# Patient Record
Sex: Male | Born: 1950 | Race: Black or African American | Hispanic: No | Marital: Married | Smoking: Former smoker
Health system: Southern US, Community
[De-identification: ages and names within clinical notes are randomized; demographics above are authoritative.]

## PROBLEM LIST (undated history)

## (undated) DIAGNOSIS — R519 Headache, unspecified: Secondary | ICD-10-CM

## (undated) DIAGNOSIS — M489 Spondylopathy, unspecified: Secondary | ICD-10-CM

## (undated) DIAGNOSIS — K219 Gastro-esophageal reflux disease without esophagitis: Secondary | ICD-10-CM

## (undated) DIAGNOSIS — I219 Acute myocardial infarction, unspecified: Secondary | ICD-10-CM

## (undated) DIAGNOSIS — F329 Major depressive disorder, single episode, unspecified: Secondary | ICD-10-CM

## (undated) DIAGNOSIS — E785 Hyperlipidemia, unspecified: Secondary | ICD-10-CM

## (undated) DIAGNOSIS — I10 Essential (primary) hypertension: Secondary | ICD-10-CM

## (undated) DIAGNOSIS — M1990 Unspecified osteoarthritis, unspecified site: Secondary | ICD-10-CM

## (undated) DIAGNOSIS — I509 Heart failure, unspecified: Secondary | ICD-10-CM

## (undated) DIAGNOSIS — I639 Cerebral infarction, unspecified: Secondary | ICD-10-CM

## (undated) DIAGNOSIS — I739 Peripheral vascular disease, unspecified: Secondary | ICD-10-CM

## (undated) DIAGNOSIS — F32A Depression, unspecified: Secondary | ICD-10-CM

## (undated) HISTORY — DX: Essential (primary) hypertension: I10

## (undated) HISTORY — DX: Gastro-esophageal reflux disease without esophagitis: K21.9

## (undated) HISTORY — DX: Major depressive disorder, single episode, unspecified: F32.9

## (undated) HISTORY — PX: OTHER SURGICAL HISTORY: SHX169

## (undated) HISTORY — DX: Unspecified osteoarthritis, unspecified site: M19.90

## (undated) HISTORY — DX: Heart failure, unspecified: I50.9

## (undated) HISTORY — DX: Hyperlipidemia, unspecified: E78.5

## (undated) HISTORY — DX: Depression, unspecified: F32.A

## (undated) HISTORY — DX: Spondylopathy, unspecified: M48.9

## (undated) HISTORY — PX: CORONARY ARTERY BYPASS GRAFT: SHX141

---

## 2001-03-28 ENCOUNTER — Inpatient Hospital Stay (HOSPITAL_COMMUNITY): Admission: EM | Admit: 2001-03-28 | Discharge: 2001-04-02 | Payer: Self-pay

## 2001-09-12 ENCOUNTER — Emergency Department (HOSPITAL_COMMUNITY): Admission: EM | Admit: 2001-09-12 | Discharge: 2001-09-12 | Payer: Self-pay | Admitting: Emergency Medicine

## 2002-09-12 ENCOUNTER — Encounter: Payer: Self-pay | Admitting: Emergency Medicine

## 2002-09-12 ENCOUNTER — Inpatient Hospital Stay (HOSPITAL_COMMUNITY): Admission: EM | Admit: 2002-09-12 | Discharge: 2002-09-14 | Payer: Self-pay | Admitting: Emergency Medicine

## 2002-09-12 ENCOUNTER — Encounter: Payer: Self-pay | Admitting: Family Medicine

## 2002-09-29 ENCOUNTER — Encounter: Admission: RE | Admit: 2002-09-29 | Discharge: 2002-09-29 | Payer: Self-pay | Admitting: Family Medicine

## 2003-02-24 ENCOUNTER — Emergency Department (HOSPITAL_COMMUNITY): Admission: EM | Admit: 2003-02-24 | Discharge: 2003-02-24 | Payer: Self-pay | Admitting: Emergency Medicine

## 2003-05-16 ENCOUNTER — Encounter: Payer: Self-pay | Admitting: Emergency Medicine

## 2003-05-17 ENCOUNTER — Inpatient Hospital Stay (HOSPITAL_COMMUNITY): Admission: EM | Admit: 2003-05-17 | Discharge: 2003-05-19 | Payer: Self-pay | Admitting: Emergency Medicine

## 2003-05-18 ENCOUNTER — Encounter: Payer: Self-pay | Admitting: Internal Medicine

## 2003-06-28 ENCOUNTER — Emergency Department (HOSPITAL_COMMUNITY): Admission: EM | Admit: 2003-06-28 | Discharge: 2003-06-29 | Payer: Self-pay | Admitting: Emergency Medicine

## 2003-06-29 ENCOUNTER — Encounter: Payer: Self-pay | Admitting: Emergency Medicine

## 2003-07-08 ENCOUNTER — Emergency Department (HOSPITAL_COMMUNITY): Admission: EM | Admit: 2003-07-08 | Discharge: 2003-07-08 | Payer: Self-pay | Admitting: Emergency Medicine

## 2003-07-10 ENCOUNTER — Emergency Department (HOSPITAL_COMMUNITY): Admission: EM | Admit: 2003-07-10 | Discharge: 2003-07-10 | Payer: Self-pay | Admitting: Emergency Medicine

## 2003-08-08 ENCOUNTER — Emergency Department (HOSPITAL_COMMUNITY): Admission: EM | Admit: 2003-08-08 | Discharge: 2003-08-08 | Payer: Self-pay | Admitting: Emergency Medicine

## 2004-02-15 ENCOUNTER — Ambulatory Visit (HOSPITAL_COMMUNITY): Admission: RE | Admit: 2004-02-15 | Discharge: 2004-02-15 | Payer: Self-pay | Admitting: Family Medicine

## 2004-04-26 ENCOUNTER — Ambulatory Visit (HOSPITAL_COMMUNITY): Admission: RE | Admit: 2004-04-26 | Discharge: 2004-04-26 | Payer: Self-pay | Admitting: Family Medicine

## 2004-05-22 ENCOUNTER — Ambulatory Visit: Payer: Self-pay | Admitting: Family Medicine

## 2004-09-26 ENCOUNTER — Encounter: Admission: RE | Admit: 2004-09-26 | Discharge: 2004-09-26 | Payer: Self-pay | Admitting: Cardiovascular Disease

## 2004-09-28 ENCOUNTER — Ambulatory Visit (HOSPITAL_COMMUNITY): Admission: RE | Admit: 2004-09-28 | Discharge: 2004-09-28 | Payer: Self-pay | Admitting: Cardiovascular Disease

## 2004-11-30 ENCOUNTER — Ambulatory Visit (HOSPITAL_COMMUNITY): Admission: RE | Admit: 2004-11-30 | Discharge: 2004-11-30 | Payer: Self-pay | Admitting: Cardiovascular Disease

## 2004-12-17 ENCOUNTER — Encounter: Admission: RE | Admit: 2004-12-17 | Discharge: 2005-01-04 | Payer: Self-pay | Admitting: Pulmonary Disease

## 2005-06-26 ENCOUNTER — Emergency Department (HOSPITAL_COMMUNITY): Admission: EM | Admit: 2005-06-26 | Discharge: 2005-06-26 | Payer: Self-pay | Admitting: Family Medicine

## 2005-09-23 ENCOUNTER — Emergency Department (HOSPITAL_COMMUNITY): Admission: EM | Admit: 2005-09-23 | Discharge: 2005-09-23 | Payer: Self-pay | Admitting: Emergency Medicine

## 2005-09-27 ENCOUNTER — Emergency Department (HOSPITAL_COMMUNITY): Admission: EM | Admit: 2005-09-27 | Discharge: 2005-09-27 | Payer: Self-pay | Admitting: Family Medicine

## 2005-09-30 ENCOUNTER — Emergency Department (HOSPITAL_COMMUNITY): Admission: EM | Admit: 2005-09-30 | Discharge: 2005-09-30 | Payer: Self-pay | Admitting: Family Medicine

## 2006-10-08 ENCOUNTER — Inpatient Hospital Stay (HOSPITAL_COMMUNITY): Admission: EM | Admit: 2006-10-08 | Discharge: 2006-10-10 | Payer: Self-pay | Admitting: Emergency Medicine

## 2006-10-16 ENCOUNTER — Ambulatory Visit (HOSPITAL_COMMUNITY): Admission: RE | Admit: 2006-10-16 | Discharge: 2006-10-16 | Payer: Self-pay | Admitting: Cardiovascular Disease

## 2008-01-10 ENCOUNTER — Encounter: Admission: RE | Admit: 2008-01-10 | Discharge: 2008-01-10 | Payer: Self-pay | Admitting: Sports Medicine

## 2008-05-04 ENCOUNTER — Encounter: Admission: RE | Admit: 2008-05-04 | Discharge: 2008-06-27 | Payer: Self-pay | Admitting: Sports Medicine

## 2008-10-05 ENCOUNTER — Encounter (HOSPITAL_COMMUNITY): Admission: RE | Admit: 2008-10-05 | Discharge: 2008-10-05 | Payer: Self-pay | Admitting: Cardiovascular Disease

## 2008-11-24 ENCOUNTER — Encounter: Admission: RE | Admit: 2008-11-24 | Discharge: 2009-02-22 | Payer: Self-pay | Admitting: Orthopedic Surgery

## 2009-05-29 ENCOUNTER — Emergency Department (HOSPITAL_COMMUNITY): Admission: EM | Admit: 2009-05-29 | Discharge: 2009-05-29 | Payer: Self-pay | Admitting: Emergency Medicine

## 2009-08-11 ENCOUNTER — Emergency Department (HOSPITAL_COMMUNITY): Admission: EM | Admit: 2009-08-11 | Discharge: 2009-08-11 | Payer: Self-pay | Admitting: Emergency Medicine

## 2009-09-04 ENCOUNTER — Ambulatory Visit (HOSPITAL_COMMUNITY): Admission: RE | Admit: 2009-09-04 | Discharge: 2009-09-04 | Payer: Self-pay | Admitting: Cardiovascular Disease

## 2009-09-05 ENCOUNTER — Ambulatory Visit: Payer: Self-pay | Admitting: Thoracic Surgery (Cardiothoracic Vascular Surgery)

## 2009-09-12 ENCOUNTER — Ambulatory Visit: Payer: Self-pay | Admitting: Surgery

## 2009-09-12 ENCOUNTER — Encounter: Payer: Self-pay | Admitting: Thoracic Surgery (Cardiothoracic Vascular Surgery)

## 2009-09-15 ENCOUNTER — Ambulatory Visit: Payer: Self-pay | Admitting: Thoracic Surgery (Cardiothoracic Vascular Surgery)

## 2009-09-15 ENCOUNTER — Inpatient Hospital Stay (HOSPITAL_COMMUNITY)
Admission: RE | Admit: 2009-09-15 | Discharge: 2009-09-19 | Payer: Self-pay | Admitting: Thoracic Surgery (Cardiothoracic Vascular Surgery)

## 2009-09-15 ENCOUNTER — Encounter: Payer: Self-pay | Admitting: Thoracic Surgery (Cardiothoracic Vascular Surgery)

## 2009-10-09 ENCOUNTER — Ambulatory Visit: Payer: Self-pay | Admitting: Thoracic Surgery (Cardiothoracic Vascular Surgery)

## 2009-10-09 ENCOUNTER — Encounter
Admission: RE | Admit: 2009-10-09 | Discharge: 2009-10-09 | Payer: Self-pay | Admitting: Thoracic Surgery (Cardiothoracic Vascular Surgery)

## 2009-10-18 ENCOUNTER — Encounter: Admission: RE | Admit: 2009-10-18 | Discharge: 2009-10-18 | Payer: Self-pay | Admitting: Orthopedic Surgery

## 2009-10-19 ENCOUNTER — Encounter (HOSPITAL_COMMUNITY): Admission: RE | Admit: 2009-10-19 | Discharge: 2010-01-17 | Payer: Self-pay | Admitting: Cardiovascular Disease

## 2010-01-05 ENCOUNTER — Ambulatory Visit (HOSPITAL_COMMUNITY): Admission: AD | Admit: 2010-01-05 | Discharge: 2010-01-05 | Payer: Self-pay | Admitting: Cardiovascular Disease

## 2010-09-21 ENCOUNTER — Emergency Department (HOSPITAL_COMMUNITY)
Admission: EM | Admit: 2010-09-21 | Discharge: 2010-09-21 | Payer: Self-pay | Source: Home / Self Care | Admitting: Emergency Medicine

## 2010-09-24 LAB — POCT CARDIAC MARKERS
CKMB, poc: <1 ng/mL — ABNORMAL LOW (ref 1.0–8.0)
CKMB, poc: <1 ng/mL — ABNORMAL LOW (ref 1.0–8.0)
Myoglobin, poc: 42.1 ng/mL (ref 12–200)
Myoglobin, poc: 55.7 ng/mL (ref 12–200)
Troponin i, poc: <0.05 ng/mL (ref 0.00–0.09)
Troponin i, poc: <0.05 ng/mL (ref 0.00–0.09)

## 2010-09-24 LAB — COMPREHENSIVE METABOLIC PANEL
ALT: 24 U/L (ref 0–53)
AST: 29 U/L (ref 0–37)
Albumin: 3.6 g/dL (ref 3.5–5.2)
Alkaline Phosphatase: 127 U/L — ABNORMAL HIGH (ref 39–117)
BUN: 9 mg/dL (ref 6–23)
CO2: 27 mEq/L (ref 19–32)
Calcium: 9.6 mg/dL (ref 8.4–10.5)
Chloride: 106 mEq/L (ref 96–112)
Creatinine, Ser: 1.47 mg/dL (ref 0.4–1.5)
GFR calc Af Amer: 59 mL/min — ABNORMAL LOW (ref >60)
GFR calc non Af Amer: 49 mL/min — ABNORMAL LOW (ref >60)
Glucose, Bld: 100 mg/dL — ABNORMAL HIGH (ref 70–99)
Potassium: 4.5 mEq/L (ref 3.5–5.1)
Sodium: 140 mEq/L (ref 135–145)
Total Bilirubin: 0.7 mg/dL (ref 0.3–1.2)
Total Protein: 6.2 g/dL (ref 6.0–8.3)

## 2010-09-24 LAB — CBC
HCT: 41 % (ref 39.0–52.0)
Hemoglobin: 12.8 g/dL — ABNORMAL LOW (ref 13.0–17.0)
MCH: 26.4 pg (ref 26.0–34.0)
MCHC: 31.2 g/dL (ref 30.0–36.0)
MCV: 84.5 fL (ref 78.0–100.0)
Platelets: 172 10*3/uL (ref 150–400)
RBC: 4.85 MIL/uL (ref 4.22–5.81)
RDW: 14.4 % (ref 11.5–15.5)
WBC: 4.5 10*3/uL (ref 4.0–10.5)

## 2010-09-29 ENCOUNTER — Encounter: Payer: Self-pay | Admitting: Family Medicine

## 2010-09-30 ENCOUNTER — Encounter: Payer: Self-pay | Admitting: Cardiovascular Disease

## 2010-11-24 LAB — POCT I-STAT 4, (NA,K, GLUC, HGB,HCT)
Glucose, Bld: 109 mg/dL — ABNORMAL HIGH (ref 70–99)
Glucose, Bld: 113 mg/dL — ABNORMAL HIGH (ref 70–99)
Glucose, Bld: 113 mg/dL — ABNORMAL HIGH (ref 70–99)
Glucose, Bld: 125 mg/dL — ABNORMAL HIGH (ref 70–99)
Glucose, Bld: 97 mg/dL (ref 70–99)
HCT: 26 % — ABNORMAL LOW (ref 39.0–52.0)
HCT: 26 % — ABNORMAL LOW (ref 39.0–52.0)
HCT: 36 % — ABNORMAL LOW (ref 39.0–52.0)
Hemoglobin: 11.6 g/dL — ABNORMAL LOW (ref 13.0–17.0)
Hemoglobin: 12.2 g/dL — ABNORMAL LOW (ref 13.0–17.0)
Hemoglobin: 12.2 g/dL — ABNORMAL LOW (ref 13.0–17.0)
Hemoglobin: 9.5 g/dL — ABNORMAL LOW (ref 13.0–17.0)
Potassium: 4.4 mEq/L (ref 3.5–5.1)
Potassium: 5 mEq/L (ref 3.5–5.1)
Potassium: 5.1 mEq/L (ref 3.5–5.1)
Potassium: 5.8 mEq/L — ABNORMAL HIGH (ref 3.5–5.1)
Potassium: 5.9 mEq/L — ABNORMAL HIGH (ref 3.5–5.1)
Sodium: 131 mEq/L — ABNORMAL LOW (ref 135–145)
Sodium: 133 mEq/L — ABNORMAL LOW (ref 135–145)
Sodium: 137 mEq/L (ref 135–145)
Sodium: 141 mEq/L (ref 135–145)

## 2010-11-24 LAB — BASIC METABOLIC PANEL
BUN: 10 mg/dL (ref 6–23)
BUN: 8 mg/dL (ref 6–23)
BUN: 8 mg/dL (ref 6–23)
CO2: 26 mEq/L (ref 19–32)
CO2: 27 mEq/L (ref 19–32)
Calcium: 8.2 mg/dL — ABNORMAL LOW (ref 8.4–10.5)
Calcium: 8.3 mg/dL — ABNORMAL LOW (ref 8.4–10.5)
Chloride: 107 mEq/L (ref 96–112)
Creatinine, Ser: 1.3 mg/dL (ref 0.4–1.5)
Creatinine, Ser: 1.36 mg/dL (ref 0.4–1.5)
GFR calc Af Amer: >60 mL/min (ref >60)
GFR calc non Af Amer: 54 mL/min — ABNORMAL LOW (ref >60)
Glucose, Bld: 124 mg/dL — ABNORMAL HIGH (ref 70–99)
Glucose, Bld: 145 mg/dL — ABNORMAL HIGH (ref 70–99)
Sodium: 140 mEq/L (ref 135–145)

## 2010-11-24 LAB — POCT I-STAT 3, VENOUS BLOOD GAS (G3P V)
Acid-Base Excess: 4 mmol/L — ABNORMAL HIGH (ref 0.0–2.0)
Bicarbonate: 30.5 mEq/L — ABNORMAL HIGH (ref 20.0–24.0)
pH, Ven: 7.357 — ABNORMAL HIGH (ref 7.250–7.300)

## 2010-11-24 LAB — CBC
HCT: 37 % — ABNORMAL LOW (ref 39.0–52.0)
HCT: 39.3 % (ref 39.0–52.0)
Hemoglobin: 10.2 g/dL — ABNORMAL LOW (ref 13.0–17.0)
Hemoglobin: 9.7 g/dL — ABNORMAL LOW (ref 13.0–17.0)
MCHC: 33.2 g/dL (ref 30.0–36.0)
MCHC: 33.4 g/dL (ref 30.0–36.0)
MCHC: 33.8 g/dL (ref 30.0–36.0)
MCV: 84.2 fL (ref 78.0–100.0)
Platelets: 122 10*3/uL — ABNORMAL LOW (ref 150–400)
Platelets: 123 10*3/uL — ABNORMAL LOW (ref 150–400)
Platelets: 148 10*3/uL — ABNORMAL LOW (ref 150–400)
Platelets: 181 10*3/uL (ref 150–400)
Platelets: 99 10*3/uL — ABNORMAL LOW (ref 150–400)
RBC: 3.46 MIL/uL — ABNORMAL LOW (ref 4.22–5.81)
RBC: 4.03 MIL/uL — ABNORMAL LOW (ref 4.22–5.81)
RDW: 14.2 % (ref 11.5–15.5)
RDW: 14.6 % (ref 11.5–15.5)
RDW: 14.9 % (ref 11.5–15.5)
RDW: 15 % (ref 11.5–15.5)
RDW: 15 % (ref 11.5–15.5)
WBC: 11.8 10*3/uL — ABNORMAL HIGH (ref 4.0–10.5)
WBC: 9.9 10*3/uL (ref 4.0–10.5)

## 2010-11-24 LAB — COMPREHENSIVE METABOLIC PANEL
Alkaline Phosphatase: 107 U/L (ref 39–117)
Chloride: 108 mEq/L (ref 96–112)
Creatinine, Ser: 1.25 mg/dL (ref 0.4–1.5)
GFR calc non Af Amer: 59 mL/min — ABNORMAL LOW (ref >60)
Potassium: 4.4 mEq/L (ref 3.5–5.1)
Sodium: 138 mEq/L (ref 135–145)

## 2010-11-24 LAB — BLOOD GAS, ARTERIAL
Drawn by: 206361
FIO2: 0.21 %
Patient temperature: 98.6
pH, Arterial: 7.408 (ref 7.350–7.450)

## 2010-11-24 LAB — CREATININE, SERUM
Creatinine, Ser: 1.24 mg/dL (ref 0.4–1.5)
Creatinine, Ser: 1.36 mg/dL (ref 0.4–1.5)
GFR calc Af Amer: >60 mL/min (ref >60)
GFR calc non Af Amer: 54 mL/min — ABNORMAL LOW (ref >60)
GFR calc non Af Amer: 60 mL/min — ABNORMAL LOW (ref >60)

## 2010-11-24 LAB — POCT I-STAT 3, ART BLOOD GAS (G3+)
Acid-base deficit: 1 mmol/L (ref 0.0–2.0)
Acid-base deficit: 2 mmol/L (ref 0.0–2.0)
Acid-base deficit: 3 mmol/L — ABNORMAL HIGH (ref 0.0–2.0)
Bicarbonate: 22.7 mEq/L (ref 20.0–24.0)
Bicarbonate: 24.4 mEq/L — ABNORMAL HIGH (ref 20.0–24.0)
O2 Saturation: 100 %
O2 Saturation: 100 %
O2 Saturation: 100 %
O2 Saturation: 99 %
Patient temperature: 96.2
Patient temperature: 99.8
TCO2: 26 mmol/L (ref 0–100)
pCO2 arterial: 38.4 mmHg (ref 35.0–45.0)
pH, Arterial: 7.352 (ref 7.350–7.450)
pH, Arterial: 7.385 (ref 7.350–7.450)
pO2, Arterial: 183 mmHg — ABNORMAL HIGH (ref 80.0–100.0)
pO2, Arterial: 350 mmHg — ABNORMAL HIGH (ref 80.0–100.0)

## 2010-11-24 LAB — GLUCOSE, CAPILLARY
Glucose-Capillary: 101 mg/dL — ABNORMAL HIGH (ref 70–99)
Glucose-Capillary: 105 mg/dL — ABNORMAL HIGH (ref 70–99)
Glucose-Capillary: 106 mg/dL — ABNORMAL HIGH (ref 70–99)
Glucose-Capillary: 113 mg/dL — ABNORMAL HIGH (ref 70–99)
Glucose-Capillary: 114 mg/dL — ABNORMAL HIGH (ref 70–99)
Glucose-Capillary: 115 mg/dL — ABNORMAL HIGH (ref 70–99)
Glucose-Capillary: 127 mg/dL — ABNORMAL HIGH (ref 70–99)
Glucose-Capillary: 128 mg/dL — ABNORMAL HIGH (ref 70–99)
Glucose-Capillary: 133 mg/dL — ABNORMAL HIGH (ref 70–99)
Glucose-Capillary: 81 mg/dL (ref 70–99)
Glucose-Capillary: 90 mg/dL (ref 70–99)
Glucose-Capillary: 98 mg/dL (ref 70–99)

## 2010-11-24 LAB — POCT I-STAT, CHEM 8
Creatinine, Ser: 1.2 mg/dL (ref 0.4–1.5)
HCT: 27 % — ABNORMAL LOW (ref 39.0–52.0)
Hemoglobin: 9.2 g/dL — ABNORMAL LOW (ref 13.0–17.0)
Potassium: 4.3 mEq/L (ref 3.5–5.1)
Sodium: 140 mEq/L (ref 135–145)
TCO2: 26 mmol/L (ref 0–100)

## 2010-11-24 LAB — URINALYSIS, ROUTINE W REFLEX MICROSCOPIC
Bilirubin Urine: NEGATIVE
Glucose, UA: NEGATIVE mg/dL
Hgb urine dipstick: NEGATIVE
Specific Gravity, Urine: 1.02 (ref 1.005–1.030)
Urobilinogen, UA: 0.2 mg/dL (ref 0.0–1.0)

## 2010-11-24 LAB — APTT: aPTT: 29 seconds (ref 24–37)

## 2010-11-24 LAB — PROTIME-INR
INR: 0.87 (ref 0.00–1.49)
Prothrombin Time: 15.1 seconds (ref 11.6–15.2)

## 2010-11-24 LAB — ABO/RH: ABO/RH(D): O POS

## 2010-11-24 LAB — HEMOGLOBIN AND HEMATOCRIT, BLOOD: HCT: 27.4 % — ABNORMAL LOW (ref 39.0–52.0)

## 2010-11-24 LAB — TYPE AND SCREEN
ABO/RH(D): O POS
Antibody Screen: NEGATIVE

## 2010-11-24 LAB — HEMOGLOBIN A1C

## 2010-11-24 LAB — MRSA PCR SCREENING: MRSA by PCR: NEGATIVE

## 2010-11-24 LAB — MAGNESIUM: Magnesium: 2.7 mg/dL — ABNORMAL HIGH (ref 1.5–2.5)

## 2010-11-27 LAB — POCT I-STAT, CHEM 8
Chloride: 105 mEq/L (ref 96–112)
Glucose, Bld: 95 mg/dL (ref 70–99)
HCT: 41 % (ref 39.0–52.0)
Potassium: 4.2 mEq/L (ref 3.5–5.1)
Sodium: 142 mEq/L (ref 135–145)

## 2010-12-11 LAB — COMPREHENSIVE METABOLIC PANEL
ALT: 21 U/L (ref 0–53)
AST: 19 U/L (ref 0–37)
Alkaline Phosphatase: 113 U/L (ref 39–117)
CO2: 26 mEq/L (ref 19–32)
Chloride: 107 mEq/L (ref 96–112)
GFR calc non Af Amer: 57 mL/min — ABNORMAL LOW (ref >60)
Glucose, Bld: 91 mg/dL (ref 70–99)
Potassium: 3.8 mEq/L (ref 3.5–5.1)
Sodium: 138 mEq/L (ref 135–145)
Total Bilirubin: 0.5 mg/dL (ref 0.3–1.2)

## 2010-12-11 LAB — URINALYSIS, ROUTINE W REFLEX MICROSCOPIC
Glucose, UA: NEGATIVE mg/dL
Hgb urine dipstick: NEGATIVE
Ketones, ur: NEGATIVE mg/dL
Protein, ur: NEGATIVE mg/dL
pH: 6 (ref 5.0–8.0)

## 2010-12-11 LAB — DIFFERENTIAL
Basophils Relative: 1 % (ref 0–1)
Eosinophils Absolute: 0.1 10*3/uL (ref 0.0–0.7)
Eosinophils Relative: 2 % (ref 0–5)
Neutrophils Relative %: 51 % (ref 43–77)

## 2010-12-11 LAB — POCT CARDIAC MARKERS
CKMB, poc: 1.2 ng/mL (ref 1.0–8.0)
Myoglobin, poc: 83.7 ng/mL (ref 12–200)
Troponin i, poc: <0.05 ng/mL (ref 0.00–0.09)

## 2010-12-11 LAB — CBC
Hemoglobin: 14.7 g/dL (ref 13.0–17.0)
MCHC: 32.8 g/dL (ref 30.0–36.0)
RBC: 5.29 MIL/uL (ref 4.22–5.81)
WBC: 5.1 10*3/uL (ref 4.0–10.5)

## 2010-12-11 LAB — ETHANOL: Alcohol, Ethyl (B): <5 mg/dL (ref 0–10)

## 2010-12-11 LAB — RAPID URINE DRUG SCREEN, HOSP PERFORMED
Barbiturates: NOT DETECTED
Opiates: NOT DETECTED
Tetrahydrocannabinol: NOT DETECTED

## 2011-01-22 NOTE — Assessment & Plan Note (Signed)
OFFICE VISIT   AVID, GUILLETTE  DOB:  1951-06-27                                        October 09, 2009  CHART #:  [id number]   REASON FOR OFFICE VISIT:  Routine follow up status post CABG.   HISTORY OF PRESENT ILLNESS:  This is a 60 year old African American male  who is status post CABG x3 on September 15, 2009.  He had a fairly  uneventful postoperative course and was discharged on September 18, 2009.  He returns to the office today for routine follow up.  The patient  denies any shortness of breath, chest pain, fever, or chills.  He does  noted neck and back pain which he has a history of and is going to  return for follow up with his medical doctor regarding this.  He also  notes some decreased appetite, but denies any abdominal pain,  nauseousness, or emesis.   PHYSICAL EXAMINATION:  General:  This is a pleasant 60 year old [place]  American male who is in no acute distress who is alert, warm,  cooperative.  Vital Signs:  BP 135/80, pulse rate 89, respirations 18,  O2 sat 98%.  Cardiovascular:  Regular rate and rhythm.  No murmurs,  gallops, or rubs.  Pulmonary:  Clear to auscultation bilaterally.  No  rales, wheezes, or rhonchi.  Abdomen:  Soft and nontender.  Bowel sounds  present.  Extremities:  Trace lower extremity edema bilaterally.  Sternal wound clean, dry, well healed.  Two scabs removed from former  chest tube site.  Right lower extremity wound clean and dry.   DIAGNOSTIC TESTS:  Chest x-ray shows no pneumothorax.  No pleural  effusions.  Lungs, mostly clear,. minimal left lower lobe atelectasis.   IMPRESSION AND PLAN:  Overall, the patient is surgically stable.  There  have been no changes to his medications since discharge.  He will  continue to be followed closely by Dr. Algie Coffer.  He does have an  appointment within the next week to see him.  The patient was instructed  he needs to continue with sternal precautions for at least 3-4 more  weeks.  He was also  instructed he may begin driving short distance provided he is not taking  any oral narcotics.  The patient was contacted by [facility], but he  wishes to decline at this time.   Salvatore Decent. Cornelius Moras, M.D.  Electronically Signed   DZ/MEDQ  D:  10/09/2009  T:  10/10/2009  Job:  [id number]   cc:   Ricki Rodriguez, M.D.

## 2011-01-22 NOTE — H&P (Signed)
HISTORY AND PHYSICAL EXAMINATION   September 05, 2009   Re:  JARIOUS, LYON         DOB:  April 20, 1951   REASON FOR CONSULTATION:  Left main disease with three-vessel coronary  artery disease.   HISTORY OF PRESENT ILLNESS:  The patient is a 60 year old African  American male from [place] with history of coronary artery disease,  hypertension, hypercholesterolemia, and remote history of tobacco abuse.  The patient's cardiac history dates back to 2002, at which time he  presented with acute coronary syndrome and a non-ST-segment elevation  myocardial infarction.  Catheterization was performed and the patient  was treated with percutaneous coronary intervention and stenting of the  left circumflex coronary artery.  The patient did well although he has  undergone 2 previous repeat catheterizations in 2006 and 2008 for  symptoms of coronary artery disease.  He has been noted to have  progression of multivessel coronary artery disease, but for the most  part with distal vessel disease not amendable to revascularization.  The  patient has also had continued problems with chronic back and neck pain  that has severely limited his physical activity.  Recently, the patient  has had some increasing episodes of pain across the shoulders and across  his chest that seem to come and go sporadically.  The patient has some  progression of exertional shortness of breath.  He was seen in followup  by Dr. Algie Coffer and a nuclear stress test was performed demonstrating  some suggestion of inferior wall ischemia.  The patient subsequently  underwent elective cardiac catheterization yesterday by Dr. Algie Coffer.  The patient was found to have distal left main disease with three-vessel  coronary artery disease and coronary anatomy relatively unfavorable for  percutaneous coronary intervention.  Left ventricular function is mildly  reduced.  The patient has been referred to consider elective  surgical  revascularization.   REVIEW OF SYSTEMS:  GENERAL:  The patient reports normal appetite.  He  has not been gaining nor losing weight recently.  VITAL SIGNS:  He is 5 feet 7 inches tall and weighs approximately 180  pounds.  CARDIAC:  Notable for some atypical symptoms of pain radiating across  his chest that seem to come and go sporadically.  The patient did have  an episode of chest pain that lasted nearly 20 minutes yesterday  evening.  He has never tried using nitroglycerin spray.  The chest pain  is somewhat atypical and described as intermittent and sometimes sharp.  The pain is not necessarily associated with shortness of breath.  The  patient has had some numbness and tingling involving both hands, but he  also has chronic numbness and tingling afflicting both arms and both  lower legs that has been attributed to degenerative joint disease and  spinal stenosis.  The patient has stable chronic exertional shortness of  breath.  The patient denies resting shortness of breath, PND, orthopnea,  or lower extremity edema.  He has not had palpitations or syncope.  RESPIRATORY:  Negative.  The patient denies productive cough,  hemoptysis, or wheezing.  GASTROINTESTINAL:  Negative.  The patient has no difficulty swallowing.  He denies hematochezia, hematemesis, or melena.  GENITOURINARY:  Negative.  MUSCULOSKELETAL:  Notable for chronic pain that seem to come and go  sporadically afflicting both shoulders, both arms, his back, his neck,  and both lower legs.  This is associated with numbness and tingling  afflicting both arms and both legs.  All symptoms  seem to be somewhat  sporadic and bilateral.  The symptoms cause chronic pain and had  severely limited this patient's physical ability and mobility.  HEENT:  Negative.  INFECTIOUS:  Negative.   PAST MEDICAL HISTORY:  1. Coronary artery disease status post myocardial infarction in 2002,      status post percutaneous coronary  intervention and stenting of the      left circumflex coronary artery.  2. Hypertension.  3. Hypercholesterolemia  4. Chronic back pain with chronic intermittent numbness afflicting      both upper extremities and both lower extremities as lower back.   PAST SURGICAL HISTORY:  Excision of benign breast lump in the distant  past.   FAMILY HISTORY:  Notable in that both the patient's parents had coronary  artery disease at a relatively young age.   SOCIAL HISTORY:  The patient is married, but currently lives with his  father.  He has been disabled secondary to his chronic back trouble.  He  has 6 children.  He has a long history of tobacco use, although he quit  smoking nearly 3 years ago.  Prior to that, he smoked in excess of 1  pack of cigarettes daily for many years.  The patient denies excessive  alcohol consumption.   CURRENT MEDICATIONS:  1. Zocor 20 mg daily.  2. Imdur 15 mg daily.  3. Flexeril 5 mg twice daily as needed.  4. Coenzyme Q10 1 tablet daily.  5. Metoprolol 12.5 mg twice daily.  6. Plavix 75 mg daily.  7. Nitroglycerin sublingual spray as needed.  8. Oxycodone as needed for pain.  9. Combivent inhaler twice daily.  10.Vitamin B complex daily.   DRUG ALLERGIES:  Penicillin has caused rash.   PHYSICAL EXAMINATION:  The patient is a well-appearing male who appears  his stated age, in no acute distress.  Blood pressure 124/77, pulse 74,  and oxygen saturation 95% on room air.  HEENT exam is grossly  unrevealing.  The neck is supple.  There is no palpable cervical or  supraclavicular lymphadenopathy.  There are no carotid bruits.  Auscultation of the chest demonstrates clear breath sounds, which are  symmetrical bilaterally.  No wheezes or rhonchi noted.  Cardiovascular  exam includes regular rate and rhythm.  No murmurs, rubs, or gallops are  appreciated.  The abdomen is soft and nontender.  Bowel sounds are  present.  There are no palpable masses.  The  extremities are warm and  well perfused.  There is no lower extremity edema.  Distal pulses are  palpable in the posterior tibial position bilaterally.  The skin is  clean, dry, and healthy appearing.  Rectal and GU exams are both  deferred.  Neurologic examination is grossly nonfocal and symmetrical  throughout.   DIAGNOSTIC TESTS:  Cardiac catheterization performed by Dr. Algie Coffer  yesterday is reviewed.  This demonstrates 50-60% stenosis of the distal  left main coronary artery extending into 80% ostial stenosis of the left  anterior descending coronary artery.  This lesion is heavily calcified.  There is 60-70% stenosis of the mid left anterior descending coronary  artery just after takeoff of a small second diagonal branch.  There is a  high ramus intermediate branch or first diagonal which is not well  visualized, but appears to have ostial plaque that is probably at least  70% blocked.  The left circumflex coronary artery gives rise to a large  first obtuse marginal branch that has 60% proximal stenosis.  There is  80% stenosis of the distal left circumflex coronary artery after takeoff  of the first obtuse marginal branch.  The second obtuse marginal branch  is very small.  There is codominant coronary circulation with perhaps  50% stenosis of the mid right coronary artery.  The distal right  coronary artery branches are small and somewhat diffusely diseased.  Left ventricular function is mildly reduced with ejection fraction  estimated 50%.   IMPRESSION:  Left main disease with three-vessel coronary artery disease  and mild left ventricular dysfunction.  The patient has atypical chest  pain, but symptoms suggestive of angina pectoris.  I agree that he would  probably best be treated with surgical revascularization.   PLAN:  I have discussed options at length with the patient here in the  office today.  Alternative treatment strategies have been discussed.  He  understands and  accepts all potential associated risks of surgery  including, but not limited to risk of death, stroke, myocardial  infarction, congestive heart failure, respiratory failure, pneumonia,  bleeding requiring blood transfusion, arrhythmia, infection, and  recurrent coronary artery disease.  He has been instructed to stop  taking Plavix.  He has been instructed to use sublingual nitroglycerin  should he suffer any further episodes of substernal chest discomfort,  and he should present to the emergency room emergently should he have  any chest pain that is unrelieved by nitroglycerin.  We tentatively plan  to proceed with surgery on Thursday, September 14, 2009.  All of his  questions have been addressed.   Salvatore Decent. Cornelius Moras, M.D.  Electronically Signed   CHO/MEDQ  D:  09/05/2009  T:  09/06/2009  Job:  [id number]   cc:   Ricki Rodriguez, M.D.  Mina Marble, M.D.

## 2011-01-25 NOTE — Consult Note (Signed)
[facility]  Patient:    Marcus [name], Marcus [name]                      MRN: [id number] Proc. Date: 03/31/01 Adm. Date:  [id number] Attending:  Lyn Records. Iii CC:         Darci Needle, M.D.  [facility]   Consultation Report  REASON FOR CONSULTATION:  Two-vessel coronary disease, consider bypass surgery.  CHIEF COMPLAINT:  Marcus [name] is a 60 year old African-American male who presented with a chief complaint of substernal chest pressure.  HISTORY OF PRESENT ILLNESS:  Marcus [name] is a 60 year old African-American gentleman with no prior cardiac history, although he did have episodes of chest pain requiring emergency room visit 2-3 years ago.  At that time, his workup was negative and his pain eventually resolved.  Approximately a week ago, he began having waxing and waning substernal chest discomfort.  On the morning of March 28, 2001, it became severe and persisted and he came to the emergency department.  He was found to have ST and T wave changes on his EKG and was taken to the cardiac catheterization lab, where he underwent emergent catheterization.  It revealed a near-total occlusion of his left circumflex distally with TIMI-1 flow.  He underwent a successful percutaneous intervention with angioplasty with residual stenosis of less than 40%.  He did rule in for myocardial infarction with peak CK of 1249 and peak MB of 145. The plan at that point was to treat him medically, given two-vessel disease with moderate LAD involvement, although he did have severe stenoses in the first diagonal and ramus intermedius.  On July 22, he developed recurrent chest pain, which was 7/10 and the same character as his initial pain.  He was taken back to the catheterization lab emergently, where the distal circumflex PCI site was patent.  There was interval dissection but good flow.  The remainder of the coronary anatomy was as the previous  catheterization.  The patients symptoms resolved with intravenous heparin and nitroglycerin. Currently, this morning he is pain free.  PAST MEDICAL HISTORY:  Chronic low-back pain secondary to disk disease.  He has not had surgery.  He denies hypertension, diabetes, and hypercholesterolemia.  MEDICATIONS ON ADMISSION:  None.  Currently on IV heparin, nitroglycerin, and Integrilin.  ALLERGIES:  No known drug allergies.  FAMILY HISTORY:  His father had three MIs.  HABITS:  He was a smoker.  He quit approximately three weeks ago.  REVIEW OF SYSTEMS:  He has no stroke, TIA, or seizure history or symptoms.  He has no history of bleeding, bruising easily.  No history of DVT or PE.  He denies orthopnea, PND, and peripheral edema.  He has no asthma or wheezing. He has an occasional cough, usually nonproductive.  All other systems are negative.  PHYSICAL EXAMINATION:  GENERAL:  Marcus [name] is a well-developed, well-nourished 60 year old African-American male in no acute distress.  VITAL SIGNS:  Blood pressure 105/70, pulse 64 and regular, respirations 16.  NEUROLOGIC:  He is alert and oriented x 3 and grossly intact.  HEENT:  Remarkable only for poor dentition.  NECK:  Supple without thyromegaly, adenopathy, or bruits.  CHEST:  Clear to auscultation and percussion with no rales or wheezing.  CARDIAC:  Regular rate and rhythm.  Normal S1 and S2.  There is an S4.  There are no murmurs.  ABDOMEN:  Soft and nontender.  EXTREMITIES:  2+ pulses throughout.  There is no clubbing, cyanosis, or edema.  SKIN:  Intact.  LABORATORY DATA:  EKG shows T wave abnormalities laterally with biphasic T waves.  Peak CPK and MB were 1249 and 145, respectively.  His peak troponin was 6.6. Hematocrit is 36, platelet count 190, white count 4.8.  PTT was normal on admission.  PT was 12.3 on admission.  Electrolytes within normal limits.  BUN was 5 and creatinine 1.0.  Albumin was 3.3.  Chest  x-ray shows no active disease.  IMPRESSION:  Marcus [name] is a 60 year old African-American male who presented with unstable coronary syndrome.  He ruled in for myocardial infarction secondary to subtotal occlusion of his distal left circumflex.  This was angioplastied with a good result with a less than 40% residual stenosis.  He then was planned to be treated with medical therapy; however, he developed recurrent pain and required recatheterization yesterday, at which time the percutaneous transluminal coronary angioplasty site was noted to be patent. He has two-vessel disease.  His right coronary is not involved.  He has moderate disease in his left anterior descending and tight disease in a diagonal branch.  He also has complex tight disease in the ramus intermedius and mild to moderate disease in his first obtuse marginal, which is a large branch at the circumflex.  Given his moderate left anterior descending disease, the ideal treatment at this point in time would be medical therapy, if he can tolerate it. Certainly, if bypass surgery was done, the left anterior descending would be grafted, but I am concerned about long-term patency of a graft because of the only moderate disease in that vessel and it might not be flow limiting enough to maintain an open graft on that vessel.  I think certainly at some point in time he will require bypass surgery, as the disease in the left anterior descending is likely to progress.  Furthermore, although the diagonal and ramus are graftable, they are not huge targets and I think long-term patency of vein grafts to that location are in question.  He requires too many grafts to do them all with arterial grafting.  I agree with Dr. Katrinka Blazing that percutaneous coronary intervention is unlikely to be of significant benefit, given the complex nature of the ramus lesion and inability to do stenting in that location.   After discussion with Dr. Katrinka Blazing and the  patient, my recommendation at this point would be to give an additional try with medical therapy to see if his symptoms can be controlled.  If he has recurrent pain at this point, then I would recommend bypass surgery for relief of symptoms, even though there may be limited survival benefit with the surgery.  I have discussed these issues in detail with both Mr. Beckles and Dr. Verdis Prime and we are all in agreement with this plan at this point in time.  I will continue to follow Mr. Pickar while he is in the hospital.  Again, if he develops recurrent pain, then we would plan on doing bypass surgery at that point in time. DD:  03/31/01 TD:  03/31/01 Job: [id number]

## 2011-01-25 NOTE — H&P (Signed)
NAME:  Marcus [name], Marcus [name]                         ACCOUNT NO.:  [id number]   MEDICAL RECORD NO.:  [id number]                   PATIENT TYPE:  EMS   LOCATION:  [facility]                                 FACILITY:  [facility]   PHYSICIAN:  Corinna L. Lendell Caprice, MD             DATE OF BIRTH:  1950-10-03   DATE OF ADMISSION:  05/16/2003  DATE OF DISCHARGE:                                HISTORY & PHYSICAL   CHIEF COMPLAINT:  Chest pain.   HISTORY OF PRESENT ILLNESS:  Mr. Agosto is a 60 year old black male with a  history of myocardial infarction and angiography in 2002 who presents today  with complaints of atypical chest pain.  He reports that he has been having  chest pain about every other day periodically for the past year-and-a-half;  however, the pain was worse today.  It was 10/10.  It started Integrelin the  right chest, was sharp and radiated to his arm.  It subsequently became  pressure-like and lasted about an hour.  It was improved with morphine and  nitroglycerin, but he reports that it is coming back.  He had been on beta  blocker, aspirin, statin and ACE inhibitor previously, but has been unable  to afford his meds.  He also felt lightheaded today.  Over the weekend he  felt heartburn, which was different than today.  No cough.  No shortness of  breath.  He had a urine drug screen positive for cocaine last January when  he was treated for pneumonia, but he vehemently denies using this recently.  He also has quit smoking since then.   PAST MEDICAL HISTORY:  1. Coronary artery disease status post myocardial infarction in 2002 with a     preserved ejection fraction; and, angiography of the circumflex.  A trial     of medical therapy was recommended by cardiovascular thoracic surgery,     but the option of bypass would be a possibility if patient continued to     have symptoms according to records in 2002.  2. History of tobacco abuse; quit recently.  3. Hyperlipidemia.  4. History of  cocaine use.   FAMILY HISTORY:  The patient's mother has kidney problems and  hypertension.  His father had an MI in his 36s.   SOCIAL HISTORY:  To denies smoking, drinking  and drugs. He is a Copy.   REVIEW OF SYMPTOMS:  CONSTITUTIONAL:  No fevers, chills or weight loss.  HEENT: No headaches, sore throat or rhinorrhea.  RESPIRATORY: No cough or  shortness of breath.  CARDIOVASCULAR: As above.  GASTROINTESTINAL: The  patient has occasional scant bleeding from hemorrhoids.  No history of GI  bleed otherwise.  No history of ulcers.  No nausea, vomiting or diarrhea.  GENITOURINARY:  No hematuria.  No dysuria.  MUSCULOSKELETAL: No arthralgias  or myalgias,  SKIN: No rash.  PSYCHIATRIC:  No depression.  NEUROLOGIC:  No  seizures.  PHYSICAL EXAMINATION:  VITAL SIGNS:  The patient's oxygen saturation is 93%  on room air.  Blood pressure is 132/91.  Heart rate 92.  Temperature 98  degrees.  Respiratory rate 18.  IN GENERAL:  The patient is a thin black male in no acute distress.  HEENT:  Pupils are equal, round and react to light.  Extraocular movements  are intact.  Sclerae nonicteric.  Moist mucous membranes.  Poor dentition.  Oropharynx is without erythema or exudate.  NECK:  Neck is supple.  No lymphadenopathy.  No thyromegaly or jugular  venous distention.  LUNGS:  Lungs are clear to auscultation bilaterally without wheezes, rhonchi  or rales.  CARDIOVASCULAR:  Regular rate and rhythm without murmurs, gallops or rubs.  ABDOMEN:  Normal bowel sounds.  Soft.  Nontender and nondistended.  GENITOURINARY AND RECTAL: Deferred.  EXTREMITIES:  No clubbing, cyanosis or edema.  Pulses are intact.  SKIN:  No rash.  PSYCHIATRIC:  No affect.  NEUROLOGIC:  Alert and oriented.  Cranial nerves, sensory and motor  examinations are intact.   LABORATORY DATA:  CBC and basic metabolic panel are within normal limits.  CPK is 354, MB fraction is 2.4 and relative index is 0.7.  Troponin I is  0.01.   Chest x-ray, PA and lateral, negative to my reading; official report  is pending.  EKG shows normal sinus rhythm with nonspecific changes,  unchanged from previous EKG.   ASSESSMENT/PLAN:  1. Atypical chest pain.   Given patient's history of coronary artery disease the patient will be  admitted to telemetry.  We will rule out MI, given aspirin, oxygen,  nitroglycerin, and Lovenox.  If he does, in fact, rule out for an MI he will  most likely need a Cardiolite.   1. Hyperlipidemia.   I will repeat his fasting lipids in the morning.   1. History of cocaine use.   I will check a urine drug screen.   1. History of tobacco abuse, recently quit.                                                  Corinna L. Lendell Caprice, MD    CLS/MEDQ  D:  05/16/2003  T:  05/17/2003  Job:  [id number]

## 2011-01-25 NOTE — H&P (Signed)
NAME:  Marcus [name], Marcus [name] NO.:  [id number]   MEDICAL RECORD NO.:  [id number]          PATIENT TYPE:  INP   LOCATION:  6527                         FACILITY:  [facility]   PHYSICIAN:  Ricki Rodriguez, M.D.  DATE OF BIRTH:  Sep 04, 1951   DATE OF ADMISSION:  10/08/2006  DATE OF DISCHARGE:                              HISTORY & PHYSICAL   CHIEF COMPLAINT:  Chest pain.   HISTORY OF PRESENT ILLNESS:  This 60 year old black male has a one-day  history of substernal chest pain with shortness of breath without  sweating spell.  Chest pain is nonradiating and not relieved by IV  nitroglycerin.   PAST MEDICAL HISTORY:  Negative for diabetes.  Positive for  hypertension.  Positive for myocardial infarction x1, stent placement in  left circumflex coronary artery in 2002, cardiac catheterization  repeated in 2006 with moderate coronary artery disease.   MEDICATIONS:  The patient ran out of his medications.   ALLERGIES:  None.   PERSONAL HISTORY:  The patient quit smoking two weeks ago.  Has a  history of 40-pack year smoking and a 6-pack of beer per month for many  years.  Denies drug abuse.  He is a Copy at [employer].   FAMILY HISTORY:  Mother living at age 75.  Father living at age 30, had  MI in his 46s.  The patient has 7 brothers and 4 sisters.   REVIEW OF SYSTEMS:  The patient denies fevers, chills, weight loss.  No  headache, sore throat, no cough.  Occasional bleeding from hemorrhoids.  No history of other GI bleed.  No history of ulcers, nausea, vomiting or  diarrhea.  No hematuria or kidney stone.  No skin rash.  No psychiatric  admissions.  No strokes or seizures.   PHYSICAL EXAMINATION:  Pulse 76, respirations 16, blood pressure 96/56,  temperature 97.4, oxygen saturation 96%.  The patient is a well built,  averagely nourished black male in no acute distress.  HEENT:  The patient is normocephalic, atraumatic.  Has brown eyes.  Pupils equal and reactive to  light.  Ear, nose, throat grossly  unremarkable.  NECK:  No JVD, no carotid bruit.  LUNGS:  Clear bilaterally.  HEART:  Normal S1-S2 without S3 or gallop.  ABDOMEN:  Soft and nontender.  EXTREMITIES:  No edema, cyanosis, clubbing.  NEUROLOGICAL:  Cranial nerves grossly intact.   LABORATORY DATA:  Hemoglobin 13.9, hematocrit 41.  Normal electrolytes,  BUN 10, creatinine 1.1, myoglobin and CK-MB normal.  Troponin borderline  at 0.19.   IMPRESSION:  Unstable angina.   Plan is to admit the patient to telemetry unit, give nitroglycerin, IV  heparin, aspirin and Plavix and schedule the patient for cardiac  catheterization.      Ricki Rodriguez, M.D.  Electronically Signed     ASK/MEDQ  D:  10/09/2006  T:  10/09/2006  Job:  [id number]

## 2011-01-25 NOTE — Discharge Summary (Signed)
[place]  Patient:    Marcus [name], Marcus [name].                     MRN: [id number] Adm. Date:  03/28/01 Disc. Date: 04/02/01 Attending:  Celso Sickle, M.D. Dictator:   Anselm Lis, N.P. CC:         Salvatore Decent. Dorris Fetch, M.D.  [facility]   Discharge Summary  PRIMARY CARE:  [employer].  PROCEDURES: 1. March 28, 2001, angioplasty of 99% circumflex to less than 40%, residual    90% occlusion of a large ramus, and 60-70% circumflex after first obtuse    marginal. 2. March 28, 2001, diagnostic coronary angiography for recurrent chest pain:    Patent infarct lesion.  Angina could be from distal circumflex (dissection    after percutaneous coronary intervention), ramus, or diagonal.  None of    these areas ideal for percutaneous intervention due to small size and    branch point location.  CONSULTANTS:  Salvatore Decent. Dorris Fetch, M.D., [facility].  DISCHARGE DIAGNOSES: 1. Coronary atherosclerotic heart disease:  The patient is a pleasant    60 year old gentleman with a history of stuttering substernal chest    discomfort which became severe and persistent, prompting his appearance at    the [facility].  He was found to have ST-T wave    changes in his EKG and was taken emergently to cardiac catheterization    where he underwent successful angioplasty of a near total occlusion of his    left circumflex distally with residual stenosis of less than 40%.  He did    rule in for myocardial infarction with a peak CK of 1249 and a peak MB of    145.  He has residual 50-70% mid left anterior descending and    90% diagonal 1, as well as 60-70% circumflex after the first obtuse    marginal.  Right coronary artery with luminal irregularities only.  Left    ventriculogram revealed question of inferior hypokinesis with an ejection    fraction than 70%.  On March 30, 2001, he developed recurrent chest    discomfort, which  was 7/10, and the same character of his initial pain.  He    was taken back to the cardiac catheterization laboratory emergently where    the distal circumflex percutaneous coronary intervention site was patent.    There was interval dissection, but good flow.  The remainder of the    coronary anatomy was as the previous catheterization.  The patients    symptoms resolved with intravenous heparin and nitroglycerin and he    remained pain-free during the rest of the hospital admission.  A CVTS    consult was obtained by Salvatore Decent. Dorris Fetch, M.D.  Per Dr. Rondel Jumbo    recommendation, it was decided to give additional trial with medical    therapy to see if his symptoms could be controlled.  If he developed    recurrent pain, then Dr. Dorris Fetch would recommend CABG for relief of    symptoms even though there may be limited survival benefits with the    surgery.  These issues were discussed by all concerned parties and    everyone, including Dr. Dorris Fetch, Dr. Katrinka Blazing, and the patient were in    agreement. 2. Chronic low back pain.  Management via [facility]. 3. Financial constraints. 4. Social service consult obtained.  The patient does follow with [facility]  [employer], but was unable to obtain disability benefits and in the past    has not been able to get recertified at [facility] because he needed a    letter from his prior job and was unable to obtain it.  Will arrange for    filling of discharge prescriptions at the time of discharge with office    samples as we aer able to provide.  DISCHARGE PLAN: 1. The patient will be discharged home in fair condition. 2. Discharge medications:    a. Enteric-coated aspirin 325 mg once daily.    b. Lopressor 50 mg one half tablet b.i.d.    c. Altace 2.5 mg p.o. q.d.    d. Lipitor 10 mg p.o. q.d.    e. Nitroglycerin tablets 0.4 mg sublingual every five minutes p.r.n. chest       pain, a total of three tablets in 15 minutes.    f.  Nitroglycerin patch 0.3 mg/hr, apply each night at 8 p.m. and remove       each afternoon at 4 p.m. 3. Activity:  As tolerated. 4. Diet:  Low fat, low cholesterol. 5. Special instructions:  Call our clinic for questions [phone]). 6. Follow-up:  Celso Sickle, M.D., on April 10, 2001, at 10 a.m.  LABORATORY TESTS AND DATA:  The cholesterol profile revealed a cholesterol of 122, triglycerides 105, HDL 38, and LDL 63.  Sodium 142, K 3.9, chloride 105, CO2 21, glucose 93, BUN 7, creatinine 1.1.  WBC 4.3, hemoglobin 12, hematocrit 35.1, platelets 174.  Admission coagulation times were within normal range.  Serial cardiac enzymes:  First CK 543, MB fraction 80.2, troponin I 2.37.  Second CK 1199 with MB fraction 183.4.  Third CK 1249 with MB fraction 145.4.  Fifth CK 255, MB fraction 6.2, and troponin I 6.65.  The chest x-ray revealed no acute process.  Admission EKG:  0.5 mm ST elevation in inferior leads.  Note that the total time of preparing this discharge was greater than 30 minutes, including dictating this discharge summary, filling out the patients discharge instructions, and explaining of instructions, as well as correspondence with the Child psychotherapist. DD:  04/02/01 TD:  04/02/01 Job: [id number]

## 2011-01-25 NOTE — Cardiovascular Report (Signed)
NAME:  Marcus, [name] NO.:  [id number]   MEDICAL RECORD NO.:  [id number]          PATIENT TYPE:  INP   LOCATION:  6527                         FACILITY:  [facility]   PHYSICIAN:  Ricki Rodriguez, M.D.  DATE OF BIRTH:  06/23/1951   DATE OF PROCEDURE:  10/09/2006  DATE OF DISCHARGE:                            CARDIAC CATHETERIZATION   PROCEDURE:  Left heart catheterization.  Coronary angiography, left  ventricular function study.   INDICATIONS:  This 60 year old black male with a known coronary artery  disease had chest pain and abnormal cardiac enzymes along with other  risk factors of hypertension, myocardial infarction.  The patient also  admitted to running out of medication.   APPROACH:  Right femoral artery using 4-French sheath and catheters.   COMPLICATIONS:  None.   HEMODYNAMIC DATA:  The left ventricular pressure was 107/7 and aortic  pressure was 100/56.  Left ventriculogram:  The left ventriculogram  showed ejection fraction of 55% with the base and inferior hypokinesia.   CORONARY ANATOMY:  The left main coronary artery showed luminal  irregularities and it was long vessel with mild calcification.   Left anterior descending coronary artery.  The left anterior descending  coronary artery also showed mild calcification, had proximal eccentric  50% lesion involving the origin of the diagonal vessel and septal  perforators.  The distal vessel was unremarkable.  Diagonal 1 vessel had  ostial 60% narrowing but it was less than 1.5 mm in diameter.   Left circumflex coronary artery.  The left circumflex coronary artery  had proximal luminal irregularities.  Mid vessel haziness with 20-30%  lesion.  Its ramus branch was unremarkable.  Obtuse marginal 1 had  proximal luminal irregularities.  Obtuse marginal branch 2 was  unremarkable and supplied collateral to posterior descending coronary  artery which is less than 1.5 mm in size.   Right coronary artery.   The right coronary artery was dominant, had  proximal luminal irregularities, distal 95% stenosis with a diffuse  narrowing of the posterior descending coronary artery, again less than  1.5 mm in size and had a posterolateral branch which was very small  vessel with diffuse disease.  The marginal vessel was a larger vessel  with mild proximal disease.   IMPRESSION:  1. Native vessel multivessel coronary artery disease with severe right      coronary artery disease.  2. Mild left ventricular systolic dysfunction.  3. Medication noncompliance.   RECOMMENDATIONS:  This patient will continue medical therapy due to  small target vessels.      Ricki Rodriguez, M.D.  Electronically Signed     ASK/MEDQ  D:  10/09/2006  T:  10/09/2006  Job:  [id number]

## 2011-01-25 NOTE — Discharge Summary (Signed)
NAME:  Marcus, [name] NO.:  [id number]   MEDICAL RECORD NO.:  [id number]                   PATIENT TYPE:  INP   LOCATION:  3714                                 FACILITY:  [facility]   PHYSICIAN:  Pearlean Brownie, M.D.            DATE OF BIRTH:  08-24-1951   DATE OF ADMISSION:  09/12/2002  DATE OF DISCHARGE:  09/14/2002                                 DISCHARGE SUMMARY   DISCHARGE DIAGNOSES:  1. Left upper lobe community-acquired pneumonia.  2. Pleuritic chest pain with no evidence for acute myocardial infarction.  3. Coronary artery disease with a history of inferior lateral myocardial     infarction in 2002.  4. History of transient left-sided numbness and blindness (now resolved).  5. Chronic low back pain.  6. Polysubstance use.  7. Long history of tobacco abuse (quit two years ago).   DISCHARGE MEDICATIONS:  1. Doxycycline 100 mg 1 p.o. b.i.d. x4 days.  2. Atenolol 50 mg 1/2 tablet p.o. q.d.  3. Aspirin 325 mg 1 p.o. q.d.   DISCHARGE INSTRUCTIONS:  The patient may take Tylenol or Advil p.r.n. pain.  The patient is instructed for medical attention if he has worsening chest  pain, shortness of breath, or fevers or chills, or any other symptoms that  concern him.  The patient was also reminded that he should have chest x-ray  in 6-8 weeks to check for pneumonia resolution.   FOLLOW UP:  The patient is to schedule a followup appointment in 2-3 weeks  at [facility] on [address].  Instructions were provided for how to re-  certified as the patient has not had a visit in a significant amount of  time.   PROCEDURES:  1. A CT of chest, on September 13, 2002, revealed segmental infiltrate in the     left upper lobe at the level of the hilum consistent with pneumonia, no     effusions or adenopathy.  Radiology recommended a followup for     resolution.  2. A CT of head, on September 12, 2002, was negative.  3. A chest x-ray, on September 12, 2002,  revealed normal heart size, normal     mediastinum, right lung clear, question of a left perihilar density which     could represent a focal infiltrate or even a mass lesion.  No effusion or     collapse.  4. Bilateral carotid Dopplers on September 12, 2002, revealed a mild diffuse     disease bilaterally with mild intimal thickening on the left.   BRIEF HOSPITAL COURSE:  Marcus [name] is a 60 year old African American male  with known coronary artery disease and medical noncompliance with long-term  history of smoking, and who presented with chest pain and was found to have  a pneumonia on chest CT.  The following issues were addressed while in the  hospital:  1. Left upper lobe pneumonia.  This diagnosis  was almost exclusively my     findings on CT which were reviewed personally with the radiologist.  The     patient had history of remote fever, but none at admission or since.  No     leukocytosis.  The patient has some minimal productive cough.  The     sputums were sent for culture and results are pending at the time of     discharge.  The patient was initially started on Tequin for coverage of     community-acquired pneumonia and at discharge was transitioned to     doxycycline.  He remained afebrile without any events of hypoxia.  He     states he was feeling better since admission.  2. Pleuritic chest pain.  The patient has a history of myocardial ischemia.     The patient was ruled out for MI with serial cardiac enzymes and EKG.     The chest pain was not consistent with a cardiac origin.  As the patient     had not been taking his previously prescribed medical management, he was     restarted on a beta blocker and aspirin.  At the time of discharge, the     patient still had some complaints of pleuritic chest pain that was     somewhat improved.  There was no evidence for a PE on CT.  The patient's     chest pain was likely secondary to the pneumonia.  3. Coronary artery disease with  a history of MI.  As in #1, no evidence for     acute MI.  The patient had a fasting lipid panel which was normal     (cholesterol 139, triglycerides 69, HDL 60, LDL 65).  He was not     initiated on an empiric statin therapy, partly because the patient has a     difficulty obtaining and affording medicine as it is.  4. Transient left-sided numbness and blindness.  At admission, the patient     noted numbness over the entire left side of his body with the chest pain     episode as well as coinciding blindness.  This resolved spontaneously.     It is considered that this possibly could have been a TIA.  The patient     underwent bilateral carotid Dopplers and was found to have minimal     disease.  Therefore, the patient was maintained on aspirin for CVA     prophylaxis.  5. Chronic back pain.  No issues during his hospitalization.  6. Polysubstance use.  The patient was cocaine positive at admission.  The     patient declined any active use of cocaine stating that he did marijuana     which must have been laced, unknown to him.  The patient was strongly     counseled to avoid any cocaine use because he is at high risk for cardiac     damage.  The patient stated he understood.  7. Long history of tobacco use.  The patient has 80-pack-per-year history.     He claims he quit smoking tobacco approximately two years ago.  We     emphasized the importance of continuing smoking cessation and due to his     high risk, recommend followup chest x-ray in 6-8 weeks to ensure     pneumonia resolution as well as to ensure that there is not a new     neoplasm that is being masked.  8. Social.  The patient states he has many financial difficulties which have     prohibited him from obtaining his medicines.  Social work consult was     obtained and the patient was provided with some funding for medicines on     a short-term basis.  They recommend also for him to follow up at    [facility] and further  pursue resources there.   DISPOSITION:  The patient is discharged home with the previously-mentioned  prescriptions and followup.     Douglass Rivers, M.D.                      Pearlean Brownie, M.D.    CH/MEDQ  D:  09/14/2002  T:  09/14/2002  Job:  [id number]   cc:   Edson Snowball.

## 2011-01-25 NOTE — Cardiovascular Report (Signed)
NAMEMarland Kitchen  Marcus, [name] NO.:  [id number]   MEDICAL RECORD NO.:  [id number]          PATIENT TYPE:  OIB   LOCATION:  2899                         FACILITY:  [facility]   PHYSICIAN:  Ricki Rodriguez, M.D.  DATE OF BIRTH:  [birth year]   DATE OF PROCEDURE:  11/30/2004  DATE OF DISCHARGE:                              CARDIAC CATHETERIZATION   PROCEDURES:  1.  Left heart catheterization.  2.  Selective coronary angiography.  3.  Left ventricular function study.   SURGEON:  Ricki Rodriguez, M.D.   INDICATIONS FOR PROCEDURE:  This 60 year old black male had recurrent chest  pain along with history of smoking, hyperlipidemia, and family history of  coronary artery disease.   APPROACH:  Right femoral artery using 4 French sheath and catheters.   COMPLICATIONS:  None.   HEMODYNAMIC DATA:  The left ventricular pressure was 102/13.  Aortic  pressure 102/66.   CORONARY ANATOMY:  The left main coronary artery was unremarkable.   Left anterior descending coronary artery:  The left anterior descending  coronary artery had proximal calcification with proximal eccentric 40%  disease, mid vessel 40% concentric long disease and distal luminal  irregularities.  Diagonal I branch had ostial 50% narrowing followed by  proximal 90% stenosis and then diffuse narrowing.  The entire vessel was  less than 1.5 mm in diameter.   Left circumflex coronary artery:  The left circumflex coronary artery was  unremarkable proximally, but had a mid vessel 30 to 40% eccentric disease  and had a distal diffuse disease. Its ramus branch had a mild proximal  disease.  His obtuse marginal I was unremarkable and obtuse marginal II was  also unremarkable.   Right coronary artery:  The right coronary artery was dominant and had a  mild diffuse disease.  Left ventriculogram:  The left ventriculogram showed mild apical hypokinesia  with ejection fraction of 50%.   IMPRESSION:  1.  Multivessel native  vessel moderate coronary artery disease.  2.  Preserved left ventricular systolic function.   RECOMMENDATIONS:  This patient will continue medical treatment along with  lifestyle modification.      ASK/MEDQ  D:  11/30/2004  T:  11/30/2004  Job:  [id number]

## 2011-01-25 NOTE — Discharge Summary (Signed)
NAME:  Marcus [name], LIFE NO.:  [id number]   MEDICAL RECORD NO.:  [id number]          PATIENT TYPE:  INP   LOCATION:  6527                         FACILITY:  [facility]   PHYSICIAN:  Ricki Rodriguez, M.D.  DATE OF BIRTH:  02-15-1951   DATE OF ADMISSION:  10/08/2006  DATE OF DISCHARGE:  10/10/2006                               DISCHARGE SUMMARY   Patient referred by Dr. Margaretmary Bayley.   PRINCIPAL DIAGNOSES:  1. Coronary atherosclerosis of native vessels.  2. Intermediate coronary syndrome.  3. Hypertension.  4. Hemorrhoids.  5. Percutaneous transluminal coronary angioplasty.  6. Tobacco use disorder.  7. Old myocardial infarction.  8. History of noncompliance.   PRINCIPAL PROCEDURE:  Left heart catheterization.  Selective coronary  angiography.  Left ventricular function study done by Dr. Orpah Cobb  on October 09, 2006.   DISCHARGE MEDICATIONS:  1. Aspirin 325 mg 1 daily.  2. Plavix 75 mg 1 daily.  3. Zocor 40 mg 1 daily.  4. Metoprolol 25 mg half a tablet twice daily.  5. Imdur 30 mg half a tablet daily.  6. Flexeril 10 mg half a tablet twice daily.  7. Tylenol 325 mg 1 or 2 four times daily as needed.  8. Lisinopril 5 mg 1 daily.   DISCHARGE DIET:  Low-sodium, heart-healthy diet.   DISCHARGE ACTIVITY:  The patient to increase activity slowly.   SPECIAL INSTRUCTIONS:  The patient to call [phone] for appointment and  to notify for right groin pain, swelling or discharge.   CONDITION ON DISCHARGE:  Stable.   HISTORY:  This 60 year old male presented with chest pain, abnormal  cardiac enzymes, hypertension, old MI along with history of dietary and  medication noncompliance.   PHYSICAL EXAMINATION:  Revealed a pulse of 76, respirations 16, blood  pressure of 96/56, temperature 97.4.  The patient is well-built, well-  nourished, black male in no acute distress.  HEENT:  The patient is normocephalic, atraumatic.  Has brown eyes.  Pupils are equal,  round, and reactive to light. Ears, nose and throat  grossly unremarkable.  NECK:  No JVD. No carotid bruit.  LUNGS:  Clear bilaterally.  HEART:  Normal S1-S2 without S3 gallop.  ABDOMEN:  Soft and nontender.  EXTREMITIES:  No edema, cyanosis, clubbing.  NEUROLOGICALLY:  Cranial nerves grossly intact.   LABORATORY DATA:  Normal hemoglobin, hematocrit, electrolytes, BUN,  creatinine.  Myoglobin, CK-MB normal.  Troponin I borderline at 0.19.  EKG revealed sinus rhythm with inferior ischemia.  Cardiac  catheterization revealed 50% eccentric lesion in left anterior  descending coronary artery, 60% diagonal 1 vessel disease, 20-30% left  circumflex coronary artery disease and 95% stenosis of the distal right  coronary artery with subsequent vessel size less than 1.5 mm in size.   HOSPITAL COURSE:  The patient was admitted to [facility] unit.  He underwent  cardiac catheterization that showed native vessel coronary artery  disease; however, distal vessel was too narrow to undergo angioplasty.  Hence, the patient was treated medically.  His medications were  adjusted, and he was discharged home in stable condition on October 10, 2006 to be followed by me in 2 weeks.      Ricki Rodriguez, M.D.  Electronically Signed     ASK/MEDQ  D:  01/14/2007  T:  01/14/2007  Job:  [id number]

## 2011-01-25 NOTE — Cardiovascular Report (Signed)
[place]. [facility]  Patient:    Marcus [name], Marcus [name]                      MRN: [id number] Proc. Date: 03/30/01 Adm. Date:  [id number] Attending:  Lyn Records. Iii CC:         CVTS Office   Cardiac Catheterization  INDICATIONS FOR PROCEDURE:  Prolonged chest pain following PCI on March 28, 2001.  The patient had angioplasty performed on that date due to an acute evolving myocardial infarction with stuttering characteristics.  At the time of diagnostic catheterization, there was a suspicion that there might be thrombus in the ramus intermedius branch as this branch is not intervened upon.  PROCEDURES PERFORMED: 1. Left heart catheterization. 2. Selective coronary angiography.  DESCRIPTION OF PROCEDURE:  After informed consent, the patient was brought to the catheterization lab emergently.  A 6 French sheath was placed in the right femoral artery using a modified Seldinger technique.  A 6 French A2 multipurpose catheter was used for hemodynamic recordings, and left and right coronary angiography.  The patient tolerated the procedure without complications.  RESULTS:  I:  HEMODYNAMIC DATA:     a. The aortic pressure 100/65 mmHg.     b. Left ventricular pressure  mmHg.  II:  SELECTIVE CORONARY ANGIOGRAPHY:     a. Left main:  The left main coronary artery is calcified but patent.     b. Left anterior descending coronary artery:  The LAD contains        diffuse proximal and mid vessel disease.  There is a 50-60% proximal        stenosis and a 50-70% mid vessel stenosis after the first diagonal.        The first diagonal is relatively small and contains a 90 plus percent        ostial stenosis.     c. Circumflex artery:  The circumflex artery is large.  It gives origin to        four obtuse marginal branches.  The distal two obtuse marginal        branches arise distally at a bifurcation site.  This is the site of        PCI done on March 28, 2001.  There  is brisk antegrade flow through this        region.  There is an obvious small intimal dissection with no contrast        hang-up.  The region of stenosis is estimated to be 30-40%.  The        circumflex proximal to this is diffusely diseased with up to 50%        narrowing.  There is a small second obtuse marginal branch that is        diffusely diseased.  The circumflex just beyond the first obtuse        marginal contains a somewhat hazy 50-75% stenosis.  The first obtuse        marginal is large and free of any significant obstruction and the        proximal circumflex has luminal irregularities.     d. Ramus branch:  A moderate sized branching ramus arises from the        left main.  There is segmental eccentric 80-90% stenosis in the        proximal and mid vessel.  The superior limb of the bifurcation of this  vessel contains a 90-95% somewhat hazy lesion.  No obvious thrombus is        noted in the ramus branch.     e. Right coronary artery:  The right coronary artery is dominant.  It does        give PDA as a large right ventricular branch that arises from the        mid right.  There is minimal luminal irregularities in the proximal        vessel and perhaps 30% narrowing in the mid vessel.  CONCLUSIONS: 1. The patient has multiple branches that have significant stenoses, disease    within the distal circumflex, the ramus and diagonal is relatively diffuse.    Many of the regions of most severe stenosis occur at branch sites which    will increase the risk of PCI complication.  There is moderate LAD disease.    The right coronary is patent. 2. The previous PCI site is widely patent. There is a noticeable to small    dissection.  TIMI flow is grade 3 in the distal circumflex beyond this    lesion and also grade 3 at the ramus branch. 3. Left ventricular function is not reassessed.  PLAN:  This patient is not a good patient for PCI due to multiple lesions at branch sites  and small vessel size.  Considering his recurrent pain despite attempted PCI and medical therapy, will ask for surgical consultation with reference to the possibility of CABG. DD:  03/30/01 TD:  03/30/01 Job: [id number]

## 2011-01-25 NOTE — Cardiovascular Report (Signed)
[place]. [facility]  Patient:    Marcus [name], Marcus [name]                        MRN: [id number] Proc. Date: 03/28/01 Attending:  Darci Needle, M.D. CC:         Dr. Audria Nine, [facility] on [address] [facility])   Cardiac Catheterization  INDICATION FOR PROCEDURES:  Acute coronary syndrome with acute inferolateral ST abnormality on EKG and positive enzymes.  PROCEDURES PERFORMED: 1. Left heart catheterization. 2. Selective coronary arteriography. 3. Left ventriculography by hand-injection. 4. Percutaneous transluminal coronary angiography of the distal circumflex.  DESCRIPTION OF PROCEDURE:  After informed consent, the patient was brought to the cardiac catheterization laboratory under urgent/emergent circumstances.  A 7-French sheath was placed in the right femoral artery using the modified Seldinger technique and selective coronary angiography was performed using a Judkins 6-French system with #4 catheters both left and right.  The right catheter was also used for hand-injection in the ventricle.  After reviewing the cineangiograms, angioplasty was indicated and was performed on the distal circumflex.  A BMW wire and 7-French JL-4 guide catheter was used and a 2.0 x 15 mm long CrossSail balloon.  Two balloon inflations to 8 atm each for 1-1/2 minutes was performed.  There was resolution of ST segment changes and chest discomfort with recanalization of this artery.  No complications occurred.  The ACT post procedure was 223 seconds.  RESULTS:    I. HEMODYNAMIC DATA:          a. Aortic pressure 132/72.          b. Left ventricular pressure 132/19.   II. LEFT VENTRICULOGRAPHY:  The left ventricle is hyperdynamic.  No      significant wall motion abnormality is noted.  There is the suggestion      of inferior wall hypokinesis.  III. CORONARY ANGIOGRAPHY:          a. Left main coronary:  The left main coronary artery is calcified,  but normal.          b. Left anterior descending coronary:  The left anterior descending             coronary artery is calcified.  There is 50-70% proximal/mid vessel             disease.  The LAD wraps around the apex.  The first diagonal             contains a 90% stenosis.          c. Circumflex artery:  The circumflex artery is large.  It contains a             70% stenosis beyond the first obtuse marginal and after the second             obtuse marginal in the distal vessel before it bifurcates, there             is a 99% stenosis with TIMI grade 1-2 flow.  This was assumed to             be the culprit region based upon the patients EKG changes.          d. Ramus branch:  The ramus intermedius branch is a bifurcating             vessel that in at least one view appears to contain coronary  thrombus.  Other views did not demonstrate this.  There is normal             antegrade flow.          e. Right coronary artery:  The right coronary artery is large.  It             contains minimal luminal irregularities in the mid vessel.  The             PDA and left ventricular branches are large.   IV. PERCUTANEOUS CORONARY INTERVENTION:  Angioplasty was performed with some      difficulty.  A 2.0 balloon was used to dilate the distal circumflex and      improve TIMI flow into the distal two circumflex branches from grade 1 to      grade 3.  ACT post procedure was 222.  CONCLUSIONS: 1. Acute coronary syndrome with EKG changes suggesting inferolateral acute    ischemia/injury. 2. Successful percutaneous coronary intervention on the distal circumflex with    reduction of the stenosis from 99% to less than 40% with TIMI grade 3 flow. 3. Significant ramus intermedius stenosis with potentially thrombus containing    lesion noted on only one view.  Should the patient have recurrent    discomfort, especially in the absence of inferior EKG changes, would    contemplate intervention on  the ramus branch. 4. There is moderate left anterior descending artery disease, minimal right    coronary disease. 5. Left ventricular function is normal.  PLAN:  Medical therapy.  Consider PCI on the ramus branch if recurrent ischemia.DD:  03/28/01 TD:  03/28/01 Job: [id number]

## 2011-01-25 NOTE — Consult Note (Signed)
NAME:  Marcus [name], Marcus [name] NO.:  [id number]   MEDICAL RECORD NO.:  [id number]                   PATIENT TYPE:  INP   LOCATION:  3736                                 FACILITY:  [facility]   PHYSICIAN:  Lesleigh Noe, M.D.            DATE OF BIRTH:  1951/04/11   DATE OF CONSULTATION:  DATE OF DISCHARGE:                                   CONSULTATION   CONCLUSION:  1. Chest discomfort, right arm and upper precordium occurring spontaneously,     etiology uncertain. Atypical for ischemic pain. Rule out coronary artery     disease progression.  2. Coronary atherosclerotic heart disease with cardiac catheterization in     2002 demonstrating distal circumflex disease.  3. History of cocaine use.  4. Hyperlipidemia.   RECOMMENDATIONS:  1. Continue nitroglycerin patch.  2. Sublingual nitroglycerin for recurrent pain.  3. Stress Cardiolite to rule out significant areas of ischemia.  4. Use anti-ischemic and antilipid therapy.   PAST MEDICAL HISTORY:  The patient is 79 and does not have a primary care  physician. He is seen at [facility]. He underwent coronary  angiography in July 2002 and was demonstrated to have two vessel coronary  artery disease. He was also found to have evidence of an infralateral  infarction. Angioplasty was performed on the distal circumflex. He was also  noted to have moderate to moderately severe mid LAD disease. He has not been  able to afford his medications. He has continued to have some difficulty  with substance abuse.   MEDICATIONS:  Aspirin once a day (sporadic). No other medication.   FAMILY HISTORY:  Father died of an MI at 61, also had hypertension. Mother  has a history of hypertension and renal disease.   SOCIAL HISTORY:  Lives with brother. Has six children.   HABITS:  Denies ethanol or tobacco use. States he has not used illicit drugs  since January 2004.   PHYSICAL EXAMINATION:  VITAL SIGNS:  Blood  pressure 120/78, heart rate 68,  respirations 20.  HEART:  An S4 gallop, otherwise unremarkable.   LABORATORY DATA:  CK, MB, and troponins are negative for infarction. EKG  reveals T-wave inversion in 3 and AVL, but no acute changes. Urine drug  screen positive for opiates.   DISCUSSION:  The patient's symptoms are atypical. The patient's urine screen  is positive for using opiates. This could be the source of ischemic pain. He  also has known coronary artery disease. We will get a Cardiolite study to  rule out any large areas of ischemia. If these are present, then perhaps  coronary angiography will be considered. Otherwise, would advise abstention  from substances that can cause coronary constriction. Would resume medical  therapy for antiplatelet and antilipid therapy as well as blood pressure  medications.  Lesleigh Noe, M.D.    HWS/MEDQ  D:  05/18/2003  T:  05/18/2003  Job:  [phone]

## 2011-03-11 ENCOUNTER — Encounter (INDEPENDENT_AMBULATORY_CARE_PROVIDER_SITE_OTHER): Payer: Self-pay | Admitting: Surgery

## 2011-03-11 ENCOUNTER — Ambulatory Visit (INDEPENDENT_AMBULATORY_CARE_PROVIDER_SITE_OTHER): Payer: Medicaid Other | Admitting: Surgery

## 2011-03-11 VITALS — BP 130/88 | HR 56 | Temp 96.4°F | Ht 67.0 in | Wt 178.8 lb

## 2011-03-11 DIAGNOSIS — N62 Hypertrophy of breast: Secondary | ICD-10-CM

## 2011-03-11 DIAGNOSIS — I509 Heart failure, unspecified: Secondary | ICD-10-CM

## 2011-03-11 DIAGNOSIS — I2510 Atherosclerotic heart disease of native coronary artery without angina pectoris: Secondary | ICD-10-CM

## 2011-03-11 DIAGNOSIS — I10 Essential (primary) hypertension: Secondary | ICD-10-CM

## 2011-03-11 DIAGNOSIS — N63 Unspecified lump in unspecified breast: Secondary | ICD-10-CM

## 2011-03-11 DIAGNOSIS — N6310 Unspecified lump in the right breast, unspecified quadrant: Secondary | ICD-10-CM

## 2011-03-11 NOTE — Progress Notes (Signed)
Subjective:     Patient ID: Marcus [name], male   DOB: [birth year], 60 y.o.   MRN: [id number]    BP 130/88  Pulse 56  Temp 96.4 F (35.8 C)  Ht 5\' 7"  (1.702 m)  Wt 178 lb 12.8 oz (81.103 kg)  BMI 28.00 kg/m2    HPI The patient presents with a one-month history of tenderness under his right nipple. He denies any nipple discharge or a mass that he can feel. The patient is a history of an open left breast biopsy. This was done 31 years ago. This was done secondary to pain and fullness under his left nipple. This was done according to the patient. He began to develop mild to moderate discomfort under the right nipple. He cannot feel a mass there. There is been no nipple discharge redness or fluctuance. He is sent today at the request of his doctor   Review of Systems  Constitutional: Negative.   HENT: Negative.   Eyes: Negative.   Respiratory: Positive for shortness of breath.   Cardiovascular: Negative.   Gastrointestinal: Negative.   Genitourinary: Negative.   Musculoskeletal: Negative.   Skin: Negative.   Neurological: Negative.   Hematological: Negative.   Psychiatric/Behavioral: Negative.        Objective:   Physical Exam  Constitutional: He is oriented to person, place, and time. He appears well-developed and well-nourished.  HENT:  Head: Normocephalic and atraumatic.  Eyes: Conjunctivae and EOM are normal. Pupils are equal, round, and reactive to light.  Neck: Normal range of motion. Neck supple.  Cardiovascular: Normal rate, regular rhythm, normal heart sounds and intact distal pulses.  Exam reveals no gallop and no friction rub.   No murmur heard. Pulmonary/Chest: Effort normal and breath sounds normal.       No breast mass bilaterally.  Right nipple tender.  Mild gynecomastia noted.  No axillary adenopathy bilaterally.  Abdominal: Soft. Bowel sounds are normal. He exhibits no distension. There is no tenderness.  Musculoskeletal: Normal range of motion.    Neurological: He is alert and oriented to person, place, and time.  Skin: Skin is warm and dry.  Psychiatric: He has a normal mood and affect. His behavior is normal. Judgment and thought content normal.       Assessment:  Right breast gynecomastia  Congestive heart failure  Coronary artery disease  Hypertension   Plan:  The patient has right breast gynecomastia. This explains his right nipple discomfort. He has multiple risk factors for this. Given his medication list and history of congestive heart failure I think this is contributing to his right breast gynecomastia. I do not feel any suspicious masses in either breast. Both axilla are normal. It sounds like he had this problem many years ago on his left side. He drinks 2 beers a day but denies any history of cirrhosis or liver disease. At this point in time I recommend observation. If his symptoms don't resolve or if he develops a hard mass have asked him to contact me.

## 2011-03-11 NOTE — Patient Instructions (Signed)
You have a condition called gynecomastia.  There is no suspicious mass in your right breast.  The pain at the nipple is secondary to gynecomastia.  This is secondary to a combination of her medical problems and medications you're taking. There is not a hard mass in her breast or anything to indicate breast cancer. If this persists beyond 3 months please contact our office. If a hard lump develops,  Please contact our office.

## 2012-04-29 ENCOUNTER — Emergency Department (HOSPITAL_COMMUNITY): Payer: Medicaid Other

## 2012-04-29 ENCOUNTER — Inpatient Hospital Stay (HOSPITAL_COMMUNITY)
Admission: EM | Admit: 2012-04-29 | Discharge: 2012-05-02 | DRG: 287 | Disposition: A | Discharge status: Home / Self Care | Payer: Medicaid Other | Attending: Cardiovascular Disease | Admitting: Cardiovascular Disease

## 2012-04-29 ENCOUNTER — Encounter (HOSPITAL_COMMUNITY): Payer: Self-pay | Admitting: *Deleted

## 2012-04-29 DIAGNOSIS — F3289 Other specified depressive episodes: Secondary | ICD-10-CM | POA: Diagnosis present

## 2012-04-29 DIAGNOSIS — Z951 Presence of aortocoronary bypass graft: Secondary | ICD-10-CM

## 2012-04-29 DIAGNOSIS — F411 Generalized anxiety disorder: Secondary | ICD-10-CM | POA: Diagnosis present

## 2012-04-29 DIAGNOSIS — M129 Arthropathy, unspecified: Secondary | ICD-10-CM | POA: Diagnosis present

## 2012-04-29 DIAGNOSIS — Z880 Allergy status to penicillin: Secondary | ICD-10-CM

## 2012-04-29 DIAGNOSIS — F1210 Cannabis abuse, uncomplicated: Secondary | ICD-10-CM | POA: Diagnosis present

## 2012-04-29 DIAGNOSIS — K219 Gastro-esophageal reflux disease without esophagitis: Secondary | ICD-10-CM | POA: Diagnosis present

## 2012-04-29 DIAGNOSIS — Z9861 Coronary angioplasty status: Secondary | ICD-10-CM

## 2012-04-29 DIAGNOSIS — R079 Chest pain, unspecified: Principal | ICD-10-CM

## 2012-04-29 DIAGNOSIS — I2510 Atherosclerotic heart disease of native coronary artery without angina pectoris: Secondary | ICD-10-CM | POA: Diagnosis present

## 2012-04-29 DIAGNOSIS — M47812 Spondylosis without myelopathy or radiculopathy, cervical region: Secondary | ICD-10-CM | POA: Diagnosis present

## 2012-04-29 DIAGNOSIS — Z87891 Personal history of nicotine dependence: Secondary | ICD-10-CM

## 2012-04-29 DIAGNOSIS — J449 Chronic obstructive pulmonary disease, unspecified: Secondary | ICD-10-CM | POA: Diagnosis present

## 2012-04-29 DIAGNOSIS — I10 Essential (primary) hypertension: Secondary | ICD-10-CM | POA: Diagnosis present

## 2012-04-29 DIAGNOSIS — I509 Heart failure, unspecified: Secondary | ICD-10-CM | POA: Diagnosis present

## 2012-04-29 DIAGNOSIS — F329 Major depressive disorder, single episode, unspecified: Secondary | ICD-10-CM | POA: Diagnosis present

## 2012-04-29 DIAGNOSIS — J4489 Other specified chronic obstructive pulmonary disease: Secondary | ICD-10-CM | POA: Diagnosis present

## 2012-04-29 DIAGNOSIS — E785 Hyperlipidemia, unspecified: Secondary | ICD-10-CM | POA: Diagnosis present

## 2012-04-29 LAB — POCT I-STAT, CHEM 8
Creatinine, Ser: 1.4 mg/dL — ABNORMAL HIGH (ref 0.50–1.35)
HCT: 43 % (ref 39.0–52.0)
Hemoglobin: 14.6 g/dL (ref 13.0–17.0)
Potassium: 3.9 mEq/L (ref 3.5–5.1)
Sodium: 143 mEq/L (ref 135–145)

## 2012-04-29 LAB — CBC WITH DIFFERENTIAL/PLATELET
Basophils Absolute: 0 10*3/uL (ref 0.0–0.1)
Basophils Relative: 0 % (ref 0–1)
Hemoglobin: 13.6 g/dL (ref 13.0–17.0)
MCHC: 32.7 g/dL (ref 30.0–36.0)
Monocytes Relative: 10 % (ref 3–12)
Neutro Abs: 2.3 10*3/uL (ref 1.7–7.7)
Neutrophils Relative %: 49 % (ref 43–77)
Platelets: 167 10*3/uL (ref 150–400)

## 2012-04-29 LAB — HEPARIN LEVEL (UNFRACTIONATED): Heparin Unfractionated: 0.35 IU/mL (ref 0.30–0.70)

## 2012-04-29 LAB — CARDIAC PANEL(CRET KIN+CKTOT+MB+TROPI)
CK, MB: 2.4 ng/mL (ref 0.3–4.0)
CK, MB: 2.2 ng/mL (ref 0.3–4.0)
Total CK: 272 U/L — ABNORMAL HIGH (ref 7–232)
Total CK: 145 U/L (ref 7–232)
Troponin I: <0.3 ng/mL (ref <0.30)

## 2012-04-29 LAB — POCT I-STAT TROPONIN I: Troponin i, poc: 0 ng/mL (ref 0.00–0.08)

## 2012-04-29 LAB — PRO B NATRIURETIC PEPTIDE
Pro B Natriuretic peptide (BNP): 91.7 pg/mL (ref 0–125)
Pro B Natriuretic peptide (BNP): 92 pg/mL (ref 0–125)

## 2012-04-29 MED ORDER — ATORVASTATIN CALCIUM 10 MG PO TABS
10.0000 mg | ORAL_TABLET | Freq: Every day | ORAL | Status: DC
  Administered 2012-04-29 – 2012-05-01 (×3): 10 mg via ORAL
  Filled 2012-04-29 (×5): qty 1

## 2012-04-29 MED ORDER — ONDANSETRON HCL 4 MG/2ML IJ SOLN: 4.0000 mg | Freq: Four times a day (QID) | INTRAMUSCULAR | Status: DC | PRN

## 2012-04-29 MED ORDER — ACETAMINOPHEN 325 MG PO TABS: 650.0000 mg | ORAL_TABLET | ORAL | Status: DC | PRN

## 2012-04-29 MED ORDER — HEPARIN BOLUS VIA INFUSION
4000.0000 [IU] | Freq: Once | INTRAVENOUS | Status: AC
  Administered 2012-04-29: 4000 [IU] via INTRAVENOUS
  Filled 2012-04-29: qty 4000

## 2012-04-29 MED ORDER — ASPIRIN 81 MG PO CHEW
324.0000 mg | CHEWABLE_TABLET | ORAL | Status: AC
  Administered 2012-04-29: 324 mg via ORAL
  Filled 2012-04-29: qty 1
  Filled 2012-04-29: qty 3

## 2012-04-29 MED ORDER — SODIUM CHLORIDE 0.9 % IJ SOLN: 3.0000 mL | INTRAMUSCULAR | Status: DC | PRN

## 2012-04-29 MED ORDER — ASPIRIN 81 MG PO CHEW
324.0000 mg | CHEWABLE_TABLET | Freq: Once | ORAL | Status: AC
  Administered 2012-04-29: 324 mg via ORAL
  Filled 2012-04-29: qty 4

## 2012-04-29 MED ORDER — ASPIRIN EC 81 MG PO TBEC
81.0000 mg | DELAYED_RELEASE_TABLET | Freq: Every day | ORAL | Status: DC
  Administered 2012-04-30: 81 mg via ORAL
  Filled 2012-04-29 (×2): qty 1

## 2012-04-29 MED ORDER — ASPIRIN 300 MG RE SUPP
300.0000 mg | RECTAL | Status: AC
  Filled 2012-04-29: qty 1

## 2012-04-29 MED ORDER — NITROGLYCERIN 0.4 MG SL SUBL: 0.4000 mg | SUBLINGUAL_TABLET | SUBLINGUAL | Status: DC | PRN

## 2012-04-29 MED ORDER — ZOLPIDEM TARTRATE 5 MG PO TABS
5.0000 mg | ORAL_TABLET | Freq: Every evening | ORAL | Status: DC | PRN
  Administered 2012-04-29 – 2012-05-01 (×2): 5 mg via ORAL
  Filled 2012-04-29 (×2): qty 1

## 2012-04-29 MED ORDER — CLOPIDOGREL BISULFATE 75 MG PO TABS
75.0000 mg | ORAL_TABLET | Freq: Every day | ORAL | Status: DC
  Administered 2012-04-30 – 2012-05-01 (×2): 75 mg via ORAL
  Filled 2012-04-29 (×3): qty 1

## 2012-04-29 MED ORDER — SODIUM CHLORIDE 0.9 % IJ SOLN
3.0000 mL | Freq: Two times a day (BID) | INTRAMUSCULAR | Status: DC
  Administered 2012-04-30 – 2012-05-02 (×4): 3 mL via INTRAVENOUS

## 2012-04-29 MED ORDER — HEPARIN (PORCINE) IN NACL 100-0.45 UNIT/ML-% IJ SOLN
1050.0000 [IU]/h | INTRAMUSCULAR | Status: DC
  Administered 2012-04-29: 1050 [IU]/h via INTRAVENOUS
  Administered 2012-04-29: 1000 [IU]/h via INTRAVENOUS
  Administered 2012-04-30: 1050 [IU]/h via INTRAVENOUS
  Filled 2012-04-29 (×3): qty 250

## 2012-04-29 MED ORDER — SODIUM CHLORIDE 0.9 % IV SOLN
250.0000 mL | INTRAVENOUS | Status: DC | PRN
  Administered 2012-04-30: 250 mL via INTRAVENOUS

## 2012-04-29 MED ORDER — METOPROLOL TARTRATE 12.5 MG HALF TABLET
12.5000 mg | ORAL_TABLET | Freq: Two times a day (BID) | ORAL | Status: DC
  Administered 2012-04-29 – 2012-05-02 (×5): 12.5 mg via ORAL
  Filled 2012-04-29 (×7): qty 1

## 2012-04-29 NOTE — ED Provider Notes (Signed)
Medical screening examination/treatment/procedure(s) were performed by non-physician practitioner and as supervising physician I was immediately available for consultation/collaboration.   Carleene Cooper III, MD 04/29/12 2031

## 2012-04-29 NOTE — ED Notes (Signed)
Provider at bedside

## 2012-04-29 NOTE — Progress Notes (Signed)
ANTICOAGULATION CONSULT NOTE - Follow Up Consult  Pharmacy Consult for heparin Indication: ACS/STEMI  Allergies  Allergen Reactions  . Penicillins Hives    Patient Measurements: Height: 5\' 7"  (170.2 cm) Weight: 188 lb 7.9 oz (85.5 kg) IBW/kg (Calculated) : 66.1  Heparin Dosing Weight: 80 kg  Vital Signs: Temp: 97.7 F (36.5 C) (08/21 2125) Temp src: Oral (08/21 2125) BP: 136/71 mmHg (08/21 2125) Pulse Rate: 80  (08/21 2125)  Labs:  Basename 04/29/12 2235 04/29/12 1712 04/29/12 1306 04/29/12 1248  HGB -- -- 14.6 13.6  HCT -- -- 43.0 41.6  PLT -- -- -- 167  APTT -- -- -- --  LABPROT -- -- -- --  INR -- -- -- --  HEPARINUNFRC 0.35 -- -- --  CREATININE -- -- 1.40* --  CKTOTAL -- 272* -- --  CKMB -- 2.4 -- --  TROPONINI -- <0.30 -- --    Estimated Creatinine Clearance: 57.9 ml/min (by C-G formula based on Cr of 1.4).   Medical History: Past Medical History  Diagnosis Date  . Arthritis   . CHF (congestive heart failure)   . Depression   . GERD (gastroesophageal reflux disease)   . Hyperlipidemia   . Hypertension   . Cervical spine disease     Medications:  Scheduled:     . aspirin  324 mg Oral Once  . aspirin  324 mg Oral NOW   Or  . aspirin  300 mg Rectal NOW  . aspirin EC  81 mg Oral Daily  . atorvastatin  10 mg Oral q1800  . clopidogrel  75 mg Oral Q breakfast  . heparin  4,000 Units Intravenous Once  . metoprolol tartrate  12.5 mg Oral BID  . sodium chloride  3 mL Intravenous Q12H   Infusions:     . heparin 1,000 Units/hr (04/29/12 1753)    Assessment: 61 yo male with chest pain to r/o MI will be put on heparin therapy.  Baseline H/H 13.6/41.6; Plt 167.  Not on any oral anticoagulant at home.  Cath in am. Heparin level is at lower-end of goal range.   Goal of Therapy:  Heparin level 0.3-0.7 units/ml Monitor platelets by anticoagulation protocol: Yes   Plan:  1. Increase IV heparin to 1050 units/hr to keep at-goal.  2. Follow-up AM  labs.  Emeline Gins 04/29/2012,11:05 PM

## 2012-04-29 NOTE — Progress Notes (Signed)
ANTICOAGULATION CONSULT NOTE - Initial Consult  Pharmacy Consult for heparin Indication: ACS/STEMI  Allergies  Allergen Reactions  . Penicillins Hives    Patient Measurements:   Heparin Dosing Weight: 80 kg  Vital Signs: Temp: 98.2 F (36.8 C) (08/21 1106) Temp src: Oral (08/21 1106) BP: 144/86 mmHg (08/21 1600) Pulse Rate: 71  (08/21 1600)  Labs:  Basename 04/29/12 1306 04/29/12 1248  HGB 14.6 13.6  HCT 43.0 41.6  PLT -- 167  APTT -- --  LABPROT -- --  INR -- --  HEPARINUNFRC -- --  CREATININE 1.40* --  CKTOTAL -- --  CKMB -- --  TROPONINI -- --    The CrCl is unknown because both a height and weight (above a minimum accepted value) are required for this calculation.   Medical History: Past Medical History  Diagnosis Date  . Arthritis   . CHF (congestive heart failure)   . Depression   . GERD (gastroesophageal reflux disease)   . Hyperlipidemia   . Hypertension   . Cervical spine disease     Medications:  Scheduled:    . aspirin  324 mg Oral Once   Infusions:    Assessment: 61 yo male with chest pain to r/o MI will be put on heparin therapy.  Baseline H/H 13.6/41.6; Plt 167.  Not on any oral anticoagulant at home.  Cath in am.  Goal of Therapy:  Heparin level 0.3-0.7 units/ml Monitor platelets by anticoagulation protocol: Yes   Plan:  1) Heparin 4000 units iv bolus x1, then 1000 units/hr 2) 6 hour heparin level 3) Daily heparin level and CBC  Matther Labell, [facility]z-[facility]in 04/29/2012,4:49 PM

## 2012-04-29 NOTE — ED Provider Notes (Signed)
History     CSN: [id number]  Arrival date & time 04/29/12  1045   First MD Initiated Contact with Patient 04/29/12 1258      Chief Complaint  Patient presents with  . Chest Pain    (Consider location/radiation/quality/duration/timing/severity/associated sxs/prior treatment) HPI Comments: Marcus [name] is a 62 y.o. Male who presents with complaint of a chest pain. Pt states he has had chest pains " for years." states pain has been worsening. This time it has been worse for the last few days. It comes and goes. States worsened with exertion and eating. States when he eats, feels like food is getting stuck in the center of the chest. Pt also reports at time associated SOB. No dizziness, nausea, diaphoresis. States he has seen Dr. Algie Coffer for this int he past, but has not been to see him in over a year. Pt with hx of CABG and stenting in the past. Also hx of GERD. Taking tums with no relief. Pt currently chest pain free.   The history is provided by the patient.    Past Medical History  Diagnosis Date  . Arthritis   . CHF (congestive heart failure)   . Depression   . GERD (gastroesophageal reflux disease)   . Hyperlipidemia   . Hypertension   . Cervical spine disease     Past Surgical History  Procedure Date  . Triple bypass   . Breast lump removal     Family History  Problem Relation Age of Onset  . Kidney disease Mother   . Asthma Mother   . Hypertension Father     History  Substance Use Topics  . Smoking status: Former Games developer  . Smokeless tobacco: Not on file  . Alcohol Use: Yes     occ      Review of Systems  Constitutional: Negative for fever and chills.  HENT: Negative for neck pain and neck stiffness.   Respiratory: Positive for chest tightness and shortness of breath. Negative for cough.   Cardiovascular: Positive for chest pain. Negative for palpitations and leg swelling.  Gastrointestinal: Negative for nausea, vomiting and abdominal pain.    Genitourinary: Negative for dysuria and flank pain.  Musculoskeletal: Negative for back pain.  Skin: Negative for color change and rash.  Neurological: Negative for dizziness, weakness, light-headedness and headaches.  Hematological: Negative.   Psychiatric/Behavioral: Negative.     Allergies  Penicillins  Home Medications   Current Outpatient Rx  Name Route Sig Dispense Refill  . CALCIUM CARBONATE ANTACID 750 MG PO CHEW Oral Chew 2 tablets by mouth 2 (two) times daily as needed. For heartburn      BP 133/88  Pulse 85  Temp 98.2 F (36.8 C) (Oral)  Resp 25  SpO2 99%  Physical Exam  Nursing note and vitals reviewed. Constitutional: He is oriented to person, place, and time. He appears well-developed and well-nourished. No distress.  HENT:  Head: Normocephalic.  Eyes: Conjunctivae are normal.  Neck: Neck supple.  Cardiovascular: Normal rate, regular rhythm and normal heart sounds.   Pulmonary/Chest: Effort normal and breath sounds normal. No respiratory distress. He has no wheezes. He has no rales.  Abdominal: Soft. Bowel sounds are normal. He exhibits no distension. There is no tenderness. There is no rebound.  Musculoskeletal: He exhibits no edema.  Neurological: He is alert and oriented to person, place, and time.  Skin: Skin is warm and dry.  Psychiatric: He has a normal mood and affect.    ED Course  Procedures (including critical care time)   Date: 04/29/2012  Rate: 78  Rhythm: normal sinus rhythm  QRS Axis: normal  Intervals: normal  ST/T Wave abnormalities: nonspecific T wave changes  Conduction Disutrbances:none  Narrative Interpretation:   Old EKG Reviewed: unchanged  Pt with chest pain on and off for months, worsened few days ago. Hx of CAD and chf. Labs, cxr ordered. ECG with no changes. Will monitor.   Results for orders placed during the hospital encounter of 04/29/12  CBC WITH DIFFERENTIAL      Component Value Range   WBC 4.7  4.0 - 10.5 K/uL    RBC 5.03  4.22 - 5.81 MIL/uL   Hemoglobin 13.6  13.0 - 17.0 g/dL   HCT 44.0  10.2 - 72.5 %   MCV 82.7  78.0 - 100.0 fL   MCH 27.0  26.0 - 34.0 pg   MCHC 32.7  30.0 - 36.0 g/dL   RDW 36.6  44.0 - 34.7 %   Platelets 167  150 - 400 K/uL   Neutrophils Relative 49  43 - 77 %   Neutro Abs 2.3  1.7 - 7.7 K/uL   Lymphocytes Relative 40  12 - 46 %   Lymphs Abs 1.9  0.7 - 4.0 K/uL   Monocytes Relative 10  3 - 12 %   Monocytes Absolute 0.5  0.1 - 1.0 K/uL   Eosinophils Relative 1  0 - 5 %   Eosinophils Absolute 0.0  0.0 - 0.7 K/uL   Basophils Relative 0  0 - 1 %   Basophils Absolute 0.0  0.0 - 0.1 K/uL  PRO B NATRIURETIC PEPTIDE      Component Value Range   Pro B Natriuretic peptide (BNP) 91.7  0 - 125 pg/mL  POCT I-STAT, CHEM 8      Component Value Range   Sodium 143  135 - 145 mEq/L   Potassium 3.9  3.5 - 5.1 mEq/L   Chloride 105  96 - 112 mEq/L   BUN 7  6 - 23 mg/dL   Creatinine, Ser 4.25 (*) 0.50 - 1.35 mg/dL   Glucose, Bld 89  70 - 99 mg/dL   Calcium, Ion 9.56  3.87 - 1.30 mmol/L   TCO2 25  0 - 100 mmol/L   Hemoglobin 14.6  13.0 - 17.0 g/dL   HCT 56.4  33.2 - 95.1 %  POCT I-STAT TROPONIN I      Component Value Range   Troponin i, poc 0.00  0.00 - 0.08 ng/mL   Comment 3            Dg Chest 2 View  04/29/2012  *RADIOLOGY REPORT*  Clinical Data: Chest pain.  Shortness of breath.  Hypertension.  CHEST - 2 VIEW  Comparison: 09/21/2010  Findings: Prior CABG noted without cardiomegaly or edema.  Thoracic spondylosis noted.  Lungs appear clear.  No pleural effusion noted.  IMPRESSION:  1.  Prior CABG. 2.  Thoracic spondylosis. 3.  No acute findings.   Original Report Authenticated By: Dellia Cloud, M.D.     2:30 PM Labs unremarkable. Pt is CP free. ASA ordered. Will call Dr. Algie Coffer.    Dr. Algie Coffer here, will admit pt for rule out. Pt continues to be pain free.   1. Chest pain       MDM  Pt with hx of CAD, prior CABG and stents. Concerning for ACS, negative  troponin. Dr. Algie Coffer to admit.  Lottie Mussel, PA 04/29/12 [phone]

## 2012-04-29 NOTE — H&P (Signed)
Marcus [name] is an 61 y.o. male.   Chief Complaint: Chest pain HPI: 61 years old male with substernal chest pain, dull, non-radiating, + shortness of breath, no sweating.  Past Medical History  Diagnosis Date  . Arthritis   . CHF (congestive heart failure)   . Depression   . GERD (gastroesophageal reflux disease)   . Hyperlipidemia   . Hypertension   . Cervical spine disease       Past Surgical History  Procedure Date  . Triple bypass   . Breast lump removal     Family History  Problem Relation Age of Onset  . Kidney disease Mother   . Asthma Mother   . Hypertension Father    Social History:  reports that he has quit smoking. He does not have any smokeless tobacco history on file. He reports that he drinks alcohol. He reports that he uses illicit drugs (Marijuana).  Allergies:  Allergies  Allergen Reactions  . Penicillins Hives     (Not in a hospital admission)  Results for orders placed during the hospital encounter of 04/29/12 (from the past 48 hour(s))  CBC WITH DIFFERENTIAL     Status: Normal   Collection Time   04/29/12 12:48 PM      Component Value Range Comment   WBC 4.7  4.0 - 10.5 K/uL    RBC 5.03  4.22 - 5.81 MIL/uL    Hemoglobin 13.6  13.0 - 17.0 g/dL    HCT 13.0  86.5 - 78.4 %    MCV 82.7  78.0 - 100.0 fL    MCH 27.0  26.0 - 34.0 pg    MCHC 32.7  30.0 - 36.0 g/dL    RDW 69.6  29.5 - 28.4 %    Platelets 167  150 - 400 K/uL    Neutrophils Relative 49  43 - 77 %    Neutro Abs 2.3  1.7 - 7.7 K/uL    Lymphocytes Relative 40  12 - 46 %    Lymphs Abs 1.9  0.7 - 4.0 K/uL    Monocytes Relative 10  3 - 12 %    Monocytes Absolute 0.5  0.1 - 1.0 K/uL    Eosinophils Relative 1  0 - 5 %    Eosinophils Absolute 0.0  0.0 - 0.7 K/uL    Basophils Relative 0  0 - 1 %    Basophils Absolute 0.0  0.0 - 0.1 K/uL   PRO B NATRIURETIC PEPTIDE     Status: Normal   Collection Time   04/29/12  1:01 PM      Component Value Range Comment   Pro B Natriuretic peptide  (BNP) 91.7  0 - 125 pg/mL   POCT I-STAT TROPONIN I     Status: Normal   Collection Time   04/29/12  1:04 PM      Component Value Range Comment   Troponin i, poc 0.00  0.00 - 0.08 ng/mL    Comment 3            POCT I-STAT, CHEM 8     Status: Abnormal   Collection Time   04/29/12  1:06 PM      Component Value Range Comment   Sodium 143  135 - 145 mEq/L    Potassium 3.9  3.5 - 5.1 mEq/L    Chloride 105  96 - 112 mEq/L    BUN 7  6 - 23 mg/dL    Creatinine, Ser 1.32 (*) 0.50 -  1.35 mg/dL    Glucose, Bld 89  70 - 99 mg/dL    Calcium, Ion 1.61  0.96 - 1.30 mmol/L    TCO2 25  0 - 100 mmol/L    Hemoglobin 14.6  13.0 - 17.0 g/dL    HCT 04.5  40.9 - 81.1 %    Dg Chest 2 View  04/29/2012  *RADIOLOGY REPORT*  Clinical Data: Chest pain.  Shortness of breath.  Hypertension.  CHEST - 2 VIEW  Comparison: 09/21/2010  Findings: Prior CABG noted without cardiomegaly or edema.  Thoracic spondylosis noted.  Lungs appear clear.  No pleural effusion noted.  IMPRESSION:  1.  Prior CABG. 2.  Thoracic spondylosis. 3.  No acute findings.   Original Report Authenticated By: Dellia Cloud, M.D.     @ROS @ No weight gain or loss. Wears glasses, mild hearing loss, + COPD, + CAD + CABG + hypertension, No gi bleed, no kidney stone, no stroke, no seizures or psychiatric admission. Blood pressure 143/85, pulse 62, temperature 98.2 F (36.8 C), temperature source Oral, resp. rate 22, SpO2 95.00%. General appearance: alert and cooperative Head: Normocephalic, without obvious abnormality, atraumatic Eyes: Brown, conjunctivae/corneas clear. PERRL, EOM's intact. Throat: lips, mucosa, and tongue normal; teeth and gums normal Neck: no adenopathy, no carotid bruit, no JVD, supple, symmetrical, trachea midline and thyroid not enlarged. Resp: clear to auscultation bilaterally Chest wall: no tenderness Cardio: regular rate and rhythm, S1, S2 normal, no murmur, click, rub or gallop GI: soft, non-tender; bowel sounds  normal; no masses,  no organomegaly Extremities: extremities normal, atraumatic, no cyanosis or edema Skin: Skin color, texture, turgor normal. No rashes or lesions Neurologic: Alert and oriented X 3, normal strength and tone. Normal coordination and gait  Assessment/Plan Chest pain CAD CABG Stent in LCX COPD Hypertension Anxiety  Admit R/O MI Possible cardiac cath in AM.  [facility] S 04/29/2012, 2:55 PM

## 2012-04-29 NOTE — ED Notes (Signed)
Pt is here with chest pain to mid chest that started a couple of weeks ago and continues to increase.  Pt denies sob or nausea

## 2012-04-30 ENCOUNTER — Inpatient Hospital Stay (HOSPITAL_COMMUNITY): Payer: Medicaid Other

## 2012-04-30 LAB — CBC
HCT: 43 % (ref 39.0–52.0)
Hemoglobin: 13.8 g/dL (ref 13.0–17.0)
RBC: 5.18 MIL/uL (ref 4.22–5.81)
WBC: 5 10*3/uL (ref 4.0–10.5)

## 2012-04-30 LAB — BASIC METABOLIC PANEL
BUN: 8 mg/dL (ref 6–23)
CO2: 26 mEq/L (ref 19–32)
Chloride: 108 mEq/L (ref 96–112)
GFR calc non Af Amer: 54 mL/min — ABNORMAL LOW (ref >90)
Sodium: 143 mEq/L (ref 135–145)

## 2012-04-30 LAB — LIPID PANEL
LDL Cholesterol: 81 mg/dL (ref 0–99)
Total CHOL/HDL Ratio: 3.1 RATIO
VLDL: 22 mg/dL (ref 0–40)

## 2012-04-30 LAB — CARDIAC PANEL(CRET KIN+CKTOT+MB+TROPI)
CK, MB: 1.9 ng/mL (ref 0.3–4.0)
Relative Index: 1.4 (ref 0.0–2.5)
Total CK: 132 U/L (ref 7–232)

## 2012-04-30 LAB — HEPARIN LEVEL (UNFRACTIONATED): Heparin Unfractionated: 0.42 IU/mL (ref 0.30–0.70)

## 2012-04-30 MED ORDER — SODIUM CHLORIDE 0.9 % IJ SOLN: 3.0000 mL | Freq: Two times a day (BID) | INTRAMUSCULAR | Status: DC

## 2012-04-30 MED ORDER — TECHNETIUM TC 99M TETROFOSMIN IV KIT
30.0000 | PACK | Freq: Once | INTRAVENOUS | Status: AC | PRN
  Administered 2012-04-30: 30 via INTRAVENOUS

## 2012-04-30 MED ORDER — SODIUM CHLORIDE 0.9 % IV SOLN
1.0000 mL/kg/h | INTRAVENOUS | Status: DC
  Administered 2012-04-30: 1 mL/kg/h via INTRAVENOUS

## 2012-04-30 MED ORDER — REGADENOSON 0.4 MG/5ML IV SOLN
INTRAVENOUS | Status: AC
  Administered 2012-04-30: 0.4 mg via INTRAVENOUS
  Filled 2012-04-30: qty 5

## 2012-04-30 MED ORDER — SODIUM CHLORIDE 0.9 % IV SOLN: 250.0000 mL | INTRAVENOUS | Status: DC | PRN

## 2012-04-30 MED ORDER — SODIUM CHLORIDE 0.9 % IJ SOLN: 3.0000 mL | INTRAMUSCULAR | Status: DC | PRN

## 2012-04-30 MED ORDER — REGADENOSON 0.4 MG/5ML IV SOLN
0.4000 mg | Freq: Once | INTRAVENOUS | Status: AC
  Administered 2012-04-30: 0.4 mg via INTRAVENOUS

## 2012-04-30 MED ORDER — DIAZEPAM 5 MG PO TABS
5.0000 mg | ORAL_TABLET | ORAL | Status: AC
  Administered 2012-05-01: 5 mg via ORAL
  Filled 2012-04-30: qty 1

## 2012-04-30 MED ORDER — TECHNETIUM TC 99M TETROFOSMIN IV KIT
10.0000 | PACK | Freq: Once | INTRAVENOUS | Status: AC | PRN
  Administered 2012-04-30: 10 via INTRAVENOUS

## 2012-04-30 NOTE — Progress Notes (Signed)
ANTICOAGULATION CONSULT NOTE - Follow Up Consult  Pharmacy Consult for heparin Indication: ACS/STEMI  Allergies  Allergen Reactions  . Penicillins Hives    Patient Measurements: Height: 5\' 7"  (170.2 cm) Weight: 184 lb 6.4 oz (83.643 kg) (b scale) IBW/kg (Calculated) : 66.1  Heparin Dosing Weight: 80 kg  Vital Signs: Temp: 98.2 F (36.8 C) (08/22 1300) Temp src: Oral (08/22 1300) BP: 142/84 mmHg (08/22 1300) Pulse Rate: 79  (08/22 1300)  Labs:  Basename 04/30/12 0550 04/29/12 2235 04/29/12 1712 04/29/12 1306 04/29/12 1248  HGB 13.8 -- -- 14.6 --  HCT 43.0 -- -- 43.0 41.6  PLT 170 -- -- -- 167  APTT -- -- -- -- --  LABPROT 12.9 -- -- -- --  INR 0.95 -- -- -- --  HEPARINUNFRC 0.42 0.35 -- -- --  CREATININE 1.37* -- -- 1.40* --  CKTOTAL 132 145 272* -- --  CKMB 1.9 2.2 2.4 -- --  TROPONINI <0.30 <0.30 <0.30 -- --    Estimated Creatinine Clearance: 58.5 ml/min (by C-G formula based on Cr of 1.37).   Medical History: Past Medical History  Diagnosis Date  . Arthritis   . CHF (congestive heart failure)   . Depression   . GERD (gastroesophageal reflux disease)   . Hyperlipidemia   . Hypertension   . Cervical spine disease     Medications:  Scheduled:     . aspirin  324 mg Oral Once  . aspirin  324 mg Oral NOW   Or  . aspirin  300 mg Rectal NOW  . aspirin EC  81 mg Oral Daily  . atorvastatin  10 mg Oral q1800  . clopidogrel  75 mg Oral Q breakfast  . heparin  4,000 Units Intravenous Once  . metoprolol tartrate  12.5 mg Oral BID  . regadenoson  0.4 mg Intravenous Once  . sodium chloride  3 mL Intravenous Q12H   Infusions:     . heparin 1,050 Units/hr (04/29/12 2316)    Assessment: 61 yo male with chest pain to r/o MI  on  heparin drip 1050 uts/hr HL 0.42 at goal 0.3-0.7.  No bleeding noted, cbc stable, VSS started on asa, clopidogrel, statin and metoprolol on admission.  No home meds.  Plan cath 8/22. Goal of Therapy:  Heparin level 0.3-0.7  units/ml Monitor platelets by anticoagulation protocol: Yes   Plan:  1. Continue heparin to 1050 units/hr 2. Daily CBC, heparin level  Marcelino Scot 04/30/2012,2:25 PM

## 2012-04-30 NOTE — Progress Notes (Signed)
Subjective:  Feeling better. Occasional chest discomfort. Tolerated Lexiscan Myoview stress test without symptoms.  Objective:  Vital Signs in the last 24 hours: Temp:  [97.1 F (36.2 C)-98.2 F (36.8 C)] 98.2 F (36.8 C) (08/22 1300) Pulse Rate:  [64-80] 79  (08/22 1300) Cardiac Rhythm:  [-] Normal sinus rhythm (08/22 0900) Resp:  [18] 18  (08/22 0542) BP: (115-142)/(60-84) 142/84 mmHg (08/22 1300) SpO2:  [99 %] 99 % (08/22 1300) Weight:  [184 lb 6.4 oz (83.643 kg)] 184 lb 6.4 oz (83.643 kg) (08/22 0542)  Physical Exam: BP Readings from Last 1 Encounters:  04/30/12 142/84    Wt Readings from Last 1 Encounters:  04/30/12 184 lb 6.4 oz (83.643 kg)    Weight change:   HEENT: [place]/AT, Eyes-Brown, PERL, EOMI, Conjunctiva-Pink, Sclera-Non-icteric Neck: No JVD, No bruit, Trachea midline. Lungs:  Clear, Bilateral. Cardiac:  Regular rhythm, normal S1 and S2, no S3.  Abdomen:  Soft, non-tender. Extremities:  No edema present. No cyanosis. No clubbing. CNS: AxOx3, Cranial nerves grossly intact, moves all 4 extremities. Right handed. Skin: Warm and dry.   Intake/Output from previous day: 08/21 0701 - 08/22 0700 In: 480 [P.O.:480] Out: 1250 [Urine:1250]    Lab Results: BMET    Component Value Date/Time   NA 143 04/30/2012 0550   K 3.7 04/30/2012 0550   CL 108 04/30/2012 0550   CO2 26 04/30/2012 0550   GLUCOSE 80 04/30/2012 0550   BUN 8 04/30/2012 0550   CREATININE 1.37* 04/30/2012 0550   CALCIUM 9.1 04/30/2012 0550   GFRNONAA 54* 04/30/2012 0550   GFRAA 63* 04/30/2012 0550   CBC    Component Value Date/Time   WBC 5.0 04/30/2012 0550   RBC 5.18 04/30/2012 0550   HGB 13.8 04/30/2012 0550   HCT 43.0 04/30/2012 0550   PLT 170 04/30/2012 0550   MCV 83.0 04/30/2012 0550   MCH 26.6 04/30/2012 0550   MCHC 32.1 04/30/2012 0550   RDW 14.3 04/30/2012 0550   LYMPHSABS 1.9 04/29/2012 1248   MONOABS 0.5 04/29/2012 1248   EOSABS 0.0 04/29/2012 1248   BASOSABS 0.0 04/29/2012 1248   CARDIAC  ENZYMES Lab Results  Component Value Date   CKTOTAL 132 04/30/2012   CKMB 1.9 04/30/2012   TROPONINI <0.30 04/30/2012    Assessment/Plan:  Patient Active Hospital Problem List: Chest pain  CAD  CABG  Stent in LCX  COPD  Hypertension  Anxiety  C. Cath in AM for some anteroapical ischemia    LOS: 1 day    Orpah Cobb  MD  04/30/2012, 6:10 PM

## 2012-04-30 NOTE — Progress Notes (Signed)
Utilization Review Completed.  

## 2012-05-01 ENCOUNTER — Encounter (HOSPITAL_COMMUNITY)
Admission: EM | Disposition: A | Discharge status: Home / Self Care | Payer: Self-pay | Source: Home / Self Care | Attending: Cardiovascular Disease

## 2012-05-01 HISTORY — PX: LEFT HEART CATHETERIZATION WITH CORONARY/GRAFT ANGIOGRAM: SHX5450

## 2012-05-01 LAB — SURGICAL PCR SCREEN
MRSA, PCR: NEGATIVE
Staphylococcus aureus: NEGATIVE

## 2012-05-01 LAB — BASIC METABOLIC PANEL
Chloride: 106 mEq/L (ref 96–112)
Creatinine, Ser: 1.41 mg/dL — ABNORMAL HIGH (ref 0.50–1.35)
GFR calc Af Amer: 61 mL/min — ABNORMAL LOW (ref >90)
Potassium: 3.9 mEq/L (ref 3.5–5.1)
Sodium: 141 mEq/L (ref 135–145)

## 2012-05-01 LAB — CBC
Platelets: 168 10*3/uL (ref 150–400)
RDW: 14.2 % (ref 11.5–15.5)
WBC: 4.9 10*3/uL (ref 4.0–10.5)

## 2012-05-01 LAB — PROTIME-INR
INR: 0.95 (ref 0.00–1.49)
Prothrombin Time: 12.9 seconds (ref 11.6–15.2)

## 2012-05-01 LAB — HEPARIN LEVEL (UNFRACTIONATED): Heparin Unfractionated: 0.34 IU/mL (ref 0.30–0.70)

## 2012-05-01 SURGERY — LEFT HEART CATHETERIZATION WITH CORONARY/GRAFT ANGIOGRAM
Anesthesia: Moderate Sedation | Laterality: Right

## 2012-05-01 SURGERY — LEFT HEART CATHETERIZATION WITH CORONARY ANGIOGRAM
Anesthesia: LOCAL

## 2012-05-01 MED ORDER — OXYCODONE-ACETAMINOPHEN 5-325 MG PO TABS: 1.0000 | ORAL_TABLET | ORAL | Status: DC | PRN

## 2012-05-01 MED ORDER — SODIUM CHLORIDE 0.9 % IV SOLN
1.0000 mL/kg/h | INTRAVENOUS | Status: AC
  Administered 2012-05-01: 1 mL/kg/h via INTRAVENOUS

## 2012-05-01 MED ORDER — NITROGLYCERIN 0.2 MG/ML ON CALL CATH LAB
INTRAVENOUS | Status: AC
  Filled 2012-05-01: qty 1

## 2012-05-01 MED ORDER — ASPIRIN 81 MG PO CHEW
CHEWABLE_TABLET | ORAL | Status: AC
  Administered 2012-05-02: 81 mg via ORAL
  Filled 2012-05-01: qty 4

## 2012-05-01 MED ORDER — ASPIRIN 81 MG PO CHEW
81.0000 mg | CHEWABLE_TABLET | Freq: Every day | ORAL | Status: DC
  Administered 2012-05-02: 81 mg via ORAL
  Filled 2012-05-01: qty 1

## 2012-05-01 MED ORDER — MIDAZOLAM HCL 2 MG/2ML IJ SOLN
INTRAMUSCULAR | Status: AC
  Filled 2012-05-01: qty 2

## 2012-05-01 MED ORDER — CALCIUM CARBONATE ANTACID 500 MG PO CHEW
400.0000 mg | CHEWABLE_TABLET | Freq: Two times a day (BID) | ORAL | Status: DC | PRN
  Filled 2012-05-01: qty 2

## 2012-05-01 MED ORDER — CALCIUM CARBONATE ANTACID 750 MG PO CHEW: 2.0000 | CHEWABLE_TABLET | Freq: Two times a day (BID) | ORAL | Status: DC | PRN

## 2012-05-01 MED ORDER — HEPARIN (PORCINE) IN NACL 2-0.9 UNIT/ML-% IJ SOLN
INTRAMUSCULAR | Status: AC
  Filled 2012-05-01: qty 3000

## 2012-05-01 MED ORDER — FENTANYL CITRATE 0.05 MG/ML IJ SOLN
INTRAMUSCULAR | Status: AC
  Filled 2012-05-01: qty 2

## 2012-05-01 MED ORDER — ACETAMINOPHEN 325 MG PO TABS: 650.0000 mg | ORAL_TABLET | ORAL | Status: DC | PRN

## 2012-05-01 MED ORDER — LIDOCAINE HCL (PF) 1 % IJ SOLN
INTRAMUSCULAR | Status: AC
  Filled 2012-05-01: qty 30

## 2012-05-01 MED ORDER — CLOPIDOGREL BISULFATE 75 MG PO TABS
75.0000 mg | ORAL_TABLET | Freq: Every day | ORAL | Status: DC
  Administered 2012-05-02: 75 mg via ORAL

## 2012-05-01 MED ORDER — DIAZEPAM 2 MG PO TABS: 2.0000 mg | ORAL_TABLET | ORAL | Status: DC | PRN

## 2012-05-01 NOTE — Progress Notes (Signed)
Pt's Rt. Groin cath dsg. D&I.  Level 0.  Up ambulating after bedrest with no problem.  Will continue to monitor.  Reported to oncoming nurse.  Amanda Pea, Charity fundraiser.

## 2012-05-01 NOTE — Progress Notes (Signed)
Subjective:  No chest pain. Here for c. Cath.  Objective:  Vital Signs in the last 24 hours: Temp:  [97.6 F (36.4 C)-98.2 F (36.8 C)] 97.6 F (36.4 C) (08/23 0605) Pulse Rate:  [60-79] 60  (08/23 0605) Cardiac Rhythm:  [-] Normal sinus rhythm (08/22 2000) Resp:  [18] 18  (08/23 0605) BP: (125-142)/(60-86) 140/86 mmHg (08/23 0605) SpO2:  [96 %-100 %] 100 % (08/23 0605) Weight:  [83.326 kg (183 lb 11.2 oz)] 83.326 kg (183 lb 11.2 oz) (08/23 9562)  Physical Exam: BP Readings from Last 1 Encounters:  05/01/12 140/86    Wt Readings from Last 1 Encounters:  05/01/12 83.326 kg (183 lb 11.2 oz)    Weight change: -2.174 kg (-4 lb 12.7 oz)  HEENT: [place]/AT, Eyes-Brown, PERL, EOMI, Conjunctiva-Pink, Sclera-Non-icteric Neck: No JVD, No bruit, Trachea midline. Lungs:  Clear, Bilateral. Cardiac:  Regular rhythm, normal S1 and S2, no S3.  Abdomen:  Soft, non-tender. Extremities:  No edema present. No cyanosis. No clubbing. CNS: AxOx3, Cranial nerves grossly intact, moves all 4 extremities. Right handed. Skin: Warm and dry.   Intake/Output from previous day: 08/22 0701 - 08/23 0700 In: 1773.9 [P.O.:720; I.V.:1053.9] Out: 1725 [Urine:1725]    Lab Results: BMET    Component Value Date/Time   NA 141 05/01/2012 0545   K 3.9 05/01/2012 0545   CL 106 05/01/2012 0545   CO2 26 05/01/2012 0545   GLUCOSE 92 05/01/2012 0545   BUN 13 05/01/2012 0545   CREATININE 1.41* 05/01/2012 0545   CALCIUM 9.2 05/01/2012 0545   GFRNONAA 52* 05/01/2012 0545   GFRAA 61* 05/01/2012 0545   CBC    Component Value Date/Time   WBC 4.9 05/01/2012 0545   RBC 5.29 05/01/2012 0545   HGB 14.3 05/01/2012 0545   HCT 44.2 05/01/2012 0545   PLT 168 05/01/2012 0545   MCV 83.6 05/01/2012 0545   MCH 27.0 05/01/2012 0545   MCHC 32.4 05/01/2012 0545   RDW 14.2 05/01/2012 0545   LYMPHSABS 1.9 04/29/2012 1248   MONOABS 0.5 04/29/2012 1248   EOSABS 0.0 04/29/2012 1248   BASOSABS 0.0 04/29/2012 1248   CARDIAC ENZYMES Lab Results    Component Value Date   CKTOTAL 132 04/30/2012   CKMB 1.9 04/30/2012   TROPONINI <0.30 04/30/2012    Assessment/Plan:  Patient Active Hospital Problem List:  Chest pain  CAD  CABG  Stent in LCX  COPD  Hypertension  Anxiety  C. Cath today.   LOS: 2 days    Orpah Cobb  MD  05/01/2012, 7:26 AM

## 2012-05-01 NOTE — CV Procedure (Signed)
PROCEDURE:  Left heart catheterization with selective coronary angiography, bypass study and left ventriculogram.  CLINICAL HISTORY:  This is a 61 years old male with recurrent chest pain, CAD, CABG and abnormal stress test..  The risks, benefits, and details of the procedure were explained to the patient.  The patient verbalized understanding and wanted to proceed.  Informed written consent was obtained.  PROCEDURE TECHNIQUE:  The patient was approached from the right femoral artery using a 5 French short sheath.  Left coronary angiography was done using a Judkins L4 guide catheter.  Right coronary angiography was done using a Judkins R4 guide catheter. SVG to OM and LIMA to LAD angiography was done using a Judkins R4 guide cathter. SVG to diagonal angiography was done using bypass graft guide catheter. Left ventriculography was done using a pigtail catheter.    CONTRAST:  Total of 75 cc.  COMPLICATIONS:  None.  At the end of the procedure a manual device was used for hemostasis.    HEMODYNAMICS:  Aortic pressure was 115/66; LV pressure was 112/5; LVEDP 11.  There was no gradient between the left ventricle and aorta.    ANGIOGRAM/CORONARY ARTERIOGRAM:   The left main coronary artery is gradual taper distally resulting in 30 % narrowing..  The left anterior descending artery is Proximal 50 % narrowing with mild disease of the distal half of the vessel. Competitive flow via LIMA to LAD. Osteal 80-90 % stenosis of diagonal 2 but the vessel is less than 2 mm in diameter. Retrograde flow in SVG to diagonal 1 with diffuse narrowing.  The left circumflex artery has proximal 60-70 % stenosis, mid vessel 50-60 % stenosis with good flow. Mild disease of OM with retrograde flow in SVG to OM.  The right coronary artery has mild diffuse disease.  SVG to OM is widely patent.  SVG to diagonal 1 is widely patent.  LIMA to LAD is widely patent with competitive flow from native vessel.  LEFT  VENTRICULOGRAM:  Left ventricular angiogram was done in the 30 RAO projection and revealed basal and mid inferior wall hypokinesia and mild systolic dysfunction with an estimated ejection fraction of 55 %.  LVEDP was 11 mmHg.  IMPRESSION OF HEART CATHETERIZATION:   1. Mild left main coronary artery disease. 2. Moderate left anterior descending artery disease and severe disease of Diagonal 2. 3. Moderate to severe left circumflex artery and mild disease of OM branche. 4. Mild diffuse disease of right coronary artery. 5. Mild left ventricular systolic dysfunction.  LVEDP 11 mmHg.  Ejection fraction 55%.  RECOMMENDATION:   Medical treatment with medications and life style modification.Marland Kitchen

## 2012-05-01 NOTE — Progress Notes (Signed)
@  0645 Heparin drip stopped as per on call to cath lab

## 2012-05-02 MED ORDER — NITROGLYCERIN 0.4 MG SL SUBL: 0.4000 mg | SUBLINGUAL_TABLET | SUBLINGUAL | Status: DC | PRN

## 2012-05-02 MED ORDER — ALPRAZOLAM 0.25 MG PO TABS: 0.2500 mg | ORAL_TABLET | Freq: Every evening | ORAL | Status: AC | PRN

## 2012-05-02 MED ORDER — LISINOPRIL 2.5 MG PO TABS: 2.5000 mg | ORAL_TABLET | Freq: Every day | ORAL | Status: AC

## 2012-05-02 MED ORDER — SIMVASTATIN 20 MG PO TABS: 20.0000 mg | ORAL_TABLET | Freq: Every evening | ORAL | Status: DC

## 2012-05-02 MED ORDER — METOPROLOL TARTRATE 12.5 MG HALF TABLET: 25.0000 mg | ORAL_TABLET | Freq: Two times a day (BID) | ORAL | Status: DC

## 2012-05-02 MED ORDER — CLOPIDOGREL BISULFATE 75 MG PO TABS: 75.0000 mg | ORAL_TABLET | Freq: Every day | ORAL | Status: AC

## 2012-05-02 MED ORDER — ASPIRIN 81 MG PO CHEW: 81.0000 mg | CHEWABLE_TABLET | Freq: Every day | ORAL | Status: AC

## 2012-05-02 MED ORDER — ISOSORBIDE MONONITRATE ER 30 MG PO TB24: 30.0000 mg | ORAL_TABLET | Freq: Every day | ORAL | Status: DC

## 2012-05-02 NOTE — Discharge Summary (Signed)
Physician Discharge Summary  Patient ID: Marcus [name] MRN: [id number] DOB/AGE: Mar 14, 1951 61 y.o.  Admit date: 04/29/2012 Discharge date: 05/02/2012  Admission Diagnoses: Chest pain  CAD  CABG  Stent in LCX  COPD  Hypertension  Anxiety  Discharge Diagnoses:  Active Problems:  * Chest pain* Principle diagnosis CAD, native vessel CABG  Stent in LCX  COPD  Hypertension  Anxiety    Discharged Condition: good  Hospital Course: 61 years old male with recurrent chest pain underwent coronary and bypass graft angiography. Native vessel disease was seen but all the bypass grafts were working. He had stopped all his medications prior to hospitalization. These will be resumed and he will be rechecked in 2 weeks.  Consults: None  Significant Diagnostic Studies: Labs: Normal CBC,  Electrolytes and borderline high creatinine.   Coronary and bypass graft angiography:   1. Mild left main coronary artery disease.  2 .Moderate left anterior descending artery disease and severe disease of Diagonal 2.   3. Moderate to severe left circumflex artery and mild disease of OM branche.   4. Mild diffuse disease of right coronary artery.   5. Mild left ventricular systolic dysfunction. LVEDP 11 mmHg. Ejection fraction 55%.  EKG: Sinus bradycardia with ST & T wave abnormality, consider inferior ischemia. Abnormal ECG No significant change since last tracing  Treatments: analgesia: Vicodin, cardiac meds: lisinopril (Zestril), Metoprolol, Aspirin, Plavix and Simvastatin and procedures: Coronary and bypass graft angiography.  Discharge Exam: Blood pressure 101/59, pulse 76, temperature 98.4 F (36.9 C), temperature source Oral, resp. rate 18, height 5\' 7"  (1.702 m), weight 83.416 kg (183 lb 14.4 oz), SpO2 100.00%.  General appearance: alert and cooperative  Head: Normocephalic, without obvious abnormality, atraumatic  Eyes: Brown, conjunctivae/corneas clear. PERRL, EOM's intact.  Throat: lips,  mucosa, and tongue normal; teeth and gums normal  Neck: no adenopathy, no carotid bruit, no JVD, supple, symmetrical, trachea midline and thyroid not enlarged.  Resp: clear to auscultation bilaterally  Chest wall: no tenderness  Cardio: regular rate and rhythm, S1, S2 normal, no murmur, click, rub or gallop  GI: soft, non-tender; bowel sounds normal; no masses, no organomegaly  Extremities: extremities normal, atraumatic, no cyanosis or edema  Skin: Skin color, texture, turgor normal. No rashes or lesions  Neurologic: Alert and oriented X 3, normal strength and tone. Normal coordination and gait  Disposition: Home, self care.   Medication List  As of 05/02/2012 10:08 AM   TAKE these medications         ALPRAZolam 0.25 MG tablet   Commonly known as: XANAX   Take 1 tablet (0.25 mg total) by mouth at bedtime as needed for sleep.      aspirin 81 MG chewable tablet   Chew 1 tablet (81 mg total) by mouth daily.      calcium carbonate 750 MG chewable tablet   Commonly known as: TUMS EX   Chew 2 tablets by mouth 2 (two) times daily as needed. For heartburn      clopidogrel 75 MG tablet   Commonly known as: PLAVIX   Take 1 tablet (75 mg total) by mouth daily with breakfast.      isosorbide mononitrate 30 MG 24 hr tablet   Commonly known as: IMDUR   Take 1 tablet (30 mg total) by mouth daily.      lisinopril 2.5 MG tablet   Commonly known as: PRINIVIL,ZESTRIL   Take 1 tablet (2.5 mg total) by mouth daily.  metoprolol tartrate 12.5 mg Tabs   Commonly known as: LOPRESSOR   Take 1 tablet (25 mg total) by mouth 2 (two) times daily.      nitroGLYCERIN 0.4 MG SL tablet   Commonly known as: NITROSTAT   Place 1 tablet (0.4 mg total) under the tongue every 5 (five) minutes x 3 doses as needed for chest pain.      simvastatin 20 MG tablet   Commonly known as: ZOCOR   Take 1 tablet (20 mg total) by mouth every evening.           Follow-up Information    Follow up with  [facility] S, MD. Schedule an appointment as soon as possible for a visit in 1 week.   Contact information:   [address] [place] [address] [phone]          Signed: Ricki Rodriguez 05/02/2012, 10:08 AM

## 2013-02-13 ENCOUNTER — Emergency Department (HOSPITAL_COMMUNITY): Payer: Medicaid Other

## 2013-02-13 ENCOUNTER — Observation Stay (HOSPITAL_COMMUNITY)
Admission: EM | Admit: 2013-02-13 | Discharge: 2013-02-13 | Disposition: A | Discharge status: Home / Self Care | Payer: Medicaid Other | Attending: Cardiovascular Disease | Admitting: Cardiovascular Disease

## 2013-02-13 ENCOUNTER — Encounter (HOSPITAL_COMMUNITY): Payer: Self-pay | Admitting: *Deleted

## 2013-02-13 ENCOUNTER — Observation Stay (HOSPITAL_COMMUNITY): Payer: Medicaid Other

## 2013-02-13 DIAGNOSIS — F411 Generalized anxiety disorder: Secondary | ICD-10-CM | POA: Insufficient documentation

## 2013-02-13 DIAGNOSIS — R079 Chest pain, unspecified: Secondary | ICD-10-CM

## 2013-02-13 DIAGNOSIS — J449 Chronic obstructive pulmonary disease, unspecified: Secondary | ICD-10-CM | POA: Insufficient documentation

## 2013-02-13 DIAGNOSIS — Z951 Presence of aortocoronary bypass graft: Secondary | ICD-10-CM | POA: Insufficient documentation

## 2013-02-13 DIAGNOSIS — R0789 Other chest pain: Principal | ICD-10-CM | POA: Insufficient documentation

## 2013-02-13 DIAGNOSIS — Z9861 Coronary angioplasty status: Secondary | ICD-10-CM | POA: Insufficient documentation

## 2013-02-13 DIAGNOSIS — I2510 Atherosclerotic heart disease of native coronary artery without angina pectoris: Secondary | ICD-10-CM | POA: Insufficient documentation

## 2013-02-13 DIAGNOSIS — I509 Heart failure, unspecified: Secondary | ICD-10-CM | POA: Insufficient documentation

## 2013-02-13 DIAGNOSIS — I10 Essential (primary) hypertension: Secondary | ICD-10-CM | POA: Insufficient documentation

## 2013-02-13 DIAGNOSIS — I200 Unstable angina: Secondary | ICD-10-CM

## 2013-02-13 DIAGNOSIS — J4489 Other specified chronic obstructive pulmonary disease: Secondary | ICD-10-CM | POA: Insufficient documentation

## 2013-02-13 LAB — BASIC METABOLIC PANEL
CO2: 25 mEq/L (ref 19–32)
Chloride: 106 mEq/L (ref 96–112)
Chloride: 103 mEq/L (ref 96–112)
GFR calc Af Amer: 60 mL/min — ABNORMAL LOW (ref >90)
GFR calc non Af Amer: 52 mL/min — ABNORMAL LOW (ref >90)
Potassium: 3.6 mEq/L (ref 3.5–5.1)
Sodium: 144 mEq/L (ref 135–145)
Sodium: 139 mEq/L (ref 135–145)

## 2013-02-13 LAB — LIPID PANEL
HDL: 63 mg/dL (ref >39)
LDL Cholesterol: 110 mg/dL — ABNORMAL HIGH (ref 0–99)
Triglycerides: 70 mg/dL (ref <150)
VLDL: 14 mg/dL (ref 0–40)

## 2013-02-13 LAB — CBC
MCHC: 32.8 g/dL (ref 30.0–36.0)
Platelets: 186 10*3/uL (ref 150–400)
Platelets: 168 10*3/uL (ref 150–400)
RBC: 5.4 MIL/uL (ref 4.22–5.81)
RDW: 14.2 % (ref 11.5–15.5)
WBC: 7.8 10*3/uL (ref 4.0–10.5)
WBC: 5.7 10*3/uL (ref 4.0–10.5)

## 2013-02-13 LAB — HEPARIN LEVEL (UNFRACTIONATED): Heparin Unfractionated: 0.79 IU/mL — ABNORMAL HIGH (ref 0.30–0.70)

## 2013-02-13 LAB — HEPATIC FUNCTION PANEL
ALT: 19 U/L (ref 0–53)
Albumin: 4.4 g/dL (ref 3.5–5.2)
Alkaline Phosphatase: 115 U/L (ref 39–117)
Total Protein: 7.7 g/dL (ref 6.0–8.3)

## 2013-02-13 LAB — LIPASE, BLOOD: Lipase: 37 U/L (ref 11–59)

## 2013-02-13 LAB — PROTIME-INR
INR: 0.94 (ref 0.00–1.49)
Prothrombin Time: 12.5 seconds (ref 11.6–15.2)

## 2013-02-13 MED ORDER — ONDANSETRON HCL 4 MG/2ML IJ SOLN: 4.0000 mg | Freq: Four times a day (QID) | INTRAMUSCULAR | Status: DC | PRN

## 2013-02-13 MED ORDER — ACETAMINOPHEN 325 MG PO TABS: 650.0000 mg | ORAL_TABLET | ORAL | Status: DC | PRN

## 2013-02-13 MED ORDER — ASPIRIN 81 MG PO CHEW
324.0000 mg | CHEWABLE_TABLET | Freq: Once | ORAL | Status: AC
  Administered 2013-02-13: 324 mg via ORAL
  Filled 2013-02-13: qty 4

## 2013-02-13 MED ORDER — ISOSORBIDE MONONITRATE ER 30 MG PO TB24
30.0000 mg | ORAL_TABLET | Freq: Every day | ORAL | Status: DC
Start: 2013-02-13 — End: 2013-02-13
  Administered 2013-02-13: 30 mg via ORAL
  Filled 2013-02-13: qty 1

## 2013-02-13 MED ORDER — SODIUM CHLORIDE 0.9 % IJ SOLN
3.0000 mL | Freq: Two times a day (BID) | INTRAMUSCULAR | Status: DC
Start: 2013-02-13 — End: 2013-02-13
  Administered 2013-02-13: 3 mL via INTRAVENOUS

## 2013-02-13 MED ORDER — TECHNETIUM TC 99M SESTAMIBI - CARDIOLITE
30.0000 | Freq: Once | INTRAVENOUS | Status: AC | PRN
  Administered 2013-02-13: 30 via INTRAVENOUS

## 2013-02-13 MED ORDER — REGADENOSON 0.4 MG/5ML IV SOLN
0.4000 mg | Freq: Once | INTRAVENOUS | Status: AC
  Administered 2013-02-13: 0.4 mg via INTRAVENOUS
  Filled 2013-02-13: qty 5

## 2013-02-13 MED ORDER — NITROGLYCERIN 0.4 MG SL SUBL: 0.4000 mg | SUBLINGUAL_TABLET | SUBLINGUAL | Status: DC | PRN

## 2013-02-13 MED ORDER — HEPARIN (PORCINE) IN NACL 100-0.45 UNIT/ML-% IJ SOLN
1000.0000 [IU]/h | INTRAMUSCULAR | Status: DC
  Administered 2013-02-13 (×2): 1000 [IU]/h via INTRAVENOUS
  Filled 2013-02-13: qty 250

## 2013-02-13 MED ORDER — NITROGLYCERIN 0.4 MG SL SUBL
0.4000 mg | SUBLINGUAL_TABLET | SUBLINGUAL | Status: AC | PRN
  Administered 2013-02-13 (×3): 0.4 mg via SUBLINGUAL
  Filled 2013-02-13: qty 75

## 2013-02-13 MED ORDER — METOPROLOL TARTRATE 25 MG PO TABS
25.0000 mg | ORAL_TABLET | Freq: Two times a day (BID) | ORAL | Status: DC
Start: 2013-02-13 — End: 2013-02-13
  Administered 2013-02-13: 25 mg via ORAL
  Filled 2013-02-13 (×2): qty 1

## 2013-02-13 MED ORDER — SODIUM CHLORIDE 0.9 % IV SOLN: 250.0000 mL | INTRAVENOUS | Status: DC | PRN

## 2013-02-13 MED ORDER — LISINOPRIL 2.5 MG PO TABS
2.5000 mg | ORAL_TABLET | Freq: Every day | ORAL | Status: DC
  Administered 2013-02-13: 2.5 mg via ORAL
  Filled 2013-02-13: qty 1

## 2013-02-13 MED ORDER — HEPARIN BOLUS VIA INFUSION
4000.0000 [IU] | Freq: Once | INTRAVENOUS | Status: AC
  Administered 2013-02-13: 4000 [IU] via INTRAVENOUS
  Filled 2013-02-13: qty 4000

## 2013-02-13 MED ORDER — CLOPIDOGREL BISULFATE 75 MG PO TABS
75.0000 mg | ORAL_TABLET | Freq: Every day | ORAL | Status: DC
  Administered 2013-02-13: 75 mg via ORAL
  Filled 2013-02-13: qty 1

## 2013-02-13 MED ORDER — SIMVASTATIN 20 MG PO TABS
20.0000 mg | ORAL_TABLET | Freq: Every evening | ORAL | Status: DC
  Filled 2013-02-13: qty 1

## 2013-02-13 MED ORDER — ASPIRIN 325 MG PO TABS: 325.0000 mg | ORAL_TABLET | Freq: Once | ORAL | Status: DC

## 2013-02-13 MED ORDER — ACETAMINOPHEN 325 MG PO TABS
650.0000 mg | ORAL_TABLET | Freq: Once | ORAL | Status: AC
  Administered 2013-02-13: 650 mg via ORAL
  Filled 2013-02-13: qty 2

## 2013-02-13 MED ORDER — TECHNETIUM TC 99M SESTAMIBI - CARDIOLITE
10.0000 | Freq: Once | INTRAVENOUS | Status: AC | PRN
  Administered 2013-02-13: 08:00:00 10 via INTRAVENOUS

## 2013-02-13 MED ORDER — SODIUM CHLORIDE 0.9 % IJ SOLN: 3.0000 mL | INTRAMUSCULAR | Status: DC | PRN

## 2013-02-13 MED ORDER — ASPIRIN EC 81 MG PO TBEC: 81.0000 mg | DELAYED_RELEASE_TABLET | Freq: Every day | ORAL | Status: DC

## 2013-02-13 NOTE — ED Notes (Signed)
Dr. Algie Coffer here to see pt for adm

## 2013-02-13 NOTE — Progress Notes (Signed)
ANTICOAGULATION CONSULT NOTE - Initial Consult  Pharmacy Consult for heparin Indication: chest pain/ACS  Allergies  Allergen Reactions  . Penicillins Hives    Patient Measurements: Height: 5\' 7"  (170.2 cm) Weight: 176 lb 3.2 oz (79.924 kg) IBW/kg (Calculated) : 66.1  Vital Signs: Temp: 97.5 F (36.4 C) (06/07 0542) Temp src: Oral (06/07 0542) BP: 128/69 mmHg (06/07 0542) Pulse Rate: 71 (06/07 0542)  Labs:  Recent Labs  02/13/13 0313  HGB 14.9  HCT 45.0  PLT 186  CREATININE 1.47*    Estimated Creatinine Clearance: 52.8 ml/min (by C-G formula based on Cr of 1.47).   Medical History: Past Medical History  Diagnosis Date  . Arthritis   . CHF (congestive heart failure)   . Depression   . GERD (gastroesophageal reflux disease)   . Hyperlipidemia   . Hypertension   . Cervical spine disease     Medications:  Prescriptions prior to admission  Medication Sig Dispense Refill  . aspirin 81 MG chewable tablet Chew 1 tablet (81 mg total) by mouth daily.      . calcium carbonate (TUMS EX) 750 MG chewable tablet Chew 2 tablets by mouth 2 (two) times daily as needed. For heartburn      . clopidogrel (PLAVIX) 75 MG tablet Take 1 tablet (75 mg total) by mouth daily with breakfast.  30 tablet  3  . isosorbide mononitrate (IMDUR) 30 MG 24 hr tablet Take 30 mg by mouth daily.      Marland Kitchen lisinopril (ZESTRIL) 2.5 MG tablet Take 1 tablet (2.5 mg total) by mouth daily.  30 tablet  1  . metoprolol tartrate (LOPRESSOR) 12.5 mg TABS Take 12.5 mg by mouth 2 (two) times daily.      . simvastatin (ZOCOR) 20 MG tablet Take 1 tablet (20 mg total) by mouth every evening.  30 tablet  1  . nitroGLYCERIN (NITROSTAT) 0.4 MG SL tablet Place 1 tablet (0.4 mg total) under the tongue every 5 (five) minutes x 3 doses as needed for chest pain.  50 tablet  1   Scheduled:  . [START ON 02/14/2013] aspirin EC  81 mg Oral Daily  . clopidogrel  75 mg Oral Q breakfast  . isosorbide mononitrate  30 mg Oral Daily   . lisinopril  2.5 mg Oral Daily  . metoprolol tartrate  25 mg Oral BID  . simvastatin  20 mg Oral QPM  . sodium chloride  3 mL Intravenous Q12H    Assessment: 62yo male c/o non-radiating substernal CP associated w/ SOB, initial troponin negative but w/ significant cardiac hx, to begin heparin.  Goal of Therapy:  Heparin level 0.3-0.7 units/ml Monitor platelets by anticoagulation protocol: Yes   Plan:  Will give heparin 4000 units IV bolus followed by gtt at 1000 units/hr and monitor heparin levels and CBC.  Vernard Gambles, PharmD, BCPS  02/13/2013,6:04 AM

## 2013-02-13 NOTE — Discharge Summary (Signed)
Physician Discharge Summary  Patient ID: Marcus [name] MRN: [id number] DOB/AGE: Sep 14, 1950 62 y.o.  Admit date: 02/13/2013 Discharge date: 02/13/2013  Admission Diagnoses: Chest pain  CAD  Stent in LCX  CABG  Hypertension  COPD  Anxiety  Discharge Diagnoses:  Principle Problem: * Chest pain, non-cardiac * CAD  Stent in LCX  CABG  Hypertension  COPD  Anxiety   Discharged Condition: fair  Hospital Course: 62 years old male with known CAD and CABG has substernal chest pain, dull, non-radiating but with shortness of breath. He underwent nuclear stress test without reversible ischemia. He will see GI doctor on out patient basis for possible esophageal dysmotility.  Consults: None  Significant Diagnostic Studies: labs: Normal CBC and BMP and lipase.  EKG-SR, non-specific ST-T abnormality.  CXR-No acute cardiopulmonary process.  Lexiscan Cardiolite Stress test showed  1. No fixed or reversible perfusion defects on today's examination.  2. Estimated ejection fraction 57% which is slightly increased compared to 51% on 04/30/2012.   Treatments: cardiac meds: lisinopril (Zestril) and metoprolol and anticoagulation: ASA, Plavix and heparin  Discharge Exam: Blood pressure 123/54, pulse 69, temperature 98.2 F (36.8 C), temperature source Oral, resp. rate 18, height 5\' 7"  (1.702 m), weight 79.924 kg (176 lb 3.2 oz), SpO2 94.00%.   Disposition: 01-Home or Self Care     Medication List    TAKE these medications       aspirin 81 MG chewable tablet  Chew 1 tablet (81 mg total) by mouth daily.     calcium carbonate 750 MG chewable tablet  Commonly known as:  TUMS EX  Chew 2 tablets by mouth 2 (two) times daily as needed. For heartburn     clopidogrel 75 MG tablet  Commonly known as:  PLAVIX  Take 1 tablet (75 mg total) by mouth daily with breakfast.     isosorbide mononitrate 30 MG 24 hr tablet  Commonly known as:  IMDUR  Take 30 mg by mouth daily.     lisinopril  2.5 MG tablet  Commonly known as:  ZESTRIL  Take 1 tablet (2.5 mg total) by mouth daily.     metoprolol tartrate 12.5 mg Tabs  Commonly known as:  LOPRESSOR  Take 12.5 mg by mouth 2 (two) times daily.     nitroGLYCERIN 0.4 MG SL tablet  Commonly known as:  NITROSTAT  Place 1 tablet (0.4 mg total) under the tongue every 5 (five) minutes x 3 doses as needed for chest pain.     simvastatin 20 MG tablet  Commonly known as:  ZOCOR  Take 1 tablet (20 mg total) by mouth every evening.           Follow-up Information   Follow up with [facility] S, MD. Schedule an appointment as soon as possible for a visit in 1 week.   Contact information:   [address] [place] [address] [phone]       Signed: Ricki Rodriguez 02/13/2013, 2:06 PM

## 2013-02-13 NOTE — ED Notes (Signed)
The pt has had chest pain foe 2 weeks with lightjeadness

## 2013-02-13 NOTE — ED Provider Notes (Signed)
History     CSN: [id number]  Arrival date & time 02/13/13  0303   First MD Initiated Contact with Patient 02/13/13 0310      Chief Complaint  Patient presents with  . Chest Pain     Patient is a 62 y.o. male presenting with chest pain. The history is provided by the patient.  Chest Pain Pain location:  Substernal area Pain quality: pressure   Pain radiates to:  Does not radiate Pain radiates to the back: no   Pain severity:  Moderate Onset quality:  Gradual Duration:  15 minutes Timing:  Intermittent Progression:  Worsening Chronicity:  Recurrent Relieved by:  Nothing Worsened by:  Nothing tried Associated symptoms: abdominal pain, diaphoresis, dizziness and heartburn   Associated symptoms: no back pain, no fever, no shortness of breath, no syncope, not vomiting and no weakness   Risk factors: coronary artery disease     Past Medical History  Diagnosis Date  . Arthritis   . CHF (congestive heart failure)   . Depression   . GERD (gastroesophageal reflux disease)   . Hyperlipidemia   . Hypertension   . Cervical spine disease     Past Surgical History  Procedure Laterality Date  . Triple bypass    . Breast lump removal      Family History  Problem Relation Age of Onset  . Kidney disease Mother   . Asthma Mother   . Hypertension Father     History  Substance Use Topics  . Smoking status: Former Games developer  . Smokeless tobacco: Not on file  . Alcohol Use: Yes     Comment: occ      Review of Systems  Constitutional: Positive for diaphoresis. Negative for fever.  Respiratory: Negative for shortness of breath.   Cardiovascular: Positive for chest pain. Negative for syncope.  Gastrointestinal: Positive for heartburn and abdominal pain. Negative for vomiting and blood in stool.  Musculoskeletal: Negative for back pain.  Neurological: Positive for dizziness. Negative for weakness.  All other systems reviewed and are negative.    Allergies   Penicillins  Home Medications   Current Outpatient Rx  Name  Route  Sig  Dispense  Refill  . aspirin 81 MG chewable tablet   Oral   Chew 1 tablet (81 mg total) by mouth daily.         . calcium carbonate (TUMS EX) 750 MG chewable tablet   Oral   Chew 2 tablets by mouth 2 (two) times daily as needed. For heartburn         . clopidogrel (PLAVIX) 75 MG tablet   Oral   Take 1 tablet (75 mg total) by mouth daily with breakfast.   30 tablet   3   . isosorbide mononitrate (IMDUR) 30 MG 24 hr tablet   Oral   Take 1 tablet (30 mg total) by mouth daily.   30 tablet   1   . lisinopril (ZESTRIL) 2.5 MG tablet   Oral   Take 1 tablet (2.5 mg total) by mouth daily.   30 tablet   1   . metoprolol tartrate (LOPRESSOR) 12.5 mg TABS   Oral   Take 1 tablet (25 mg total) by mouth 2 (two) times daily.   60 tablet   3   . nitroGLYCERIN (NITROSTAT) 0.4 MG SL tablet   Sublingual   Place 1 tablet (0.4 mg total) under the tongue every 5 (five) minutes x 3 doses as needed for chest pain.  50 tablet   1   . simvastatin (ZOCOR) 20 MG tablet   Oral   Take 1 tablet (20 mg total) by mouth every evening.   30 tablet   1     BP 148/87  Pulse 90  Temp(Src) 97.7 F (36.5 C) (Oral)  Resp 24  SpO2 100%  Physical Exam CONSTITUTIONAL: Well developed/well nourished HEAD: Normocephalic/atraumatic EYES: EOMI/PERRL ENMT: Mucous membranes moist NECK: supple no meningeal signs SPINE:entire spine nontender CV: S1/S2 noted, no murmurs/rubs/gallops noted LUNGS: Lungs are clear to auscultation bilaterally, no apparent distress ABDOMEN: soft, nontender, no rebound or guarding GU:no cva tenderness NEURO: Pt is awake/alert, moves all extremitiesx4 EXTREMITIES: pulses normal, full ROM SKIN: warm, color normal PSYCH: no abnormalities of mood noted  ED Course  Procedures   Labs Reviewed  CBC  BASIC METABOLIC PANEL   1:61 AM Pt here for CP Reports somewhat similar to prior episodes  of cardiac CP EKG shows new t wave inversion Will call cardiology once troponin resulted 4:11 AM Troponin negative Pt now with CP at 4/10 after NTG I have discussed with dr Algie Coffer, who will see patient for likely admission  MDM  Nursing notes including past medical history and social history reviewed and considered in documentation xrays reviewed and considered Labs/vital reviewed and considered Previous records reviewed and considered - h/o CAD        Date: 02/13/2013  Rate: 87  Rhythm: normal sinus rhythm  QRS Axis: normal  Intervals: normal  ST/T Wave abnormalities: nonspecific ST changes, t wave inversions noted  Conduction Disutrbances:none  Narrative Interpretation:   Old EKG Reviewed: changes noted    Joya Gaskins, MD 02/13/13 [phone]

## 2013-02-13 NOTE — H&P (Signed)
Marcus [name] is an 62 y.o. male.   Chief Complaint: Chest pain HPI: 62 years old male with known CAD and CABG has substernal chest pain, dull, non-radiating but with shortness of breath.  Past Medical History  Diagnosis Date  . Arthritis   . CHF (congestive heart failure)   . Depression   . GERD (gastroesophageal reflux disease)   . Hyperlipidemia   . Hypertension   . Cervical spine disease       Past Surgical History  Procedure Laterality Date  . Triple bypass    . Breast lump removal      Family History  Problem Relation Age of Onset  . Kidney disease Mother   . Asthma Mother   . Hypertension Father    Social History:  reports that he has quit smoking. He does not have any smokeless tobacco history on file. He reports that  drinks alcohol. He reports that he uses illicit drugs (Marijuana).  Allergies:  Allergies  Allergen Reactions  . Penicillins Hives     (Not in a hospital admission)  Results for orders placed during the hospital encounter of 02/13/13 (from the past 48 hour(s))  CBC     Status: None   Collection Time    02/13/13  3:13 AM      Result Value Range   WBC 7.8  4.0 - 10.5 K/uL   RBC 5.40  4.22 - 5.81 MIL/uL   Hemoglobin 14.9  13.0 - 17.0 g/dL   HCT 40.9  81.1 - 91.4 %   MCV 83.3  78.0 - 100.0 fL   MCH 27.6  26.0 - 34.0 pg   MCHC 33.1  30.0 - 36.0 g/dL   RDW 78.2  95.6 - 21.3 %   Platelets 186  150 - 400 K/uL  BASIC METABOLIC PANEL     Status: Abnormal   Collection Time    02/13/13  3:13 AM      Result Value Range   Sodium 144  135 - 145 mEq/L   Potassium 4.0  3.5 - 5.1 mEq/L   Chloride 106  96 - 112 mEq/L   CO2 25  19 - 32 mEq/L   Glucose, Bld 91  70 - 99 mg/dL   BUN 16  6 - 23 mg/dL   Creatinine, Ser 0.86 (*) 0.50 - 1.35 mg/dL   Calcium 57.8  8.4 - 46.9 mg/dL   GFR calc non Af Amer 49 (*) >90 mL/min   GFR calc Af Amer 57 (*) >90 mL/min   Comment:            The eGFR has been calculated     using the CKD EPI equation.     This  calculation has not been     validated in all clinical     situations.     eGFR's persistently     <90 mL/min signify     possible Chronic Kidney Disease.  HEPATIC FUNCTION PANEL     Status: None   Collection Time    02/13/13  3:13 AM      Result Value Range   Total Protein 7.7  6.0 - 8.3 g/dL   Albumin 4.4  3.5 - 5.2 g/dL   AST 26  0 - 37 U/L   ALT 19  0 - 53 U/L   Alkaline Phosphatase 115  39 - 117 U/L   Total Bilirubin 0.5  0.3 - 1.2 mg/dL   Bilirubin, Direct 0.1  0.0 - 0.3  mg/dL   Indirect Bilirubin 0.4  0.3 - 0.9 mg/dL  LIPASE, BLOOD     Status: None   Collection Time    02/13/13  3:13 AM      Result Value Range   Lipase 37  11 - 59 U/L  POCT I-STAT TROPONIN I     Status: None   Collection Time    02/13/13  3:26 AM      Result Value Range   Troponin i, poc 0.01  0.00 - 0.08 ng/mL   Comment 3            Comment: Due to the release kinetics of cTnI,     a negative result within the first hours     of the onset of symptoms does not rule out     myocardial infarction with certainty.     If myocardial infarction is still suspected,     repeat the test at appropriate intervals.   Dg Chest Port 1 View  02/13/2013   *RADIOLOGY REPORT*  Clinical Data: Chest pain.  PORTABLE CHEST - 1 VIEW  Comparison: Chest radiograph performed 04/29/2012  Findings: The lungs are well-aerated and clear.  There is no evidence of focal opacification, pleural effusion or pneumothorax.  The cardiomediastinal silhouette is normal in size; the patient is status post median sternotomy, with evidence of prior CABG.  No acute osseous abnormalities are seen.  IMPRESSION: No acute cardiopulmonary process seen.   Original Report Authenticated By: Tonia Ghent, M.D.    @ROS @ Constitutional: Positive for diaphoresis. Negative for fever.  Respiratory: Negative for shortness of breath.  Cardiovascular: Positive for chest pain. Negative for syncope.  Gastrointestinal: Positive for heartburn and abdominal pain.  Negative for vomiting and blood in stool.  Musculoskeletal: Negative for back pain.  Neurological: Positive for dizziness. Negative for weakness.  All other systems reviewed and are negative.   Physical Exam  Blood pressure 125/77, pulse 90, temperature 97.7 F (36.5 C), temperature source Oral, resp. rate 20, SpO2 100.00%.  CONSTITUTIONAL: Well developed/well nourished  HEAD: Normocephalic/atraumatic  EYES: Brown eyes, conj-pink, sclera-white. Wears glasses. EOMI/PERRL  ENMT: Mucous membranes moist  NECK: supple. CV: S1/S2 noted. No rubs/gallops noted  II/VI systolic murmur. LUNGS: Lungs are clear to auscultation bilaterally, no apparent distress  ABDOMEN: soft, nontender, no rebound or guarding  NEURO: Pt is awake/alert, moves all extremitiesx4  EXTREMITIES: pulses normal, full ROM  SKIN: warm, color normal  PSYCH: no abnormalities of mood noted  Assessment/Plan Chest pain CAD Stent in LCX CABG Hypertension COPD Anxiety  Place in observation. Nuclear stress test if enzymes are normal.  Sohail Capraro S 02/13/2013, 4:45 AM

## 2013-03-15 ENCOUNTER — Emergency Department (HOSPITAL_COMMUNITY)
Admission: EM | Admit: 2013-03-15 | Discharge: 2013-03-16 | Disposition: A | Discharge status: Home / Self Care | Payer: Medicaid Other | Attending: Emergency Medicine | Admitting: Emergency Medicine

## 2013-03-15 ENCOUNTER — Encounter (HOSPITAL_COMMUNITY): Payer: Self-pay | Admitting: Adult Health

## 2013-03-15 DIAGNOSIS — R000 Tachycardia, unspecified: Secondary | ICD-10-CM | POA: Insufficient documentation

## 2013-03-15 DIAGNOSIS — Z8659 Personal history of other mental and behavioral disorders: Secondary | ICD-10-CM | POA: Insufficient documentation

## 2013-03-15 DIAGNOSIS — R42 Dizziness and giddiness: Secondary | ICD-10-CM | POA: Insufficient documentation

## 2013-03-15 DIAGNOSIS — Z87891 Personal history of nicotine dependence: Secondary | ICD-10-CM | POA: Insufficient documentation

## 2013-03-15 DIAGNOSIS — M129 Arthropathy, unspecified: Secondary | ICD-10-CM | POA: Insufficient documentation

## 2013-03-15 DIAGNOSIS — Z7982 Long term (current) use of aspirin: Secondary | ICD-10-CM | POA: Insufficient documentation

## 2013-03-15 DIAGNOSIS — I10 Essential (primary) hypertension: Secondary | ICD-10-CM | POA: Insufficient documentation

## 2013-03-15 DIAGNOSIS — I509 Heart failure, unspecified: Secondary | ICD-10-CM | POA: Insufficient documentation

## 2013-03-15 DIAGNOSIS — R55 Syncope and collapse: Secondary | ICD-10-CM

## 2013-03-15 DIAGNOSIS — R0602 Shortness of breath: Secondary | ICD-10-CM | POA: Insufficient documentation

## 2013-03-15 DIAGNOSIS — K219 Gastro-esophageal reflux disease without esophagitis: Secondary | ICD-10-CM | POA: Insufficient documentation

## 2013-03-15 DIAGNOSIS — Z7902 Long term (current) use of antithrombotics/antiplatelets: Secondary | ICD-10-CM | POA: Insufficient documentation

## 2013-03-15 DIAGNOSIS — Z880 Allergy status to penicillin: Secondary | ICD-10-CM | POA: Insufficient documentation

## 2013-03-15 DIAGNOSIS — R609 Edema, unspecified: Secondary | ICD-10-CM | POA: Insufficient documentation

## 2013-03-15 DIAGNOSIS — E860 Dehydration: Secondary | ICD-10-CM

## 2013-03-15 DIAGNOSIS — Z79899 Other long term (current) drug therapy: Secondary | ICD-10-CM | POA: Insufficient documentation

## 2013-03-15 DIAGNOSIS — E785 Hyperlipidemia, unspecified: Secondary | ICD-10-CM | POA: Insufficient documentation

## 2013-03-15 DIAGNOSIS — R079 Chest pain, unspecified: Secondary | ICD-10-CM

## 2013-03-15 DIAGNOSIS — Z8739 Personal history of other diseases of the musculoskeletal system and connective tissue: Secondary | ICD-10-CM | POA: Insufficient documentation

## 2013-03-15 MED ORDER — SODIUM CHLORIDE 0.9 % IV BOLUS (SEPSIS)
1000.0000 mL | Freq: Once | INTRAVENOUS | Status: AC
  Administered 2013-03-16: 1000 mL via INTRAVENOUS

## 2013-03-15 NOTE — ED Notes (Signed)
Pt reports dizziness since Thursday, today began having central chest pain described as sharp associated with fatigue, diaphoresis. Denies SOB. HR 120s.

## 2013-03-16 ENCOUNTER — Emergency Department (HOSPITAL_COMMUNITY): Payer: Medicaid Other

## 2013-03-16 LAB — CBC WITH DIFFERENTIAL/PLATELET
Basophils Absolute: 0 10*3/uL (ref 0.0–0.1)
Basophils Relative: 0 % (ref 0–1)
HCT: 41.6 % (ref 39.0–52.0)
MCHC: 32.2 g/dL (ref 30.0–36.0)
Monocytes Absolute: 0.5 10*3/uL (ref 0.1–1.0)
Neutro Abs: 4.7 10*3/uL (ref 1.7–7.7)
Neutrophils Relative %: 76 % (ref 43–77)
Platelets: 182 10*3/uL (ref 150–400)
RDW: 14.5 % (ref 11.5–15.5)

## 2013-03-16 LAB — BASIC METABOLIC PANEL
CO2: 28 mEq/L (ref 19–32)
Chloride: 111 mEq/L (ref 96–112)
Creatinine, Ser: 1.28 mg/dL (ref 0.50–1.35)
GFR calc Af Amer: 68 mL/min — ABNORMAL LOW (ref >90)
Potassium: 3.7 mEq/L (ref 3.5–5.1)
Sodium: 145 mEq/L (ref 135–145)

## 2013-03-16 LAB — COMPREHENSIVE METABOLIC PANEL
ALT: 12 U/L (ref 0–53)
Alkaline Phosphatase: 109 U/L (ref 39–117)
Chloride: 111 mEq/L (ref 96–112)
Creatinine, Ser: 1.46 mg/dL — ABNORMAL HIGH (ref 0.50–1.35)
GFR calc Af Amer: 58 mL/min — ABNORMAL LOW (ref >90)
GFR calc non Af Amer: 50 mL/min — ABNORMAL LOW (ref >90)
Sodium: 147 mEq/L — ABNORMAL HIGH (ref 135–145)

## 2013-03-16 LAB — TROPONIN I: Troponin I: <0.3 ng/mL (ref <0.30)

## 2013-03-16 LAB — PRO B NATRIURETIC PEPTIDE: Pro B Natriuretic peptide (BNP): 150.8 pg/mL — ABNORMAL HIGH (ref 0–125)

## 2013-03-16 MED ORDER — SODIUM CHLORIDE 0.9 % IV BOLUS (SEPSIS)
1000.0000 mL | Freq: Once | INTRAVENOUS | Status: AC
  Administered 2013-03-16: 1000 mL via INTRAVENOUS

## 2013-03-16 NOTE — Progress Notes (Signed)
8:14 AM Pt was seen by Dr. Algie Coffer, his cardiologist, who discharged him home.

## 2013-03-16 NOTE — ED Notes (Signed)
Plan of care is for pt to be hydrated and another troponin to be drawn at 0600 and Dr. Jodelle Green to consult in the am.

## 2013-03-16 NOTE — Consult Note (Signed)
Subjective:  Chest pain and dizziness.   Objective:  Vital Signs in the last 24 hours: Temp:  [98.3 F (36.8 C)] 98.3 F (36.8 C) (07/07 2352) Pulse Rate:  [62-120] 69 (07/08 0530) Cardiac Rhythm:  [-]  Resp:  [16-32] 21 (07/08 0530) BP: (127-167)/(67-111) 127/69 mmHg (07/08 0530) SpO2:  [95 %-98 %] 96 % (07/08 0530) Weight:  [84.369 kg (186 lb)] 84.369 kg (186 lb) (07/07 2352)  Physical Exam: BP Readings from Last 1 Encounters:  03/16/13 127/69     Wt Readings from Last 1 Encounters:  03/15/13 84.369 kg (186 lb)    Weight change:   HEENT: [place]/AT, Eyes-Brown, PERL, EOMI, Conjunctiva-Pink, Sclera-Non-icteric Neck: No JVD, No bruit, Trachea midline. Lungs:  Clear, Bilateral. Cardiac:  Regular rhythm, normal S1 and S2, no S3.  Abdomen:  Soft, non-tender. Extremities:  No edema present. No cyanosis. No clubbing. CNS: AxOx3, Cranial nerves grossly intact, moves all 4 extremities. Right handed. Skin: Warm and dry.   Intake/Output from previous day: 07/07 0701 - 07/08 0700 In: 1000 [I.V.:1000] Out: 400 [Urine:400]    Lab Results: BMET    Component Value Date/Time   NA 145 03/16/2013 0540   K 3.7 03/16/2013 0540   CL 111 03/16/2013 0540   CO2 28 03/16/2013 0540   GLUCOSE 91 03/16/2013 0540   BUN 8 03/16/2013 0540   CREATININE 1.28 03/16/2013 0540   CALCIUM 8.6 03/16/2013 0540   GFRNONAA 58* 03/16/2013 0540   GFRAA 68* 03/16/2013 0540   CBC    Component Value Date/Time   WBC 6.2 03/15/2013 2355   RBC 4.97 03/15/2013 2355   HGB 13.4 03/15/2013 2355   HCT 41.6 03/15/2013 2355   PLT 182 03/15/2013 2355   MCV 83.7 03/15/2013 2355   MCH 27.0 03/15/2013 2355   MCHC 32.2 03/15/2013 2355   RDW 14.5 03/15/2013 2355   LYMPHSABS 1.0 03/15/2013 2355   MONOABS 0.5 03/15/2013 2355   EOSABS 0.0 03/15/2013 2355   BASOSABS 0.0 03/15/2013 2355   CARDIAC ENZYMES Lab Results  Component Value Date   CKTOTAL 132 04/30/2012   CKMB 1.9 04/30/2012   TROPONINI <0.30 03/16/2013    Scheduled Meds: Continuous Infusions: PRN  Meds:.  EKG-Sinus tachycardia, RA enlargement, IRBBB  Assessment/Plan: Dehydration-improved MI-ruled out Dizziness due to dehydration Chest pain CAD CABG Back pain Hypertension Anxiety  Drink extra fluids, use NTG SL as needed + home medications. Follow up in 2 weeks.   LOS: 1 day    Orpah Cobb  MD  03/16/2013, 7:58 AM

## 2013-03-16 NOTE — ED Provider Notes (Addendum)
History    CSN: [id number] Arrival date & time 03/15/13  2344  First MD Initiated Contact with Patient 03/15/13 2351     Chief Complaint  Patient presents with  . Chest Pain   (Consider location/radiation/quality/duration/timing/severity/associated sxs/prior Treatment) The history is provided by the patient.  Marcus [name] is a 62 y.o. male hx of CHF, HL, triple bypass, here with chest pain and dizziness. Dizziness for the last 5 days. He described it as lightheadedness and feeling like he is going to pass out. He also has intermittent chest pain which he described as pain from his toes all the way wrapped around to his chest and associated with shortness of breath. Denies diaphoresis and has been eating normally. He was admitted a month ago for similar symptoms and had nl stress test.   Past Medical History  Diagnosis Date  . Arthritis   . CHF (congestive heart failure)   . Depression   . GERD (gastroesophageal reflux disease)   . Hyperlipidemia   . Hypertension   . Cervical spine disease    Past Surgical History  Procedure Laterality Date  . Triple bypass    . Breast lump removal     Family History  Problem Relation Age of Onset  . Kidney disease Mother   . Asthma Mother   . Hypertension Father    History  Substance Use Topics  . Smoking status: Former Games developer  . Smokeless tobacco: Not on file  . Alcohol Use: Yes     Comment: occ    Review of Systems  Respiratory: Positive for shortness of breath.   Cardiovascular: Positive for chest pain.  All other systems reviewed and are negative.    Allergies  Penicillins  Home Medications   Current Outpatient Rx  Name  Route  Sig  Dispense  Refill  . aspirin 81 MG chewable tablet   Oral   Chew 1 tablet (81 mg total) by mouth daily.         . calcium carbonate (TUMS EX) 750 MG chewable tablet   Oral   Chew 2 tablets by mouth 2 (two) times daily as needed. For heartburn         . clopidogrel (PLAVIX) 75 MG  tablet   Oral   Take 1 tablet (75 mg total) by mouth daily with breakfast.   30 tablet   3   . isosorbide mononitrate (IMDUR) 30 MG 24 hr tablet   Oral   Take 30 mg by mouth daily.         Marland Kitchen lisinopril (ZESTRIL) 2.5 MG tablet   Oral   Take 1 tablet (2.5 mg total) by mouth daily.   30 tablet   1   . metoprolol tartrate (LOPRESSOR) 12.5 mg TABS   Oral   Take 12.5 mg by mouth 2 (two) times daily.         . nitroGLYCERIN (NITROSTAT) 0.4 MG SL tablet   Sublingual   Place 1 tablet (0.4 mg total) under the tongue every 5 (five) minutes x 3 doses as needed for chest pain.   50 tablet   1   . simvastatin (ZOCOR) 20 MG tablet   Oral   Take 1 tablet (20 mg total) by mouth every evening.   30 tablet   1    BP 127/69  Pulse 69  Temp(Src) 98.3 F (36.8 C) (Oral)  Resp 21  Ht 5\' 7"  (1.702 m)  Wt 186 lb (84.369 kg)  BMI 29.12  kg/m2  SpO2 96% Physical Exam  Nursing note and vitals reviewed. Constitutional: He is oriented to person, place, and time. He appears well-developed.  HENT:  Head: Normocephalic.  Mouth/Throat: Oropharynx is clear and moist.  Eyes: Conjunctivae are normal. Pupils are equal, round, and reactive to light.  Neck: Normal range of motion. Neck supple.  Cardiovascular: Regular rhythm and normal heart sounds.   Tachycardic   Pulmonary/Chest: Effort normal and breath sounds normal. No respiratory distress. He has no wheezes. He has no rales.  Abdominal: Soft. Bowel sounds are normal. He exhibits no distension. There is no tenderness. There is no rebound and no guarding.  Musculoskeletal: Normal range of motion.  Trace edema bilaterally   Neurological: He is alert and oriented to person, place, and time.  Skin: Skin is warm and dry.  Psychiatric: He has a normal mood and affect. His behavior is normal. Judgment and thought content normal.    ED Course  Procedures (including critical care time) Labs Reviewed  COMPREHENSIVE METABOLIC PANEL - Abnormal;  Notable for the following:    Sodium 147 (*)    Glucose, Bld 115 (*)    Creatinine, Ser 1.46 (*)    GFR calc non Af Amer 50 (*)    GFR calc Af Amer 58 (*)    All other components within normal limits  PRO B NATRIURETIC PEPTIDE - Abnormal; Notable for the following:    Pro B Natriuretic peptide (BNP) 150.8 (*)    All other components within normal limits  BASIC METABOLIC PANEL - Abnormal; Notable for the following:    GFR calc non Af Amer 58 (*)    GFR calc Af Amer 68 (*)    All other components within normal limits  CBC WITH DIFFERENTIAL  D-DIMER, QUANTITATIVE  TROPONIN I  POCT I-STAT TROPONIN I   Dg Chest Portable 1 View  03/16/2013   *RADIOLOGY REPORT*  Clinical Data: Chest pain.  PORTABLE CHEST - 1 VIEW  Comparison: 02/13/2013  Findings: Prior CABG. Heart and mediastinal contours are within normal limits.  No focal opacities or effusions.  No acute bony abnormality.  IMPRESSION: No active cardiopulmonary disease.   Original Report Authenticated By: Charlett Nose, M.D.   No diagnosis found.   Date: 03/16/2013  Rate: 124  Rhythm: sinus tachycardia  QRS Axis: normal  Intervals: normal  ST/T Wave abnormalities: nonspecific ST changes  Conduction Disutrbances:none  Narrative Interpretation: heart rate faster, otherwise no change   Old EKG Reviewed: unchanged    MDM  NOLON YELLIN is a 62 y.o. male here with chest pain, near syncope, tachycardia. Given hx of triple bypass will need to r/o ACS. He does have recent stress test that is normal so may need to call Dr. Algie Coffer to see if he needs more imaging. Also will consider possible PE given recent hospitalization and tachycardia. Will check d-dimer and give IVF and reassess.   1:10 AM Labs significant for mild hypernatremia 147, trop neg x 1. BNp at baseline. Not tachy after IVF. D-dimer neg. I called Dr. Algie Coffer. He wants a 6 hr trop and will see patient in AM. Will also repeat BMP in AM.   7 AM 6 hr trop neg. BMP showed  hypernatremia improved with IVF. Cr also improved with IVF. I think symptoms likely related to dehydration. I signed out to Dr. Ignacia Palma to f/u cardiology consult with Dr. Algie Coffer.     Richardean Canal, MD 03/16/13 1610  Richardean Canal, MD 03/16/13 [phone]

## 2013-10-28 ENCOUNTER — Emergency Department (HOSPITAL_COMMUNITY)
Admission: EM | Admit: 2013-10-28 | Discharge: 2013-10-28 | Disposition: A | Discharge status: Home / Self Care | Payer: Medicaid Other | Attending: Emergency Medicine | Admitting: Emergency Medicine

## 2013-10-28 ENCOUNTER — Encounter (HOSPITAL_COMMUNITY): Payer: Self-pay | Admitting: Emergency Medicine

## 2013-10-28 DIAGNOSIS — M26629 Arthralgia of temporomandibular joint, unspecified side: Secondary | ICD-10-CM | POA: Insufficient documentation

## 2013-10-28 DIAGNOSIS — R519 Headache, unspecified: Secondary | ICD-10-CM

## 2013-10-28 DIAGNOSIS — Z79899 Other long term (current) drug therapy: Secondary | ICD-10-CM | POA: Insufficient documentation

## 2013-10-28 DIAGNOSIS — I10 Essential (primary) hypertension: Secondary | ICD-10-CM | POA: Insufficient documentation

## 2013-10-28 DIAGNOSIS — Z880 Allergy status to penicillin: Secondary | ICD-10-CM | POA: Insufficient documentation

## 2013-10-28 DIAGNOSIS — R51 Headache: Secondary | ICD-10-CM

## 2013-10-28 DIAGNOSIS — M129 Arthropathy, unspecified: Secondary | ICD-10-CM | POA: Insufficient documentation

## 2013-10-28 DIAGNOSIS — R42 Dizziness and giddiness: Secondary | ICD-10-CM | POA: Insufficient documentation

## 2013-10-28 DIAGNOSIS — Z862 Personal history of diseases of the blood and blood-forming organs and certain disorders involving the immune mechanism: Secondary | ICD-10-CM | POA: Insufficient documentation

## 2013-10-28 DIAGNOSIS — Z87891 Personal history of nicotine dependence: Secondary | ICD-10-CM | POA: Insufficient documentation

## 2013-10-28 DIAGNOSIS — Z8639 Personal history of other endocrine, nutritional and metabolic disease: Secondary | ICD-10-CM | POA: Insufficient documentation

## 2013-10-28 DIAGNOSIS — I509 Heart failure, unspecified: Secondary | ICD-10-CM | POA: Insufficient documentation

## 2013-10-28 DIAGNOSIS — Z8739 Personal history of other diseases of the musculoskeletal system and connective tissue: Secondary | ICD-10-CM | POA: Insufficient documentation

## 2013-10-28 DIAGNOSIS — Z7982 Long term (current) use of aspirin: Secondary | ICD-10-CM | POA: Insufficient documentation

## 2013-10-28 DIAGNOSIS — K219 Gastro-esophageal reflux disease without esophagitis: Secondary | ICD-10-CM | POA: Insufficient documentation

## 2013-10-28 DIAGNOSIS — Z8659 Personal history of other mental and behavioral disorders: Secondary | ICD-10-CM | POA: Insufficient documentation

## 2013-10-28 LAB — BASIC METABOLIC PANEL
BUN: 13 mg/dL (ref 6–23)
CALCIUM: 9.2 mg/dL (ref 8.4–10.5)
CO2: 25 mEq/L (ref 19–32)
Chloride: 106 mEq/L (ref 96–112)
Creatinine, Ser: 1.4 mg/dL — ABNORMAL HIGH (ref 0.50–1.35)
GFR calc Af Amer: 60 mL/min — ABNORMAL LOW (ref >90)
GFR, EST NON AFRICAN AMERICAN: 52 mL/min — AB (ref >90)
GLUCOSE: 93 mg/dL (ref 70–99)
Potassium: 4.6 mEq/L (ref 3.7–5.3)
SODIUM: 144 meq/L (ref 137–147)

## 2013-10-28 LAB — CBC WITH DIFFERENTIAL/PLATELET
BASOS ABS: 0 10*3/uL (ref 0.0–0.1)
BASOS PCT: 0 % (ref 0–1)
EOS ABS: 0 10*3/uL (ref 0.0–0.7)
EOS PCT: 1 % (ref 0–5)
HCT: 42.1 % (ref 39.0–52.0)
Hemoglobin: 13.7 g/dL (ref 13.0–17.0)
Lymphocytes Relative: 25 % (ref 12–46)
Lymphs Abs: 2 10*3/uL (ref 0.7–4.0)
MCH: 27.8 pg (ref 26.0–34.0)
MCHC: 32.5 g/dL (ref 30.0–36.0)
MCV: 85.6 fL (ref 78.0–100.0)
Monocytes Absolute: 0.7 10*3/uL (ref 0.1–1.0)
Monocytes Relative: 9 % (ref 3–12)
Neutro Abs: 5.3 10*3/uL (ref 1.7–7.7)
Neutrophils Relative %: 66 % (ref 43–77)
PLATELETS: 190 10*3/uL (ref 150–400)
RBC: 4.92 MIL/uL (ref 4.22–5.81)
RDW: 14 % (ref 11.5–15.5)
WBC: 8 10*3/uL (ref 4.0–10.5)

## 2013-10-28 LAB — SEDIMENTATION RATE: Sed Rate: 31 mm/hr — ABNORMAL HIGH (ref 0–16)

## 2013-10-28 MED ORDER — IBUPROFEN 400 MG PO TABS
600.0000 mg | ORAL_TABLET | Freq: Once | ORAL | Status: AC
  Administered 2013-10-28: 600 mg via ORAL
  Filled 2013-10-28 (×2): qty 1

## 2013-10-28 MED ORDER — HYDROCODONE-ACETAMINOPHEN 5-325 MG PO TABS: 1.0000 | ORAL_TABLET | Freq: Four times a day (QID) | ORAL | Status: DC | PRN

## 2013-10-28 MED ORDER — HYDROCODONE-ACETAMINOPHEN 5-325 MG PO TABS
1.0000 | ORAL_TABLET | Freq: Once | ORAL | Status: AC
  Administered 2013-10-28: 1 via ORAL
  Filled 2013-10-28: qty 1

## 2013-10-28 MED ORDER — IBUPROFEN 600 MG PO TABS: 600.0000 mg | ORAL_TABLET | Freq: Three times a day (TID) | ORAL | Status: DC

## 2013-10-28 MED ORDER — ACETAMINOPHEN 325 MG PO TABS
650.0000 mg | ORAL_TABLET | Freq: Once | ORAL | Status: AC
  Administered 2013-10-28: 650 mg via ORAL
  Filled 2013-10-28: qty 2

## 2013-10-28 NOTE — ED Notes (Signed)
Pt reports on Tuesday morning he started to experience and HA, it hasn't gone away but it has gotten better, pt took 800 mg of ibuprofen yesterday. Pt reports the dizziness and facial pain also started 3 days, sts when he lays down he doesn't feel as dizzy. When he gets up and is moving around feels like his head is "floating". No facial droop, no slurred speech. Denies numbness/tingling in face/arms/legs. Pt has no difficulty walking. Nad, skin warm and dry, resp e/u.

## 2013-10-28 NOTE — ED Provider Notes (Signed)
This patient was seen in conjunction with the resident physician, Dr. Colbert CoyerArrieta.  The documentation is accurate and reflects the patient's evaluation.  On my exam, the patient was in no distress, denying any visual loss, neurologic dysfunction.  Marcus Munchobert Kruze Atchley, MD 10/28/13 [id number]

## 2013-10-28 NOTE — ED Notes (Signed)
Pt c/o vertigo and left sided facial pain. Reports it feels like a tooth ache but starts to the left of his eye and radiates down cheek into left side of chin. Denies problems with teeth recently. Also c/o slight HA, started 3 days ago. No slurred speech, equal hand grips, no difficulty walking. Nad, skin warm and dry, resp e/u.

## 2013-10-28 NOTE — ED Provider Notes (Signed)
CSN: [id number]     Arrival date & time 10/28/13  1700 History   First MD Initiated Contact with Patient 10/28/13 1010     Chief Complaint  Patient presents with  . Dizziness  . Facial Pain      Patient is a 63 y.o. male presenting with headaches.  Headache Pain location:  L temporal Quality:  Dull Radiates to:  Does not radiate Severity currently:  5/10 Severity at highest:  7/10 Onset quality:  Gradual Duration:  3 days Timing:  Constant Progression:  Worsening Chronicity:  New Similar to prior headaches: no   Relieved by:  None tried Exacerbated by: palpation of L face. Ineffective treatments:  NSAIDs Associated symptoms: ear pain and facial pain   Associated symptoms: no abdominal pain, no back pain, no blurred vision, no congestion, no cough, no diarrhea, no dizziness, no drainage, no pain, no fatigue, no fever, no focal weakness, no hearing loss, no loss of balance, no nausea, no near-syncope, no neck pain, no neck stiffness, no numbness, no paresthesias, no photophobia, no seizures, no sinus pressure, no sore throat, no swollen glands, no syncope, no tingling, no URI, no visual change, no vomiting and no weakness     Past Medical History  Diagnosis Date  . Arthritis   . CHF (congestive heart failure)   . Depression   . GERD (gastroesophageal reflux disease)   . Hyperlipidemia   . Hypertension   . Cervical spine disease    Past Surgical History  Procedure Laterality Date  . Triple bypass    . Breast lump removal     Family History  Problem Relation Age of Onset  . Kidney disease Mother   . Asthma Mother   . Hypertension Father    History  Substance Use Topics  . Smoking status: Former Research scientist (life sciences)  . Smokeless tobacco: Not on file  . Alcohol Use: Yes     Comment: occ    Review of Systems  Constitutional: Negative for fever, activity change, appetite change and fatigue.  HENT: Positive for ear pain. Negative for congestion, hearing loss, postnasal drip,  rhinorrhea, sinus pressure and sore throat.   Eyes: Negative for blurred vision, photophobia, pain and redness.  Respiratory: Negative for cough, chest tightness and shortness of breath.   Cardiovascular: Negative for chest pain, palpitations, syncope and near-syncope.  Gastrointestinal: Negative for nausea, vomiting, abdominal pain, diarrhea and abdominal distention.  Genitourinary: Negative for dysuria, flank pain and difficulty urinating.  Musculoskeletal: Negative for back pain, neck pain and neck stiffness.  Skin: Negative for rash and wound.  Neurological: Positive for headaches. Negative for dizziness, focal weakness, seizures, light-headedness, numbness, paresthesias and loss of balance.  Hematological: Negative for adenopathy.  Psychiatric/Behavioral: Negative for behavioral problems, confusion and agitation.      Allergies  Penicillins  Home Medications   Current Outpatient Rx  Name  Route  Sig  Dispense  Refill  . aspirin 81 MG tablet   Oral   Take 81 mg by mouth daily.         . isosorbide mononitrate (IMDUR) 30 MG 24 hr tablet   Oral   Take 30 mg by mouth daily.         . metoprolol tartrate (LOPRESSOR) 12.5 mg TABS   Oral   Take 12.5 mg by mouth 2 (two) times daily.         Marland Kitchen omeprazole (PRILOSEC) 20 MG capsule   Oral   Take 20 mg by mouth 2 (two) times  daily before a meal.          BP 108/58  Pulse 70  Temp(Src) 98 F (36.7 C) (Oral)  Resp 16  Ht _0  (1.702 m)  Wt 182 lb (82.555 kg)  BMI 28.50 kg/m2  SpO2 97% Physical Exam  Constitutional: He is oriented to person, place, and time. He appears well-developed and well-nourished. No distress.  HENT:  Head: Normocephalic and atraumatic.  Nose: Nose normal.  Mouth/Throat: Oropharynx is clear and moist.  TTP of L TMJ and temporal scalp/forehead  Eyes: Conjunctivae and EOM are normal. Pupils are equal, round, and reactive to light.  Neck: Normal range of motion. Neck supple. No tracheal  deviation present.  Cardiovascular: Normal rate, regular rhythm, normal heart sounds and intact distal pulses.   Pulmonary/Chest: Effort normal and breath sounds normal. No respiratory distress. He has no rales.  Abdominal: Soft. Bowel sounds are normal. He exhibits no distension. There is no tenderness. There is no rebound and no guarding.  Musculoskeletal: Normal range of motion. He exhibits no edema and no tenderness.  Neurological: He is alert and oriented to person, place, and time. He has normal strength. He displays no tremor and normal reflexes. No cranial nerve deficit or sensory deficit. He exhibits normal muscle tone. He displays a negative Romberg sign. Coordination and gait normal. GCS eye subscore is 4. GCS verbal subscore is 5. GCS motor subscore is 6.  Normal HINTS, finger to nose, gait, heel to toe walk.   Skin: Skin is warm and dry.  Psychiatric: He has a normal mood and affect. His behavior is normal.    ED Course  Procedures (including critical care time) Labs Review Labs Reviewed  SEDIMENTATION RATE - Abnormal; Notable for the following:    Sed Rate 31 (*)    All other components within normal limits  BASIC METABOLIC PANEL - Abnormal; Notable for the following:    Creatinine, Ser 1.40 (*)    GFR calc non Af Amer 52 (*)    GFR calc Af Amer 60 (*)    All other components within normal limits  CBC WITH DIFFERENTIAL  C-REACTIVE PROTEIN     MDM   Final diagnoses:  Temporomandibular joint (TMJ) pain  Left-sided face pain    63 yo M in NAD AFVSS non toxic appearing presents with 3 days of pain over his L TMJ. Pain worsens with palpation of L TMJ. Neuro exam non focal. No emergent need for neuro imaging. Mild sinus ttp but no nasal drainage doubt sinusitis. Mild ttp over temporal area with a mild elevation in ESR. Considered temporal arteritis. No blurry vision. Low suspicion for TA. Will follow up with PCP for further work up as needed. Will treat for TMJ syndrome at  this time. Pain improved in ED with  motrin/norco.   Thorough discussion with patient on plan, findings, return precautions.   Case discussed with my attending Dr. Vanita Panda.   Ruthell Rummage, MD 10/28/13 [phone]

## 2014-02-03 ENCOUNTER — Other Ambulatory Visit (HOSPITAL_COMMUNITY): Payer: Self-pay | Admitting: Internal Medicine

## 2014-02-03 DIAGNOSIS — I739 Peripheral vascular disease, unspecified: Secondary | ICD-10-CM

## 2014-02-03 DIAGNOSIS — M79605 Pain in left leg: Secondary | ICD-10-CM

## 2014-02-03 DIAGNOSIS — M79604 Pain in right leg: Secondary | ICD-10-CM

## 2014-02-08 ENCOUNTER — Ambulatory Visit (HOSPITAL_COMMUNITY)
Admission: RE | Admit: 2014-02-08 | Discharge: 2014-02-08 | Disposition: A | Discharge status: Home / Self Care | Payer: Medicaid Other | Source: Ambulatory Visit | Attending: Internal Medicine | Admitting: Internal Medicine

## 2014-02-08 DIAGNOSIS — I739 Peripheral vascular disease, unspecified: Secondary | ICD-10-CM | POA: Insufficient documentation

## 2014-02-08 DIAGNOSIS — M79604 Pain in right leg: Secondary | ICD-10-CM

## 2014-02-08 DIAGNOSIS — M79609 Pain in unspecified limb: Secondary | ICD-10-CM | POA: Insufficient documentation

## 2014-02-08 DIAGNOSIS — M79605 Pain in left leg: Secondary | ICD-10-CM

## 2014-05-17 ENCOUNTER — Encounter: Payer: Self-pay | Admitting: Podiatry

## 2014-05-17 ENCOUNTER — Ambulatory Visit (INDEPENDENT_AMBULATORY_CARE_PROVIDER_SITE_OTHER): Payer: Medicaid Other | Admitting: Podiatry

## 2014-05-17 ENCOUNTER — Ambulatory Visit (INDEPENDENT_AMBULATORY_CARE_PROVIDER_SITE_OTHER): Payer: Medicaid Other

## 2014-05-17 VITALS — BP 145/88 | HR 77 | Resp 16

## 2014-05-17 DIAGNOSIS — M205X9 Other deformities of toe(s) (acquired), unspecified foot: Secondary | ICD-10-CM

## 2014-05-17 DIAGNOSIS — M19079 Primary osteoarthritis, unspecified ankle and foot: Secondary | ICD-10-CM

## 2014-05-17 DIAGNOSIS — M7660 Achilles tendinitis, unspecified leg: Secondary | ICD-10-CM

## 2014-05-17 DIAGNOSIS — M779 Enthesopathy, unspecified: Secondary | ICD-10-CM

## 2014-05-17 DIAGNOSIS — M2010 Hallux valgus (acquired), unspecified foot: Secondary | ICD-10-CM

## 2014-05-17 DIAGNOSIS — M778 Other enthesopathies, not elsewhere classified: Secondary | ICD-10-CM

## 2014-05-17 NOTE — Progress Notes (Signed)
He presents today for a chief complaint of a painful first metatarsophalangeal joint bilateral. He denies any trauma states this been going on for several months.  Objective: Vital signs are stable he is alert and oriented x3. His no erythema edema saline is drainage or odor. His pain on palpation and range of motion of the first metatarsophalangeal joint. He also has a history of gout.  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  Plan: Injected dexamethasone today and local anesthetic to the point of maximal tenderness after sterile Betadine skin prep will followup with him in one month.

## 2014-06-14 ENCOUNTER — Telehealth: Payer: Self-pay | Admitting: *Deleted

## 2014-06-14 NOTE — Telephone Encounter (Signed)
I was there 3 weeks ago, got shots in both big toes.  I have the same problem.  Could you give me a call?  Thank you.  I returned his call, asked how I could help him.  He stated,  "I was there about 3 weeks ago and the doctor put shots in my toes.  They still doing the same thing, the shots hardly helped.  What should I do?"  I told him he needs to schedule an appointment, Dr. Al CorpusHyatt wanted you back in a month.  I asked him to call back tomorrow to schedule an appointment with Dr. Al CorpusHyatt because all the schedulers are gone.  He stated, "Alright, thanks for calling."

## 2014-06-16 ENCOUNTER — Encounter: Payer: Self-pay | Admitting: Podiatry

## 2014-06-16 ENCOUNTER — Ambulatory Visit (INDEPENDENT_AMBULATORY_CARE_PROVIDER_SITE_OTHER): Payer: Medicaid Other | Admitting: Podiatry

## 2014-06-16 VITALS — BP 160/96 | HR 76 | Resp 16

## 2014-06-16 DIAGNOSIS — M205X9 Other deformities of toe(s) (acquired), unspecified foot: Secondary | ICD-10-CM

## 2014-06-16 DIAGNOSIS — M7750 Other enthesopathy of unspecified foot: Secondary | ICD-10-CM | POA: Diagnosis not present

## 2014-06-16 DIAGNOSIS — M779 Enthesopathy, unspecified: Principal | ICD-10-CM

## 2014-06-16 DIAGNOSIS — M778 Other enthesopathies, not elsewhere classified: Secondary | ICD-10-CM

## 2014-06-16 MED ORDER — TRIAMCINOLONE ACETONIDE 10 MG/ML IJ SUSP
10.0000 mg | Freq: Once | INTRAMUSCULAR | Status: AC
  Administered 2014-06-16: 10 mg

## 2014-06-16 NOTE — Progress Notes (Signed)
Subjective:     Patient ID: Marcus [name], male   DOB: 11/27/1950, 63 y.o.   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

## 2014-07-06 ENCOUNTER — Inpatient Hospital Stay (HOSPITAL_COMMUNITY): Payer: Medicaid Other

## 2014-07-06 ENCOUNTER — Other Ambulatory Visit: Payer: Self-pay

## 2014-07-06 ENCOUNTER — Encounter (HOSPITAL_COMMUNITY): Payer: Self-pay | Admitting: *Deleted

## 2014-07-06 ENCOUNTER — Inpatient Hospital Stay (HOSPITAL_COMMUNITY)
Admission: AD | Admit: 2014-07-06 | Discharge: 2014-07-07 | DRG: 313 | Disposition: A | Discharge status: Home / Self Care | Payer: Medicaid Other | Source: Ambulatory Visit | Attending: Cardiovascular Disease | Admitting: Cardiovascular Disease

## 2014-07-06 DIAGNOSIS — E785 Hyperlipidemia, unspecified: Secondary | ICD-10-CM | POA: Diagnosis present

## 2014-07-06 DIAGNOSIS — Z79899 Other long term (current) drug therapy: Secondary | ICD-10-CM | POA: Diagnosis not present

## 2014-07-06 DIAGNOSIS — Z87891 Personal history of nicotine dependence: Secondary | ICD-10-CM

## 2014-07-06 DIAGNOSIS — N182 Chronic kidney disease, stage 2 (mild): Secondary | ICD-10-CM | POA: Diagnosis present

## 2014-07-06 DIAGNOSIS — I2510 Atherosclerotic heart disease of native coronary artery without angina pectoris: Secondary | ICD-10-CM | POA: Diagnosis present

## 2014-07-06 DIAGNOSIS — I509 Heart failure, unspecified: Secondary | ICD-10-CM | POA: Diagnosis present

## 2014-07-06 DIAGNOSIS — K219 Gastro-esophageal reflux disease without esophagitis: Secondary | ICD-10-CM | POA: Diagnosis present

## 2014-07-06 DIAGNOSIS — R079 Chest pain, unspecified: Secondary | ICD-10-CM | POA: Diagnosis present

## 2014-07-06 DIAGNOSIS — Z951 Presence of aortocoronary bypass graft: Secondary | ICD-10-CM | POA: Diagnosis not present

## 2014-07-06 DIAGNOSIS — M1990 Unspecified osteoarthritis, unspecified site: Secondary | ICD-10-CM | POA: Diagnosis present

## 2014-07-06 DIAGNOSIS — Z7982 Long term (current) use of aspirin: Secondary | ICD-10-CM

## 2014-07-06 DIAGNOSIS — F329 Major depressive disorder, single episode, unspecified: Secondary | ICD-10-CM | POA: Diagnosis present

## 2014-07-06 DIAGNOSIS — Z955 Presence of coronary angioplasty implant and graft: Secondary | ICD-10-CM

## 2014-07-06 DIAGNOSIS — J449 Chronic obstructive pulmonary disease, unspecified: Secondary | ICD-10-CM | POA: Diagnosis present

## 2014-07-06 DIAGNOSIS — F419 Anxiety disorder, unspecified: Secondary | ICD-10-CM | POA: Diagnosis present

## 2014-07-06 DIAGNOSIS — I129 Hypertensive chronic kidney disease with stage 1 through stage 4 chronic kidney disease, or unspecified chronic kidney disease: Secondary | ICD-10-CM | POA: Diagnosis present

## 2014-07-06 LAB — CBC
HEMATOCRIT: 42 % (ref 39.0–52.0)
HEMOGLOBIN: 13.5 g/dL (ref 13.0–17.0)
MCH: 27.5 pg (ref 26.0–34.0)
MCHC: 32.1 g/dL (ref 30.0–36.0)
MCV: 85.5 fL (ref 78.0–100.0)
Platelets: 171 10*3/uL (ref 150–400)
RBC: 4.91 MIL/uL (ref 4.22–5.81)
RDW: 14.2 % (ref 11.5–15.5)
WBC: 6.7 10*3/uL (ref 4.0–10.5)

## 2014-07-06 LAB — COMPREHENSIVE METABOLIC PANEL
ALK PHOS: 112 U/L (ref 39–117)
ALT: 17 U/L (ref 0–53)
ANION GAP: 12 (ref 5–15)
AST: 19 U/L (ref 0–37)
Albumin: 3.5 g/dL (ref 3.5–5.2)
BUN: 14 mg/dL (ref 6–23)
CO2: 25 meq/L (ref 19–32)
Calcium: 9.3 mg/dL (ref 8.4–10.5)
Chloride: 102 mEq/L (ref 96–112)
Creatinine, Ser: 1.45 mg/dL — ABNORMAL HIGH (ref 0.50–1.35)
GFR calc Af Amer: 58 mL/min — ABNORMAL LOW (ref >90)
GFR, EST NON AFRICAN AMERICAN: 50 mL/min — AB (ref >90)
Glucose, Bld: 99 mg/dL (ref 70–99)
POTASSIUM: 4.3 meq/L (ref 3.7–5.3)
SODIUM: 139 meq/L (ref 137–147)
Total Protein: 6.7 g/dL (ref 6.0–8.3)

## 2014-07-06 LAB — PROTIME-INR
INR: 1 (ref 0.00–1.49)
Prothrombin Time: 13.3 seconds (ref 11.6–15.2)

## 2014-07-06 LAB — HEPARIN LEVEL (UNFRACTIONATED): Heparin Unfractionated: 1.22 IU/mL — ABNORMAL HIGH (ref 0.30–0.70)

## 2014-07-06 LAB — TROPONIN I: Troponin I: <0.3 ng/mL (ref <0.30)

## 2014-07-06 MED ORDER — METOPROLOL TARTRATE 12.5 MG HALF TABLET
12.5000 mg | ORAL_TABLET | Freq: Two times a day (BID) | ORAL | Status: DC
  Administered 2014-07-06 – 2014-07-07 (×4): 12.5 mg via ORAL
  Filled 2014-07-06 (×4): qty 1

## 2014-07-06 MED ORDER — HEPARIN (PORCINE) IN NACL 100-0.45 UNIT/ML-% IJ SOLN
1100.0000 [IU]/h | INTRAMUSCULAR | Status: DC
  Administered 2014-07-06: 1100 [IU]/h via INTRAVENOUS
  Filled 2014-07-06: qty 250

## 2014-07-06 MED ORDER — ASPIRIN 300 MG RE SUPP: 300.0000 mg | RECTAL | Status: AC

## 2014-07-06 MED ORDER — PANTOPRAZOLE SODIUM 40 MG PO TBEC
40.0000 mg | DELAYED_RELEASE_TABLET | Freq: Every day | ORAL | Status: DC
  Administered 2014-07-06 – 2014-07-07 (×2): 40 mg via ORAL
  Filled 2014-07-06 (×2): qty 1

## 2014-07-06 MED ORDER — ISOSORBIDE MONONITRATE ER 30 MG PO TB24
30.0000 mg | ORAL_TABLET | Freq: Every day | ORAL | Status: DC
  Administered 2014-07-06 – 2014-07-07 (×2): 30 mg via ORAL
  Filled 2014-07-06 (×2): qty 1

## 2014-07-06 MED ORDER — DIPHENHYDRAMINE HCL 25 MG PO CAPS
25.0000 mg | ORAL_CAPSULE | Freq: Four times a day (QID) | ORAL | Status: DC | PRN
  Administered 2014-07-06 – 2014-07-07 (×2): 25 mg via ORAL
  Filled 2014-07-06 (×2): qty 1

## 2014-07-06 MED ORDER — NITROGLYCERIN 0.4 MG SL SUBL: 0.4000 mg | SUBLINGUAL_TABLET | SUBLINGUAL | Status: DC | PRN

## 2014-07-06 MED ORDER — HYDROCODONE-ACETAMINOPHEN 5-325 MG PO TABS
1.0000 | ORAL_TABLET | Freq: Four times a day (QID) | ORAL | Status: DC | PRN
  Administered 2014-07-06 – 2014-07-07 (×3): 1 via ORAL
  Filled 2014-07-06 (×3): qty 1

## 2014-07-06 MED ORDER — SODIUM CHLORIDE 0.9 % IV SOLN
INTRAVENOUS | Status: DC
  Administered 2014-07-06: 17:00:00 via INTRAVENOUS

## 2014-07-06 MED ORDER — ONDANSETRON HCL 4 MG/2ML IJ SOLN: 4.0000 mg | Freq: Four times a day (QID) | INTRAMUSCULAR | Status: DC | PRN

## 2014-07-06 MED ORDER — HEPARIN (PORCINE) IN NACL 100-0.45 UNIT/ML-% IJ SOLN: 950.0000 [IU]/h | INTRAMUSCULAR | Status: DC

## 2014-07-06 MED ORDER — ASPIRIN 81 MG PO CHEW
324.0000 mg | CHEWABLE_TABLET | ORAL | Status: AC
  Administered 2014-07-06: 324 mg via ORAL
  Filled 2014-07-06: qty 4

## 2014-07-06 MED ORDER — ASPIRIN EC 81 MG PO TBEC
81.0000 mg | DELAYED_RELEASE_TABLET | Freq: Every day | ORAL | Status: DC
  Administered 2014-07-07: 81 mg via ORAL
  Filled 2014-07-06: qty 1

## 2014-07-06 MED ORDER — SODIUM CHLORIDE 0.9 % IV BOLUS (SEPSIS)
150.0000 mL | Freq: Once | INTRAVENOUS | Status: AC
  Administered 2014-07-06: 150 mL via INTRAVENOUS

## 2014-07-06 MED ORDER — ACETAMINOPHEN 325 MG PO TABS: 650.0000 mg | ORAL_TABLET | ORAL | Status: DC | PRN

## 2014-07-06 MED ORDER — ATORVASTATIN CALCIUM 20 MG PO TABS
20.0000 mg | ORAL_TABLET | Freq: Every day | ORAL | Status: DC
  Administered 2014-07-06 – 2014-07-07 (×2): 20 mg via ORAL
  Filled 2014-07-06 (×2): qty 1

## 2014-07-06 MED ORDER — HEPARIN BOLUS VIA INFUSION
4000.0000 [IU] | Freq: Once | INTRAVENOUS | Status: AC
  Administered 2014-07-06: 4000 [IU] via INTRAVENOUS
  Filled 2014-07-06: qty 4000

## 2014-07-06 NOTE — Progress Notes (Signed)
Pt complaining of a headache. Took BP 118/60 from 145/78 on admission. Gave pt medications earlier. Pt reports not taking his medications consistently on a regular basis. Dr. Algie CofferKadakia notified. Small bolus given to patient. Will continue to monitor.

## 2014-07-06 NOTE — Progress Notes (Signed)
ANTICOAGULATION CONSULT NOTE - Follow Up Consult  Pharmacy Consult for heparin Indication: chest pain/ACS  Labs:  Recent Labs  07/06/14 1641 07/06/14 1715 07/06/14 2241  HGB  --  13.5  --   HCT  --  42.0  --   PLT  --  171  --   LABPROT 13.3  --   --   INR 1.00  --   --   HEPARINUNFRC  --   --  1.22*  CREATININE 1.45*  --   --   TROPONINI <0.30  --  <0.30    Assessment: 63yo male supratherapeutic on heparin with initial dosing for CP; lab draw correctly though bolus given late so suspect this may still be affecting level.  Goal of Therapy:  Heparin level 0.3-0.7 units/ml   Plan:  Will hold heparin gtt x8145min then resume at 2 units/kg/hr lower rate of 950 units/hr and check level in 8hr.  Vernard GamblesVeronda Wilton Thrall, PharmD, BCPS  07/06/2014,11:51 PM

## 2014-07-06 NOTE — H&P (Signed)
Referring Physician:  Raliegh ScarletJames J [name] is an 63 y.o. male.                       Chief Complaint: 63 year old male with one day history of chest pain and palpitations.   HPI: 63 year old male with heart palpitations and chest pain without nausea and shortness of breath. No fever.   Past Medical History  Diagnosis Date  . Arthritis   . CHF (congestive heart failure)   . Depression   . GERD (gastroesophageal reflux disease)   . Hyperlipidemia   . Hypertension   . Cervical spine disease       Past Surgical History  Procedure Laterality Date  . Triple bypass    . Breast lump removal      Family History  Problem Relation Age of Onset  . Kidney disease Mother   . Asthma Mother   . Hypertension Father    Social History:  reports that he has quit smoking. He does not have any smokeless tobacco history on file. He reports that he drinks alcohol. He reports that he uses illicit drugs (Marijuana).  Allergies:  Allergies  Allergen Reactions  . Penicillins Hives    Medications Prior to Admission  Medication Sig Dispense Refill  . aspirin 81 MG tablet Take 81 mg by mouth daily.      Marland Kitchen. HYDROcodone-acetaminophen (NORCO/VICODIN) 5-325 MG per tablet Take 1 tablet by mouth every 6 (six) hours as needed for moderate pain.  5 tablet  0  . ibuprofen (ADVIL,MOTRIN) 600 MG tablet Take 1 tablet (600 mg total) by mouth 3 (three) times daily with meals.  15 tablet  0  . isosorbide mononitrate (IMDUR) 30 MG 24 hr tablet Take 30 mg by mouth daily.      . metoprolol tartrate (LOPRESSOR) 12.5 mg TABS Take 12.5 mg by mouth 2 (two) times daily.      Marland Kitchen. omeprazole (PRILOSEC) 20 MG capsule Take 20 mg by mouth 2 (two) times daily before a meal.        No results found for this or any previous visit (from the past 48 hour([name])). No results found.  Review Of Systems Constitutional: Positive for diaphoresis. Negative for fever.  Respiratory: Negative for shortness of breath.  Cardiovascular: Positive for  chest pain. Negative for syncope.  Gastrointestinal: Positive for heartburn and abdominal pain. Negative for vomiting and blood in stool.  Musculoskeletal: Negative for back pain.  Neurological: Positive for dizziness. Negative for weakness.  All other systems reviewed and are negative.  Blood pressure 145/78, pulse 80, temperature 98.3 F (36.8 C), temperature source Oral, resp. rate 16, height 5\' 7"  (1.702 m), weight 87.091 kg (192 lb), SpO2 98.00%. Physical Exam :  CONSTITUTIONAL: Well developed/well nourished  HEAD: Normocephalic/atraumatic  EYES: Brown eyes, conj-pink, sclera-white. Wears glasses. EOMI/PERRL  ENMT: Mucous membranes moist  NECK: supple.  CV: S1/S2 noted. No rubs/gallops noted II/VI systolic murmur.  LUNGS: Lungs are clear to auscultation bilaterally, no apparent distress  ABDOMEN: soft, nontender, no rebound or guarding  NEURO: Pt is awake/alert, moves all extremitiesx4  EXTREMITIES: pulses normal, full ROM  SKIN: warm, color normal  PSYCH: no abnormalities of mood noted  Assessment/Plan Chest pain  CAD  Stent in LCX  CABG  Hypertension  COPD  Anxiety  Place in observation, r/o MI/Nuclear stress test in AM  [facility]AD[facility],Marcus Karch S, MD  07/06/2014, 3:04 PM

## 2014-07-06 NOTE — Progress Notes (Signed)
Pt c/o headache pain 10/10. BP 118/71, 12.5mg  metoprolol just given. Pt wants to take ordered PRN norco/vicodin but states it "makes me itch". MD made aware and asked for benadryl, awaiting response.

## 2014-07-06 NOTE — Progress Notes (Signed)
ANTICOAGULATION CONSULT NOTE - Initial Consult  Pharmacy Consult for heparin Indication: chest pain/ACS  Allergies  Allergen Reactions  . Penicillins Hives    Patient Measurements: Height: 5\' 7"  (170.2 cm) Weight: 192 lb (87.091 kg) IBW/kg (Calculated) : 66.1 Heparin Dosing Weight: 84kg  Vital Signs: Temp: 98.3 F (36.8 C) (10/28 1452) Temp Source: Oral (10/28 1452) BP: 145/78 mmHg (10/28 1452) Pulse Rate: 80 (10/28 1452)  Labs: No results found for this basename: HGB, HCT, PLT, APTT, LABPROT, INR, HEPARINUNFRC, CREATININE, CKTOTAL, CKMB, TROPONINI,  in the last 72 hours  Estimated Creatinine Clearance: 56.9 ml/min (by C-G formula based on Cr of 1.4).   Medical History: Past Medical History  Diagnosis Date  . Arthritis   . CHF (congestive heart failure)   . Depression   . GERD (gastroesophageal reflux disease)   . Hyperlipidemia   . Hypertension   . Cervical spine disease     Assessment: Marcus [name] with chest pain and palpitations x1 day. Planning for nuclear stress test in the morning, to start heparin now.  No anticoagulation PTA. Baseline labs ordered but not yet drawn.  Goal of Therapy:  Heparin level 0.3-0.7 units/ml Monitor platelets by anticoagulation protocol: Yes   Plan:  1. Heparin bolus 4000 units IV x1, then start 1100 units/hr 2. Heparin level in 6 hours 3. Daily heparin level and CBC 4. Will follow up intake information and adjust plan if needed  Rayland Hamed D. Adlene Adduci, PharmD, BCPS Clinical Pharmacist Pager: [phone] 07/06/2014 3:19 PM

## 2014-07-07 ENCOUNTER — Encounter (HOSPITAL_COMMUNITY): Payer: Medicaid Other

## 2014-07-07 ENCOUNTER — Inpatient Hospital Stay (HOSPITAL_COMMUNITY): Payer: Medicaid Other

## 2014-07-07 LAB — CBC
HEMATOCRIT: 40.8 % (ref 39.0–52.0)
HEMOGLOBIN: 13.2 g/dL (ref 13.0–17.0)
MCH: 27.2 pg (ref 26.0–34.0)
MCHC: 32.4 g/dL (ref 30.0–36.0)
MCV: 84.1 fL (ref 78.0–100.0)
Platelets: 156 10*3/uL (ref 150–400)
RBC: 4.85 MIL/uL (ref 4.22–5.81)
RDW: 14.3 % (ref 11.5–15.5)
WBC: 6.7 10*3/uL (ref 4.0–10.5)

## 2014-07-07 LAB — LIPID PANEL
CHOL/HDL RATIO: 3.5 ratio
CHOLESTEROL: 184 mg/dL (ref 0–200)
HDL: 52 mg/dL (ref >39)
LDL Cholesterol: 102 mg/dL — ABNORMAL HIGH (ref 0–99)
TRIGLYCERIDES: 149 mg/dL (ref <150)
VLDL: 30 mg/dL (ref 0–40)

## 2014-07-07 LAB — BASIC METABOLIC PANEL
Anion gap: 13 (ref 5–15)
BUN: 15 mg/dL (ref 6–23)
CALCIUM: 8.6 mg/dL (ref 8.4–10.5)
CO2: 24 meq/L (ref 19–32)
Chloride: 105 mEq/L (ref 96–112)
Creatinine, Ser: 1.42 mg/dL — ABNORMAL HIGH (ref 0.50–1.35)
GFR calc Af Amer: 59 mL/min — ABNORMAL LOW (ref >90)
GFR calc non Af Amer: 51 mL/min — ABNORMAL LOW (ref >90)
GLUCOSE: 93 mg/dL (ref 70–99)
Potassium: 4.5 mEq/L (ref 3.7–5.3)
Sodium: 142 mEq/L (ref 137–147)

## 2014-07-07 LAB — HEPARIN LEVEL (UNFRACTIONATED): Heparin Unfractionated: 1.66 IU/mL — ABNORMAL HIGH (ref 0.30–0.70)

## 2014-07-07 LAB — PROTIME-INR
INR: 1.03 (ref 0.00–1.49)
PROTHROMBIN TIME: 13.6 s (ref 11.6–15.2)

## 2014-07-07 LAB — TROPONIN I: Troponin I: <0.3 ng/mL (ref <0.30)

## 2014-07-07 MED ORDER — REGADENOSON 0.4 MG/5ML IV SOLN
0.4000 mg | Freq: Once | INTRAVENOUS | Status: AC
  Administered 2014-07-07: 0.4 mg via INTRAVENOUS
  Filled 2014-07-07: qty 5

## 2014-07-07 MED ORDER — ATORVASTATIN CALCIUM 20 MG PO TABS: 20.0000 mg | ORAL_TABLET | Freq: Every day | ORAL | Status: DC

## 2014-07-07 MED ORDER — REGADENOSON 0.4 MG/5ML IV SOLN
INTRAVENOUS | Status: AC
  Filled 2014-07-07: qty 5

## 2014-07-07 MED ORDER — HEPARIN (PORCINE) IN NACL 100-0.45 UNIT/ML-% IJ SOLN: 700.0000 [IU]/h | INTRAMUSCULAR | Status: DC

## 2014-07-07 MED ORDER — TECHNETIUM TC 99M SESTAMIBI GENERIC - CARDIOLITE
30.0000 | Freq: Once | INTRAVENOUS | Status: AC | PRN
  Administered 2014-07-07: 30 via INTRAVENOUS

## 2014-07-07 MED ORDER — TECHNETIUM TC 99M SESTAMIBI - CARDIOLITE
10.0000 | Freq: Once | INTRAVENOUS | Status: AC | PRN
  Administered 2014-07-07: 10 via INTRAVENOUS

## 2014-07-07 NOTE — Care Management Note (Unsigned)
    Page 1 of 1   07/07/2014     10:44:34 AM CARE MANAGEMENT NOTE 07/07/2014  Patient:  Marcus [name],Marcus [name]   Account Number:  [id number]  Date Initiated:  07/07/2014  Documentation initiated by:  GRAVES-BIGELOW,Kinnley Paulson  Subjective/Objective Assessment:   Pt admitted for CP. Plan for stress test 07-07-14.     Action/Plan:   No needs from CM at this time.   Anticipated DC Date:  07/08/2014   Anticipated DC Plan:  HOME/SELF CARE      DC Planning Services  CM consult      Choice offered to / List presented to:             Status of service:  In process, will continue to follow Medicare Important Message given?  NO (If response is "NO", the following Medicare IM given date fields will be blank) Date Medicare IM given:   Medicare IM given by:   Date Additional Medicare IM given:   Additional Medicare IM given by:    Discharge Disposition:    Per UR Regulation:  Reviewed for med. necessity/level of care/duration of stay  If discussed at Long Length of Stay Meetings, dates discussed:    Comments:

## 2014-07-07 NOTE — Progress Notes (Signed)
UR Completed Darryel Diodato Graves-Bigelow, RN,BSN [phone]  

## 2014-07-07 NOTE — Discharge Summary (Signed)
Physician Discharge Summary  Patient ID: Marcus ScarletJames J Hardgrave MRN: [id number] DOB/AGE: 64/12/1950 64 y.o.  Admit date: 07/06/2014 Discharge date: 07/07/2014  Admission Diagnoses: Chest pain  CAD  Stent in LCX  CABG  Hypertension  COPD  Anxiety  Discharge Diagnoses:  Principle Problem: * Chest pain at rest * CAD  Stent in LCX  CABG  Hypertension  COPD  Anxiety Chronic kidney disease, II  Discharged Condition: fair  Hospital Course: 63 year old male had heart palpitations and chest pain without nausea, fever  and shortness of breath.  His cardiac enzymes were normal and his nuclear stress test was without reversible ischemia. His echocardiogram showed inferior wall hypokinesia. LV ejection fraction was 45-50 %. Add Ace-inhibitor if renal function improves or remains stable.  Consults: cardiology  Significant Diagnostic Studies: labs: Normal CBC and BMET except mildly elevated creatinine.  EKG-NSR, Inferior ischemia and LVH.  Nuclear stress test- No reversible ischemia. EF 47 %.  CXR-No active cardiopulmonary abnormalities.  Treatments: cardiac meds: Aspirin, Atorvastatin and metoprolol.  Discharge Exam: Blood pressure 117/72, pulse 65, temperature 98 F (36.7 C), temperature source Oral, resp. rate 19, height 5\' 7"  (1.702 m), weight 84.868 kg (187 lb 1.6 oz), SpO2 99.00%. Physical Exam :  CONSTITUTIONAL: Well developed/well nourished  HEAD: Normocephalic/atraumatic  EYES: Brown eyes, conj-pink, sclera-white. Wears glasses. EOMI/PERRL  ENMT: Mucous membranes moist  NECK: supple.  CV: S1/S2 noted. No rubs/gallops noted II/VI systolic murmur.  LUNGS: Lungs are clear to auscultation bilaterally, no apparent distress  ABDOMEN: soft, nontender, no rebound or guarding  NEURO: Pt is awake/alert, moves all extremitiesx4  EXTREMITIES: pulses normal, full ROM  SKIN: warm, color normal  PSYCH: no abnormalities of mood noted   Disposition: 01-Home or Self Care      Medication List         ALPRAZolam 1 MG tablet  Commonly known as:  XANAX  Take 1 mg by mouth 2 (two) times daily as needed for anxiety.     aspirin 81 MG tablet  Take 81 mg by mouth daily.     atorvastatin 20 MG tablet  Commonly known as:  LIPITOR  Take 1 tablet (20 mg total) by mouth daily at 6 PM.     metoprolol tartrate 12.5 mg Tabs tablet  Commonly known as:  LOPRESSOR  Take 12.5 mg by mouth 2 (two) times daily.     omeprazole 20 MG capsule  Commonly known as:  PRILOSEC  Take 20 mg by mouth 2 (two) times daily before a meal.     oxyCODONE-acetaminophen 5-325 MG per tablet  Commonly known as:  PERCOCET/ROXICET  Take 1 tablet by mouth every 8 (eight) hours as needed for severe pain (pain).           Follow-up Information   Follow up with AVBUERE,EDWIN A, MD. Schedule an appointment as soon as possible for a visit in 1 month.   Specialty:  Internal Medicine   Contact information:   [address] [place] [address] [phone]       [facility] with [facility]Adisynn Suleiman S, MD. Schedule an appointment as soon as possible for a visit in 1 week.   Specialty:  Cardiology   Contact information:   [address] [place] [address] [phone]       Signed: Ricki RodriguezKADAKIA,Jondavid Schreier S 07/07/2014, 6:32 PM

## 2014-07-07 NOTE — Progress Notes (Signed)
ANTICOAGULATION CONSULT NOTE - Follow Up Consult  Pharmacy Consult for Heparin Indication: chest pain/ACS  Allergies  Allergen Reactions  . Penicillins Hives    Patient Measurements: Height: 5\' 7"  (170.2 cm) Weight: 187 lb 1.6 oz (84.868 kg) IBW/kg (Calculated) : 66.1 Heparin Dosing Weight:   Vital Signs: Temp: 98 F (36.7 C) (10/29 1344) Temp Source: Oral (10/29 1344) BP: 117/72 mmHg (10/29 1344) Pulse Rate: 65 (10/29 1344)  Labs:  Recent Labs  07/06/14 1641 07/06/14 1715 07/06/14 2241 07/07/14 0259 07/07/14 0315 07/07/14 1210  HGB  --  13.5  --   --  13.2  --   HCT  --  42.0  --   --  40.8  --   PLT  --  171  --   --  156  --   LABPROT 13.3  --   --   --  13.6  --   INR 1.00  --   --   --  1.03  --   HEPARINUNFRC  --   --  1.22*  --   --  1.66*  CREATININE 1.45*  --   --   --  1.42*  --   TROPONINI <0.30  --  <0.30 <0.30  --   --     Estimated Creatinine Clearance: 55.4 ml/min (by C-G formula based on Cr of 1.42).   Medications:  Scheduled:  . aspirin EC  81 mg Oral Daily  . atorvastatin  20 mg Oral q1800  . isosorbide mononitrate  30 mg Oral Daily  . metoprolol tartrate  12.5 mg Oral BID  . pantoprazole  40 mg Oral Daily  . regadenoson        Assessment: 63yo male with ACS, heparin level drawn late as pt was at stress test this AM.  HL increased further despite hold/reduced rate overnight.  Lab was drawn appropriately from opposite arm and IV pump is set at correct rate.  No bleeding problems noted per d/w patient.    Goal of Therapy:  Heparin level 0.3-0.7 units/ml Monitor platelets by anticoagulation protocol: Yes   Plan:  1-  Hold heparin x 1hr, then resume at 700 units/hr 2-  Heparin level 8hr after resume  Marisue HumbleKendra Pranay Hilbun, PharmD Clinical Pharmacist [facility]

## 2014-07-07 NOTE — Progress Notes (Signed)
Echo Lab  2D Echocardiogram completed.  Marcus [name], RDCS 07/07/2014 10:29 AM

## 2014-08-18 ENCOUNTER — Encounter (HOSPITAL_COMMUNITY): Payer: Self-pay | Admitting: Cardiovascular Disease

## 2014-12-30 ENCOUNTER — Ambulatory Visit: Payer: Medicaid Other | Admitting: Podiatrist

## 2015-01-18 ENCOUNTER — Ambulatory Visit: Payer: Medicaid Other | Admitting: Podiatrist

## 2015-01-20 ENCOUNTER — Ambulatory Visit (INDEPENDENT_AMBULATORY_CARE_PROVIDER_SITE_OTHER): Payer: Medicaid Other

## 2015-01-20 ENCOUNTER — Encounter: Payer: Self-pay | Admitting: Podiatry

## 2015-01-20 ENCOUNTER — Ambulatory Visit (INDEPENDENT_AMBULATORY_CARE_PROVIDER_SITE_OTHER): Payer: Medicaid Other | Admitting: Podiatry

## 2015-01-20 VITALS — BP 145/94 | HR 81 | Resp 15

## 2015-01-20 DIAGNOSIS — M79672 Pain in left foot: Secondary | ICD-10-CM

## 2015-01-20 DIAGNOSIS — M79671 Pain in right foot: Secondary | ICD-10-CM

## 2015-01-20 DIAGNOSIS — M722 Plantar fascial fibromatosis: Secondary | ICD-10-CM | POA: Diagnosis not present

## 2015-01-20 MED ORDER — TRIAMCINOLONE ACETONIDE 10 MG/ML IJ SUSP
10.0000 mg | Freq: Once | INTRAMUSCULAR | Status: AC
  Administered 2015-01-20: 10 mg

## 2015-01-20 NOTE — Progress Notes (Signed)
   Subjective:    Patient ID: Marcus ScarletJames J Register, male    DOB: 05/07/1951, 64 y.o.   MRN: [id number]  HPI Pt presents with ongoing bilateral foot pain that radiates up calf, states that right foot is worse than his left and the pain worsens with prolonged standing   Review of Systems  All other systems reviewed and are negative.      Objective:   Physical Exam        Assessment & Plan:

## 2015-01-22 NOTE — Progress Notes (Signed)
Subjective:     Patient ID: Marcus [name], male   DOB: 10/06/1950, 64 y.o.   MRN: [id number]  HPI patient presents with pain in his heels right over left that at times radiates into his leg due to the discomfort. He states he's not had any swelling   Review of Systems     Objective:   Physical Exam Neurovascular status intact with negative Homans sign noted and discomfort in the plantar heel region bilateral with inflammation and fluid buildup noted    Assessment:     Plantar fasciitis bilateral acute nature    Plan:     Injected the plantar fascia bilateral 3 mg Kenalog 5 mill grams Xylocaine and advised on reduced activity. Reappoint to recheck

## 2015-02-19 ENCOUNTER — Emergency Department (HOSPITAL_COMMUNITY)
Admission: EM | Admit: 2015-02-19 | Discharge: 2015-02-19 | Disposition: A | Discharge status: Home / Self Care | Payer: Medicaid Other | Attending: Emergency Medicine | Admitting: Emergency Medicine

## 2015-02-19 ENCOUNTER — Encounter (HOSPITAL_COMMUNITY): Payer: Self-pay | Admitting: *Deleted

## 2015-02-19 DIAGNOSIS — Z9889 Other specified postprocedural states: Secondary | ICD-10-CM | POA: Insufficient documentation

## 2015-02-19 DIAGNOSIS — Z880 Allergy status to penicillin: Secondary | ICD-10-CM | POA: Diagnosis not present

## 2015-02-19 DIAGNOSIS — K219 Gastro-esophageal reflux disease without esophagitis: Secondary | ICD-10-CM | POA: Insufficient documentation

## 2015-02-19 DIAGNOSIS — I509 Heart failure, unspecified: Secondary | ICD-10-CM | POA: Insufficient documentation

## 2015-02-19 DIAGNOSIS — I10 Essential (primary) hypertension: Secondary | ICD-10-CM | POA: Insufficient documentation

## 2015-02-19 DIAGNOSIS — F329 Major depressive disorder, single episode, unspecified: Secondary | ICD-10-CM | POA: Insufficient documentation

## 2015-02-19 DIAGNOSIS — M1990 Unspecified osteoarthritis, unspecified site: Secondary | ICD-10-CM | POA: Diagnosis not present

## 2015-02-19 DIAGNOSIS — Z7982 Long term (current) use of aspirin: Secondary | ICD-10-CM | POA: Insufficient documentation

## 2015-02-19 DIAGNOSIS — Z87891 Personal history of nicotine dependence: Secondary | ICD-10-CM | POA: Diagnosis not present

## 2015-02-19 DIAGNOSIS — E785 Hyperlipidemia, unspecified: Secondary | ICD-10-CM | POA: Insufficient documentation

## 2015-02-19 DIAGNOSIS — M109 Gout, unspecified: Secondary | ICD-10-CM

## 2015-02-19 DIAGNOSIS — Z79899 Other long term (current) drug therapy: Secondary | ICD-10-CM | POA: Diagnosis not present

## 2015-02-19 DIAGNOSIS — M10062 Idiopathic gout, left knee: Secondary | ICD-10-CM | POA: Diagnosis not present

## 2015-02-19 DIAGNOSIS — M25562 Pain in left knee: Secondary | ICD-10-CM | POA: Diagnosis present

## 2015-02-19 MED ORDER — OXYCODONE HCL 5 MG PO TABA: 5.0000 mg | ORAL_TABLET | Freq: Four times a day (QID) | ORAL | Status: DC | PRN

## 2015-02-19 MED ORDER — INDOMETHACIN 25 MG PO CAPS: 25.0000 mg | ORAL_CAPSULE | Freq: Three times a day (TID) | ORAL | Status: DC

## 2015-02-19 MED ORDER — ONDANSETRON HCL 4 MG PO TABS
4.0000 mg | ORAL_TABLET | Freq: Once | ORAL | Status: AC
  Administered 2015-02-19: 4 mg via ORAL
  Filled 2015-02-19: qty 1

## 2015-02-19 MED ORDER — MORPHINE SULFATE 4 MG/ML IJ SOLN
4.0000 mg | Freq: Once | INTRAMUSCULAR | Status: AC
  Administered 2015-02-19: 4 mg via INTRAMUSCULAR
  Filled 2015-02-19: qty 1

## 2015-02-19 MED ORDER — KETOROLAC TROMETHAMINE 60 MG/2ML IM SOLN: 60.0000 mg | Freq: Once | INTRAMUSCULAR | Status: DC

## 2015-02-19 MED ORDER — ONDANSETRON HCL 4 MG PO TABS: 4.0000 mg | ORAL_TABLET | Freq: Three times a day (TID) | ORAL | Status: DC | PRN

## 2015-02-19 MED ORDER — MORPHINE SULFATE 4 MG/ML IJ SOLN
4.0000 mg | Freq: Once | INTRAMUSCULAR | Status: DC
Start: 2015-02-19 — End: 2015-02-19

## 2015-02-19 MED ORDER — OXYCODONE-ACETAMINOPHEN 5-325 MG PO TABS: 1.0000 | ORAL_TABLET | ORAL | Status: DC | PRN

## 2015-02-19 NOTE — ED Provider Notes (Signed)
CSN: [id number]     Arrival date & time 02/19/15  4098 History   None    No chief complaint on file.    (Consider location/radiation/quality/duration/timing/severity/associated sxs/prior Treatment) HPI  Marcus [name] is a 64 y.o. male with past medical of CHF, hypertension, hyperlipidemia, and history of arthritis in his toes, with injections bilaterally 2 months ago by podiatrist, gout documented in chart, but denied by pt, presents with sudden onset left knee pain that began one week ago.  When he woke up his knee was red, hot and swollen and extremely painful, rated 10/10.  It is exquisitely painful to touch or to move, he has tried taking Tylenol but it has not helped his pain, which has been persistent.  He states he's been having sweats and chills but no documented fever.  He has had similar symptoms in other joints, that "jump" from on area to another.  He has been drinking beer.  Has not tried anything for the pain, cannot take NSAIDS due to poor kidney function.  He has been seen for several similar complaints, says he has colchicine, but does not take it, and he denies having a history of gout.  Past Medical History  Diagnosis Date  . Arthritis   . CHF (congestive heart failure)   . Depression   . GERD (gastroesophageal reflux disease)   . Hyperlipidemia   . Hypertension   . Cervical spine disease    Past Surgical History  Procedure Laterality Date  . Triple bypass    . Breast lump removal    . Left heart catheterization with coronary/graft angiogram Right 05/01/2012    Procedure: LEFT HEART CATHETERIZATION WITH Isabel Caprice;  Surgeon: Ricki Rodriguez, MD;  Location: [facility]ATH [facility];  Service: Cardiovascular;  Laterality: Right;   Family History  Problem Relation Age of Onset  . Kidney disease Mother   . Asthma Mother   . Hypertension Father    History  Substance Use Topics  . Smoking status: Former Games developer  . Smokeless tobacco: Not on file  . Alcohol Use: Yes   Comment: occ    Review of Systems  Constitutional: Negative for fatigue.  HENT: Negative.   Eyes: Negative.   Respiratory: Negative for shortness of breath.   Cardiovascular: Negative for chest pain and palpitations.  Gastrointestinal: Negative for nausea, vomiting, abdominal pain and diarrhea.  Neurological: Negative.     Allergies  Penicillins  Home Medications   Prior to Admission medications   Medication Sig Start Date End Date Taking? Authorizing Provider  ALPRAZolam Prudy Feeler) 1 MG tablet Take 1 mg by mouth 2 (two) times daily as needed for anxiety.    Historical Provider, MD  aspirin 81 MG tablet Take 81 mg by mouth daily.    Historical Provider, MD  atorvastatin (LIPITOR) 20 MG tablet Take 1 tablet (20 mg total) by mouth daily at 6 PM. 07/07/14   Orpah Cobb, MD  metoprolol tartrate (LOPRESSOR) 12.5 mg TABS Take 12.5 mg by mouth 2 (two) times daily. 05/02/12   Orpah Cobb, MD  omeprazole (PRILOSEC) 20 MG capsule Take 20 mg by mouth 2 (two) times daily before a meal.    Historical Provider, MD  oxyCODONE-acetaminophen (PERCOCET/ROXICET) 5-325 MG per tablet Take 1 tablet by mouth every 8 (eight) hours as needed for severe pain (pain).    Historical Provider, MD   BP 144/80 mmHg  Pulse 83  Temp(Src) 97.7 F (36.5 C) (Oral)  Resp 19  Ht  (1.702 m)  Wt 190 lb (86.183 kg)  BMI 29.75 kg/m2  SpO2 99% Physical Exam  Constitutional: He is oriented to person, place, and time. He appears well-developed and well-nourished. No distress.  HENT:  Head: Normocephalic and atraumatic.  Right Ear: External ear normal.  Left Ear: External ear normal.  Nose: Nose normal.  Mouth/Throat: Oropharynx is clear and moist.  Eyes: Conjunctivae and EOM are normal. Pupils are equal, round, and reactive to light. Right eye exhibits no discharge. Left eye exhibits no discharge. No scleral icterus.  Neck: Normal range of motion. No JVD present. No tracheal deviation present.  Cardiovascular:  Normal rate, regular rhythm, normal heart sounds and intact distal pulses.  Exam reveals no gallop and no friction rub.   No murmur heard. Pulmonary/Chest: Effort normal and breath sounds normal. No respiratory distress. He has no wheezes. He has no rales. He exhibits no tenderness.  Abdominal: Soft. Bowel sounds are normal. He exhibits no distension.  Musculoskeletal:       Left knee: He exhibits decreased range of motion, swelling, effusion and erythema. He exhibits no deformity, no laceration, no LCL laxity, normal patellar mobility, no bony tenderness and no MCL laxity. No medial joint line, no lateral joint line, no MCL and no LCL tenderness noted.  Neurological: He is alert and oriented to person, place, and time. He exhibits normal muscle tone. Coordination normal.  Skin: Skin is warm and dry. No rash noted. He is not diaphoretic. No pallor.  Psychiatric: He has a normal mood and affect. His behavior is normal. Judgment and thought content normal.  Nursing note and vitals reviewed.   ED Course  Procedures (including critical care time) Labs Review Labs Reviewed - No data to display  Imaging Review No results found.   EKG Interpretation None      MDM   Final diagnoses:  None   Left knee pain, ddx septic arthritis versus gout flare, more suspicious for gout with recent alcohol consumption, and chart documentation of similar symptoms in other joints.  Plan to treat pain, limited with NSAID contraindication, although I see no diagnosis of chronic kidney disease in the chart, he has had intermittent decrease in renal function, last labs show GFR of 59 consistent with CKD stage IV.  Due to the late presentation, joint inflammation for almost a week, initiating colchicine at this time may not be very helpful, patient was educated about use of colchicine within the first day or 2 if symptoms.  Patient was given morphine injection here in the ER to get on top of this pain, and he was  prescribed indomethacin, and encouraged follow-up with his primary care provider for education and treatment options for his gout. Patient was given handout information regarding gout disease and low purine diet.  He was also given a short supply of pain medicine to give him a few days of relief, while he arranges follow-up with PCP.  Medications  morphine 4 MG/ML injection 4 mg (4 mg Intramuscular Given 02/19/15 0905)  ondansetron (ZOFRAN) tablet 4 mg (4 mg Oral Given 02/19/15 0912)   Filed Vitals:   02/19/15 0834 02/19/15 1001  BP: 144/80 159/85  Pulse: 83 84  Temp: 97.7 F (36.5 C) 98.1 F (36.7 C)  TempSrc: Oral Oral  Resp: 19 18  Height:  (1.702 m)   Weight: 190 lb (86.183 kg)   SpO2: 99% 98%   Patient was afebrile and not tachycardic, with mild hypertension, was more couple after pain medicine and was stable  for discharge home.  Danelle Berry, PA-C 02/23/15 2313  Blane Ohara, MD 02/28/15 2215

## 2015-02-19 NOTE — ED Notes (Signed)
Pt reports pain to LT knee.

## 2015-02-19 NOTE — ED Notes (Signed)
Declined W/C at D/C and was escorted to lobby by RN. 

## 2015-02-19 NOTE — Discharge Instructions (Signed)
Gout Gout is an inflammatory arthritis caused by a buildup of uric acid crystals in the joints. Uric acid is a chemical that is normally present in the blood. When the level of uric acid in the blood is too high it can form crystals that deposit in your joints and tissues. This causes joint redness, soreness, and swelling (inflammation). Repeat attacks are common. Over time, uric acid crystals can form into masses (tophi) near a joint, destroying bone and causing disfigurement. Gout is treatable and often preventable. CAUSES  The disease begins with elevated levels of uric acid in the blood. Uric acid is produced by your body when it breaks down a naturally found substance called purines. Certain foods you eat, such as meats and fish, contain high amounts of purines. Causes of an elevated uric acid level include:  Being passed down from parent to child (heredity).  Diseases that cause increased uric acid production (such as obesity, psoriasis, and certain cancers).  Excessive alcohol use.  Diet, especially diets rich in meat and seafood.  Medicines, including certain cancer-fighting medicines (chemotherapy), water pills (diuretics), and aspirin.  Chronic kidney disease. The kidneys are no longer able to remove uric acid well.  Problems with metabolism. Conditions strongly associated with gout include:  Obesity.  High blood pressure.  High cholesterol.  Diabetes. Not everyone with elevated uric acid levels gets gout. It is not understood why some people get gout and others do not. Surgery, joint injury, and eating too much of certain foods are some of the factors that can lead to gout attacks. SYMPTOMS   An attack of gout comes on quickly. It causes intense pain with redness, swelling, and warmth in a joint.  Fever can occur.  Often, only one joint is involved. Certain joints are more commonly involved:  Base of the big toe.  Knee.  Ankle.  Wrist.  Finger. Without  treatment, an attack usually goes away in a few days to weeks. Between attacks, you usually will not have symptoms, which is different from many other forms of arthritis. DIAGNOSIS  Your caregiver will suspect gout based on your symptoms and exam. In some cases, tests may be recommended. The tests may include:  Blood tests.  Urine tests.  X-rays.  Joint fluid exam. This exam requires a needle to remove fluid from the joint (arthrocentesis). Using a microscope, gout is confirmed when uric acid crystals are seen in the joint fluid. TREATMENT  There are two phases to gout treatment: treating the sudden onset (acute) attack and preventing attacks (prophylaxis).  Treatment of an Acute Attack.  Medicines are used. These include anti-inflammatory medicines or steroid medicines.  An injection of steroid medicine into the affected joint is sometimes necessary.  The painful joint is rested. Movement can worsen the arthritis.  You may use warm or cold treatments on painful joints, depending which works best for you.  Treatment to Prevent Attacks.  If you suffer from frequent gout attacks, your caregiver may advise preventive medicine. These medicines are started after the acute attack subsides. These medicines either help your kidneys eliminate uric acid from your body or decrease your uric acid production. You may need to stay on these medicines for a very long time.  The early phase of treatment with preventive medicine can be associated with an increase in acute gout attacks. For this reason, during the first few months of treatment, your caregiver may also advise you to take medicines usually used for acute gout treatment. Be sure you   understand your caregiver's directions. Your caregiver may make several adjustments to your medicine dose before these medicines are effective.  Discuss dietary treatment with your caregiver or dietitian. Alcohol and drinks high in sugar and fructose and foods  such as meat, poultry, and seafood can increase uric acid levels. Your caregiver or dietitian can advise you on drinks and foods that should be limited. HOME CARE INSTRUCTIONS   Do not take aspirin to relieve pain. This raises uric acid levels.  Only take over-the-counter or prescription medicines for pain, discomfort, or fever as directed by your caregiver.  Rest the joint as much as possible. When in bed, keep sheets and blankets off painful areas.  Keep the affected joint raised (elevated).  Apply warm or cold treatments to painful joints. Use of warm or cold treatments depends on which works best for you.  Use crutches if the painful joint is in your leg.  Drink enough fluids to keep your urine clear or pale yellow. This helps your body get rid of uric acid. Limit alcohol, sugary drinks, and fructose drinks.  Follow your dietary instructions. Pay careful attention to the amount of protein you eat. Your daily diet should emphasize fruits, vegetables, whole grains, and fat-free or low-fat milk products. Discuss the use of coffee, vitamin C, and cherries with your caregiver or dietitian. These may be helpful in lowering uric acid levels.  Maintain a healthy body weight. SEEK MEDICAL CARE IF:   You develop diarrhea, vomiting, or any side effects from medicines.  You do not feel better in 24 hours, or you are getting worse. SEEK IMMEDIATE MEDICAL CARE IF:   Your joint becomes suddenly more tender, and you have chills or a fever. MAKE SURE YOU:   Understand these instructions.  Will watch your condition.  Will get help right away if you are not doing well or get worse. Document Released: 08/23/2000 Document Revised: 01/10/2014 Document Reviewed: 04/08/2012 Exit[employer] Patient Information 2015 [employer]. This information is not intended to replace advice given to you by your health care provider. Make sure you discuss any questions you have with your health care  provider. Low-Purine Diet Purines are compounds that affect the level of uric acid in your body. A low-purine diet is a diet that is low in purines. Eating a low-purine diet can prevent the level of uric acid in your body from getting too high and causing gout or kidney stones or both. WHAT DO I NEED TO KNOW ABOUT THIS DIET?  Choose low-purine foods. Examples of low-purine foods are listed in the next section.  Drink plenty of fluids, especially water. Fluids can help remove uric acid from your body. Try to drink 8-16 cups (1.9-3.8 L) a day.  Limit foods high in fat, especially saturated fat, as fat makes it harder for the body to get rid of uric acid. Foods high in saturated fat include pizza, cheese, ice cream, whole milk, fried foods, and gravies. Choose foods that are lower in fat and lean sources of protein. Use olive oil when cooking as it contains healthy fats that are not high in saturated fat.  Limit alcohol. Alcohol interferes with the elimination of uric acid from your body. If you are having a gout attack, avoid all alcohol.  Keep in mind that different people's bodies react differently to different foods. You will probably learn over time which foods do or do not affect you. If you discover that a food tends to cause your gout to flare up,   avoid eating that food. You can more freely enjoy foods that do not cause problems. If you have any questions about a food item, talk to your dietitian or health care provider. WHICH FOODS ARE LOW, MODERATE, AND HIGH IN PURINES? The following is a list of foods that are low, moderate, and high in purines. You can eat any amount of the foods that are low in purines. You may be able to have small amounts of foods that are moderate in purines. Ask your health care provider how much of a food moderate in purines you can have. Avoid foods high in purines. Grains  Foods low in purines: Enriched white bread, pasta, rice, cake, cornbread, popcorn.  Foods  moderate in purines: Whole-grain breads and cereals, wheat germ, bran, oatmeal. Uncooked oatmeal. Dry wheat bran or wheat germ.  Foods high in purines: Pancakes, French toast, biscuits, muffins. Vegetables  Foods low in purines: All vegetables, except those that are moderate in purines.  Foods moderate in purines: Asparagus, cauliflower, spinach, mushrooms, green peas. Fruits  All fruits are low in purines. Meats and other Protein Foods  Foods low in purines: Eggs, nuts, peanut butter.  Foods moderate in purines: 80-90% lean beef, lamb, veal, pork, poultry, fish, eggs, peanut butter, nuts. Crab, lobster, oysters, and shrimp. Cooked dried beans, peas, and lentils.  Foods high in purines: Anchovies, sardines, herring, mussels, tuna, codfish, scallops, trout, and haddock. Bacon. Organ meats (such as liver or kidney). Tripe. Game meat. Goose. Sweetbreads. Dairy  All dairy foods are low in purines. Low-fat and fat-free dairy products are best because they are low in saturated fat. Beverages  Drinks low in purines: Water, carbonated beverages, tea, coffee, cocoa.  Drinks moderate in purines: Soft drinks and other drinks sweetened with high-fructose corn syrup. Juices. To find whether a food or drink is sweetened with high-fructose corn syrup, look at the ingredients list.  Drinks high in purines: Alcoholic beverages (such as beer). Condiments  Foods low in purines: Salt, herbs, olives, pickles, relishes, vinegar.  Foods moderate in purines: Butter, margarine, oils, mayonnaise. Fats and Oils  Foods low in purines: All types, except gravies and sauces made with meat.  Foods high in purines: Gravies and sauces made with meat. Other Foods  Foods low in purines: Sugars, sweets, gelatin. Cake. Soups made without meat.  Foods moderate in purines: Meat-based or fish-based soups, broths, or bouillons. Foods and drinks sweetened with high-fructose corn syrup.  Foods high in purines:  High-fat desserts (such as ice cream, cookies, cakes, pies, doughnuts, and chocolate). Contact your dietitian for more information on foods that are not listed here. Document Released: 12/21/2010 Document Revised: 08/31/2013 Document Reviewed: 08/02/2013 Exit[employer] Patient Information 2015 [employer]. This information is not intended to replace advice given to you by your health care provider. Make sure you discuss any questions you have with your health care provider.  

## 2015-02-19 NOTE — ED Notes (Signed)
Pt reports multiple injection to LT knee for pain.

## 2015-02-19 NOTE — ED Notes (Signed)
PT observed walking with crutches. Pt tol. Well.

## 2015-03-09 ENCOUNTER — Other Ambulatory Visit (HOSPITAL_COMMUNITY): Payer: Self-pay | Admitting: Psychiatry

## 2015-03-09 ENCOUNTER — Ambulatory Visit (HOSPITAL_COMMUNITY)
Admission: RE | Admit: 2015-03-09 | Discharge: 2015-03-09 | Disposition: A | Discharge status: Home / Self Care | Payer: Medicaid Other | Source: Ambulatory Visit | Attending: Psychiatry | Admitting: Psychiatry

## 2015-03-09 DIAGNOSIS — M5442 Lumbago with sciatica, left side: Secondary | ICD-10-CM | POA: Diagnosis not present

## 2015-03-09 DIAGNOSIS — G8929 Other chronic pain: Secondary | ICD-10-CM | POA: Insufficient documentation

## 2015-03-09 DIAGNOSIS — M5440 Lumbago with sciatica, unspecified side: Secondary | ICD-10-CM

## 2015-08-25 ENCOUNTER — Other Ambulatory Visit: Payer: Self-pay | Admitting: Orthopaedic Surgery

## 2015-08-25 DIAGNOSIS — M545 Low back pain: Secondary | ICD-10-CM

## 2015-09-07 ENCOUNTER — Other Ambulatory Visit: Payer: Medicaid Other

## 2015-09-13 ENCOUNTER — Ambulatory Visit
Admission: RE | Admit: 2015-09-13 | Discharge: 2015-09-13 | Disposition: A | Discharge status: Home / Self Care | Payer: Medicare HMO | Source: Ambulatory Visit | Attending: Cardiovascular Disease | Admitting: Cardiovascular Disease

## 2015-09-13 ENCOUNTER — Other Ambulatory Visit: Payer: Self-pay | Admitting: Cardiovascular Disease

## 2015-09-13 DIAGNOSIS — R0789 Other chest pain: Secondary | ICD-10-CM

## 2015-09-15 ENCOUNTER — Other Ambulatory Visit: Payer: Medicaid Other

## 2015-09-21 ENCOUNTER — Inpatient Hospital Stay (HOSPITAL_COMMUNITY)
Admission: AD | Admit: 2015-09-21 | Discharge: 2015-09-22 | DRG: 313 | Disposition: A | Discharge status: Home / Self Care | Payer: Medicare HMO | Source: Ambulatory Visit | Attending: Cardiovascular Disease | Admitting: Cardiovascular Disease

## 2015-09-21 DIAGNOSIS — Z79899 Other long term (current) drug therapy: Secondary | ICD-10-CM | POA: Diagnosis not present

## 2015-09-21 DIAGNOSIS — F329 Major depressive disorder, single episode, unspecified: Secondary | ICD-10-CM | POA: Diagnosis present

## 2015-09-21 DIAGNOSIS — K219 Gastro-esophageal reflux disease without esophagitis: Secondary | ICD-10-CM | POA: Diagnosis present

## 2015-09-21 DIAGNOSIS — I10 Essential (primary) hypertension: Secondary | ICD-10-CM | POA: Diagnosis present

## 2015-09-21 DIAGNOSIS — F419 Anxiety disorder, unspecified: Secondary | ICD-10-CM | POA: Diagnosis present

## 2015-09-21 DIAGNOSIS — E785 Hyperlipidemia, unspecified: Secondary | ICD-10-CM | POA: Diagnosis present

## 2015-09-21 DIAGNOSIS — M1990 Unspecified osteoarthritis, unspecified site: Secondary | ICD-10-CM | POA: Diagnosis present

## 2015-09-21 DIAGNOSIS — R079 Chest pain, unspecified: Secondary | ICD-10-CM | POA: Diagnosis present

## 2015-09-21 DIAGNOSIS — Z951 Presence of aortocoronary bypass graft: Secondary | ICD-10-CM | POA: Diagnosis not present

## 2015-09-21 DIAGNOSIS — I509 Heart failure, unspecified: Secondary | ICD-10-CM | POA: Diagnosis present

## 2015-09-21 DIAGNOSIS — Z955 Presence of coronary angioplasty implant and graft: Secondary | ICD-10-CM | POA: Diagnosis not present

## 2015-09-21 DIAGNOSIS — Z87891 Personal history of nicotine dependence: Secondary | ICD-10-CM | POA: Diagnosis not present

## 2015-09-21 DIAGNOSIS — J449 Chronic obstructive pulmonary disease, unspecified: Secondary | ICD-10-CM | POA: Diagnosis present

## 2015-09-21 DIAGNOSIS — I110 Hypertensive heart disease with heart failure: Secondary | ICD-10-CM | POA: Diagnosis present

## 2015-09-21 DIAGNOSIS — Z7982 Long term (current) use of aspirin: Secondary | ICD-10-CM

## 2015-09-21 DIAGNOSIS — I2510 Atherosclerotic heart disease of native coronary artery without angina pectoris: Secondary | ICD-10-CM | POA: Diagnosis present

## 2015-09-21 LAB — COMPREHENSIVE METABOLIC PANEL
ALK PHOS: 108 U/L (ref 38–126)
ALT: 18 U/L (ref 17–63)
ANION GAP: 10 (ref 5–15)
AST: 24 U/L (ref 15–41)
Albumin: 3.4 g/dL — ABNORMAL LOW (ref 3.5–5.0)
BILIRUBIN TOTAL: 0.3 mg/dL (ref 0.3–1.2)
BUN: 10 mg/dL (ref 6–20)
CALCIUM: 8.8 mg/dL — AB (ref 8.9–10.3)
CO2: 25 mmol/L (ref 22–32)
CREATININE: 1.74 mg/dL — AB (ref 0.61–1.24)
Chloride: 106 mmol/L (ref 101–111)
GFR calc non Af Amer: 39 mL/min — ABNORMAL LOW (ref >60)
GFR, EST AFRICAN AMERICAN: 46 mL/min — AB (ref >60)
Glucose, Bld: 135 mg/dL — ABNORMAL HIGH (ref 65–99)
Potassium: 4.2 mmol/L (ref 3.5–5.1)
Sodium: 141 mmol/L (ref 135–145)
TOTAL PROTEIN: 5.9 g/dL — AB (ref 6.5–8.1)

## 2015-09-21 LAB — CBC WITH DIFFERENTIAL/PLATELET
BASOS PCT: 0 %
Basophils Absolute: 0 10*3/uL (ref 0.0–0.1)
EOS ABS: 0.1 10*3/uL (ref 0.0–0.7)
EOS PCT: 2 %
HCT: 39.1 % (ref 39.0–52.0)
HEMOGLOBIN: 12.6 g/dL — AB (ref 13.0–17.0)
LYMPHS ABS: 2.4 10*3/uL (ref 0.7–4.0)
Lymphocytes Relative: 46 %
MCH: 27.6 pg (ref 26.0–34.0)
MCHC: 32.2 g/dL (ref 30.0–36.0)
MCV: 85.6 fL (ref 78.0–100.0)
MONOS PCT: 6 %
Monocytes Absolute: 0.3 10*3/uL (ref 0.1–1.0)
NEUTROS PCT: 46 %
Neutro Abs: 2.3 10*3/uL (ref 1.7–7.7)
PLATELETS: 148 10*3/uL — AB (ref 150–400)
RBC: 4.57 MIL/uL (ref 4.22–5.81)
RDW: 14.8 % (ref 11.5–15.5)
WBC: 5.1 10*3/uL (ref 4.0–10.5)

## 2015-09-21 LAB — TROPONIN I: Troponin I: <0.03 ng/mL (ref <0.031)

## 2015-09-21 MED ORDER — METOPROLOL TARTRATE 12.5 MG HALF TABLET
12.5000 mg | ORAL_TABLET | Freq: Two times a day (BID) | ORAL | Status: DC
  Administered 2015-09-21 – 2015-09-22 (×2): 12.5 mg via ORAL
  Filled 2015-09-21 (×2): qty 1

## 2015-09-21 MED ORDER — PANTOPRAZOLE SODIUM 40 MG PO TBEC
40.0000 mg | DELAYED_RELEASE_TABLET | Freq: Every day | ORAL | Status: DC
  Administered 2015-09-22: 40 mg via ORAL
  Filled 2015-09-21: qty 1

## 2015-09-21 MED ORDER — ASPIRIN 300 MG RE SUPP: 300.0000 mg | RECTAL | Status: AC

## 2015-09-21 MED ORDER — ASPIRIN EC 81 MG PO TBEC
81.0000 mg | DELAYED_RELEASE_TABLET | Freq: Every day | ORAL | Status: DC
  Administered 2015-09-22: 81 mg via ORAL
  Filled 2015-09-21: qty 1

## 2015-09-21 MED ORDER — SODIUM CHLORIDE 0.9 % IJ SOLN: 3.0000 mL | INTRAMUSCULAR | Status: DC | PRN

## 2015-09-21 MED ORDER — ONDANSETRON HCL 4 MG/2ML IJ SOLN: 4.0000 mg | Freq: Four times a day (QID) | INTRAMUSCULAR | Status: DC | PRN

## 2015-09-21 MED ORDER — ATORVASTATIN CALCIUM 20 MG PO TABS
20.0000 mg | ORAL_TABLET | Freq: Every day | ORAL | Status: DC
  Administered 2015-09-22: 20 mg via ORAL
  Filled 2015-09-21: qty 1

## 2015-09-21 MED ORDER — ASPIRIN 81 MG PO CHEW: 324.0000 mg | CHEWABLE_TABLET | ORAL | Status: AC

## 2015-09-21 MED ORDER — HEPARIN BOLUS VIA INFUSION
4000.0000 [IU] | Freq: Once | INTRAVENOUS | Status: AC
  Administered 2015-09-21: 4000 [IU] via INTRAVENOUS
  Filled 2015-09-21: qty 4000

## 2015-09-21 MED ORDER — NITROGLYCERIN 0.4 MG SL SUBL
0.4000 mg | SUBLINGUAL_TABLET | SUBLINGUAL | Status: DC | PRN
Start: 2015-09-21 — End: 2015-09-22

## 2015-09-21 MED ORDER — ACETAMINOPHEN 325 MG PO TABS: 650.0000 mg | ORAL_TABLET | ORAL | Status: DC | PRN

## 2015-09-21 MED ORDER — HEPARIN (PORCINE) IN NACL 100-0.45 UNIT/ML-% IJ SOLN
1000.0000 [IU]/h | INTRAMUSCULAR | Status: DC
  Administered 2015-09-21 – 2015-09-22 (×2): 1000 [IU]/h via INTRAVENOUS
  Filled 2015-09-21 (×2): qty 250

## 2015-09-21 MED ORDER — SODIUM CHLORIDE 0.9 % IV SOLN: 250.0000 mL | INTRAVENOUS | Status: DC | PRN

## 2015-09-21 MED ORDER — SODIUM CHLORIDE 0.9 % IJ SOLN
3.0000 mL | Freq: Two times a day (BID) | INTRAMUSCULAR | Status: DC
  Administered 2015-09-21: 3 mL via INTRAVENOUS

## 2015-09-21 NOTE — Progress Notes (Signed)
ANTICOAGULATION CONSULT NOTE - Initial Consult  Pharmacy Consult for IV Heparin Indication: chest pain/ACS  Allergies  Allergen Reactions  . Penicillins Hives    Patient Measurements: Height: 5\' 7"  (170.2 cm) Weight: 187 lb 14.4 oz (85.231 kg) IBW/kg (Calculated) : 66.1 Heparin Dosing Weight: 83.4 kg  Vital Signs: Temp: 98.1 F (36.7 C) (01/12 1809) Temp Source: Oral (01/12 1809) BP: 125/77 mmHg (01/12 1809) Pulse Rate: 95 (01/12 1809)  Labs: No results for input(s): HGB, HCT, PLT, APTT, LABPROT, INR, HEPARINUNFRC, CREATININE, CKTOTAL, CKMB, TROPONINI in the last 72 hours.  CrCl cannot be calculated (Patient has no serum creatinine result on file.).   Medical History: Past Medical History  Diagnosis Date  . Arthritis   . CHF (congestive heart failure)   . Depression   . GERD (gastroesophageal reflux disease)   . Hyperlipidemia   . Hypertension   . Cervical spine disease    Assessment: 65 year old male admitted with ACS to start IV heparin per pharmacy dosing.  Patient was not on chronic anticoagulation. Past INR's within normal limits. Baseline labs are pending. Plan for stress test in AM.   Goal of Therapy:  Heparin level 0.3-0.7 units/ml Monitor platelets by anticoagulation protocol: Yes   Plan:  Give 4000 units bolus x 1 Start heparin infusion at 1000 units/hr Check anti-Xa level in 6 hours and daily while on heparin Continue to monitor H&H and platelets  Link SnufferJessica Madaline Lefeber, PharmD, BCPS Clinical Pharmacist [phone] 09/21/2015,6:24 PM

## 2015-09-21 NOTE — H&P (Signed)
Referring Physician:  Raliegh ScarletJames J [name] is an 65 y.o. male.                       Chief Complaint: Chest pain  HPI: 65 years old male with known CAD and CABG has substernal chest pain, dull, non-radiating but with shortness of breath. No fever or cough.  Past Medical History  Diagnosis Date  . Arthritis   . CHF (congestive heart failure)   . Depression   . GERD (gastroesophageal reflux disease)   . Hyperlipidemia   . Hypertension   . Cervical spine disease       Past Surgical History  Procedure Laterality Date  . Triple bypass    . Breast lump removal    . Left heart catheterization with coronary/graft angiogram Right 05/01/2012    Procedure: LEFT HEART CATHETERIZATION WITH Isabel CapriceORONARY/GRAFT ANGIOGRAM;  Surgeon: Ricki RodriguezAjay S Khilee Hendricksen, MD;  Location: [facility];  Service: Cardiovascular;  Laterality: Right;    Family History  Problem Relation Age of Onset  . Kidney disease Mother   . Asthma Mother   . Hypertension Father    Social History:  reports that he has quit smoking. He does not have any smokeless tobacco history on file. He reports that he drinks alcohol. He reports that he uses illicit drugs (Marijuana).  Allergies:  Allergies  Allergen Reactions  . Penicillins Hives    Medications Prior to Admission  Medication Sig Dispense Refill  . ALPRAZolam (XANAX) 1 MG tablet Take 1 mg by mouth 2 (two) times daily as needed for anxiety.    Marland Kitchen. aspirin 81 MG tablet Take 81 mg by mouth daily.    Marland Kitchen. atorvastatin (LIPITOR) 20 MG tablet Take 1 tablet (20 mg total) by mouth daily at 6 PM. 30 tablet 3  . indomethacin (INDOCIN) 25 MG capsule Take 1 capsule (25 mg total) by mouth 3 (three) times daily with meals. 30 capsule 0  . metoprolol tartrate (LOPRESSOR) 12.5 mg TABS Take 12.5 mg by mouth 2 (two) times daily.    Marland Kitchen. omeprazole (PRILOSEC) 20 MG capsule Take 20 mg by mouth 2 (two) times daily before a meal.    . ondansetron (ZOFRAN) 4 MG tablet Take 1 tablet (4 mg total) by mouth every 8  (eight) hours as needed for nausea or vomiting. 10 tablet 0  . OxyCODONE HCl, Abuse Deter, (OXAYDO) 5 MG TABA Take 5 mg by mouth every 6 (six) hours as needed. 12 tablet 0    No results found for this or any previous visit (from the past 48 hour(s)). No results found.  Review Of Systems Constitutional: Positive for diaphoresis. Negative for fever.  Respiratory: Negative for shortness of breath.  Cardiovascular: Positive for chest pain. Negative for syncope.  Gastrointestinal: Positive for heartburn and abdominal pain. Negative for vomiting and blood in stool.  Musculoskeletal: Negative for back pain.  Neurological: Positive for dizziness. Negative for weakness.  All other systems reviewed and are negative.  Blood pressure 125/77, pulse 95, temperature 98.1 F (36.7 C), temperature source Oral, resp. rate 18, height 5\' 7"  (1.702 m), weight 85.231 kg (187 lb 14.4 oz), SpO2 95 %.  CONSTITUTIONAL: Well developed/well nourished  HEAD: Normocephalic/atraumatic  EYES: Brown eyes, conj-pink, sclera-white. Wears glasses. EOMI/PERRL  ENMT: Mucous membranes moist  NECK: supple. CV: S1/S2 noted. No rubs/gallops noted II/VI systolic murmur. LUNGS: Lungs are clear to auscultation bilaterally, no chest wall tenderness. ABDOMEN: soft, nontender, no rebound or guarding  NEURO: Pt is  awake/alert, moves all 4 extremities.Cranial nerves grossly intact.  EXTREMITIES: pulses normal, full ROM  SKIN: warm, color normal  PSYCH: no abnormalities of mood noted  Assessment/Plan Chest pain CAD Stent in LCX CABG Hypertension COPD Anxiety  Admit Nuclear stress test in AM.  Ricki Rodriguez, MD  09/21/2015, 6:28 PM

## 2015-09-21 NOTE — Progress Notes (Signed)
Pt arrived on floor. Pt oriented to unit and equipment. Pt call light within reach.  Leonidas Rombergaitlin S Bumbledare, RN

## 2015-09-22 ENCOUNTER — Inpatient Hospital Stay (HOSPITAL_COMMUNITY): Payer: Medicare HMO

## 2015-09-22 ENCOUNTER — Encounter (HOSPITAL_COMMUNITY): Payer: Self-pay | Admitting: Family

## 2015-09-22 LAB — LIPID PANEL
CHOLESTEROL: 176 mg/dL (ref 0–200)
HDL: 38 mg/dL — ABNORMAL LOW (ref >40)
LDL Cholesterol: 107 mg/dL — ABNORMAL HIGH (ref 0–99)
TRIGLYCERIDES: 155 mg/dL — AB (ref <150)
Total CHOL/HDL Ratio: 4.6 RATIO
VLDL: 31 mg/dL (ref 0–40)

## 2015-09-22 LAB — BASIC METABOLIC PANEL
Anion gap: 5 (ref 5–15)
BUN: 11 mg/dL (ref 6–20)
CALCIUM: 8.6 mg/dL — AB (ref 8.9–10.3)
CO2: 27 mmol/L (ref 22–32)
CREATININE: 1.47 mg/dL — AB (ref 0.61–1.24)
Chloride: 108 mmol/L (ref 101–111)
GFR calc non Af Amer: 48 mL/min — ABNORMAL LOW (ref >60)
GFR, EST AFRICAN AMERICAN: 56 mL/min — AB (ref >60)
Glucose, Bld: 101 mg/dL — ABNORMAL HIGH (ref 65–99)
Potassium: 4.4 mmol/L (ref 3.5–5.1)
SODIUM: 140 mmol/L (ref 135–145)

## 2015-09-22 LAB — CBC
HEMATOCRIT: 42.6 % (ref 39.0–52.0)
HEMOGLOBIN: 13.6 g/dL (ref 13.0–17.0)
MCH: 27.2 pg (ref 26.0–34.0)
MCHC: 31.9 g/dL (ref 30.0–36.0)
MCV: 85.2 fL (ref 78.0–100.0)
Platelets: 159 10*3/uL (ref 150–400)
RBC: 5 MIL/uL (ref 4.22–5.81)
RDW: 14.6 % (ref 11.5–15.5)
WBC: 5.4 10*3/uL (ref 4.0–10.5)

## 2015-09-22 LAB — TROPONIN I: Troponin I: <0.03 ng/mL (ref <0.031)

## 2015-09-22 LAB — PROTIME-INR
INR: 0.94 (ref 0.00–1.49)
Prothrombin Time: 12.8 seconds (ref 11.6–15.2)

## 2015-09-22 LAB — HEPARIN LEVEL (UNFRACTIONATED)
Heparin Unfractionated: 0.59 IU/mL (ref 0.30–0.70)
Heparin Unfractionated: 0.63 IU/mL (ref 0.30–0.70)

## 2015-09-22 MED ORDER — TECHNETIUM TC 99M SESTAMIBI GENERIC - CARDIOLITE
30.0000 | Freq: Once | INTRAVENOUS | Status: AC | PRN
  Administered 2015-09-22: 30 via INTRAVENOUS

## 2015-09-22 MED ORDER — TECHNETIUM TC 99M SESTAMIBI GENERIC - CARDIOLITE
10.0000 | Freq: Once | INTRAVENOUS | Status: AC | PRN
  Administered 2015-09-22: 10 via INTRAVENOUS

## 2015-09-22 MED ORDER — REGADENOSON 0.4 MG/5ML IV SOLN
INTRAVENOUS | Status: AC
  Filled 2015-09-22: qty 5

## 2015-09-22 MED ORDER — REGADENOSON 0.4 MG/5ML IV SOLN
0.4000 mg | Freq: Once | INTRAVENOUS | Status: AC
  Administered 2015-09-22: 0.4 mg via INTRAVENOUS
  Filled 2015-09-22: qty 5

## 2015-09-22 NOTE — Progress Notes (Signed)
ANTICOAGULATION CONSULT NOTE - Follow Up Consult  Pharmacy Consult for heparin Indication: chest pain/ACS   Labs:  Recent Labs  09/21/15 1855 09/22/15 0117 09/22/15 0308  HGB 12.6*  --   --   HCT 39.1  --   --   PLT 148*  --   --   HEPARINUNFRC  --   --  0.59  CREATININE 1.74*  --   --   TROPONINI <0.03 <0.03  --     Assessment/Plan:  65yo male therapeutic on heparin with initial dosing for CP. Will continue gtt at current rate and confirm stable with additional level.   Vernard GamblesVeronda Arionna Hoggard, PharmD, BCPS  09/22/2015,4:57 AM

## 2015-09-22 NOTE — Progress Notes (Signed)
ANTICOAGULATION CONSULT NOTE  Pharmacy Consult for IV Heparin Indication: chest pain/ACS  Allergies  Allergen Reactions  . Penicillins Hives    Patient Measurements: Height: 5\' 7"  (170.2 cm) Weight: 187 lb 14.4 oz (85.231 kg) IBW/kg (Calculated) : 66.1 Heparin Dosing Weight: 83.4 kg  Vital Signs: BP: 142/73 mmHg (01/13 0901)  Labs:  Recent Labs  09/21/15 1855 09/22/15 0117 09/22/15 0308 09/22/15 0633  HGB 12.6*  --   --  13.6  HCT 39.1  --   --  42.6  PLT 148*  --   --  159  LABPROT  --   --   --  12.8  INR  --   --   --  0.94  HEPARINUNFRC  --   --  0.59 0.63  CREATININE 1.74*  --   --  1.47*  TROPONINI <0.03 <0.03  --  <0.03    Estimated Creatinine Clearance: 52.2 mL/min (by C-G formula based on Cr of 1.47).  Assessment: 65 year old male admitted with r.o ACS on heparin per pharmacy dosing. Heparin level is at goal (HL= 0.63) on 1000 units/hr. Patient noted for stress test today.  Goal of Therapy:  Heparin level 0.3-0.7 units/ml Monitor platelets by anticoagulation protocol: Yes   Plan:  -No heparin changes needed -Daily heparin level and CBC  Harland GermanAndrew Kaity Pitstick, Pharm D 09/22/2015 10:46 AM

## 2015-09-22 NOTE — Discharge Summary (Signed)
Physician Discharge Summary  Patient ID: Marcus [name] MRN: [id number] DOB/AGE: 66/12/1950 66 y.o.  Admit date: 09/21/2015 Discharge date: 09/22/2015  Admission Diagnoses: Chest pain CAD Stent in LCX CABG Hypertension COPD Anxiety  Discharge Diagnoses:  Principal Problem: * Chest pain at rest * Active Problems:   Hypertension   Coronary artery disease   Stent in LCX   CABG   Hypertension   COPD   Anxiety  Discharged Condition: fair  Hospital Course: 65 years old male with known CAD and CABG has substernal and left lower chest pain, dull, non-radiating but with shortness of breath. No fever or cough. 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 His nuclear stress test was negative for reversible ischemia. He was advised to use pain medications as needed and avoid lifting heavy weights without assistance. He will be followed by me in 1 week and by primary doctor in 1 month or as needed.  Consults: cardiology  Significant Diagnostic Studies: labs: Normal CBC and BMET except mildly elevated creatinine and borderkline high blood sugar. Normal Troponin I. Hgb A1c was in process during discharge.  EKG-NSR, inferior wall ischemic changes.  Nuclear stress test with no reversible ischemia and 29 % EF with anterolateral and inferior and lateral wall areas of scar.  Treatments: cardiac meds: metoprolol, aspirin and atorvastatin  Discharge Exam: Blood pressure 134/57, pulse 78, temperature 98.3 F (36.8 C), temperature source Oral, resp. rate 18, height 5\' 7"  (1.702 m), weight 85.231 kg (187 lb 14.4 oz), SpO2 99 %. CONSTITUTIONAL: Well developed/well nourished  HEAD: Normocephalic/atraumatic  EYES: Brown eyes, conj-pink, sclera-white. Wears glasses. EOMI/PERRL  ENMT: Mucous membranes moist  NECK: supple. CV: S1/S2 noted. No rubs/gallops noted II/VI systolic murmur. LUNGS: Midline scar of surgery. Lungs are clear to auscultation bilaterally,  mild left sided chest wall tenderness. ABDOMEN: soft, nontender, no rebound or guarding  NEURO: Pt is awake/alert, moves all 4 extremities.Cranial nerves grossly intact.  EXTREMITIES: pulses normal, full ROM  SKIN: warm, color normal  PSYCH: no abnormalities of mood noted  Disposition: 01-Home or Self Care     Medication List    STOP taking these medications        simvastatin 20 MG tablet  Commonly known as:  ZOCOR      TAKE these medications        ALPRAZolam 1 MG tablet  Commonly known as:  XANAX  Take 1 mg by mouth 2 (two) times daily as needed for anxiety.     aspirin 81 MG tablet  Take 81 mg by mouth daily.     atorvastatin 20 MG tablet  Commonly known as:  LIPITOR  Take 1 tablet (20 mg total) by mouth daily at 6 PM.     gabapentin 300 MG capsule  Commonly known as:  NEURONTIN  Take 1 capsule by mouth 2 (two) times daily.     indomethacin 25 MG capsule  Commonly known as:  INDOCIN  Take 1 capsule (25 mg total) by mouth 3 (three) times daily with meals.     metoprolol tartrate 12.5 mg Tabs tablet  Commonly known as:  LOPRESSOR  Take 12.5 mg by mouth 2 (two) times daily.     omeprazole 20 MG capsule  Commonly known as:  PRILOSEC  Take 20 mg by mouth 2 (two) times daily before a meal.     ondansetron 4 MG tablet  Commonly known as:  ZOFRAN  Take 1 tablet (4 mg total) by mouth every 8 (  eight) hours as needed for nausea or vomiting.     OxyCODONE HCl (Abuse Deter) 5 MG Taba  Commonly known as:  OXAYDO  Take 5 mg by mouth every 6 (six) hours as needed.     tamsulosin 0.4 MG Caps capsule  Commonly known as:  FLOMAX  Take 1 capsule by mouth daily.     tiZANidine 4 MG tablet  Commonly known as:  ZANAFLEX  Take 1 tablet by mouth daily as needed. As needed for muscle spasms     traMADol 50 MG tablet  Commonly known as:  ULTRAM  Take 1 tablet by mouth daily.     zolpidem 10 MG tablet  Commonly known as:  AMBIEN  Take 1 tablet by mouth daily as  needed. As needed for sleep           Follow-up Information    Follow up with AVBUERE,EDWIN A, MD. Schedule an appointment as soon as possible for a visit in 1 month.   Specialty:  Internal Medicine   Contact information:   [address] [place] [address] [phone]       Follow up with [facility] S, MD. Schedule an appointment as soon as possible for a visit in 1 week.   Specialty:  Cardiology   Contact information:   [address] [place] [address] [phone]       Signed: Ricki Rodriguez 09/22/2015, 6:09 PM

## 2015-09-22 NOTE — Progress Notes (Signed)
Utilization review completed.  

## 2015-09-22 NOTE — Care Management Note (Signed)
Case Management Note Donn PieriniKristi Janina Trafton RN, BSN Unit 2W-Case Manager [phone]  Patient Details  Name: Raliegh ScarletJames J Savard MRN: [id number] Date of Birth: 05/26/1951  Subjective/Objective:    Pt admitted with chest pain, stress test done and abnormal, iv heparin started- possible cath over weekend                Action/Plan: PTA pt lived at home- anticipate return home, NCM to follow  Expected Discharge Date:                  Expected Discharge Plan:  Home/Self Care  In-House Referral:     Discharge planning Services  CM Consult  Post Acute Care Choice:    Choice offered to:     DME Arranged:    DME Agency:     HH Arranged:    HH Agency:     Status of Service:  In process, will continue to follow  Medicare Important Message Given:    Date Medicare IM Given:    Medicare IM give by:    Date Additional Medicare IM Given:    Additional Medicare Important Message give by:     If discussed at Long Length of Stay Meetings, dates discussed:    Additional Comments:  Darrold SpanWebster, Shanel Prazak Hall, RN 09/22/2015, 4:20 PM

## 2015-09-22 NOTE — Progress Notes (Signed)
Discussed with the patient and all questioned fully answered. He will call me if any problems arise.  Trayce Caravello S [place], RN   

## 2015-09-23 LAB — NM MYOCAR MULTI W/SPECT W/WALL MOTION / EF
CHL CUP STRESS STAGE 1 GRADE: 0 %
CHL CUP STRESS STAGE 1 HR: 67 {beats}/min
CHL CUP STRESS STAGE 1 SBP: 142 mmHg
CHL CUP STRESS STAGE 1 SPEED: 0 mph
CHL CUP STRESS STAGE 2 GRADE: 0 %
CHL CUP STRESS STAGE 2 SPEED: 0 mph
CHL CUP STRESS STAGE 3 DBP: 70 mmHg
CHL CUP STRESS STAGE 4 GRADE: 0 %
CHL CUP STRESS STAGE 4 SPEED: 0 mph
CHL CUP STRESS STAGE 5 GRADE: 0 %
CHL CUP STRESS STAGE 5 SPEED: 0 mph
CSEPPMHR: 52 %
Estimated workload: 1 METS
Peak BP: 142 mmHg
Peak HR: 82 {beats}/min
Stage 1 DBP: 70 mmHg
Stage 2 HR: 66 {beats}/min
Stage 3 Grade: 0 %
Stage 3 HR: 90 {beats}/min
Stage 3 SBP: 142 mmHg
Stage 3 Speed: 0 mph
Stage 4 DBP: 78 mmHg
Stage 4 HR: 83 {beats}/min
Stage 4 SBP: 140 mmHg
Stage 5 DBP: 73 mmHg
Stage 5 HR: 82 {beats}/min
Stage 5 SBP: 142 mmHg

## 2015-09-23 LAB — HEMOGLOBIN A1C
Hgb A1c MFr Bld: 6.3 % — ABNORMAL HIGH (ref 4.8–5.6)
MEAN PLASMA GLUCOSE: 134 mg/dL

## 2015-12-04 ENCOUNTER — Emergency Department (HOSPITAL_COMMUNITY)
Admission: EM | Admit: 2015-12-04 | Discharge: 2015-12-04 | Disposition: A | Discharge status: Home / Self Care | Payer: Medicare HMO | Attending: Emergency Medicine | Admitting: Emergency Medicine

## 2015-12-04 ENCOUNTER — Encounter (HOSPITAL_COMMUNITY): Payer: Self-pay | Admitting: Emergency Medicine

## 2015-12-04 DIAGNOSIS — Z79899 Other long term (current) drug therapy: Secondary | ICD-10-CM | POA: Insufficient documentation

## 2015-12-04 DIAGNOSIS — M1990 Unspecified osteoarthritis, unspecified site: Secondary | ICD-10-CM | POA: Diagnosis not present

## 2015-12-04 DIAGNOSIS — E785 Hyperlipidemia, unspecified: Secondary | ICD-10-CM | POA: Insufficient documentation

## 2015-12-04 DIAGNOSIS — F329 Major depressive disorder, single episode, unspecified: Secondary | ICD-10-CM | POA: Insufficient documentation

## 2015-12-04 DIAGNOSIS — Z7982 Long term (current) use of aspirin: Secondary | ICD-10-CM | POA: Diagnosis not present

## 2015-12-04 DIAGNOSIS — Z9889 Other specified postprocedural states: Secondary | ICD-10-CM | POA: Insufficient documentation

## 2015-12-04 DIAGNOSIS — H9203 Otalgia, bilateral: Secondary | ICD-10-CM | POA: Diagnosis not present

## 2015-12-04 DIAGNOSIS — R0989 Other specified symptoms and signs involving the circulatory and respiratory systems: Secondary | ICD-10-CM | POA: Insufficient documentation

## 2015-12-04 DIAGNOSIS — I509 Heart failure, unspecified: Secondary | ICD-10-CM | POA: Insufficient documentation

## 2015-12-04 DIAGNOSIS — K219 Gastro-esophageal reflux disease without esophagitis: Secondary | ICD-10-CM | POA: Diagnosis not present

## 2015-12-04 DIAGNOSIS — Z87891 Personal history of nicotine dependence: Secondary | ICD-10-CM | POA: Diagnosis not present

## 2015-12-04 DIAGNOSIS — I10 Essential (primary) hypertension: Secondary | ICD-10-CM | POA: Insufficient documentation

## 2015-12-04 MED ORDER — CETIRIZINE HCL 10 MG PO CAPS: 1.0000 | ORAL_CAPSULE | ORAL | Status: DC

## 2015-12-04 NOTE — Discharge Instructions (Signed)
Your discomfort may be due to allergies and excess fluid buildup. Please take your allergy medicine/antihistamine as prescribed to help with this problem. Continue taking your drops as prescribed by your doctor. Follow-up with your doctor next week for reevaluation. Return to ED for worsening symptoms as we discussed.  Earache An earache, also called otalgia, can be caused by many things. Pain from an earache can be sharp, dull, or burning. The pain may be temporary or constant. Earaches can be caused by problems with the ear, such as infection in either the middle ear or the ear canal, injury, impacted ear wax, middle ear pressure, or a foreign body in the ear. Ear pain can also result from problems in other areas. This is called referred pain. For example, pain can come from a sore throat, a tooth infection, or problems with the jaw or the joint between the jaw and the skull (temporomandibular joint, or TMJ). The cause of an earache is not always easy to identify. Watchful waiting may be appropriate for some earaches until a clear cause of the pain can be found. HOME CARE INSTRUCTIONS Watch your condition for any changes. The following actions may help to lessen any discomfort that you are feeling:  Take medicines only as directed by your health care provider. This includes ear drops.  Apply ice to your outer ear to help reduce pain.  Put ice in a plastic bag.  Place a towel between your skin and the bag.  Leave the ice on for 20 minutes, 2-3 times per day.  Do not put anything in your ear other than medicine that is prescribed by your health care provider.  Try resting in an upright position instead of lying down. This may help to reduce pressure in the middle ear and relieve pain.  Chew gum if it helps to relieve your ear pain.  Control any allergies that you have.  Keep all follow-up visits as directed by your health care provider. This is important. SEEK MEDICAL CARE IF:  Your  pain does not improve within 2 days.  You have a fever.  You have new or worsening symptoms. SEEK IMMEDIATE MEDICAL CARE IF:  You have a severe headache.  You have a stiff neck.  You have difficulty swallowing.  You have redness or swelling behind your ear.  You have drainage from your ear.  You have hearing loss.  You feel dizzy.   This information is not intended to replace advice given to you by your health care provider. Make sure you discuss any questions you have with your health care provider.   Document Released: 04/12/2004 Document Revised: 09/16/2014 Document Reviewed: 03/27/2014 Elsevier Interactive Patient Education [employer]2016 Elsevier Inc.

## 2015-12-04 NOTE — ED Notes (Signed)
Pt sts bilateral ear pain from ear infection; pt sts given drops but not helping

## 2015-12-04 NOTE — ED Provider Notes (Signed)
CSN: [id number]     Arrival date & time 12/04/15  1514 History  By signing my name below, I, Gonzella LexKimberly Bianca Gray, attest that this documentation has been prepared under the direction and in the presence of General MillsBenjamin Roshawna Colclasure, PA-C. Electronically Signed: Gonzella LexKimberly Bianca Gray, Scribe. 12/04/2015. 4:44 PM.   Chief Complaint  Patient presents with  . Otalgia   The history is provided by the patient. No language interpreter was used.   HPI Comments: Marcus [name] is a 65 y.o. male who presents to the Emergency Department complaining of sudden onset, constant, mild, bilateral otalgia which began about 12 days ago. Pt reported to see a physician 6 days ago, where he was prescribed ear drops and was told to report to the ER if the ear drops did not work in three days. Pt has been using the ear drops with no relief and so decided to come in today. He also reports onset of a mildly scratchy throat last night as well as recent "hot flashes". Pt denies any other associated symptoms, rhinorrhea, congestion, cough, sore throat, fever, numbness, weakness, recent, constant blurry vision or visual disturbances, facial pain, HA, and drainage from his ears. He also denies seasonal allergies, use of Q tips, recent swimming, and hx of DM. Pt has grandchildren but notes that they have not recently had any ear infections.   Past Medical History  Diagnosis Date  . Arthritis   . CHF (congestive heart failure) (HCC)   . Depression   . GERD (gastroesophageal reflux disease)   . Hyperlipidemia   . Hypertension   . Cervical spine disease    Past Surgical History  Procedure Laterality Date  . Triple bypass    . Breast lump removal    . Left heart catheterization with coronary/graft angiogram Right 05/01/2012    Procedure: LEFT HEART CATHETERIZATION WITH Isabel CapriceORONARY/GRAFT ANGIOGRAM;  Surgeon: Ricki RodriguezAjay S Kadakia, MD;  Location: [facility] CATH LAB;  Service: Cardiovascular;  Laterality: Right;   Family History  Problem Relation Age  of Onset  . Kidney disease Mother   . Asthma Mother   . Hypertension Father    Social History  Substance Use Topics  . Smoking status: Former Games developermoker  . Smokeless tobacco: None  . Alcohol Use: Yes     Comment: occ    Review of Systems  Constitutional: Negative for fever.       Hot flashes   HENT: Positive for ear pain. Negative for congestion, ear discharge, rhinorrhea and sore throat.        Scratchy throat   Eyes: Negative for visual disturbance.  Respiratory: Negative for cough.   Neurological: Negative for weakness, numbness and headaches.  All other systems reviewed and are negative.  Allergies  Penicillins  Home Medications   Prior to Admission medications   Medication Sig Start Date End Date Taking? Authorizing Provider  ALPRAZolam Prudy Feeler(XANAX) 1 MG tablet Take 1 mg by mouth 2 (two) times daily as needed for anxiety.    Historical Provider, MD  aspirin 81 MG tablet Take 81 mg by mouth daily.    Historical Provider, MD  atorvastatin (LIPITOR) 20 MG tablet Take 1 tablet (20 mg total) by mouth daily at 6 PM. 07/07/14   Orpah CobbAjay Kadakia, MD  Cetirizine HCl 10 MG CAPS Take 1 capsule (10 mg total) by mouth 1 day or 1 dose. 12/04/15   Joycie PeekBenjamin Bethania Schlotzhauer, PA-C  gabapentin (NEURONTIN) 300 MG capsule Take 1 capsule by mouth 2 (two) times daily. 08/21/15  Historical Provider, MD  indomethacin (INDOCIN) 25 MG capsule Take 1 capsule (25 mg total) by mouth 3 (three) times daily with meals. 02/19/15   Danelle Berry, PA-C  metoprolol tartrate (LOPRESSOR) 12.5 mg TABS Take 12.5 mg by mouth 2 (two) times daily. 05/02/12   Orpah Cobb, MD  omeprazole (PRILOSEC) 20 MG capsule Take 20 mg by mouth 2 (two) times daily before a meal.    Historical Provider, MD  ondansetron (ZOFRAN) 4 MG tablet Take 1 tablet (4 mg total) by mouth every 8 (eight) hours as needed for nausea or vomiting. 02/19/15   Danelle Berry, PA-C  OxyCODONE HCl, Abuse Deter, (OXAYDO) 5 MG TABA Take 5 mg by mouth every 6 (six) hours as needed.  02/19/15   Danelle Berry, PA-C  tamsulosin (FLOMAX) 0.4 MG CAPS capsule Take 1 capsule by mouth daily. 08/11/15   Historical Provider, MD  tiZANidine (ZANAFLEX) 4 MG tablet Take 1 tablet by mouth daily as needed. As needed for muscle spasms 08/13/15   Historical Provider, MD  traMADol (ULTRAM) 50 MG tablet Take 1 tablet by mouth daily. 09/13/15   Historical Provider, MD  zolpidem (AMBIEN) 10 MG tablet Take 1 tablet by mouth daily as needed. As needed for sleep 09/18/15   Historical Provider, MD   BP 158/68 mmHg  Pulse 68  Temp(Src) 98.2 F (36.8 C) (Oral)  Resp 18  SpO2 100% Physical Exam  Constitutional: He is oriented to person, place, and time. He appears well-developed and well-nourished. No distress.  HENT:  Head: Normocephalic and atraumatic.  Mouth/Throat: Oropharynx is clear and moist.  Right external ear and auditory canal appear normal No mastoid tenderness No drainage  Right TM is clear  Left external ear and auditory canal appear normal No mastoid tenderness Left TM appears slightly bulging with serous effusion No cervical adenopathy    Eyes: Conjunctivae are normal.  Cardiovascular: Normal rate, regular rhythm and normal heart sounds.   Pulmonary/Chest: Effort normal and breath sounds normal.  Abdominal: Soft. He exhibits no distension. There is no tenderness.  Neurological: He is alert and oriented to person, place, and time.  Skin: Skin is warm and dry.  Psychiatric: He has a normal mood and affect.  Nursing note and vitals reviewed.  ED Course  Procedures  DIAGNOSTIC STUDIES:    Oxygen Saturation is 100% on RA, normal by my interpretation.   COORDINATION OF CARE:  4:31 PM Recommend that pt take Zertec and Benadryl along with his ear drops. Discussed treatment plan with pt at bedside and pt agreed to plan.   Filed Vitals:   12/04/15 1521  BP: 158/68  Pulse: 68  Temp: 98.2 F (36.8 C)  TempSrc: Oral  Resp: 18  SpO2: 100%    MDM  Marcus [name] is a 65  y.o. male who comes in for evaluation of bilateral otalgia. Patient reportedly diagnosed with ear infection by PCP earlier this week and given antibiotic/hydrocortisone otic drops. Here today for reevaluation. Physical exam is grossly unremarkable, mild serous effusion in the left TM. Encourage patient to continue using previously prescribed drops. We'll also initiate antihistamine therapy. No evidence of mastoiditis or other acute otic infection. Final diagnoses:  Otalgia of both ears    I personally performed the services described in this documentation, which was scribed in my presence. The recorded information has been reviewed and is accurate.    Joycie Peek, PA-C 12/04/15 1703  Mancel Bale, MD 12/05/15 [phone]

## 2015-12-04 NOTE — ED Notes (Signed)
Declined W/C at D/C and was escorted to lobby by RN. 

## 2016-02-17 IMAGING — CR DG LUMBAR SPINE COMPLETE 4+V
5 series · 5 of 5 positions shown · non-contrast
Comparison: MRI 10/18/2009.  No interval radiographs.

CLINICAL DATA: Chronic low back pain and left sided sciatica

EXAM:
LUMBAR SPINE - COMPLETE 4+ VIEW

[l-spine ap]
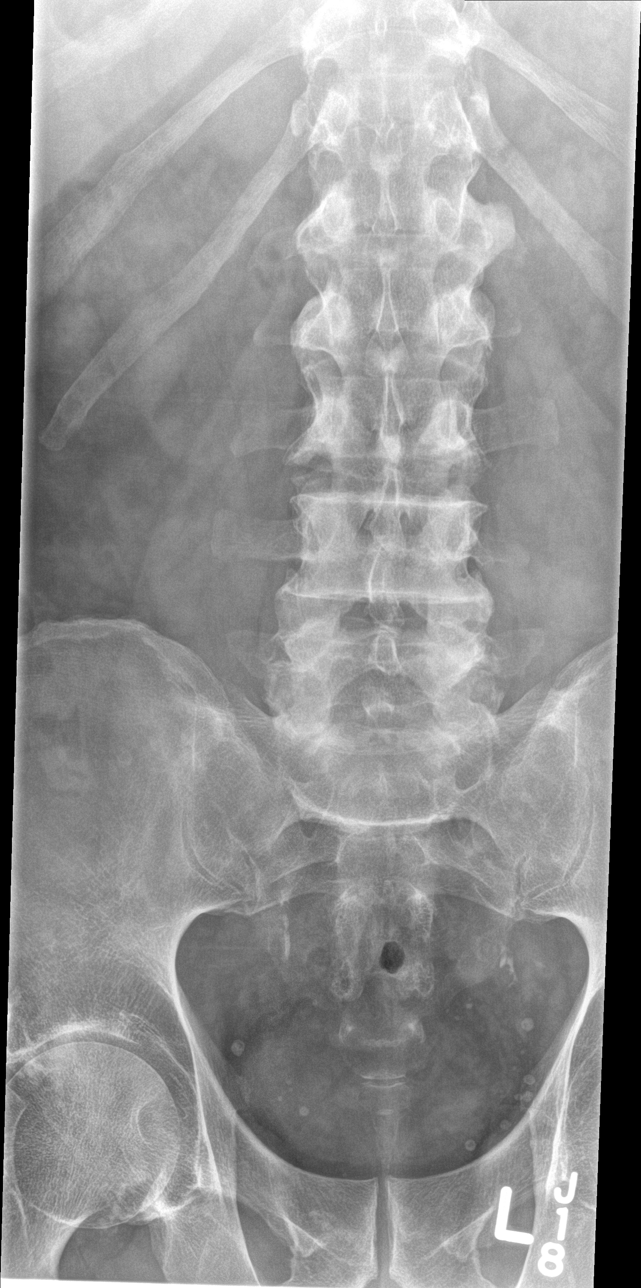

[l-spine obl (1 of 2)]
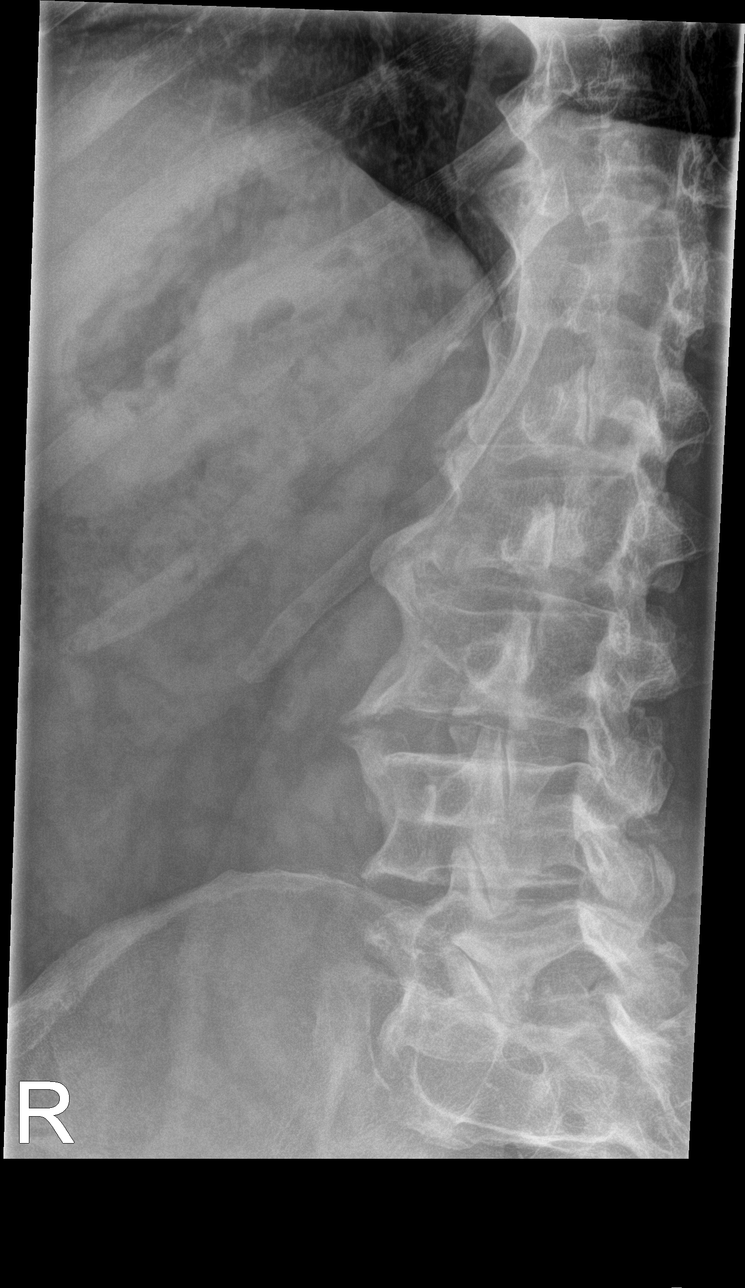

[l-spine obl (2 of 2)]
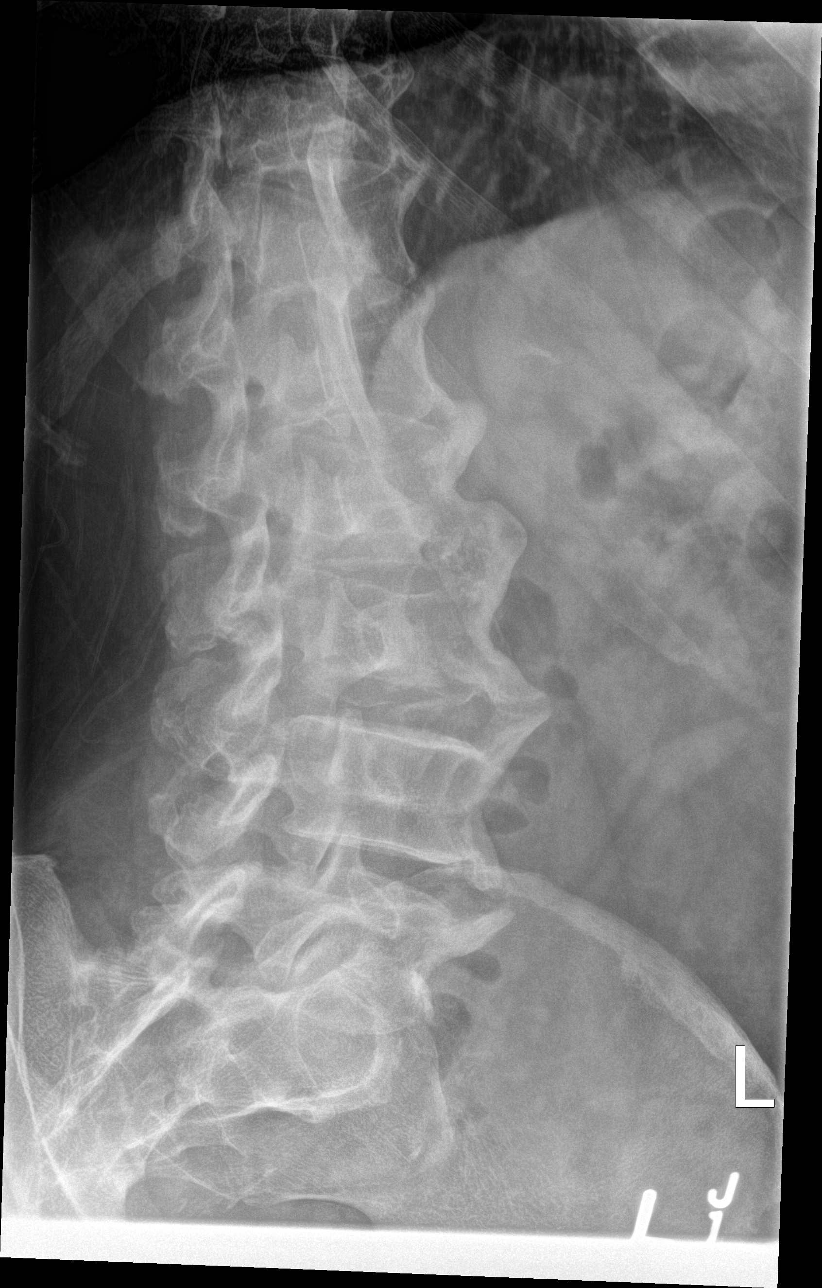

[l-spine lat]
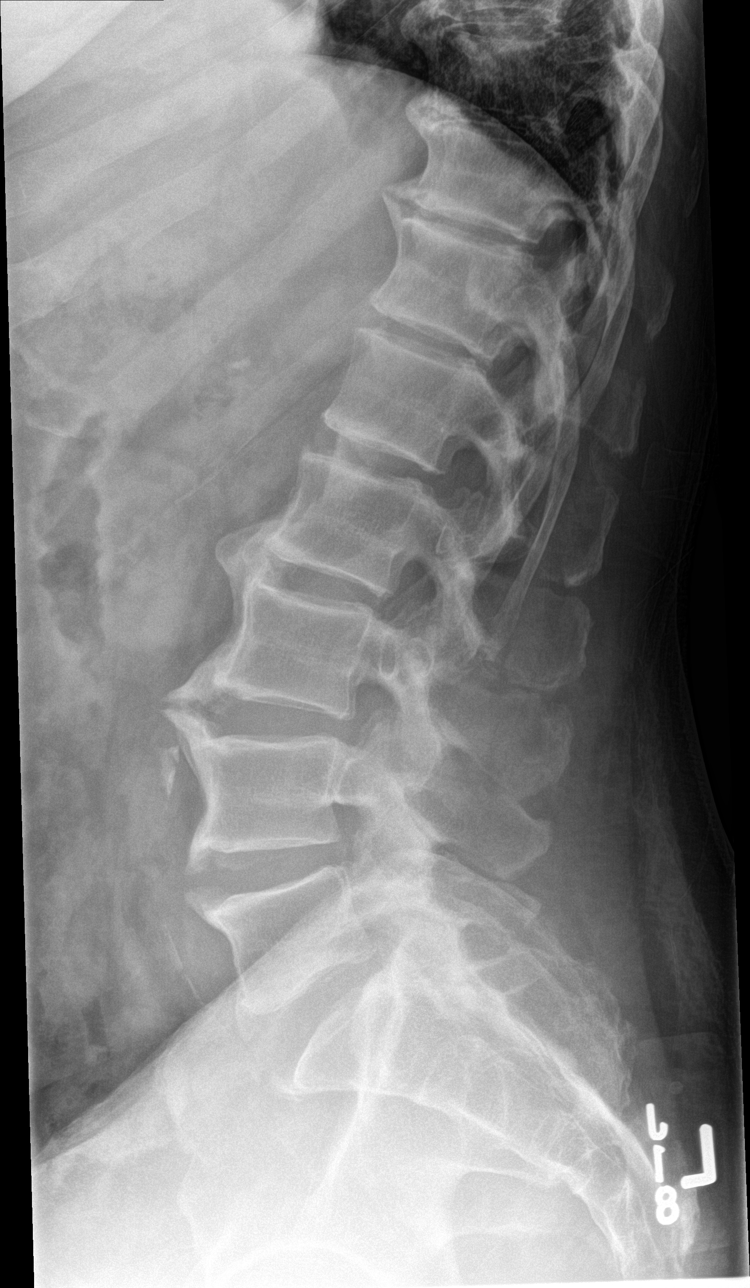

[l-spine spot]
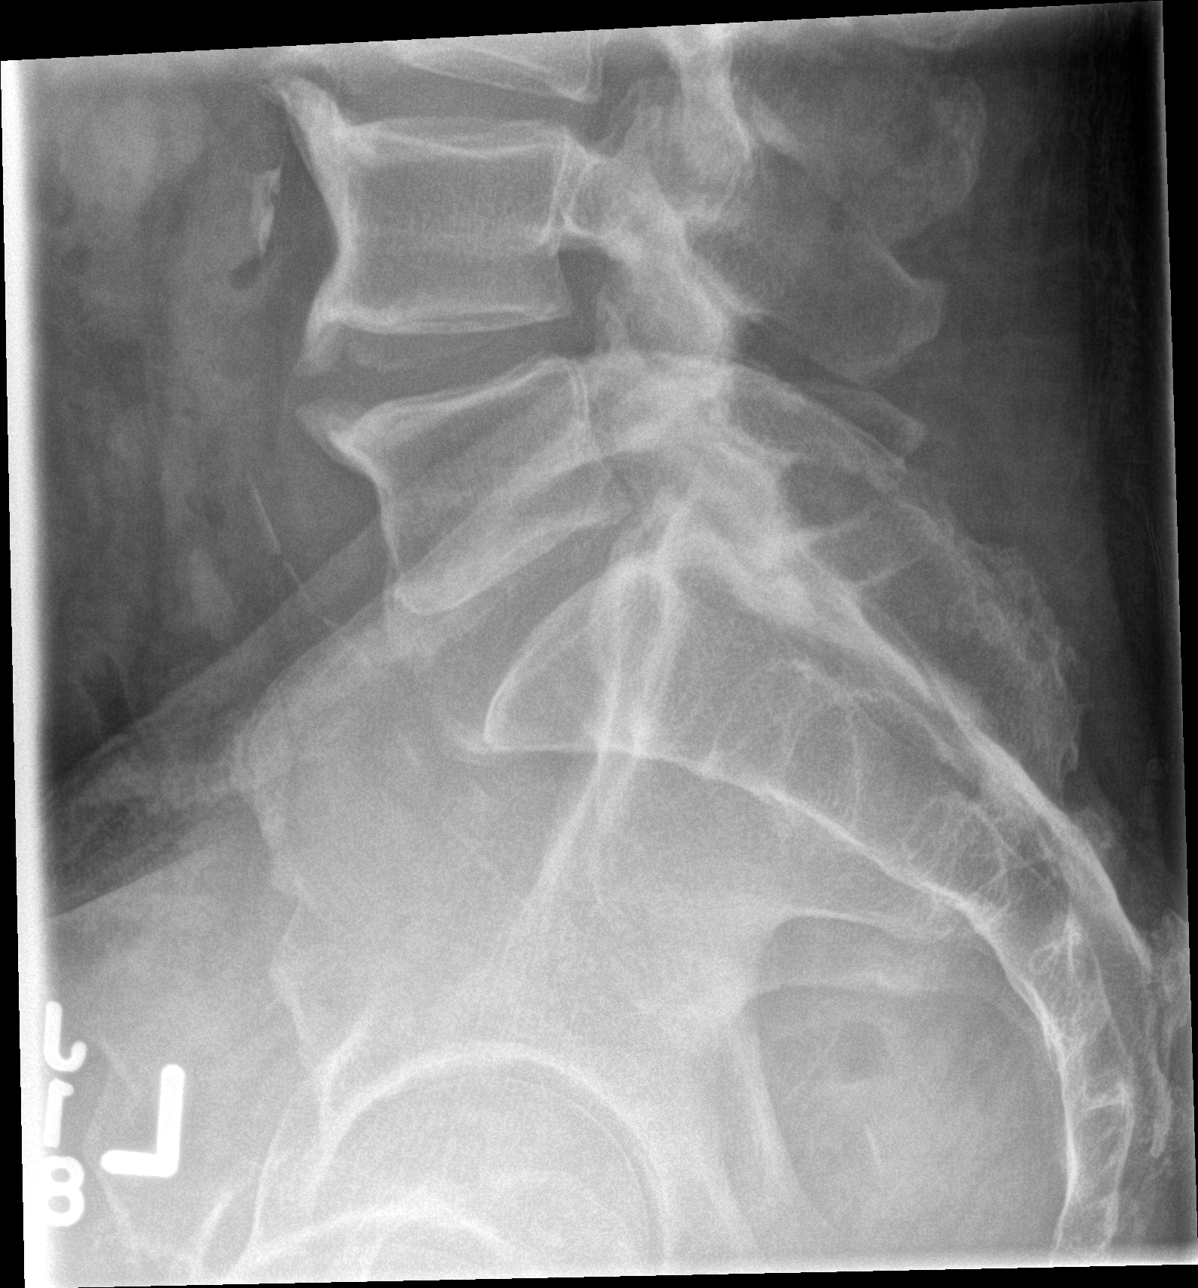

[5 of 5 positions shown; findings below may reference images not displayed]

FINDINGS: There is no evidence of lumbar spine fracture. Alignment is normal.
Intervertebral disc spaces are maintained. Mild right hip
degenerative change partly visualized mild multilevel anterior
spurring incidentally noted.
IMPRESSION: Negative.

## 2016-06-19 ENCOUNTER — Other Ambulatory Visit: Payer: Self-pay | Admitting: Sports Medicine

## 2016-06-19 DIAGNOSIS — M545 Low back pain, unspecified: Secondary | ICD-10-CM

## 2016-06-30 ENCOUNTER — Ambulatory Visit
Admission: RE | Admit: 2016-06-30 | Discharge: 2016-06-30 | Disposition: A | Discharge status: Home / Self Care | Payer: Medicare HMO | Source: Ambulatory Visit | Attending: Sports Medicine | Admitting: Sports Medicine

## 2016-06-30 DIAGNOSIS — M545 Low back pain, unspecified: Secondary | ICD-10-CM

## 2016-07-16 ENCOUNTER — Emergency Department (HOSPITAL_COMMUNITY): Payer: Medicare HMO

## 2016-07-16 ENCOUNTER — Inpatient Hospital Stay (HOSPITAL_COMMUNITY)
Admission: EM | Admit: 2016-07-16 | Discharge: 2016-07-18 | DRG: 065 | Disposition: A | Discharge status: Home / Self Care | Payer: Medicare HMO | Attending: Internal Medicine | Admitting: Internal Medicine

## 2016-07-16 ENCOUNTER — Encounter (HOSPITAL_COMMUNITY): Payer: Self-pay | Admitting: *Deleted

## 2016-07-16 DIAGNOSIS — Z8249 Family history of ischemic heart disease and other diseases of the circulatory system: Secondary | ICD-10-CM

## 2016-07-16 DIAGNOSIS — I2510 Atherosclerotic heart disease of native coronary artery without angina pectoris: Secondary | ICD-10-CM | POA: Diagnosis not present

## 2016-07-16 DIAGNOSIS — I130 Hypertensive heart and chronic kidney disease with heart failure and stage 1 through stage 4 chronic kidney disease, or unspecified chronic kidney disease: Secondary | ICD-10-CM | POA: Diagnosis present

## 2016-07-16 DIAGNOSIS — R29701 NIHSS score 1: Secondary | ICD-10-CM | POA: Diagnosis present

## 2016-07-16 DIAGNOSIS — E663 Overweight: Secondary | ICD-10-CM | POA: Diagnosis present

## 2016-07-16 DIAGNOSIS — I639 Cerebral infarction, unspecified: Principal | ICD-10-CM | POA: Diagnosis present

## 2016-07-16 DIAGNOSIS — N183 Chronic kidney disease, stage 3 unspecified: Secondary | ICD-10-CM

## 2016-07-16 DIAGNOSIS — M1990 Unspecified osteoarthritis, unspecified site: Secondary | ICD-10-CM | POA: Diagnosis present

## 2016-07-16 DIAGNOSIS — Z6827 Body mass index (BMI) 27.0-27.9, adult: Secondary | ICD-10-CM

## 2016-07-16 DIAGNOSIS — Z87891 Personal history of nicotine dependence: Secondary | ICD-10-CM

## 2016-07-16 DIAGNOSIS — Z880 Allergy status to penicillin: Secondary | ICD-10-CM

## 2016-07-16 DIAGNOSIS — R29810 Facial weakness: Secondary | ICD-10-CM | POA: Diagnosis present

## 2016-07-16 DIAGNOSIS — Z8673 Personal history of transient ischemic attack (TIA), and cerebral infarction without residual deficits: Secondary | ICD-10-CM

## 2016-07-16 DIAGNOSIS — J449 Chronic obstructive pulmonary disease, unspecified: Secondary | ICD-10-CM | POA: Diagnosis present

## 2016-07-16 DIAGNOSIS — G8191 Hemiplegia, unspecified affecting right dominant side: Secondary | ICD-10-CM | POA: Diagnosis present

## 2016-07-16 DIAGNOSIS — R079 Chest pain, unspecified: Secondary | ICD-10-CM | POA: Diagnosis present

## 2016-07-16 DIAGNOSIS — I5041 Acute combined systolic (congestive) and diastolic (congestive) heart failure: Secondary | ICD-10-CM | POA: Diagnosis not present

## 2016-07-16 DIAGNOSIS — R42 Dizziness and giddiness: Secondary | ICD-10-CM | POA: Diagnosis not present

## 2016-07-16 DIAGNOSIS — E785 Hyperlipidemia, unspecified: Secondary | ICD-10-CM

## 2016-07-16 DIAGNOSIS — Z91148 Patient's other noncompliance with medication regimen for other reason: Secondary | ICD-10-CM

## 2016-07-16 DIAGNOSIS — Z9114 Patient's other noncompliance with medication regimen: Secondary | ICD-10-CM

## 2016-07-16 DIAGNOSIS — K219 Gastro-esophageal reflux disease without esophagitis: Secondary | ICD-10-CM | POA: Diagnosis present

## 2016-07-16 DIAGNOSIS — Z7982 Long term (current) use of aspirin: Secondary | ICD-10-CM

## 2016-07-16 DIAGNOSIS — Z955 Presence of coronary angioplasty implant and graft: Secondary | ICD-10-CM

## 2016-07-16 DIAGNOSIS — I10 Essential (primary) hypertension: Secondary | ICD-10-CM | POA: Diagnosis not present

## 2016-07-16 DIAGNOSIS — Z951 Presence of aortocoronary bypass graft: Secondary | ICD-10-CM

## 2016-07-16 DIAGNOSIS — F1210 Cannabis abuse, uncomplicated: Secondary | ICD-10-CM | POA: Diagnosis present

## 2016-07-16 DIAGNOSIS — I5042 Chronic combined systolic (congestive) and diastolic (congestive) heart failure: Secondary | ICD-10-CM | POA: Diagnosis present

## 2016-07-16 DIAGNOSIS — Z7902 Long term (current) use of antithrombotics/antiplatelets: Secondary | ICD-10-CM

## 2016-07-16 LAB — CBC WITH DIFFERENTIAL/PLATELET
BASOS PCT: 0 %
Basophils Absolute: 0 10*3/uL (ref 0.0–0.1)
EOS ABS: 0.1 10*3/uL (ref 0.0–0.7)
Eosinophils Relative: 1 %
HCT: 39.3 % (ref 39.0–52.0)
Hemoglobin: 12.6 g/dL — ABNORMAL LOW (ref 13.0–17.0)
Lymphocytes Relative: 47 %
Lymphs Abs: 1.8 10*3/uL (ref 0.7–4.0)
MCH: 26.7 pg (ref 26.0–34.0)
MCHC: 32.1 g/dL (ref 30.0–36.0)
MCV: 83.3 fL (ref 78.0–100.0)
MONO ABS: 0.3 10*3/uL (ref 0.1–1.0)
MONOS PCT: 8 %
Neutro Abs: 1.8 10*3/uL (ref 1.7–7.7)
Neutrophils Relative %: 44 %
PLATELETS: 167 10*3/uL (ref 150–400)
RBC: 4.72 MIL/uL (ref 4.22–5.81)
RDW: 13.7 % (ref 11.5–15.5)
WBC: 4 10*3/uL (ref 4.0–10.5)

## 2016-07-16 LAB — I-STAT TROPONIN, ED
TROPONIN I, POC: 0.01 ng/mL (ref 0.00–0.08)
TROPONIN I, POC: 0.01 ng/mL (ref 0.00–0.08)
Troponin i, poc: 0.01 ng/mL (ref 0.00–0.08)

## 2016-07-16 LAB — COMPREHENSIVE METABOLIC PANEL
ALBUMIN: 3.4 g/dL — AB (ref 3.5–5.0)
ALT: 18 U/L (ref 17–63)
AST: 22 U/L (ref 15–41)
Alkaline Phosphatase: 106 U/L (ref 38–126)
Anion gap: 7 (ref 5–15)
BUN: 18 mg/dL (ref 6–20)
CALCIUM: 8.5 mg/dL — AB (ref 8.9–10.3)
CHLORIDE: 111 mmol/L (ref 101–111)
CO2: 24 mmol/L (ref 22–32)
CREATININE: 1.55 mg/dL — AB (ref 0.61–1.24)
GFR, EST AFRICAN AMERICAN: 52 mL/min — AB (ref >60)
GFR, EST NON AFRICAN AMERICAN: 45 mL/min — AB (ref >60)
Glucose, Bld: 116 mg/dL — ABNORMAL HIGH (ref 65–99)
Potassium: 4.4 mmol/L (ref 3.5–5.1)
SODIUM: 142 mmol/L (ref 135–145)
Total Bilirubin: 1 mg/dL (ref 0.3–1.2)
Total Protein: 6.5 g/dL (ref 6.5–8.1)

## 2016-07-16 LAB — CREATININE, SERUM
Creatinine, Ser: 1.54 mg/dL — ABNORMAL HIGH (ref 0.61–1.24)
GFR, EST AFRICAN AMERICAN: 53 mL/min — AB (ref >60)
GFR, EST NON AFRICAN AMERICAN: 45 mL/min — AB (ref >60)

## 2016-07-16 LAB — CBG MONITORING, ED: Glucose-Capillary: 80 mg/dL (ref 65–99)

## 2016-07-16 LAB — TROPONIN I: Troponin I: <0.03 ng/mL (ref <0.03)

## 2016-07-16 MED ORDER — SIMVASTATIN 20 MG PO TABS
20.0000 mg | ORAL_TABLET | Freq: Every day | ORAL | Status: DC
  Administered 2016-07-16: 20 mg via ORAL
  Filled 2016-07-16: qty 1

## 2016-07-16 MED ORDER — DIAZEPAM 5 MG/ML IJ SOLN
5.0000 mg | Freq: Once | INTRAMUSCULAR | Status: AC
  Administered 2016-07-16: 5 mg via INTRAVENOUS
  Filled 2016-07-16: qty 2

## 2016-07-16 MED ORDER — ASPIRIN 81 MG PO CHEW
324.0000 mg | CHEWABLE_TABLET | Freq: Once | ORAL | Status: AC
  Administered 2016-07-16: 324 mg via ORAL
  Filled 2016-07-16: qty 4

## 2016-07-16 MED ORDER — STROKE: EARLY STAGES OF RECOVERY BOOK
Freq: Once | Status: DC
  Filled 2016-07-16: qty 1

## 2016-07-16 MED ORDER — TAMSULOSIN HCL 0.4 MG PO CAPS
0.4000 mg | ORAL_CAPSULE | Freq: Every day | ORAL | Status: DC
  Administered 2016-07-16 – 2016-07-17 (×2): 0.4 mg via ORAL
  Filled 2016-07-16 (×2): qty 1

## 2016-07-16 MED ORDER — ASPIRIN EC 81 MG PO TBEC
81.0000 mg | DELAYED_RELEASE_TABLET | Freq: Every day | ORAL | Status: DC
  Administered 2016-07-16: 81 mg via ORAL
  Filled 2016-07-16: qty 1

## 2016-07-16 MED ORDER — TRAMADOL HCL 50 MG PO TABS: 50.0000 mg | ORAL_TABLET | Freq: Three times a day (TID) | ORAL | Status: DC | PRN

## 2016-07-16 MED ORDER — SENNOSIDES-DOCUSATE SODIUM 8.6-50 MG PO TABS: 1.0000 | ORAL_TABLET | Freq: Every evening | ORAL | Status: DC | PRN

## 2016-07-16 MED ORDER — ENOXAPARIN SODIUM 40 MG/0.4ML ~~LOC~~ SOLN
40.0000 mg | SUBCUTANEOUS | Status: DC
  Administered 2016-07-17: 40 mg via SUBCUTANEOUS
  Filled 2016-07-16: qty 0.4

## 2016-07-16 MED ORDER — CLOPIDOGREL BISULFATE 75 MG PO TABS
75.0000 mg | ORAL_TABLET | Freq: Every day | ORAL | Status: DC
  Administered 2016-07-16 – 2016-07-17 (×2): 75 mg via ORAL
  Filled 2016-07-16 (×2): qty 1

## 2016-07-16 MED ORDER — PANTOPRAZOLE SODIUM 40 MG PO TBEC
40.0000 mg | DELAYED_RELEASE_TABLET | Freq: Every day | ORAL | Status: DC
  Administered 2016-07-17 – 2016-07-18 (×2): 40 mg via ORAL
  Filled 2016-07-16 (×2): qty 1

## 2016-07-16 MED ORDER — ZOLPIDEM TARTRATE 5 MG PO TABS
5.0000 mg | ORAL_TABLET | Freq: Every evening | ORAL | Status: DC | PRN
  Administered 2016-07-16 – 2016-07-17 (×2): 5 mg via ORAL
  Filled 2016-07-16 (×2): qty 1

## 2016-07-16 MED ORDER — GABAPENTIN 300 MG PO CAPS
300.0000 mg | ORAL_CAPSULE | Freq: Two times a day (BID) | ORAL | Status: DC
  Administered 2016-07-16 – 2016-07-18 (×4): 300 mg via ORAL
  Filled 2016-07-16 (×4): qty 1

## 2016-07-16 NOTE — Progress Notes (Addendum)
Patient arrived via carelink [id number]; [facility] [place] [facility]. Vitals stable, tele applied and verified.  Pt oriented to room/unit. Questions answered. Admitting paged to make aware of patient's arrival. Continue to monitor.

## 2016-07-16 NOTE — Consult Note (Signed)
NEURO HOSPITALIST CONSULT NOTE   Requestig physician: Dr. Butler Denmark  Reason for Consult: Left pontine stroke.   History obtained from:  Patient and Chart     HPI:                                                                                                                                          Marcus [name] is an 65 y.o. male who presented to [facility] with right sided weakness x 1 week. MRI brain revealed a subacute left pontine stroke. Also noted on MRI was a chronic central pontine lacune, a remote tiny right thalamic infarct and mild global atrophy without hydrocephalus. Other symptoms included dizziness and sensation of being off balance while ambulating. Symptoms began one week ago. His PMHx includes CAD s/p stent, CABG, CHF, HTN, COPD and CKD3. He has no prior history of stroke.   The patient denies speech difficulty or paresthesias. He is partially noncompliant with his medications.    Past Medical History:  Diagnosis Date  . Arthritis   . Cervical spine disease   . CHF (congestive heart failure) (HCC)   . Depression   . GERD (gastroesophageal reflux disease)   . Hyperlipidemia   . Hypertension     Past Surgical History:  Procedure Laterality Date  . breast lump removal    . LEFT HEART CATHETERIZATION WITH CORONARY/GRAFT ANGIOGRAM Right 05/01/2012   Procedure: LEFT HEART CATHETERIZATION WITH Isabel Caprice;  Surgeon: Ricki Rodriguez, MD;  Location: [facility] CATH [facility];  Service: Cardiovascular;  Laterality: Right;  . triple bypass      Family History  Problem Relation Age of Onset  . Kidney disease Mother   . Asthma Mother   . Hypertension Father    Social History:  reports that he has quit smoking. He has never used smokeless tobacco. He reports that he drinks alcohol. He reports that he uses drugs, including Marijuana.  Allergies  Allergen Reactions  . Penicillins Hives and Other (See Comments)    Has patient had a PCN reaction causing  immediate rash, facial/tongue/throat swelling, SOB or lightheadedness with hypotension: No Has patient had a PCN reaction causing severe rash involving mucus membranes or skin necrosis: No Has patient had a PCN reaction that required hospitalization No Has patient had a PCN reaction occurring within the last 10 years: No If all of the above answers are "NO", then may proceed with Cephalosporin use.    MEDICATIONS:  Current Facility-Administered Medications:  .   stroke: mapping our early stages of recovery book, , Does not apply, Once, Calvert Cantor, MD .  aspirin EC tablet 81 mg, 81 mg, Oral, QHS, Calvert Cantor, MD, 81 mg at 07/16/16 2309 .  clopidogrel (PLAVIX) tablet 75 mg, 75 mg, Oral, QHS, Calvert Cantor, MD, 75 mg at 07/16/16 2309 .  enoxaparin (LOVENOX) injection 40 mg, 40 mg, Subcutaneous, Q24H, Saima Rizwan, MD .  gabapentin (NEURONTIN) capsule 300 mg, 300 mg, Oral, BID, Calvert Cantor, MD, 300 mg at 07/16/16 2309 .  pantoprazole (PROTONIX) EC tablet 40 mg, 40 mg, Oral, Daily, Saima Rizwan, MD .  senna-docusate (Senokot-S) tablet 1 tablet, 1 tablet, Oral, QHS PRN, Calvert Cantor, MD .  simvastatin (ZOCOR) tablet 20 mg, 20 mg, Oral, QHS, Calvert Cantor, MD, 20 mg at 07/16/16 2309 .  tamsulosin (FLOMAX) capsule 0.4 mg, 0.4 mg, Oral, QHS, Calvert Cantor, MD, 0.4 mg at 07/16/16 2309 .  traMADol (ULTRAM) tablet 50-100 mg, 50-100 mg, Oral, TID PRN, Calvert Cantor, MD .  zolpidem (AMBIEN) tablet 5 mg, 5 mg, Oral, QHS PRN, Calvert Cantor, MD, 5 mg at 07/16/16 2320   ROS:                                                                                                                                       History obtained from patient. ROS as per HPI.   Blood pressure 139/71, pulse (!) 55, temperature 98 F (36.7 C), temperature source Oral, resp. rate 20, height 5\' 8"  (1.727 m), weight 81.1  kg (178 lb 12.7 oz), SpO2 98 %.   Neurologic Examination:                                                                                                      HEENT-  Normocephalic.  Normal external eye and conjunctiva.   Lungs- Respirations unlabored. No gross wheezes.  Extremities- Warm and well-perfused.   Neurological Examination Mental Status: Alert, oriented, thought content appropriate.  Speech fluent without evidence of aphasia.  Able to follow all commands without difficulty. Cranial Nerves: II: Visual fields intact, pupils equal, round, reactive to light  III,IV, VI: ptosis not present, extra-ocular motions intact bilaterally V,VII: Mild right facial droop. Decreased temperature sensation to right face.  VIII: hearing intact to conversation IX,X: No hoarseness noted.  XI: Symmetric. XII: midline tongue extension Motor: Right : Upper extremity   5/5    Left:     Upper extremity  5/5  Lower extremity   5/5     Lower extremity   5/5 Normal tone throughout; no atrophy noted Sensory: Temperature and light touch intact throughout, bilaterally Deep Tendon Reflexes: 2+ and symmetric throughout Plantars: Right: downgoing   Left: downgoing Cerebellar: Dystaxia with FNF on right. Dystaxia with right heel-shin.  Gait: Deferred.  Lab Results: Basic Metabolic Panel:  Recent Labs Lab 07/16/16 1149 07/16/16 1715  NA 142  --   K 4.4  --   CL 111  --   CO2 24  --   GLUCOSE 116*  --   BUN 18  --   CREATININE 1.55* 1.54*  CALCIUM 8.5*  --     Liver Function Tests:  Recent Labs Lab 07/16/16 1149  AST 22  ALT 18  ALKPHOS 106  BILITOT 1.0  PROT 6.5  ALBUMIN 3.4*   No results for input(s): LIPASE, AMYLASE in the last 168 hours. No results for input(s): AMMONIA in the last 168 hours.  CBC:  Recent Labs Lab 07/16/16 1149  WBC 4.0  NEUTROABS 1.8  HGB 12.6*  HCT 39.3  MCV 83.3  PLT 167    Cardiac Enzymes:  Recent Labs Lab 07/16/16 1715  TROPONINI <0.03     Lipid Panel: No results for input(s): CHOL, TRIG, HDL, CHOLHDL, VLDL, LDLCALC in the last 168 hours.  CBG:  Recent Labs Lab 07/16/16 1057  GLUCAP 80    Microbiology: Results for orders placed or performed during the hospital encounter of 04/29/12  Surgical pcr screen     Status: None   Collection Time: 05/01/12 12:31 AM  Result Value Ref Range Status   MRSA, PCR NEGATIVE NEGATIVE Final   Staphylococcus aureus NEGATIVE NEGATIVE Final    Comment:        The Xpert SA Assay (FDA approved for NASAL specimens in patients over 66 years of age), is one component of a comprehensive surveillance program.  Test performance has been validated by [facility] for patients greater than or equal to 66 year old. It is not intended to diagnose infection nor to guide or monitor treatment.    Coagulation Studies: No results for input(s): LABPROT, INR in the last 72 hours.  Imaging: Ct Head Wo Contrast  Result Date: 07/16/2016 CLINICAL DATA:  Right arm and leg weakness, unsteady gait and altered mental status since 07/13/2016. EXAM: CT HEAD WITHOUT CONTRAST TECHNIQUE: Contiguous axial images were obtained from the base of the skull through the vertex without intravenous contrast. COMPARISON:  Head CT scan 08/11/2009. FINDINGS: Brain: No acute abnormality including hemorrhage, infarct, mass lesion, mass effect, midline shift or abnormal extra-axial fluid collection is identified. No hydrocephalus or pneumocephalus. Vascular: Atherosclerotic vascular disease is noted. Skull: Intact Sinuses/Orbits: Unremarkable. Other: None. IMPRESSION: No acute abnormality. Atherosclerosis. Electronically Signed   By: Drusilla Kannerhomas  Dalessio M.D.   On: 07/16/2016 12:28   Mr Brain Wo Contrast  Result Date: 07/16/2016 CLINICAL DATA:  65 year old male with right arm and leg weakness. Un steady gait and altered mental status since 07/13/2016. Subsequent encounter. EXAM: MRI HEAD WITHOUT CONTRAST TECHNIQUE:  Multiplanar, multiecho pulse sequences of the brain and surrounding structures were obtained without intravenous contrast. COMPARISON:  07/16/2016 head CT. FINDINGS: Brain: Acute nonhemorrhagic left pontine infarct. Remote small right paracentral pontine infarct. Remote tiny right thalamic infarct. No intracranial hemorrhage. No intracranial mass lesion noted on this unenhanced exam. Mild global atrophy without hydrocephalus. 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 Vascular: Major intracranial vascular structures are patent. Skull  and upper cervical spine: Negative. Sinuses/Orbits: No acute orbital abnormality. Minimal mucosal thickening ethmoid sinus air cells. Other: Negative. IMPRESSION: Acute nonhemorrhagic left pontine infarct. Remote small right paracentral pontine infarct. Remote tiny right thalamic infarct. Mild global atrophy without hydrocephalus. 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 Electronically Signed   By: Lacy DuverneySteven  Olson M.D.   On: 07/16/2016 15:17    Assessment: 1. Subacute left pontine ischemic infarction.  2. Chronic central pontine lacunar infarction.  3. Chronic right thalamic infarction.  4. Multiple comorbidities and stroke risk factors.  5. Partial compliance with home medications.   Recommendations: 1. Continue ASA, Plavix and simvastatin.  2. BP management.  3. PT/OT/Speech.  4. MRA of head.  5. Agree with obtaining carotid ultrasound and echocardiogram.   Electronically signed: Dr. Caryl PinaEric Kaylean Tupou 07/16/2016, 10:58 PM

## 2016-07-16 NOTE — H&P (Signed)
History and Physical    Marcus [name] ZOX:[id number] DOB: [birth year] DOA: 07/16/2016    PCP: Dorrene German, MD  Patient coming from: home  Chief Complaint: dizziness  HPI: Marcus [name] is a 65 y.o. male with medical history significant of HTN, CAD with stent to Lcirx, CABG, COPD, CKD 3, who presents to the ER with dizziness, being off balance and weakness in right leg and right arm for 1 wk. He states that when he walks, he feels very unsteady and feels that his right leg and arm have been weak. He usually walks with a cane. He has not had any numbness or tingling or visual and speech changes.  When asked about his medications he admits that he does not take pills regularly and misses them about 2-3 x wk. No h/o prior CVAs/ TIAs.   ED Course: MRI show nonhemorrhagic left pontine infarct and a remote small right paracentral pontine infract and right thalamic infarct, partially empty non expanded sella with small pituitary.   Review of Systems:  Right leg sore near his ankle since the weekend. No injury to it as far as he can recall.  Has anxiety. Stays stressed out from taking care of his father.  All other systems reviewed and apart from HPI, are negative.  Past Medical History:  Diagnosis Date  . Arthritis   . Cervical spine disease   . CHF (congestive heart failure) (HCC)   . Depression   . GERD (gastroesophageal reflux disease)   . Hyperlipidemia   . Hypertension     Past Surgical History:  Procedure Laterality Date  . breast lump removal    . LEFT HEART CATHETERIZATION WITH CORONARY/GRAFT ANGIOGRAM Right 05/01/2012   Procedure: LEFT HEART CATHETERIZATION WITH Isabel Caprice;  Surgeon: Ricki Rodriguez, MD;  Location: [facility] CATH LAB;  Service: Cardiovascular;  Laterality: Right;  . triple bypass      Social History:   reports that he has quit smoking. He has never used smokeless tobacco. He reports that he drinks alcohol.  He reports that he uses Marijuana  ocassionally.  Takes care of and lives with father. Uses a Cane. Occassional alcohol use.   Allergies  Allergen Reactions  . Penicillins Hives and Other (See Comments)    Has patient had a PCN reaction causing immediate rash, facial/tongue/throat swelling, SOB or lightheadedness with hypotension: No Has patient had a PCN reaction causing severe rash involving mucus membranes or skin necrosis: No Has patient had a PCN reaction that required hospitalization No Has patient had a PCN reaction occurring within the last 10 years: No If all of the above answers are "NO", then may proceed with Cephalosporin use.    Family History  Problem Relation Age of Onset  . Kidney disease Mother   . Asthma Mother   . Hypertension Father      Prior to Admission medications   Medication Sig Start Date End Date Taking? Authorizing Provider  aspirin EC 81 MG tablet Take 81 mg by mouth at bedtime.   Yes Historical Provider, MD  cetirizine (ZYRTEC) 10 MG tablet Take 10 mg by mouth at bedtime.   Yes Historical Provider, MD  clopidogrel (PLAVIX) 75 MG tablet Take 75 mg by mouth at bedtime.   Yes Historical Provider, MD  gabapentin (NEURONTIN) 300 MG capsule Take 300 mg by mouth 2 (two) times daily.    Yes Historical Provider, MD  indomethacin (INDOCIN) 25 MG capsule Take 25 mg by mouth 3 (three) times daily  as needed for mild pain or moderate pain.   Yes Historical Provider, MD  lisinopril (PRINIVIL,ZESTRIL) 2.5 MG tablet Take 2.5 mg by mouth at bedtime.   Yes Historical Provider, MD  metoprolol tartrate (LOPRESSOR) 12.5 mg TABS Take 12.5 mg by mouth 2 (two) times daily.   Yes Orpah CobbAjay Kadakia, MD  omeprazole (PRILOSEC) 20 MG capsule Take 20 mg by mouth at bedtime.    Yes Historical Provider, MD  ondansetron (ZOFRAN) 4 MG tablet Take 1 tablet (4 mg total) by mouth every 8 (eight) hours as needed for nausea or vomiting. 02/19/15  Yes Danelle BerryLeisa Tapia, PA-C  simvastatin (ZOCOR) 20 MG tablet Take 20 mg by mouth at bedtime.    Yes Historical Provider, MD  tamsulosin (FLOMAX) 0.4 MG CAPS capsule Take 0.4 mg by mouth at bedtime.    Yes Historical Provider, MD  tiZANidine (ZANAFLEX) 4 MG tablet Take 4 mg by mouth every 8 (eight) hours as needed for muscle spasms.    Yes Historical Provider, MD  traMADol (ULTRAM) 50 MG tablet Take 50-100 tablets by mouth 3 (three) times daily as needed for moderate pain.    Yes Historical Provider, MD  zolpidem (AMBIEN) 10 MG tablet Take 10 mg by mouth at bedtime as needed for sleep.    Yes Historical Provider, MD    Physical Exam: Vitals:   07/16/16 1200 07/16/16 1516 07/16/16 1559 07/16/16 1604  BP: 123/82 136/72  143/72  Pulse: 72 73  74  Resp: (!) 27 20  22   Temp:   97.4 F (36.3 C)   SpO2: 98% 99%  99%      Constitutional: NAD, calm, comfortable Eyes: PERTLA, lids and conjunctivae normal ENMT: Mucous membranes are moist. Posterior pharynx clear of any exudate or lesions. Normal dentition.  Neck: normal, supple, no masses, no thyromegaly Respiratory: clear to auscultation bilaterally, no wheezing, no crackles. Normal respiratory effort. No accessory muscle use.  Cardiovascular: S1 & S2 heard, regular rate and rhythm, no murmurs / rubs / gallops. No extremity edema. 2+ pedal pulses. No carotid bruits.  Abdomen: No distension, no tenderness, no masses palpated. No hepatosplenomegaly. Bowel sounds normal.  Musculoskeletal: no clubbing / cyanosis. No joint deformity upper and lower extremities. Good ROM, no contractures. Normal muscle tone. Tender in medial right lower leg just above the ankle with mild edema.  Skin: no rashes, lesions, ulcers. No induration Neurologic: CN 2-12 grossly intact. Sensation intact, DTR normal. Strength 5/5 in all 4 limbs.  Psychiatric: Normal judgment and insight. Alert and oriented x 3. Normal mood.     Labs on Admission: I have personally reviewed following labs and imaging studies  CBC:  Recent Labs Lab 07/16/16 1149  WBC 4.0    NEUTROABS 1.8  HGB 12.6*  HCT 39.3  MCV 83.3  PLT 167   Basic Metabolic Panel:  Recent Labs Lab 07/16/16 1149  NA 142  K 4.4  CL 111  CO2 24  GLUCOSE 116*  BUN 18  CREATININE 1.55*  CALCIUM 8.5*   GFR: CrCl cannot be calculated (Unknown ideal weight.). Liver Function Tests:  Recent Labs Lab 07/16/16 1149  AST 22  ALT 18  ALKPHOS 106  BILITOT 1.0  PROT 6.5  ALBUMIN 3.4*   No results for input(s): LIPASE, AMYLASE in the last 168 hours. No results for input(s): AMMONIA in the last 168 hours. Coagulation Profile: No results for input(s): INR, PROTIME in the last 168 hours. Cardiac Enzymes: No results for input(s): CKTOTAL, CKMB, CKMBINDEX, TROPONINI in the  last 168 hours. BNP (last 3 results) No results for input(s): PROBNP in the last 8760 hours. HbA1C: No results for input(s): HGBA1C in the last 72 hours. CBG:  Recent Labs Lab 07/16/16 1057  GLUCAP 80   Lipid Profile: No results for input(s): CHOL, HDL, LDLCALC, TRIG, CHOLHDL, LDLDIRECT in the last 72 hours. Thyroid Function Tests: No results for input(s): TSH, T4TOTAL, FREET4, T3FREE, THYROIDAB in the last 72 hours. Anemia Panel: No results for input(s): VITAMINB12, FOLATE, FERRITIN, TIBC, IRON, RETICCTPCT in the last 72 hours. Urine analysis:    Component Value Date/Time   COLORURINE YELLOW 09/12/2009 1111   APPEARANCEUR CLEAR 09/12/2009 1111   LABSPEC 1.020 09/12/2009 1111   PHURINE 7.0 09/12/2009 1111   GLUCOSEU NEGATIVE 09/12/2009 1111   HGBUR NEGATIVE 09/12/2009 1111   BILIRUBINUR NEGATIVE 09/12/2009 1111   KETONESUR NEGATIVE 09/12/2009 1111   PROTEINUR NEGATIVE 09/12/2009 1111   UROBILINOGEN 0.2 09/12/2009 1111   NITRITE NEGATIVE 09/12/2009 1111   LEUKOCYTESUR  09/12/2009 1111    NEGATIVE MICROSCOPIC NOT DONE ON URINES WITH NEGATIVE PROTEIN, BLOOD, LEUKOCYTES, NITRITE, OR GLUCOSE <1000 mg/dL.   Sepsis Labs: @LABRCNTIP (procalcitonin:4,lacticidven:4) )No results found for this or any  previous visit (from the past 240 hour(s)).   Radiological Exams on Admission: Ct Head Wo Contrast  Result Date: 07/16/2016 CLINICAL DATA:  Right arm and leg weakness, unsteady gait and altered mental status since 07/13/2016. EXAM: CT HEAD WITHOUT CONTRAST TECHNIQUE: Contiguous axial images were obtained from the base of the skull through the vertex without intravenous contrast. COMPARISON:  Head CT scan 08/11/2009. FINDINGS: Brain: No acute abnormality including hemorrhage, infarct, mass lesion, mass effect, midline shift or abnormal extra-axial fluid collection is identified. No hydrocephalus or pneumocephalus. Vascular: Atherosclerotic vascular disease is noted. Skull: Intact Sinuses/Orbits: Unremarkable. Other: None. IMPRESSION: No acute abnormality. Atherosclerosis. Electronically Signed   By: Drusilla Kanner M.D.   On: 07/16/2016 12:28   Mr Brain Wo Contrast  Result Date: 07/16/2016 CLINICAL DATA:  65 year old male with right arm and leg weakness. Un steady gait and altered mental status since 07/13/2016. Subsequent encounter. EXAM: MRI HEAD WITHOUT CONTRAST TECHNIQUE: Multiplanar, multiecho pulse sequences of the brain and surrounding structures were obtained without intravenous contrast. COMPARISON:  07/16/2016 head CT. FINDINGS: Brain: Acute nonhemorrhagic left pontine infarct. Remote small right paracentral pontine infarct. Remote tiny right thalamic infarct. No intracranial hemorrhage. No intracranial mass lesion noted on this unenhanced exam. Mild global atrophy without hydrocephalus. 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 Vascular: Major intracranial vascular structures are patent. Skull and upper cervical spine: Negative. Sinuses/Orbits: No acute orbital abnormality. Minimal mucosal thickening ethmoid sinus air cells. Other: Negative. IMPRESSION: Acute nonhemorrhagic left pontine infarct. Remote small right paracentral pontine infarct. Remote tiny right thalamic  infarct. Mild global atrophy without hydrocephalus. 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 Electronically Signed   By: Lacy Duverney M.D.   On: 07/16/2016 15:17    EKG: Independently reviewed. Sinus rhythm at 64 bpm with T wave inversions in 2,3,aVf, V3- V 6  Assessment/Plan Principal Problem:   Acute cerebral infarction  - resume ASA/Plavix - have ordered ECHO, Carotid duplex, lipid panel, Hba1c - PT/ OT eval - hold antihypertensives today  Active Problems:    CAD (coronary artery disease) - s/p Stent and then CABG - ASA/Plavix- resume Metoprolol once acute CVA resolved  Abnormal EKG - inverted T waves in anterior leads are new - admits to occasional mild pain in left chest which he thinks is arthritis -  troponin negative - repeat EKG in AM- I have contacted Dr Algie CofferKadakia who will see him  CKD 3 - stable   Chronic combined systolic and diastolic CHF, NYHA class 3 (HCC) - EF 45-50% and grade 1dd in 2015 - holding ACE I and BB for now - compensated    Benign essential HTN - as above, holding antihypertensives today    HLD (hyperlipidemia) - statin    Non compliance w medication regimen  -advised compliance  Marijuana abuse - advised to d/c    DVT prophylaxis: Lovenox  Code Status: Full code  Family Communication:   [facility]  Consults called: Neuro called by ER- spoke with Dr Amada JupiterKirkpatrick  Admission status: observation    [facility] G[facility]ology Endoscopy CenterRIZWAN,Rajinder Mesick MD [facility] Pager: [email or web address] Password [email or web address] [phone], please contact night-coverage   07/16/2016, 4:52 PM
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
## 2016-07-16 NOTE — ED Provider Notes (Signed)
[facility]-EMERGENCY DEPT Provider Note   CSN: [id number] Arrival date & time: 07/16/16  1048     History   Chief Complaint Chief Complaint  Patient presents with  . Dizziness    HPI Marcus [name] is a 65 y.o. male.  HPI13 year old nail with past medical history of hypertension, hyperlipidemia, CHF who presents with a one-week history of gait difficulty. The patient states over the last week, he has felt generally unstable on his feet and is having difficulty walking due to leading to both sides. He states he feels as though he has been drinking despite not drinking.Denies any sensation of vertigo but does endorse intermittent nausea. He has also noticed mild weakness of his right arm. The symptoms began a week ago. He has a history of vertigo but states he was never formally diagnosed or worked up. Denies any recent head trauma. Of note, patient also endorses intermittent, sharp, transient, left-sided chest pain. He has not seen his doctor for this. Denies any specific alleviating or aggravating factors.  Past Medical History:  Diagnosis Date  . Arthritis   . Cervical spine disease   . CHF (congestive heart failure) (HCC)   . Depression   . GERD (gastroesophageal reflux disease)   . Hyperlipidemia   . Hypertension     Patient Active Problem List   Diagnosis Date Noted  . Chest pain at rest 07/06/2014  . Congestive heart failure (CHF) (HCC) 03/11/2011  . Hypertension 03/11/2011  . Coronary artery disease 03/11/2011    Past Surgical History:  Procedure Laterality Date  . breast lump removal    . LEFT HEART CATHETERIZATION WITH CORONARY/GRAFT ANGIOGRAM Right 05/01/2012   Procedure: LEFT HEART CATHETERIZATION WITH Isabel Caprice;  Surgeon: Ricki Rodriguez, MD;  Location: [facility]ATH [facility];  Service: Cardiovascular;  Laterality: Right;  . triple bypass         Home Medications    Prior to Admission medications   Medication Sig Start Date End Date Taking? Authorizing  Provider  aspirin EC 81 MG tablet Take 81 mg by mouth at bedtime.   Yes Historical Provider, MD  cetirizine (ZYRTEC) 10 MG tablet Take 10 mg by mouth at bedtime.   Yes Historical Provider, MD  clopidogrel (PLAVIX) 75 MG tablet Take 75 mg by mouth at bedtime.   Yes Historical Provider, MD  gabapentin (NEURONTIN) 300 MG capsule Take 300 mg by mouth 2 (two) times daily.    Yes Historical Provider, MD  indomethacin (INDOCIN) 25 MG capsule Take 25 mg by mouth 3 (three) times daily as needed for mild pain or moderate pain.   Yes Historical Provider, MD  lisinopril (PRINIVIL,ZESTRIL) 2.5 MG tablet Take 2.5 mg by mouth at bedtime.   Yes Historical Provider, MD  metoprolol tartrate (LOPRESSOR) 12.5 mg TABS Take 12.5 mg by mouth 2 (two) times daily.   Yes Orpah Cobb, MD  omeprazole (PRILOSEC) 20 MG capsule Take 20 mg by mouth at bedtime.    Yes Historical Provider, MD  ondansetron (ZOFRAN) 4 MG tablet Take 1 tablet (4 mg total) by mouth every 8 (eight) hours as needed for nausea or vomiting. 02/19/15  Yes Danelle Berry, PA-C  simvastatin (ZOCOR) 20 MG tablet Take 20 mg by mouth at bedtime.   Yes Historical Provider, MD  tamsulosin (FLOMAX) 0.4 MG CAPS capsule Take 0.4 mg by mouth at bedtime.    Yes Historical Provider, MD  tiZANidine (ZANAFLEX) 4 MG tablet Take 4 mg by mouth every 8 (eight) hours as needed for  muscle spasms.    Yes Historical Provider, MD  traMADol (ULTRAM) 50 MG tablet Take 50-100 tablets by mouth 3 (three) times daily as needed for moderate pain.    Yes Historical Provider, MD  zolpidem (AMBIEN) 10 MG tablet Take 10 mg by mouth at bedtime as needed for sleep.    Yes Historical Provider, MD    Family History Family History  Problem Relation Age of Onset  . Kidney disease Mother   . Asthma Mother   . Hypertension Father     Social History Social History  Substance Use Topics  . Smoking status: Former Games developer  . Smokeless tobacco: Never Used  . Alcohol use Yes     Comment: occ      Allergies   Penicillins   Review of Systems Review of Systems  Constitutional: Positive for fatigue. Negative for chills and fever.  HENT: Negative for congestion and rhinorrhea.   Eyes: Negative for visual disturbance.  Respiratory: Negative for cough, shortness of breath and wheezing.   Cardiovascular: Negative for chest pain and leg swelling.  Gastrointestinal: Negative for abdominal pain, diarrhea, nausea and vomiting.  Genitourinary: Negative for dysuria and flank pain.  Musculoskeletal: Positive for gait problem. Negative for neck pain and neck stiffness.  Skin: Negative for rash and wound.  Allergic/Immunologic: Negative for immunocompromised state.  Neurological: Positive for weakness. Negative for syncope and headaches.  All other systems reviewed and are negative.    Physical Exam Updated Vital Signs BP 136/72   Pulse 73   Temp 97.4 F (36.3 C)   Resp 20   SpO2 99%   Physical Exam  Constitutional: He is oriented to person, place, and time. He appears well-developed and well-nourished. No distress.  HENT:  Head: Normocephalic and atraumatic.  Eyes: Conjunctivae are normal.  Neck: Neck supple.  Cardiovascular: Normal rate, regular rhythm and normal heart sounds.  Exam reveals no friction rub.   No murmur heard. Pulmonary/Chest: Effort normal and breath sounds normal. No respiratory distress. He has no wheezes. He has no rales. He exhibits tenderness (mild tenderness to palpation over left anterior chest wall).  Abdominal: He exhibits no distension.  Musculoskeletal: He exhibits no edema.  Neurological: He is alert and oriented to person, place, and time. He exhibits normal muscle tone.  Skin: Skin is warm. Capillary refill takes less than 2 seconds.  Psychiatric: He has a normal mood and affect.  Nursing note and vitals reviewed.   Neurological Exam:  Mental Status: Alert and oriented to person, place, and time. Attention and concentration normal.  Speech clear. Recent memory is intact. Cranial Nerves: Visual fields intact to confrontation in all quadrants bilaterally. EOMI and PERRLA. No nystagmus noted. Facial sensation intact at forehead, maxillary cheek, and chin/mandible bilaterally. No weakness of masticatory muscles. No facial asymmetry or weakness. Hearing grossly normal to finer rub. Uvula is midline, and palate elevates symmetrically. Normal SCM and trapezius strength. Tongue midline without fasciculations Motor: Muscle strength 5/5 in proximal and distal UE and LE bilaterally. No pronator drift. Muscle tone normal. Reflexes: 2+ and symmetrical in all four extremities.  Sensation: Intact to light touch in upper and lower extremities distally bilaterally.  Gait: broad-based and unstable Coordination: past pointing on right FTN    ED Treatments / Results  Labs (all labs ordered are listed, but only abnormal results are displayed) Labs Reviewed  CBC WITH DIFFERENTIAL/PLATELET - Abnormal; Notable for the following:       Result Value   Hemoglobin 12.6 (*)  All other components within normal limits  COMPREHENSIVE METABOLIC PANEL - Abnormal; Notable for the following:    Glucose, Bld 116 (*)    Creatinine, Ser 1.55 (*)    Calcium 8.5 (*)    Albumin 3.4 (*)    GFR calc non Af Amer 45 (*)    GFR calc Af Amer 52 (*)    All other components within normal limits  CBG MONITORING, ED  Rosezena SensorI-STAT TROPOININ, ED  I-STAT TROPOININ, ED    EKG  EKG Interpretation  Date/Time:  Tuesday July 16 2016 11:16:43 EST Ventricular Rate:  64 PR Interval:    QRS Duration: 107 QT Interval:  412 QTC Calculation: 426 R Axis:   35 Text Interpretation:  Sinus rhythm RSR' in V1 or V2, right VCD or RVH Nonspecific T abnormalities, inferior leads Since last tracing, TWI now more evident in precordial leads Confirmed by Julie-Ann Vanmaanen MD, Qiara Minetti [phone]) on 07/16/2016 12:37:04 PM       Radiology Ct Head Wo Contrast  Result Date: 07/16/2016 CLINICAL  DATA:  Right arm and leg weakness, unsteady gait and altered mental status since 07/13/2016. EXAM: CT HEAD WITHOUT CONTRAST TECHNIQUE: Contiguous axial images were obtained from the base of the skull through the vertex without intravenous contrast. COMPARISON:  Head CT scan 08/11/2009. FINDINGS: Brain: No acute abnormality including hemorrhage, infarct, mass lesion, mass effect, midline shift or abnormal extra-axial fluid collection is identified. No hydrocephalus or pneumocephalus. Vascular: Atherosclerotic vascular disease is noted. Skull: Intact Sinuses/Orbits: Unremarkable. Other: None. IMPRESSION: No acute abnormality. Atherosclerosis. Electronically Signed   By: Drusilla Kannerhomas  Dalessio M.D.   On: 07/16/2016 12:28   Mr Brain Wo Contrast  Result Date: 07/16/2016 CLINICAL DATA:  65 year old male with right arm and leg weakness. Un steady gait and altered mental status since 07/13/2016. Subsequent encounter. EXAM: MRI HEAD WITHOUT CONTRAST TECHNIQUE: Multiplanar, multiecho pulse sequences of the brain and surrounding structures were obtained without intravenous contrast. COMPARISON:  07/16/2016 head CT. FINDINGS: Brain: Acute nonhemorrhagic left pontine infarct. Remote small right paracentral pontine infarct. Remote tiny right thalamic infarct. No intracranial hemorrhage. No intracranial mass lesion noted on this unenhanced exam. Mild global atrophy without hydrocephalus. 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 Vascular: Major intracranial vascular structures are patent. Skull and upper cervical spine: Negative. Sinuses/Orbits: No acute orbital abnormality. Minimal mucosal thickening ethmoid sinus air cells. Other: Negative. IMPRESSION: Acute nonhemorrhagic left pontine infarct. Remote small right paracentral pontine infarct. Remote tiny right thalamic infarct. Mild global atrophy without hydrocephalus. 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 Electronically Signed   By:  Lacy DuverneySteven  Olson M.D.   On: 07/16/2016 15:17    Procedures Procedures (including critical care time)  Medications Ordered in ED Medications  diazepam (VALIUM) injection 5 mg (5 mg Intravenous Given 07/16/16 1154)  aspirin chewable tablet 324 mg (324 mg Oral Given 07/16/16 1602)     Initial Impression / Assessment and Plan / ED Course  I have reviewed the triage vital signs and the nursing notes.  Pertinent labs & imaging results that were available during my care of the patient were reviewed by me and considered in my medical decision making (see chart for details).  Clinical Course     65 year old male with past medical history as above who presents with gait ataxia for 1 week as well as intermittent chest pain. Regarding his chest pain, EKG does show new T-wave inversions but his pain is highly atypical, he denies any chest pain currently, and initial  troponin is negative. Will need cardiology follow-up for this. Regarding his ataxia and abnormal neurological exam, CT head is negative so MRI obtained which confirms left pontine stroke. No hemorrhage. Patient given aspirin and will admit to hospitalists Dr. Amada JupiterKirkpatrick of neurology also aware and in agreement.  Final Clinical Impressions(s) / ED Diagnoses   Final diagnoses:  Cerebrovascular accident (CVA), unspecified mechanism (HCC)  Chest pain, unspecified type    New Prescriptions New Prescriptions   No medications on file     Shaune Pollackameron Tyquasia Pant, MD 07/16/16 1945

## 2016-07-16 NOTE — Consult Note (Signed)
Referring Physician: Nolene Ebbs, MD/Saima Wynelle Cleveland, MD  Marcus [name] is an 65 y.o. male.                       Chief Complaint: Dizziness and abnormal EKG  HPI: 65 year old male with PMH of hypertension, CAD, [place] to LCX, CABG, COPD, CKD III has dizziness, imbalance and weakness in right leg and arm x 1 week. His EKG shows T wave inversion in inferior and lateral leads. MRI shows acute nonhemorrhagic left pontine infarct and remote small right paracentral pontine infarct and right thalamic infarct. He has exertional chest pain at times. Currently he is chest pain free.  Past Medical History:  Diagnosis Date  . Arthritis   . Cervical spine disease   . CHF (congestive heart failure) ([place])   . Depression   . GERD (gastroesophageal reflux disease)   . Hyperlipidemia   . Hypertension       Past Surgical History:  Procedure Laterality Date  . breast lump removal    . LEFT HEART CATHETERIZATION WITH CORONARY/GRAFT ANGIOGRAM Right 05/01/2012   Procedure: LEFT HEART CATHETERIZATION WITH Beatrix Fetters;  Surgeon: Birdie Riddle, MD;  Location: [facility];  Service: Cardiovascular;  Laterality: Right;  . triple bypass      Family History  Problem Relation Age of Onset  . Kidney disease Mother   . Asthma Mother   . Hypertension Father    Social History:  reports that he has quit smoking. He has never used smokeless tobacco. He reports that he drinks alcohol. He reports that he uses drugs, including Marijuana.  Allergies:  Allergies  Allergen Reactions  . Penicillins Hives and Other (See Comments)    Has patient had a PCN reaction causing immediate rash, facial/tongue/throat swelling, SOB or lightheadedness with hypotension: No Has patient had a PCN reaction causing severe rash involving mucus membranes or skin necrosis: No Has patient had a PCN reaction that required hospitalization No Has patient had a PCN reaction occurring within the last 10 years: No If all of the  above answers are "NO", then may proceed with Cephalosporin use.     (Not in a hospital admission)  Results for orders placed or performed during the hospital encounter of 07/16/16 (from the past 48 hour(s))  CBG monitoring, ED     Status: None   Collection Time: 07/16/16 10:57 AM  Result Value Ref Range   Glucose-Capillary 80 65 - 99 mg/dL  CBC with Differential     Status: Abnormal   Collection Time: 07/16/16 11:49 AM  Result Value Ref Range   WBC 4.0 4.0 - 10.5 K/uL   RBC 4.72 4.22 - 5.81 MIL/uL   Hemoglobin 12.6 (L) 13.0 - 17.0 g/dL   HCT 39.3 39.0 - 52.0 %   MCV 83.3 78.0 - 100.0 fL   MCH 26.7 26.0 - 34.0 pg   MCHC 32.1 30.0 - 36.0 g/dL   RDW 13.7 11.5 - 15.5 %   Platelets 167 150 - 400 K/uL   Neutrophils Relative % 44 %   Neutro Abs 1.8 1.7 - 7.7 K/uL   Lymphocytes Relative 47 %   Lymphs Abs 1.8 0.7 - 4.0 K/uL   Monocytes Relative 8 %   Monocytes Absolute 0.3 0.1 - 1.0 K/uL   Eosinophils Relative 1 %   Eosinophils Absolute 0.1 0.0 - 0.7 K/uL   Basophils Relative 0 %   Basophils Absolute 0.0 0.0 - 0.1 K/uL  Comprehensive metabolic  panel     Status: Abnormal   Collection Time: 07/16/16 11:49 AM  Result Value Ref Range   Sodium 142 135 - 145 mmol/L   Potassium 4.4 3.5 - 5.1 mmol/L   Chloride 111 101 - 111 mmol/L   CO2 24 22 - 32 mmol/L   Glucose, Bld 116 (H) 65 - 99 mg/dL   BUN 18 6 - 20 mg/dL   Creatinine, Ser 1.55 (H) 0.61 - 1.24 mg/dL   Calcium 8.5 (L) 8.9 - 10.3 mg/dL   Total Protein 6.5 6.5 - 8.1 g/dL   Albumin 3.4 (L) 3.5 - 5.0 g/dL   AST 22 15 - 41 U/L   ALT 18 17 - 63 U/L   Alkaline Phosphatase 106 38 - 126 U/L   Total Bilirubin 1.0 0.3 - 1.2 mg/dL   GFR calc non Af Amer 45 (L) >60 mL/min   GFR calc Af Amer 52 (L) >60 mL/min    Comment: (NOTE) The eGFR has been calculated using the CKD EPI equation. This calculation has not been validated in all clinical situations. eGFR's persistently <60 mL/min signify possible Chronic Kidney Disease.    Anion  gap 7 5 - 15  I-Stat Troponin, ED - 0, 3, 6 hours (not at [facility])     Status: None   Collection Time: 07/16/16 12:02 PM  Result Value Ref Range   Troponin i, poc 0.01 0.00 - 0.08 ng/mL   Comment 3            Comment: Due to the release kinetics of cTnI, a negative result within the first hours of the onset of symptoms does not rule out myocardial infarction with certainty. If myocardial infarction is still suspected, repeat the test at appropriate intervals.   I-Stat Troponin, ED - 0, 3, 6 hours (not at [facility])     Status: None   Collection Time: 07/16/16  4:01 PM  Result Value Ref Range   Troponin i, poc 0.01 0.00 - 0.08 ng/mL   Comment 3            Comment: Due to the release kinetics of cTnI, a negative result within the first hours of the onset of symptoms does not rule out myocardial infarction with certainty. If myocardial infarction is still suspected, repeat the test at appropriate intervals.   Creatinine, serum     Status: Abnormal   Collection Time: 07/16/16  5:15 PM  Result Value Ref Range   Creatinine, Ser 1.54 (H) 0.61 - 1.24 mg/dL   GFR calc non Af Amer 45 (L) >60 mL/min   GFR calc Af Amer 53 (L) >60 mL/min    Comment: (NOTE) The eGFR has been calculated using the CKD EPI equation. This calculation has not been validated in all clinical situations. eGFR's persistently <60 mL/min signify possible Chronic Kidney Disease.   Troponin I     Status: None   Collection Time: 07/16/16  5:15 PM  Result Value Ref Range   Troponin I <0.03 <0.03 ng/mL  I-Stat Troponin, ED - 0, 3, 6 hours (not at [facility])     Status: None   Collection Time: 07/16/16  5:23 PM  Result Value Ref Range   Troponin i, poc 0.01 0.00 - 0.08 ng/mL   Comment 3            Comment: Due to the release kinetics of cTnI, a negative result within the first hours of the onset of symptoms does not rule out myocardial infarction with certainty. If  myocardial infarction is still suspected, repeat the test at  appropriate intervals.    Ct Head Wo Contrast  Result Date: 07/16/2016 CLINICAL DATA:  Right arm and leg weakness, unsteady gait and altered mental status since 07/13/2016. EXAM: CT HEAD WITHOUT CONTRAST TECHNIQUE: Contiguous axial images were obtained from the base of the skull through the vertex without intravenous contrast. COMPARISON:  Head CT scan 08/11/2009. FINDINGS: Brain: No acute abnormality including hemorrhage, infarct, mass lesion, mass effect, midline shift or abnormal extra-axial fluid collection is identified. No hydrocephalus or pneumocephalus. Vascular: Atherosclerotic vascular disease is noted. Skull: Intact Sinuses/Orbits: Unremarkable. Other: None. IMPRESSION: No acute abnormality. Atherosclerosis. Electronically Signed   By: Inge Rise M.D.   On: 07/16/2016 12:28   Mr Brain Wo Contrast  Result Date: 07/16/2016 CLINICAL DATA:  65 year old male with right arm and leg weakness. Un steady gait and altered mental status since 07/13/2016. Subsequent encounter. EXAM: MRI HEAD WITHOUT CONTRAST TECHNIQUE: Multiplanar, multiecho pulse sequences of the brain and surrounding structures were obtained without intravenous contrast. COMPARISON:  07/16/2016 head CT. FINDINGS: Brain: Acute nonhemorrhagic left pontine infarct. Remote small right paracentral pontine infarct. Remote tiny right thalamic infarct. No intracranial hemorrhage. No intracranial mass lesion noted on this unenhanced exam. Mild global atrophy without hydrocephalus. 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 Vascular: Major intracranial vascular structures are patent. Skull and upper cervical spine: Negative. Sinuses/Orbits: No acute orbital abnormality. Minimal mucosal thickening ethmoid sinus air cells. Other: Negative. IMPRESSION: Acute nonhemorrhagic left pontine infarct. Remote small right paracentral pontine infarct. Remote tiny right thalamic infarct. Mild global atrophy without hydrocephalus.  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 Electronically Signed   By: Genia Del M.D.   On: 07/16/2016 15:17    Review Of Systems Constitutional: No fever, chills , weight loss or gain. Eyes: No vision change, Wears glasses, no contact lens, no catararact surgery. Ears: No hearing loss, No tinnitus. Respiratory: No asthma, Positive COPD, No pneumonias or shortness of breath. No hemoptysis. Cardiovascular: Occasional chest pain, palpitation or leg edema. Gastrointestinal: No nausea, vomiting or diarrhea or constipation. No GI bleed. No hepatitis. Genitourinary: No dysuria, hematuria or kidney stone. No incontinance. Neurological: Occasional headache, positive stroke. No seizures.  Psychiatry: No psych facility admission for anxiety, depression or suicide. No detox. Skin: No rash. Musculoskeletal: Positive joint pain, No fibromyalgia. Positive neck pain. Positive back pain. Lymphadenopathy: No lymphadenopathy Hematology: No anemia or easy bruising.  Blood pressure 153/88, pulse 75, temperature 97.4 F (36.3 C), resp. rate 26, SpO2 99 %. There is no height or weight on file to calculate BMI. General appearance: alert, cooperative, appears stated age and no distress Head: Normocephalic, atraumatic Eyes: Pink conjunctivae/corneas clear. PERRL, EOM's intact.  Neck: No adenopathy, no carotid bruit, no JVD, supple, symmetrical, trachea midline and thyroid not enlarged. Resp: Clear to auscultation bilaterally Cardio: Regular rate and rhythm, S1, S2 normal, II/VI systolic murmur, click, rub or gallop GI: Soft, non-tender; bowel sounds normal; no masses,  no organomegaly Extremities: No cyanosis or edema. Skin: Warm and dry. No rashes or lesions Neurologic: Alert and oriented X 3, normal strength and tone.   Assessment/Plan Acute left pontine infarct Old stroke Chest pain CAD S/P stent in LCx. CABG Hypertension Hyperlipidemia  Agree with stroke work-up. EKG  changes are similar to old ones. Patient agrees to postpone cardiac work-up for now. Echocardiogram, if needed TEE.  Birdie Riddle, MD  07/16/2016, 8:34 PM

## 2016-07-16 NOTE — ED Notes (Signed)
Patient transported to MRI 

## 2016-07-16 NOTE — ED Notes (Signed)
[facility] transporting patient now.

## 2016-07-16 NOTE — ED Triage Notes (Signed)
Per EMS pt from home with c/o dizziness and lightheadedness since Saturday. Hx of vertigo.

## 2016-07-17 ENCOUNTER — Observation Stay (HOSPITAL_BASED_OUTPATIENT_CLINIC_OR_DEPARTMENT_OTHER): Payer: Medicare HMO

## 2016-07-17 ENCOUNTER — Observation Stay (HOSPITAL_COMMUNITY): Payer: Medicare HMO

## 2016-07-17 ENCOUNTER — Other Ambulatory Visit (HOSPITAL_COMMUNITY): Payer: Medicare HMO

## 2016-07-17 DIAGNOSIS — R29701 NIHSS score 1: Secondary | ICD-10-CM | POA: Diagnosis present

## 2016-07-17 DIAGNOSIS — Z7982 Long term (current) use of aspirin: Secondary | ICD-10-CM | POA: Diagnosis not present

## 2016-07-17 DIAGNOSIS — I639 Cerebral infarction, unspecified: Secondary | ICD-10-CM

## 2016-07-17 DIAGNOSIS — E663 Overweight: Secondary | ICD-10-CM | POA: Diagnosis present

## 2016-07-17 DIAGNOSIS — J449 Chronic obstructive pulmonary disease, unspecified: Secondary | ICD-10-CM | POA: Diagnosis present

## 2016-07-17 DIAGNOSIS — N183 Chronic kidney disease, stage 3 (moderate): Secondary | ICD-10-CM | POA: Diagnosis present

## 2016-07-17 DIAGNOSIS — R079 Chest pain, unspecified: Secondary | ICD-10-CM | POA: Diagnosis present

## 2016-07-17 DIAGNOSIS — Z9114 Patient's other noncompliance with medication regimen: Secondary | ICD-10-CM | POA: Diagnosis not present

## 2016-07-17 DIAGNOSIS — F1210 Cannabis abuse, uncomplicated: Secondary | ICD-10-CM | POA: Diagnosis present

## 2016-07-17 DIAGNOSIS — Z6827 Body mass index (BMI) 27.0-27.9, adult: Secondary | ICD-10-CM | POA: Diagnosis not present

## 2016-07-17 DIAGNOSIS — Z8249 Family history of ischemic heart disease and other diseases of the circulatory system: Secondary | ICD-10-CM | POA: Diagnosis not present

## 2016-07-17 DIAGNOSIS — I5041 Acute combined systolic (congestive) and diastolic (congestive) heart failure: Secondary | ICD-10-CM | POA: Diagnosis not present

## 2016-07-17 DIAGNOSIS — R29810 Facial weakness: Secondary | ICD-10-CM | POA: Diagnosis present

## 2016-07-17 DIAGNOSIS — E785 Hyperlipidemia, unspecified: Secondary | ICD-10-CM | POA: Diagnosis present

## 2016-07-17 DIAGNOSIS — I2510 Atherosclerotic heart disease of native coronary artery without angina pectoris: Secondary | ICD-10-CM | POA: Diagnosis present

## 2016-07-17 DIAGNOSIS — I10 Essential (primary) hypertension: Secondary | ICD-10-CM | POA: Diagnosis not present

## 2016-07-17 DIAGNOSIS — Z951 Presence of aortocoronary bypass graft: Secondary | ICD-10-CM | POA: Diagnosis not present

## 2016-07-17 DIAGNOSIS — I130 Hypertensive heart and chronic kidney disease with heart failure and stage 1 through stage 4 chronic kidney disease, or unspecified chronic kidney disease: Secondary | ICD-10-CM | POA: Diagnosis present

## 2016-07-17 DIAGNOSIS — R42 Dizziness and giddiness: Secondary | ICD-10-CM | POA: Diagnosis present

## 2016-07-17 DIAGNOSIS — K219 Gastro-esophageal reflux disease without esophagitis: Secondary | ICD-10-CM | POA: Diagnosis present

## 2016-07-17 DIAGNOSIS — Z955 Presence of coronary angioplasty implant and graft: Secondary | ICD-10-CM | POA: Diagnosis not present

## 2016-07-17 DIAGNOSIS — I5042 Chronic combined systolic (congestive) and diastolic (congestive) heart failure: Secondary | ICD-10-CM | POA: Diagnosis present

## 2016-07-17 DIAGNOSIS — Z8673 Personal history of transient ischemic attack (TIA), and cerebral infarction without residual deficits: Secondary | ICD-10-CM | POA: Diagnosis not present

## 2016-07-17 DIAGNOSIS — G8191 Hemiplegia, unspecified affecting right dominant side: Secondary | ICD-10-CM | POA: Diagnosis present

## 2016-07-17 DIAGNOSIS — M1990 Unspecified osteoarthritis, unspecified site: Secondary | ICD-10-CM | POA: Diagnosis present

## 2016-07-17 DIAGNOSIS — Z87891 Personal history of nicotine dependence: Secondary | ICD-10-CM | POA: Diagnosis not present

## 2016-07-17 LAB — VAS US CAROTID
LCCAPDIAS: 33 cm/s
LEFT ECA DIAS: -13 cm/s
LEFT VERTEBRAL DIAS: 12 cm/s
LICADDIAS: -38 cm/s
LICAPDIAS: -21 cm/s
LICAPSYS: -65 cm/s
Left CCA dist dias: -13 cm/s
Left CCA dist sys: -86 cm/s
Left CCA prox sys: 210 cm/s
Left ICA dist sys: -105 cm/s
RIGHT ECA DIAS: -26 cm/s
RIGHT VERTEBRAL DIAS: 6 cm/s
Right CCA prox dias: 15 cm/s
Right CCA prox sys: 100 cm/s
Right cca dist sys: -79 cm/s

## 2016-07-17 LAB — RAPID URINE DRUG SCREEN, HOSP PERFORMED
AMPHETAMINES: NOT DETECTED
Barbiturates: NOT DETECTED
Benzodiazepines: NOT DETECTED
Cocaine: NOT DETECTED
Opiates: NOT DETECTED
Tetrahydrocannabinol: POSITIVE — AB

## 2016-07-17 LAB — ECHOCARDIOGRAM COMPLETE
Height: 68 in
Weight: 2860.69 oz

## 2016-07-17 MED ORDER — SIMVASTATIN 40 MG PO TABS
40.0000 mg | ORAL_TABLET | Freq: Every day | ORAL | Status: DC
  Administered 2016-07-17: 40 mg via ORAL
  Filled 2016-07-17: qty 1

## 2016-07-17 MED ORDER — ASPIRIN EC 325 MG PO TBEC
325.0000 mg | DELAYED_RELEASE_TABLET | Freq: Every day | ORAL | Status: DC
  Administered 2016-07-17: 325 mg via ORAL
  Filled 2016-07-17: qty 1

## 2016-07-17 MED ORDER — ASPIRIN 325 MG PO TBEC: 325.0000 mg | DELAYED_RELEASE_TABLET | Freq: Every day | ORAL | 3 refills | Status: DC

## 2016-07-17 MED ORDER — SIMVASTATIN 20 MG PO TABS: 20.0000 mg | ORAL_TABLET | Freq: Every day | ORAL | 3 refills | Status: DC

## 2016-07-17 MED ORDER — CLOPIDOGREL BISULFATE 75 MG PO TABS: 75.0000 mg | ORAL_TABLET | Freq: Every day | ORAL | 3 refills | Status: DC

## 2016-07-17 NOTE — Evaluation (Signed)
Physical Therapy Evaluation Patient Details Name: Marcus ScarletJames J Antonio MRN: [id number] DOB: 11/11/1950 Today's Date: 07/17/2016   History of Present Illness  Marcus [name] is a 65 y.o. male who [facility] to [facility] with right sided weakness x 1 week. MRI brain revealed a subacute left pontine stroke.  Clinical Impression  Pt admitted with above diagnosis. Pt currently with functional limitations due to the deficits listed below (see PT Problem List).  Pt will benefit from skilled PT to increase their independence and safety with mobility to allow discharge to the venue listed below.       Follow Up Recommendations No PT follow up    Equipment Recommendations  None recommended by PT    Recommendations for Other Services       Precautions / Restrictions Precautions Precautions: Fall Restrictions Weight Bearing Restrictions: No      Mobility  Bed Mobility Overal bed mobility: Modified Independent             General bed mobility comments: HOB elevated; use of bedrail  Transfers Overall transfer level: Independent Equipment used: None                Ambulation/Gait Ambulation/Gait assistance: Min guard Ambulation Distance (Feet): 350 Feet Assistive device: None Gait Pattern/deviations: Decreased stance time - right;Decreased dorsiflexion - right   Gait velocity interpretation: Below normal speed for age/gender General Gait Details: pt reports he feels almost back to baseline for mobility; no LOB with mobility  Stairs            Wheelchair Mobility    Modified Rankin (Stroke Patients Only) Modified Rankin (Stroke Patients Only) Pre-Morbid Rankin Score: Slight disability Modified Rankin: Slight disability     Balance                                             Pertinent Vitals/Pain Pain Assessment: No/denies pain    Home Living Family/patient expects to be discharged to:: Private residence Living Arrangements:  Parent;Other relatives (father, brother) Available Help at Discharge: Family;Available 24 hours/day Type of Home: House Home Access: Stairs to enter Entrance Stairs-Rails: Can reach both;Left;Right Entrance Stairs-Number of Steps: 2 Home Layout: One level Home Equipment: Cane - single point Additional Comments: 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 able to perform most basic ADLs; pt assists with shoes and cooking, cleaning    Prior Function Level of Independence: Independent with assistive device(s)               Hand Dominance   Dominant Hand: Left    Extremity/Trunk Assessment               Lower Extremity Assessment: Generalized weakness         Communication   Communication: No difficulties  Cognition Arousal/Alertness: Awake/alert Behavior During Therapy: WFL for tasks assessed/performed Overall Cognitive Status: Within Functional Limits for tasks assessed                      General Comments      Exercises     Assessment/Plan    PT Assessment Patient needs continued PT services  PT Problem List Decreased strength;Decreased balance;Decreased mobility          PT Treatment Interventions DME instruction;Gait training;Stair training;Functional mobility training;Therapeutic activities;Therapeutic exercise;Balance training;Neuromuscular re-education;Patient/family education    PT Goals (Current goals can be found in  the Care Plan section)  Acute Rehab PT Goals Patient Stated Goal: to return home; be back to baseline PT Goal Formulation: With patient Time For Goal Achievement: 07/24/16 Potential to Achieve Goals: Good    Frequency Min 4X/week   Barriers to discharge        Co-evaluation               End of Session Equipment Utilized During Treatment: Gait belt Activity Tolerance: Patient tolerated treatment well Patient left: in bed;with call bell/phone within reach (to transport to MRI) Nurse Communication: Mobility  status    Functional Assessment Tool Used: clinical judgement Functional Limitation: Mobility: Walking and moving around Mobility: Walking and Moving Around Current Status ([id number](G8978): At least 1 percent but less than 20 percent impaired, limited or restricted Mobility: Walking and Moving Around Goal Status [phone]([id number]): At least 1 percent but less than 20 percent impaired, limited or restricted    Time: 0830-0853 PT Time Calculation (min) (ACUTE ONLY): 23 min   Charges:   PT Evaluation $PT Eval Moderate Complexity: 1 Procedure PT Treatments $Gait Training: 8-22 mins   PT G Codes:   PT G-Codes **NOT FOR INPATIENT CLASS** Functional Assessment Tool Used: clinical judgement Functional Limitation: Mobility: Walking and moving around Mobility: Walking and Moving Around Current Status (U9811(G8978): At least 1 percent but less than 20 percent impaired, limited or restricted Mobility: Walking and Moving Around Goal Status [phone](G8979): At least 1 percent but less than 20 percent impaired, limited or restricted       Clarita CraneStephanie F Boniface Goffe, PT, DPT 07/17/16 9:07 AM [phone]

## 2016-07-17 NOTE — Progress Notes (Signed)
[facility]                                                                              Patient Demographics  Marcus [name], is a 65 y.o. male, DOB - 1951/05/16, QVZ:[id number]  Admit date - 07/16/2016   Admitting Physician Calvert Cantor, MD  Outpatient Primary MD for the patient is Dorrene German, MD  Outpatient specialists:   LOS - 0  days    Chief Complaint  Patient presents with  . Dizziness       Brief summary   Marcus [name] is a 65 y.o. male with medical history significant of HTN, CAD with stent to Lcirx, CABG, COPD, CKD 3, who presents to the ER with dizziness, being off balance and weakness in right leg and right arm for 1 wk. He states that when he walks, he feels very unsteady and feels that his right leg and arm have been weak. He usually walks with a cane. He has not had any numbness or tingling or visual and speech changes.  When asked about his medications he admits that he does not take pills regularly and misses them about 2-3 x wk. No h/o prior CVAs/ TIAs.  ED Course: MRI show nonhemorrhagic left pontine infarct and a remote small right paracentral pontine infract and right thalamic infarct, partially empty non expanded sella with small pituitary.     Assessment & Plan   Right arm and leg weakness with Acute cerebral infarction  - MRI of the brain showed acute nonhemorrhagic left pontine infarct - MRA of the brain negative - resumed ASA/Plavix - 2-D echo pending  - Carotid Doppler showed 1-39% ICA stenosis bilaterally  - PTOT evaluation recommended no PT follow-up  - Continue aspirin, Plavix, statin - Neurology following  Active Problems:    CAD (coronary artery disease) - s/p Stent and then CABG - ASA/Plavix- resume Metoprolol once acute CVA resolved  Abnormal EKG - admits to occasional mild pain in left chest which he thinks is arthritis - troponin negative -Patient was seen by Dr. Elveria Rising who recommended 2-D  echocardiogram, if needed TEE. EKG changes are similar to old ones.  CKD 3 - stable   Chronic combined systolic and diastolic CHF, NYHA class 3 (HCC) - EF 45-50% and grade 1dd in 2015 - holding ACE I and BB for now - compensated    Benign essential HTN - as above, holding antihypertensives today    HLD (hyperlipidemia) - statin    Non compliance w medication regimen  -advised compliance  Marijuana abuse - advised to discontinue  Code Status: full  Code  DVT Prophylaxis:  Lovenox Family Communication: Discussed in detail with the patient, all imaging results, lab results explained to the patient    Disposition Plan:   Time Spent in minutes  25 minutes  Procedures:    Consultants:   Neuro Cardiology  Antimicrobials:      Medications  Scheduled Meds: .  stroke: mapping our early stages of recovery book   Does not apply Once  . aspirin EC  325 mg Oral QHS  . clopidogrel  75 mg Oral QHS  .  enoxaparin (LOVENOX) injection  40 mg Subcutaneous Q24H  . gabapentin  300 mg Oral BID  . pantoprazole  40 mg Oral Daily  . simvastatin  20 mg Oral QHS  . tamsulosin  0.4 mg Oral QHS   Continuous Infusions: PRN Meds:.senna-docusate, traMADol, zolpidem   Antibiotics   Anti-infectives    None        Subjective:   [name] was seen and examined today.  Feels a lot better today, no significant weakness. Patient denies dizziness, chest pain, shortness of breath, abdominal pain, N/V/D/C, new weakness, numbess, tingling. No acute events overnight.    Objective:   Vitals:   07/17/16 0320 07/17/16 0520 07/17/16 0745 07/17/16 1000  BP: (!) 107/53 115/68 100/61 (!) 147/73  Pulse: 63 (!) 56 71 72  Resp: 16 18 16 18   Temp: 97.8 F (36.6 C) 97.8 F (36.6 C) 97.5 F (36.4 C) 97.8 F (36.6 C)  TempSrc: Oral Oral Axillary Oral  SpO2: 100% 99% 98% 99%  Weight:      Height:        Intake/Output Summary (Last 24 hours) at 07/17/16 1149 Last data filed at  07/17/16 0500  Gross per 24 hour  Intake                0 ml  Output              400 ml  Net             -400 ml     Wt Readings from Last 3 Encounters:  07/16/16 81.1 kg (178 lb 12.7 oz)  07/16/16 84.8 kg (187 lb)  09/21/15 85.2 kg (187 lb 14.4 oz)     Exam  General: Alert and oriented x 3, NAD  HEENT:  PERRLA, EOMI, Anicteric Sclera, mucous membranes moist.   Neck: Supple, no JVD, no masses  Cardiovascular: S1 S2 auscultated, no rubs, murmurs or gallops. Regular rate and rhythm.  Respiratory: Clear to auscultation bilaterally, no wheezing, rales or rhonchi  Gastrointestinal: Soft, nontender, nondistended, + bowel sounds  Ext: no cyanosis clubbing or edema  Neuro: AAOx3, Cr N's II- XII. Strength 5/5 upper and lower extremities bilaterally  Skin: No rashes  Psych: Normal affect and demeanor, alert and oriented x3    Data Reviewed:  I have personally reviewed following labs and imaging studies  Micro Results No results found for this or any previous visit (from the past 240 hour(s)).  Radiology Reports Ct Head Wo Contrast  Result Date: 07/16/2016 CLINICAL DATA:  Right arm and leg weakness, unsteady gait and altered mental status since 07/13/2016. EXAM: CT HEAD WITHOUT CONTRAST TECHNIQUE: Contiguous axial images were obtained from the base of the skull through the vertex without intravenous contrast. COMPARISON:  Head CT scan 08/11/2009. FINDINGS: Brain: No acute abnormality including hemorrhage, infarct, mass lesion, mass effect, midline shift or abnormal extra-axial fluid collection is identified. No hydrocephalus or pneumocephalus. Vascular: Atherosclerotic vascular disease is noted. Skull: Intact Sinuses/Orbits: Unremarkable. Other: None. IMPRESSION: No acute abnormality. Atherosclerosis. Electronically Signed   By: Drusilla Kanner M.D.   On: 07/16/2016 12:28   Mr Maxine Glenn Head Wo Contrast  Result Date: 07/17/2016 CLINICAL DATA:  Stroke left pons. EXAM: MRA HEAD  WITHOUT CONTRAST TECHNIQUE: Angiographic images of the Circle of Willis were obtained using MRA technique without intravenous contrast. COMPARISON:  MRI head 07/16/2016 FINDINGS: Both vertebral arteries are widely patent. PICA patent bilaterally. Basilar appears normal. Superior cerebellar and posterior cerebral arteries appear normal. Cavernous carotid  widely patent bilaterally with mild atherosclerotic irregularity. Anterior and middle cerebral arteries widely patent bilaterally without stenosis or branch occlusion Negative for cerebral aneurysm. IMPRESSION: Negative Electronically Signed   By: Marlan Palauharles  Clark M.D.   On: 07/17/2016 09:44   Mr Brain Wo Contrast  Result Date: 07/16/2016 CLINICAL DATA:  65 year old male with right arm and leg weakness. Un steady gait and altered mental status since 07/13/2016. Subsequent encounter. EXAM: MRI HEAD WITHOUT CONTRAST TECHNIQUE: Multiplanar, multiecho pulse sequences of the brain and surrounding structures were obtained without intravenous contrast. COMPARISON:  07/16/2016 head CT. FINDINGS: Brain: Acute nonhemorrhagic left pontine infarct. Remote small right paracentral pontine infarct. Remote tiny right thalamic infarct. No intracranial hemorrhage. No intracranial mass lesion noted on this unenhanced exam. Mild global atrophy without hydrocephalus. 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 Vascular: Major intracranial vascular structures are patent. Skull and upper cervical spine: Negative. Sinuses/Orbits: No acute orbital abnormality. Minimal mucosal thickening ethmoid sinus air cells. Other: Negative. IMPRESSION: Acute nonhemorrhagic left pontine infarct. Remote small right paracentral pontine infarct. Remote tiny right thalamic infarct. Mild global atrophy without hydrocephalus. 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 Electronically Signed   By: Lacy DuverneySteven  Olson M.D.   On: 07/16/2016 15:17   Mr Lumbar Spine Wo  Contrast  Result Date: 06/30/2016 CLINICAL DATA:  Low back pain with bilateral leg pain for 1.5 months. EXAM: MRI LUMBAR SPINE WITHOUT CONTRAST TECHNIQUE: Multiplanar, multisequence MR imaging of the lumbar spine was performed. No intravenous contrast was administered. COMPARISON:  Multiple exams, including 10/18/2009 FINDINGS: Segmentation: The lowest lumbar type non-rib-bearing vertebra is labeled as L5. Alignment:  No vertebral subluxation is observed. Vertebrae:  Anterior interbody spurring at L2-3, L3-4, and L4-5. Conus medullaris: Extends to the L1 level and appears normal. Paraspinal and other soft tissues: Bridging spurring/partial fusion of the upper sacroiliac joints. Disc levels: L1-2: Unremarkable. L2-3:  Unremarkable. L3-4: No impingement. Mild disc bulge and mild left facet arthropathy. L4-5: Borderline right foraminal stenosis and borderline left subarticular lateral recess stenosis due to disc bulge and facet arthropathy. L5-S1: Mild to moderate bilateral foraminal stenosis due to disc bulge, small central disc protrusion, intervertebral spurring, and facet arthropathy. IMPRESSION: 1. Lumbar spondylosis and degenerative disc disease, causing mild to moderate bilateral foraminal impingement at the L5-S1 level minimally worsened compared to prior. Electronically Signed   By: Gaylyn RongWalter  Liebkemann M.D.   On: 06/30/2016 17:45    Lab Data:  CBC:  Recent Labs Lab 07/16/16 1149  WBC 4.0  NEUTROABS 1.8  HGB 12.6*  HCT 39.3  MCV 83.3  PLT 167   Basic Metabolic Panel:  Recent Labs Lab 07/16/16 1149 07/16/16 1715  NA 142  --   K 4.4  --   CL 111  --   CO2 24  --   GLUCOSE 116*  --   BUN 18  --   CREATININE 1.55* 1.54*  CALCIUM 8.5*  --    GFR: Estimated Creatinine Clearance: 45.6 mL/min (by C-G formula based on SCr of 1.54 mg/dL (H)). Liver Function Tests:  Recent Labs Lab 07/16/16 1149  AST 22  ALT 18  ALKPHOS 106  BILITOT 1.0  PROT 6.5  ALBUMIN 3.4*   No results  for input(s): LIPASE, AMYLASE in the last 168 hours. No results for input(s): AMMONIA in the last 168 hours. Coagulation Profile: No results for input(s): INR, PROTIME in the last 168 hours. Cardiac Enzymes:  Recent Labs Lab 07/16/16 1715  TROPONINI <0.03   BNP (  last 3 results) No results for input(s): PROBNP in the last 8760 hours. HbA1C: No results for input(s): HGBA1C in the last 72 hours. CBG:  Recent Labs Lab 07/16/16 1057  GLUCAP 80   Lipid Profile: No results for input(s): CHOL, HDL, LDLCALC, TRIG, CHOLHDL, LDLDIRECT in the last 72 hours. Thyroid Function Tests: No results for input(s): TSH, T4TOTAL, FREET4, T3FREE, THYROIDAB in the last 72 hours. Anemia Panel: No results for input(s): VITAMINB12, FOLATE, FERRITIN, TIBC, IRON, RETICCTPCT in the last 72 hours. Urine analysis:    Component Value Date/Time   COLORURINE YELLOW 09/12/2009 1111   APPEARANCEUR CLEAR 09/12/2009 1111   LABSPEC 1.020 09/12/2009 1111   PHURINE 7.0 09/12/2009 1111   GLUCOSEU NEGATIVE 09/12/2009 1111   HGBUR NEGATIVE 09/12/2009 1111   BILIRUBINUR NEGATIVE 09/12/2009 1111   KETONESUR NEGATIVE 09/12/2009 1111   PROTEINUR NEGATIVE 09/12/2009 1111   UROBILINOGEN 0.2 09/12/2009 1111   NITRITE NEGATIVE 09/12/2009 1111   LEUKOCYTESUR  09/12/2009 1111    NEGATIVE MICROSCOPIC NOT DONE ON URINES WITH NEGATIVE PROTEIN, BLOOD, LEUKOCYTES, NITRITE, OR GLUCOSE <1000 mg/dL.     Natsuko Kelsay M.D. [facility] 07/17/2016, 11:49 AM  Pager: [phone] Between 7am to 7pm - call Pager - [phone]  After 7pm go to [email or web address] - password [email or web address]  Call night coverage person covering after 7pm

## 2016-07-17 NOTE — Progress Notes (Signed)
STROKE TEAM PROGRESS NOTE   HISTORY OF PRESENT ILLNESS (per record) Marcus [name] is an 65 y.o. male who [facility] right sided weakness x 1 week. MRI brain revealed a subacute left pontine stroke. Also noted on MRI was a chronic central pontine lacune, a remote tiny right thalamic infarct and mild global atrophy without hydrocephalus. Other symptoms included dizziness and sensation of being off balance while ambulating. Symptoms began one week ago. His PMHx includes CAD s/p stent, CABG, CHF, HTN, COPD and CKD3. He has no prior history of stroke. The patient denies speech difficulty or paresthesias. He is partially noncompliant with his medications. Patient was not administered IV t-PA secondary to delay in arrival. He was [facility] and admitted  for further evaluation and treatment.   SUBJECTIVE (INTERVAL HISTORY) No family is at bedside. He still has mild right facial droop and right arm dysmetria. He is interested in PREMIERS trial.   OBJECTIVE Temp:  [97.4 F (36.3 C)-98 F (36.7 C)] 97.8 F (36.6 C) (11/08 1000) Pulse Rate:  [55-75] 72 (11/08 1000) Cardiac Rhythm: Sinus bradycardia (11/07 2123) Resp:  [14-26] 18 (11/08 1000) BP: (100-153)/(53-88) 147/73 (11/08 1000) SpO2:  [98 %-100 %] 99 % (11/08 1000) Weight:  [81.1 kg (178 lb 12.7 oz)-84.8 kg (187 lb)] 81.1 kg (178 lb 12.7 oz) (11/07 2120)  CBC:  Recent Labs Lab 07/16/16 1149  WBC 4.0  NEUTROABS 1.8  HGB 12.6*  HCT 39.3  MCV 83.3  PLT 167    Basic Metabolic Panel:  Recent Labs Lab 07/16/16 1149 07/16/16 1715  NA 142  --   K 4.4  --   CL 111  --   CO2 24  --   GLUCOSE 116*  --   BUN 18  --   CREATININE 1.55* 1.54*  CALCIUM 8.5*  --     Lipid Panel:    Component Value Date/Time   CHOL 176 09/22/2015 0308   TRIG 155 (H) 09/22/2015 0308   HDL 38 (L) 09/22/2015 0308   CHOLHDL 4.6 09/22/2015 0308   VLDL 31 09/22/2015 0308   LDLCALC 107 (H) 09/22/2015 0308   HgbA1c:   Lab Results  Component Value Date   HGBA1C 6.3 (H) 09/22/2015   Urine Drug Screen:    Component Value Date/Time   LABOPIA NONE DETECTED 08/11/2009 1920   COCAINSCRNUR POSITIVE (A) 08/11/2009 1920   LABBENZ NONE DETECTED 08/11/2009 1920   AMPHETMU NONE DETECTED 08/11/2009 1920   THCU NONE DETECTED 08/11/2009 1920   LABBARB  08/11/2009 1920    NONE DETECTED        DRUG SCREEN FOR MEDICAL PURPOSES ONLY.  IF CONFIRMATION IS NEEDED FOR ANY PURPOSE, NOTIFY LAB WITHIN 5 DAYS.        LOWEST DETECTABLE LIMITS FOR URINE DRUG SCREEN Drug Class       Cutoff (ng/mL) Amphetamine      1000 Barbiturate      200 Benzodiazepine   200 Tricyclics       300 Opiates          300 Cocaine          300 THC              50      IMAGING I have personally reviewed the radiological images below and agree with the radiology interpretations.  Ct Head Wo [name] 07/16/2016 No acute abnormality. Atherosclerosis.   Mr Marcus [name] 07/17/2016 Negative    Mr  Brain Wo [name] 07/16/2016 Acute nonhemorrhagic left pontine infarct. Remote small right paracentral pontine infarct. Remote tiny right thalamic infarct. Mild global atrophy without hydrocephalus. 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   Carotid Doppler   Findings suggest upper range 1-39% right internal carotid artery stenosis and 1-39% left internal carotid artery stenosis. Vertebral arteries are patent with antegrade flow.   TTE pending   PHYSICAL EXAM  Temp:  [97.5 F (36.4 C)-98 F (36.7 C)] 98 F (36.7 C) (11/08 1351) Pulse Rate:  [55-75] 70 (11/08 1351) Resp:  [14-26] 18 (11/08 1351) BP: (100-153)/(53-88) 127/70 (11/08 1351) SpO2:  [98 %-100 %] 100 % (11/08 1351) Weight:  [178 lb 12.7 oz (81.1 kg)] 178 lb 12.7 oz (81.1 kg) (11/07 2120)  General - Well nourished, well developed, in no apparent distress.  Ophthalmologic - Sharp disc margins OU.   Cardiovascular - Regular rate and  rhythm.  Mental Status -  Level of arousal and orientation to time, place, and person were intact. Language including expression, naming, repetition, comprehension was assessed and found intact. Fund of Knowledge was assessed and was intact.  Cranial Nerves II - XII - II - Visual field intact OU. III, IV, VI - Extraocular movements intact. V - Facial sensation intact bilaterally. VII - right nasolabial fold flattening VIII - Hearing & vestibular intact bilaterally. X - Palate elevates symmetrically. XI - Chin turning & shoulder shrug intact bilaterally. XII - Tongue protrusion intact.  Motor Strength - The patient's strength was normal in all extremities and pronator drift was absent.  Bulk was normal and fasciculations were absent.   Motor Tone - Muscle tone was assessed at the neck and appendages and was normal.  Reflexes - The patient's reflexes were 1+ in all extremities and he had no pathological reflexes.  Sensory - Light touch, temperature/pinprick were assessed and were symmetrical.    Coordination - The patient had right hand FTN mild dysmetria.  Tremor was absent.  Gait and Station - deferred   ASSESSMENT/PLAN Marcus [name] is a 65 y.o. male with history of HTN, CAD with stent, CABG, COPD, CKD 3 presenting with R sided weakness x 1 week. He did not receive IV t-PA due to delay in arrival.   Stroke:  left pontine infarct secondary to small vessel disease source.  Resultant  Right nasolabial fold flattening and right FTN mild dysmetria  MRI  L pontine infarct. Old R paracentral pontine, R thalamic infarcts  MRA  Unremarkable   Carotid Doppler  No significant stenosis   2D Echo  Pending  UDS pending  LDL 107  HgbA1c 6.3  Lovenox 40 mg sq daily for VTE prophylaxis  Diet Heart Room service appropriate? Yes; Fluid consistency: Thin  aspirin 81 mg daily and clopidogrel 75 mg daily prior to admission, now on aspirin 325 mg daily and clopidogrel 75 mg  daily. Continue DAPT for stroke and cardiac prevention  Patient counseled to be compliant with his antithrombotic medications  Ongoing aggressive stroke risk factor management  Therapy recommendations:  No PT  Disposition:  Return home  Hypertension  Stable  Permissive hypertension (OK if < 220/120) but gradually normalize in 5-7 days  Long-term BP goal normotensive  Hyperlipidemia  Home meds:  zocor 20, resumed in hospital  LDL 107, goal < 70  increasing zocor to 40mg  daily  Continue statin at discharge  Other Stroke Risk Factors  Advanced age  ETOH use, advised to drink no more than 2 drink(s)  a day  Overweight , Body mass index is 27.19 kg/m., recommend weight loss, diet and exercise as appropriate   CAD s/p stent, CABG  Combined systolic and diastolic CHF  Other Active Problems  Abnormal EKG  CKD stage 3.  History of noncompliance  Hospital day # 0  Neurology will sign off. Please call with questions. Pt will follow up with Darrol Angel NP at [facility] in about 6 weeks. Thanks for the consult.  Marvel Plan, MD PhD Stroke Neurology 07/17/2016 4:25 PM    To contact Stroke Continuity provider, please refer to [email or web address]. After hours, contact General Neurology

## 2016-07-17 NOTE — Progress Notes (Signed)
Pt was off the floor at MRI for his 0920 vital and neuro check.  Vitals and neuro check taken when the patient returned.  Sondra ComeSilva, Vernetta Dizdarevic M, RN

## 2016-07-17 NOTE — Progress Notes (Signed)
*  PRELIMINARY RESULTS* Vascular Ultrasound Carotid Duplex (Doppler) has been completed.   Findings suggest upper range 1-39% right internal carotid artery stenosis and 1-39% left internal carotid artery stenosis. Vertebral arteries are patent with antegrade flow.  07/17/2016 10:04 AM Gertie FeyMichelle Isatu Macinnes, BS, RVT, RDCS, RDMS

## 2016-07-17 NOTE — Progress Notes (Signed)
Speech Language Pathology  Patient Details Name: Marcus [name] MRN: [id number] DOB: 01/31/1951 Today's Date: 07/17/2016 Time: 1447-     Screen did not reveal need for full assessment. Speech appeared mildly dysarthric, pt felt it to be baseline.            [name], Marcus [name] 07/17/2016, 2:51 PM     Breck CoonsLisa [name] Lonell FaceLitaker M.Ed [employer] Pager [phone]

## 2016-07-17 NOTE — Evaluation (Signed)
Occupational Therapy Evaluation Patient Details Name: Marcus [name] MRN: [id number] DOB: 05/22/1951 Today's Date: 07/17/2016    History of Present Illness Marcus [name] is a 65 y.o. male who [facility] with right sided weakness x 1 week. MRI brain revealed a subacute left pontine stroke.   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 Currently pt overall supervision for ADL and functional mobility. Pt with LOB x2 during shower transfer this session; able to self correct without physical assist. Pt planning to d/c home with 24/7 supervision from family. Pt would benefit from continued skilled OT to address established goals.    Follow Up Recommendations  No OT follow up;Supervision - Intermittent    Equipment Recommendations  None recommended by OT    Recommendations for Other Services       Precautions / Restrictions Precautions Precautions: Fall Restrictions Weight Bearing Restrictions: No      Mobility Bed Mobility Overal bed mobility: Modified Independent                Transfers Overall transfer level: Needs assistance Equipment used: None Transfers: Sit to/from Stand Sit to Stand: Supervision         General transfer comment: Supervision for safety. No dizziness or unsteadiness.    Balance Overall balance assessment: Needs assistance Sitting-balance support: Feet supported;No upper extremity supported Sitting balance-Leahy Scale: Good     Standing balance support: No upper extremity supported;During functional activity Standing balance-Leahy Scale: Good                              ADL Overall ADL's : Needs assistance/impaired Eating/Feeding: Independent;Sitting   Grooming: Supervision/safety;Standing;Wash/dry hands;Wash/dry face;Oral care   Upper Body Bathing: Set up;Supervision/ safety;Sitting   Lower Body Bathing: Set up;Supervison/ safety;Sit  to/from stand   Upper Body Dressing : Set up;Supervision/safety;Sitting   Lower Body Dressing: Set up;Supervision/safety;Sit to/from stand   Toilet Transfer: Supervision/safety;Ambulation;Regular Toilet   Toileting- ArchitectClothing Manipulation and Hygiene: Supervision/safety;Sit to/from stand   Tub/ Shower Transfer: Min guard;Walk-in shower;Ambulation Tub/Shower Transfer Details (indicate cue type and reason): Pt with LOB x2 during shower transfer. Able to self correct without physical assist Functional mobility during ADLs: Supervision/safety       Vision Vision Assessment?: No apparent visual deficits Additional Comments: Pt able to read and complete functional activities without difficulty or complains of visual deficits. Pt reports he feels like he is seeing "spots floating" in his visual field.   Perception     Praxis      Pertinent Vitals/Pain Pain Assessment: No/denies pain     Hand Dominance Left   Extremity/Trunk Assessment Upper Extremity Assessment Upper Extremity Assessment: Overall WFL for tasks assessed   Lower Extremity Assessment Lower Extremity Assessment: Defer to PT evaluation   Cervical / Trunk Assessment Cervical / Trunk Assessment: Normal   Communication Communication Communication: No difficulties   Cognition Arousal/Alertness: Awake/alert Behavior During Therapy: WFL for tasks assessed/performed Overall Cognitive Status: Within Functional Limits for tasks assessed                     General Comments       Exercises       Shoulder Instructions      Home Living Family/patient expects to be discharged to:: Private residence Living Arrangements: Parent;Other relatives (father, brother) Available Help at Discharge: Family;Available 24 hours/day Type  of Home: House Home Access: Stairs to enter Entergy CorporationEntrance Stairs-Number of Steps: 2 Entrance Stairs-Rails: Can reach both;Left;Right Home Layout: One level     Bathroom Shower/Tub:  Walk-in shower;Door   Bathroom Toilet: Handicapped height     Home Equipment: Cane - single point   Additional Comments: 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 able to perform most basic ADLs; pt assists with shoes and cooking, cleaning      Prior Functioning/Environment Level of Independence: Independent with assistive device(s)                 OT Problem List: Impaired balance (sitting and/or standing);Decreased safety awareness;Decreased knowledge of use of DME or AE   OT Treatment/Interventions: Self-care/ADL training;DME and/or AE instruction;Therapeutic activities;Patient/family education;Balance training    OT Goals(Current goals can be found in the care plan section) Acute Rehab OT Goals Patient Stated Goal: to return home; be back to baseline OT Goal Formulation: With patient Time For Goal Achievement: 07/31/16 Potential to Achieve Goals: Good ADL Goals Additional ADL Goal #1: Pt will perform higher level balance activities without LOB.  Additional ADL Goal #2: Pt will independently verbalize 3 fall prevention strategies.  OT Frequency: Min 2X/week   Barriers to D/C:            Co-evaluation              End of Session Nurse Communication: Mobility status  Activity Tolerance: Patient tolerated treatment well Patient left: in chair;with call bell/phone within reach   Time: 1205-1221 OT Time Calculation (min): 16 min Charges:  OT General Charges $OT Visit: 1 Procedure OT Evaluation $OT Eval Moderate Complexity: 1 Procedure G-Codes: OT G-codes **NOT FOR INPATIENT CLASS** Functional Assessment Tool Used: Clinical judgement Functional Limitation: Self care Self Care Current Status (Z6109(G8987): At least 1 percent but less than 20 percent impaired, limited or restricted Self Care Goal Status (U0454(G8988): At least 1 percent but less than 20 percent impaired, limited or restricted   Gaye AlkenBailey A Calab Sachse M.S., OTR/L Pager: [phone]  07/17/2016, 3:31 PM

## 2016-07-17 NOTE — Progress Notes (Signed)
Ref: Dorrene GermanEdwin A Avbuere, MD   Subjective:  Mild right arm weakness and right facial droop. Carotid doppler with mild disease bilateral. Echocardiogram has mild basal inferior hypokinesia.  Objective:  Vital Signs in the last 24 hours: Temp:  [97.5 F (36.4 C)-98 F (36.7 C)] 98 F (36.7 C) (11/08 1351) Pulse Rate:  [55-72] 70 (11/08 1351) Cardiac Rhythm: Sinus bradycardia (11/08 0701) Resp:  [14-20] 18 (11/08 1351) BP: (100-151)/(53-73) 127/70 (11/08 1351) SpO2:  [98 %-100 %] 100 % (11/08 1351) Weight:  [81.1 kg (178 lb 12.7 oz)] 81.1 kg (178 lb 12.7 oz) (11/07 2120)  Physical Exam: BP Readings from Last 1 Encounters:  07/17/16 127/70    Wt Readings from Last 1 Encounters:  07/16/16 81.1 kg (178 lb 12.7 oz)    Weight change:  Body mass index is 27.19 kg/m. HEENT: [name]/AT, Eyes-Brown, PERL, EOMI, Conjunctiva-Pink, Sclera-Non-icteric Neck: No JVD, No bruit, Trachea midline. Lungs:  Clear, Bilateral. Cardiac:  Regular rhythm, normal S1 and S2, no S3. II/VI systolic murmur. Abdomen:  Soft, non-tender. BS present. Extremities:  No edema present. No cyanosis. No clubbing. CNS: AxOx3, Cranial nerves grossly intact, moves all 4 extremities. Right facial droop and decreased movement of right arm Skin: Warm and dry.   Intake/Output from previous day: 11/07 0701 - 11/08 0700 In: -  Out: 400 [Urine:400]    Lab Results: BMET    Component Value Date/Time   NA 142 07/16/2016 1149   NA 140 09/22/2015 0633   NA 141 09/21/2015 1855   K 4.4 07/16/2016 1149   K 4.4 09/22/2015 0633   K 4.2 09/21/2015 1855   CL 111 07/16/2016 1149   CL 108 09/22/2015 0633   CL 106 09/21/2015 1855   CO2 24 07/16/2016 1149   CO2 27 09/22/2015 0633   CO2 25 09/21/2015 1855   GLUCOSE 116 (H) 07/16/2016 1149   GLUCOSE 101 (H) 09/22/2015 0633   GLUCOSE 135 (H) 09/21/2015 1855   BUN 18 07/16/2016 1149   BUN 11 09/22/2015 0633   BUN 10 09/21/2015 1855   CREATININE 1.54 (H) 07/16/2016 1715   CREATININE  1.55 (H) 07/16/2016 1149   CREATININE 1.47 (H) 09/22/2015 0633   CALCIUM 8.5 (L) 07/16/2016 1149   CALCIUM 8.6 (L) 09/22/2015 0633   CALCIUM 8.8 (L) 09/21/2015 1855   GFRNONAA 45 (L) 07/16/2016 1715   GFRNONAA 45 (L) 07/16/2016 1149   GFRNONAA 48 (L) 09/22/2015 0633   GFRAA 53 (L) 07/16/2016 1715   GFRAA 52 (L) 07/16/2016 1149   GFRAA 56 (L) 09/22/2015 0633   CBC    Component Value Date/Time   WBC 4.0 07/16/2016 1149   RBC 4.72 07/16/2016 1149   HGB 12.6 (L) 07/16/2016 1149   HCT 39.3 07/16/2016 1149   PLT 167 07/16/2016 1149   MCV 83.3 07/16/2016 1149   MCH 26.7 07/16/2016 1149   MCHC 32.1 07/16/2016 1149   RDW 13.7 07/16/2016 1149   LYMPHSABS 1.8 07/16/2016 1149   MONOABS 0.3 07/16/2016 1149   EOSABS 0.1 07/16/2016 1149   BASOSABS 0.0 07/16/2016 1149   HEPATIC Function Panel  Recent Labs  09/21/15 1855 07/16/16 1149  PROT 5.9* 6.5   HEMOGLOBIN A1C No components found for: HGA1C,  MPG CARDIAC ENZYMES Lab Results  Component Value Date   CKTOTAL 132 04/30/2012   CKMB 1.9 04/30/2012   TROPONINI <0.03 07/16/2016   TROPONINI <0.03 09/22/2015   TROPONINI <0.03 09/22/2015   BNP No results for input(s): PROBNP in the last 8760 hours.  TSH No results for input(s): TSH in the last 8760 hours. CHOLESTEROL  Recent Labs  09/22/15 0308  CHOL 176    Scheduled Meds: .  stroke: mapping our early stages of recovery book   Does not apply Once  . aspirin EC  325 mg Oral QHS  . clopidogrel  75 mg Oral QHS  . enoxaparin (LOVENOX) injection  40 mg Subcutaneous Q24H  . gabapentin  300 mg Oral BID  . pantoprazole  40 mg Oral Daily  . simvastatin  40 mg Oral QHS  . tamsulosin  0.4 mg Oral QHS   Continuous Infusions: PRN Meds:.senna-docusate, traMADol, zolpidem  Assessment/Plan: Acute left pontine infarct Old stroke Chest pain CAD S/P LCX stent CABG Hypertension Hyperlipidemia  Continue medical treatment.   LOS: 0 days    Orpah CobbAjay Maston Wight  MD  07/17/2016, 8:47  PM

## 2016-07-17 NOTE — Progress Notes (Signed)
  Echocardiogram 2D Echocardiogram has been performed.  Marcus [name], Marcus [name] M 07/17/2016, 1:56 PM

## 2016-07-18 DIAGNOSIS — N183 Chronic kidney disease, stage 3 (moderate): Secondary | ICD-10-CM

## 2016-07-18 MED ORDER — CLOPIDOGREL BISULFATE 75 MG PO TABS: 75.0000 mg | ORAL_TABLET | Freq: Every day | ORAL | 3 refills | Status: DC

## 2016-07-18 MED ORDER — ASPIRIN 325 MG PO TBEC: 325.0000 mg | DELAYED_RELEASE_TABLET | Freq: Every day | ORAL | 3 refills | Status: DC

## 2016-07-18 MED ORDER — SIMVASTATIN 40 MG PO TABS: 40.0000 mg | ORAL_TABLET | Freq: Every day | ORAL | 3 refills | Status: DC

## 2016-07-18 NOTE — Progress Notes (Signed)
Physical Therapy Treatment Patient Details Name: Marcus [name] MRN: [id number] DOB: 1951/01/09 Today's Date: 07/18/2016    History of Present Illness Marcus [name] is a 65 y.o. male who presented to [facility] with right sided weakness x 1 week. MRI brain revealed a subacute left pontine stroke.    PT Comments    The pt has met all of his goals as of today.  He completed stair training today and is ready to discharge.  Will continue efforts if pt remains hospitalized.  Follow Up Recommendations  No PT follow up     Equipment Recommendations  None recommended by PT    Recommendations for Other Services       Precautions / Restrictions Precautions Precautions: Fall Restrictions Weight Bearing Restrictions: No    Mobility  Bed Mobility               General bed mobility comments: pt OOB and dressing to go home  Transfers Overall transfer level: Modified independent Equipment used: None Transfers: Sit to/from Stand Sit to Stand: Independent         General transfer comment: Pt sat down on couch after gait training without any assistance needed.  Ambulation/Gait Ambulation/Gait assistance: Modified independent (Device/Increase time) Ambulation Distance (Feet): 300 Feet Assistive device: Straight cane;None Gait Pattern/deviations: Step-through pattern;Decreased stance time - right   Gait velocity interpretation: Below normal speed for age/gender General Gait Details: pt states that he feels almost  normal. required vc's for correct placement of cane.   Stairs Stairs: Yes Stairs assistance: Supervision Stair Management: Two rails;With cane;Forwards;Alternating pattern Number of Stairs: 6 General stair comments: supervision for safety due to patients impulsivness; verbal cues for correct sequencing  Wheelchair Mobility    Modified Rankin (Stroke Patients Only)       Balance     Sitting balance-Leahy Scale: Good       Standing balance-Leahy  Scale: Good                      Cognition Arousal/Alertness: Awake/alert Behavior During Therapy: WFL for tasks assessed/performed Overall Cognitive Status: Within Functional Limits for tasks assessed                      Exercises      General Comments        Pertinent Vitals/Pain Pain Assessment: No/denies pain    Home Living                      Prior Function            PT Goals (current goals can now be found in the care plan section) Acute Rehab PT Goals Patient Stated Goal: to return home; be back to baseline PT Goal Formulation: With patient Time For Goal Achievement: 07/24/16 Potential to Achieve Goals: Good Progress towards PT goals: Goals met/education completed, patient discharged from PT    Frequency    Min 4X/week      PT Plan      Co-evaluation             End of Session Equipment Utilized During Treatment: Gait belt Activity Tolerance: Patient tolerated treatment well Patient left: in chair     Time: 7253-6644 PT Time Calculation (min) (ACUTE ONLY): 10 min  Charges:  $Gait Training: 8-22 mins                    G Codes:  Bary Castilla 07/18/2016, 10:41 AM Rito Ehrlich. [place], [place]

## 2016-07-18 NOTE — Discharge Summary (Signed)
Physician Discharge Summary   Patient ID: Marcus [name] MRN: [id number] DOB/AGE: 66/12/1950 66 y.o.  Admit date: 07/16/2016 Discharge date: 07/18/2016  Primary Care Physician:  Dorrene GermanEdwin A Avbuere, MD  Discharge Diagnoses:   . acute CVA (cerebral vascular accident) (HCC) . CAD (coronary artery disease) . Noncompliance with medication regimen Hyperlipidemia Essential hypertension  Consults: Neurology  Recommendations for Outpatient Follow-up:  1. Patient was placed on aspirin and Plavix, simvastatin 2. Please repeat CBC/BMET at next visit   DIET: Heart healthy diet Allergies:   Allergies  Allergen Reactions  . Penicillins Hives and Other (See Comments)    Has patient had a PCN reaction causing immediate rash, facial/tongue/throat swelling, SOB or lightheadedness with hypotension: No Has patient had a PCN reaction causing severe rash involving mucus membranes or skin necrosis: No Has patient had a PCN reaction that required hospitalization No Has patient had a PCN reaction occurring within the last 10 years: No If all of the above answers are "NO", then may proceed with Cephalosporin use.     DISCHARGE MEDICATIONS: Current Discharge Medication List    CONTINUE these medications which have CHANGED   Details  aspirin EC 325 MG EC tablet Take 1 tablet (325 mg total) by mouth at bedtime. Qty: 30 tablet, Refills: 3    clopidogrel (PLAVIX) 75 MG tablet Take 1 tablet (75 mg total) by mouth at bedtime. Qty: 30 tablet, Refills: 3    simvastatin (ZOCOR) 40 MG tablet Take 1 tablet (40 mg total) by mouth at bedtime. Qty: 30 tablet, Refills: 3      CONTINUE these medications which have NOT CHANGED   Details  cetirizine (ZYRTEC) 10 MG tablet Take 10 mg by mouth at bedtime.    gabapentin (NEURONTIN) 300 MG capsule Take 300 mg by mouth 2 (two) times daily.  Refills: 1    indomethacin (INDOCIN) 25 MG capsule Take 25 mg by mouth 3 (three) times daily as needed for mild pain or  moderate pain.    lisinopril (PRINIVIL,ZESTRIL) 2.5 MG tablet Take 2.5 mg by mouth at bedtime.    metoprolol tartrate (LOPRESSOR) 12.5 mg TABS Take 12.5 mg by mouth 2 (two) times daily.    omeprazole (PRILOSEC) 20 MG capsule Take 20 mg by mouth at bedtime.     ondansetron (ZOFRAN) 4 MG tablet Take 1 tablet (4 mg total) by mouth every 8 (eight) hours as needed for nausea or vomiting. Qty: 10 tablet, Refills: 0    tamsulosin (FLOMAX) 0.4 MG CAPS capsule Take 0.4 mg by mouth at bedtime.     tiZANidine (ZANAFLEX) 4 MG tablet Take 4 mg by mouth every 8 (eight) hours as needed for muscle spasms.  Refills: 2    traMADol (ULTRAM) 50 MG tablet Take 50-100 mg by mouth 3 (three) times daily as needed for moderate pain.  Refills: 2    zolpidem (AMBIEN) 10 MG tablet Take 10 mg by mouth at bedtime as needed for sleep.          Brief H and P: For complete details please refer to admission H and P, but in briefJames J Robertsis a 65 y.o.malewith medical history significant of HTN, CAD with stent to Lcirx, CABG, COPD, CKD 3, who presents to the ER with dizziness, being off balance and weakness in right leg and right arm for 1 wk. He states that when he walks, he feels very unsteady and feels that his right leg and arm have been weak. He usually walks with a cane. He  has not had any numbness or tingling or visual and speech changes.  When asked about his medications he admits that he does not take pills regularly and misses them about 2-3 x wk. No h/o prior CVAs/ TIAs.  ED Course:MRI show nonhemorrhagic left pontine infarct and a remote small right paracentral pontine infract and right thalamic infarct, partially empty non expanded sella with small pituitary.   Hospital Course:   Right arm and leg weakness with Acute cerebral infarction  - MRI of the brain showed acute nonhemorrhagic left pontine infarct - MRA of the brain negative - resumed ASA/Plavix - 2-D echo showed EF of 50-55%, grade 1  diastolic dysfunction, no regional wall motion abnormalities - Carotid Doppler showed 1-39% ICA stenosis bilaterally  - PTOT evaluation recommended no PT follow-up  - Continue aspirin, Plavix, statin - Neurology recommended aspirin and Plavix, statin   CAD (coronary artery disease) - s/p Stent and then CABG - ASA/Plavix- resume Metoprolol  Abnormal EKG - admits to occasional mild pain in left chest which he thinks is arthritis - troponin negative -Patient was seen by Dr. Elveria Rising who recommended 2-D echocardiogram, if needed TEE. 2-D echo showed reserved EF with no wall motion abnormalities. Patient was cleared by cardiology. - EKG changes are similar to old ones.  CKD 3 - stable   Chroniccombined systolic and diastolic CHF, NYHA class 3 (HCC) - Improved EF, 50-55% on 2-D echo - compensated  Benign essential HTN - as above, holding antihypertensives today  HLD (hyperlipidemia) - statin  Non compliance w medication regimen -advised compliance  Marijuana abuse - advised to discontinue   Day of Discharge BP 120/76 (BP Location: Left Arm)   Pulse 89   Temp 98 F (36.7 C) (Oral)   Resp 18   Ht 5\' 8"  (1.727 m)   Wt 81.1 kg (178 lb 12.7 oz)   SpO2 98%   BMI 27.19 kg/m   Physical Exam: General: Alert and awake oriented x3 not in any acute distress. HEENT: anicteric sclera, pupils reactive to light and accommodation CVS: S1-S2 clear no murmur rubs or gallops Chest: clear to auscultation bilaterally, no wheezing rales or rhonchi Abdomen: soft nontender, nondistended, normal bowel sounds Extremities: no cyanosis, clubbing or edema noted bilaterally Neuro: Cranial nerves II-XII intact, no focal neurological deficits   The results of significant diagnostics from this hospitalization (including imaging, microbiology, ancillary and laboratory) are listed below for reference.    LAB RESULTS: Basic Metabolic Panel:  Recent Labs Lab 07/16/16 1149  07/16/16 1715  NA 142  --   K 4.4  --   CL 111  --   CO2 24  --   GLUCOSE 116*  --   BUN 18  --   CREATININE 1.55* 1.54*  CALCIUM 8.5*  --    Liver Function Tests:  Recent Labs Lab 07/16/16 1149  AST 22  ALT 18  ALKPHOS 106  BILITOT 1.0  PROT 6.5  ALBUMIN 3.4*   No results for input(s): LIPASE, AMYLASE in the last 168 hours. No results for input(s): AMMONIA in the last 168 hours. CBC:  Recent Labs Lab 07/16/16 1149  WBC 4.0  NEUTROABS 1.8  HGB 12.6*  HCT 39.3  MCV 83.3  PLT 167   Cardiac Enzymes:  Recent Labs Lab 07/16/16 1715  TROPONINI <0.03   BNP: Invalid input(s): POCBNP CBG:  Recent Labs Lab 07/16/16 1057  GLUCAP 80    Significant Diagnostic Studies:  Ct Head Wo Contrast  Result Date: 07/16/2016 CLINICAL  DATA:  Right arm and leg weakness, unsteady gait and altered mental status since 07/13/2016. EXAM: CT HEAD WITHOUT CONTRAST TECHNIQUE: Contiguous axial images were obtained from the base of the skull through the vertex without intravenous contrast. COMPARISON:  Head CT scan 08/11/2009. FINDINGS: Brain: No acute abnormality including hemorrhage, infarct, mass lesion, mass effect, midline shift or abnormal extra-axial fluid collection is identified. No hydrocephalus or pneumocephalus. Vascular: Atherosclerotic vascular disease is noted. Skull: Intact Sinuses/Orbits: Unremarkable. Other: None. IMPRESSION: No acute abnormality. Atherosclerosis. Electronically Signed   By: Drusilla Kannerhomas  Dalessio M.D.   On: 07/16/2016 12:28   Mr Marcus GlennMra Head Wo Contrast  Result Date: 07/17/2016 CLINICAL DATA:  Stroke left pons. EXAM: MRA HEAD WITHOUT CONTRAST TECHNIQUE: Angiographic images of the Circle of Willis were obtained using MRA technique without intravenous contrast. COMPARISON:  MRI head 07/16/2016 FINDINGS: Both vertebral arteries are widely patent. PICA patent bilaterally. Basilar appears normal. Superior cerebellar and posterior cerebral arteries appear normal. Cavernous  carotid widely patent bilaterally with mild atherosclerotic irregularity. Anterior and middle cerebral arteries widely patent bilaterally without stenosis or branch occlusion Negative for cerebral aneurysm. IMPRESSION: Negative Electronically Signed   By: Marlan Palauharles  Clark M.D.   On: 07/17/2016 09:44   Mr Brain Wo Contrast  Result Date: 07/16/2016 CLINICAL DATA:  65 year old male with right arm and leg weakness. Un steady gait and altered mental status since 07/13/2016. Subsequent encounter. EXAM: MRI HEAD WITHOUT CONTRAST TECHNIQUE: Multiplanar, multiecho pulse sequences of the brain and surrounding structures were obtained without intravenous contrast. COMPARISON:  07/16/2016 head CT. FINDINGS: Brain: Acute nonhemorrhagic left pontine infarct. Remote small right paracentral pontine infarct. Remote tiny right thalamic infarct. No intracranial hemorrhage. No intracranial mass lesion noted on this unenhanced exam. Mild global atrophy without hydrocephalus. 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 Vascular: Major intracranial vascular structures are patent. Skull and upper cervical spine: Negative. Sinuses/Orbits: No acute orbital abnormality. Minimal mucosal thickening ethmoid sinus air cells. Other: Negative. IMPRESSION: Acute nonhemorrhagic left pontine infarct. Remote small right paracentral pontine infarct. Remote tiny right thalamic infarct. Mild global atrophy without hydrocephalus. 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 Electronically Signed   By: Lacy DuverneySteven  Olson M.D.   On: 07/16/2016 15:17    2D ECHO: Study Conclusions  - Left ventricle: The cavity size was normal. Systolic function was   normal. The estimated ejection fraction was in the range of 50%   to 55%. Wall motion was normal; there were no regional wall   motion abnormalities. Doppler parameters are consistent with   abnormal left ventricular relaxation (grade 1 diastolic   dysfunction). -  Mitral valve: Calcified annulus. There was mild regurgitation. - Left atrium: The atrium was mildly dilated.   Disposition and Follow-up: Discharge Instructions    Ambulatory referral to Neurology    Complete by:  As directed    [facility]. Thanks.       DISPOSITION: home    DISCHARGE FOLLOW-UP Follow-up Information    Dorrene GermanEdwin A Avbuere, MD. Schedule an appointment as soon as possible for a visit in 2 week(s).   Specialty:  Internal Medicine Contact information: [address] [place] [address] [phone]        Nilda RiggsMARTIN,NANCY CAROLYN, NP. Schedule an appointment as soon as possible for a visit in 6 week(s).   Specialty:  Family Medicine Contact information: [address] [place] [address] [phone]  Time spent on Discharge: 35 mins   Signed:   Gerhart Ruggieri M.D. [facility] 07/18/2016, 10:08 AM Pager: [phone]

## 2016-07-18 NOTE — Care Management Note (Signed)
Case Management Note  Patient Details  Name: Marcus [name] MRN: [id number] Date of Birth: 12/09/1950  Subjective/Objective:    Pt admitted with CVA. He is from home with his father and brother.                 Action/Plan: No f/u per PT/OT and no DME needs. Pt discharging home with self care.   Expected Discharge Date:   (unknown)               Expected Discharge Plan:  Home/Self Care  In-House Referral:     Discharge planning Services     Post Acute Care Choice:    Choice offered to:     DME Arranged:    DME Agency:     HH Arranged:    HH Agency:     Status of Service:  Completed, signed off  If discussed at [employer] Length of Stay Meetings, dates discussed:    Additional Comments:  Kermit BaloKelli F Mechel Haggard, RN 07/18/2016, 11:32 AM

## 2016-07-18 NOTE — Progress Notes (Signed)
Pt discharged at this time taking all personal belongings. IV discontinued, dry dressing applied. Discharge instructions provided with verbal understanding. Pt will follow up per Md order.

## 2016-08-15 ENCOUNTER — Emergency Department (HOSPITAL_COMMUNITY)
Admission: EM | Admit: 2016-08-15 | Discharge: 2016-08-16 | Disposition: A | Discharge status: Home / Self Care | Payer: Medicare HMO | Attending: Emergency Medicine | Admitting: Emergency Medicine

## 2016-08-15 ENCOUNTER — Encounter (HOSPITAL_COMMUNITY): Payer: Self-pay | Admitting: *Deleted

## 2016-08-15 ENCOUNTER — Emergency Department (HOSPITAL_COMMUNITY): Payer: Medicare HMO

## 2016-08-15 DIAGNOSIS — Y929 Unspecified place or not applicable: Secondary | ICD-10-CM | POA: Diagnosis not present

## 2016-08-15 DIAGNOSIS — Y939 Activity, unspecified: Secondary | ICD-10-CM | POA: Diagnosis not present

## 2016-08-15 DIAGNOSIS — Z8673 Personal history of transient ischemic attack (TIA), and cerebral infarction without residual deficits: Secondary | ICD-10-CM | POA: Diagnosis not present

## 2016-08-15 DIAGNOSIS — S6992XA Unspecified injury of left wrist, hand and finger(s), initial encounter: Secondary | ICD-10-CM | POA: Diagnosis present

## 2016-08-15 DIAGNOSIS — N183 Chronic kidney disease, stage 3 (moderate): Secondary | ICD-10-CM | POA: Diagnosis not present

## 2016-08-15 DIAGNOSIS — Z87891 Personal history of nicotine dependence: Secondary | ICD-10-CM | POA: Insufficient documentation

## 2016-08-15 DIAGNOSIS — S62305A Unspecified fracture of fourth metacarpal bone, left hand, initial encounter for closed fracture: Secondary | ICD-10-CM

## 2016-08-15 DIAGNOSIS — S62395A Other fracture of fourth metacarpal bone, left hand, initial encounter for closed fracture: Secondary | ICD-10-CM | POA: Insufficient documentation

## 2016-08-15 DIAGNOSIS — Y999 Unspecified external cause status: Secondary | ICD-10-CM | POA: Diagnosis not present

## 2016-08-15 DIAGNOSIS — S62307A Unspecified fracture of fifth metacarpal bone, left hand, initial encounter for closed fracture: Secondary | ICD-10-CM

## 2016-08-15 DIAGNOSIS — I2510 Atherosclerotic heart disease of native coronary artery without angina pectoris: Secondary | ICD-10-CM | POA: Insufficient documentation

## 2016-08-15 DIAGNOSIS — Z79899 Other long term (current) drug therapy: Secondary | ICD-10-CM | POA: Insufficient documentation

## 2016-08-15 DIAGNOSIS — I5041 Acute combined systolic (congestive) and diastolic (congestive) heart failure: Secondary | ICD-10-CM | POA: Diagnosis not present

## 2016-08-15 DIAGNOSIS — I130 Hypertensive heart and chronic kidney disease with heart failure and stage 1 through stage 4 chronic kidney disease, or unspecified chronic kidney disease: Secondary | ICD-10-CM | POA: Diagnosis not present

## 2016-08-15 DIAGNOSIS — S62397A Other fracture of fifth metacarpal bone, left hand, initial encounter for closed fracture: Secondary | ICD-10-CM | POA: Insufficient documentation

## 2016-08-15 DIAGNOSIS — Z7982 Long term (current) use of aspirin: Secondary | ICD-10-CM | POA: Diagnosis not present
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
## 2016-08-16 DIAGNOSIS — S62395A Other fracture of fourth metacarpal bone, left hand, initial encounter for closed fracture: Secondary | ICD-10-CM | POA: Diagnosis not present

## 2016-08-16 MED ORDER — OXYCODONE-ACETAMINOPHEN 5-325 MG PO TABS
1.0000 | ORAL_TABLET | Freq: Once | ORAL | Status: AC
  Administered 2016-08-16: 1 via ORAL
  Filled 2016-08-16: qty 1

## 2016-08-16 MED ORDER — OXYCODONE-ACETAMINOPHEN 5-325 MG PO TABS
1.0000 | ORAL_TABLET | ORAL | 0 refills | Status: DC | PRN
Start: 2016-08-16 — End: 2017-09-26

## 2016-08-16 NOTE — Progress Notes (Signed)
Orthopedic Tech Progress Note Patient Details:  Raliegh ScarletJames J Gabbert 03/13/1951 [id number]  Ortho Devices Type of Ortho Device: Arm sling, Ulna gutter splint Ortho Device/Splint Location: lue Ortho Device/Splint Interventions: Ordered, Application   Trinna PostMartinez, Lavonn Maxcy J 08/16/2016, 12:36 AM
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
## 2016-08-16 NOTE — Discharge Instructions (Signed)
Call Dr. Merrilee SeashoreKuzma's office tomorrow to schedule a follow up appointment.  Take percocet every 4 hours as needed for pain.  Return to the ED for uncontrolled pain, numbness, changes in the colors of your fingers, or any new or concerning symptoms.

## 2016-08-16 NOTE — ED Notes (Signed)
Pt verbalized understanding of d/c instructions and has no further questions. Pt stable and NAD. Pt d/c home with daughter driving.  

## 2016-08-23 ENCOUNTER — Ambulatory Visit: Payer: Medicaid Other | Admitting: Nurse Practitioner

## 2016-08-26 ENCOUNTER — Ambulatory Visit: Payer: Medicaid Other | Admitting: Nurse Practitioner

## 2016-08-27 ENCOUNTER — Encounter: Payer: Self-pay | Admitting: Nurse Practitioner

## 2016-08-27 ENCOUNTER — Ambulatory Visit: Payer: Medicare HMO | Admitting: Physical Medicine & Rehabilitation

## 2016-09-30 ENCOUNTER — Ambulatory Visit: Payer: Medicare HMO | Admitting: Physical Medicine & Rehabilitation

## 2016-10-14 ENCOUNTER — Encounter: Payer: Medicare HMO | Attending: Physical Medicine & Rehabilitation

## 2016-10-14 ENCOUNTER — Ambulatory Visit: Payer: Medicare HMO | Admitting: Physical Medicine & Rehabilitation

## 2016-11-22 ENCOUNTER — Ambulatory Visit: Payer: Medicare HMO | Admitting: Physical Medicine & Rehabilitation

## 2016-11-22 ENCOUNTER — Encounter: Payer: Medicare HMO | Attending: Physical Medicine & Rehabilitation

## 2017-09-26 ENCOUNTER — Emergency Department (HOSPITAL_COMMUNITY): Payer: Medicare HMO

## 2017-09-26 ENCOUNTER — Encounter (HOSPITAL_COMMUNITY): Payer: Self-pay

## 2017-09-26 ENCOUNTER — Other Ambulatory Visit: Payer: Self-pay

## 2017-09-26 ENCOUNTER — Observation Stay (HOSPITAL_COMMUNITY)
Admission: EM | Admit: 2017-09-26 | Discharge: 2017-09-27 | Disposition: A | Discharge status: Home / Self Care | Payer: Medicare HMO | Attending: Internal Medicine | Admitting: Internal Medicine

## 2017-09-26 DIAGNOSIS — Z8673 Personal history of transient ischemic attack (TIA), and cerebral infarction without residual deficits: Secondary | ICD-10-CM | POA: Insufficient documentation

## 2017-09-26 DIAGNOSIS — I2510 Atherosclerotic heart disease of native coronary artery without angina pectoris: Secondary | ICD-10-CM | POA: Insufficient documentation

## 2017-09-26 DIAGNOSIS — F1210 Cannabis abuse, uncomplicated: Secondary | ICD-10-CM | POA: Diagnosis not present

## 2017-09-26 DIAGNOSIS — B349 Viral infection, unspecified: Secondary | ICD-10-CM

## 2017-09-26 DIAGNOSIS — N179 Acute kidney failure, unspecified: Secondary | ICD-10-CM | POA: Diagnosis not present

## 2017-09-26 DIAGNOSIS — Z7902 Long term (current) use of antithrombotics/antiplatelets: Secondary | ICD-10-CM | POA: Insufficient documentation

## 2017-09-26 DIAGNOSIS — I5040 Unspecified combined systolic (congestive) and diastolic (congestive) heart failure: Secondary | ICD-10-CM | POA: Diagnosis not present

## 2017-09-26 DIAGNOSIS — Z79899 Other long term (current) drug therapy: Secondary | ICD-10-CM | POA: Insufficient documentation

## 2017-09-26 DIAGNOSIS — Z23 Encounter for immunization: Secondary | ICD-10-CM | POA: Diagnosis not present

## 2017-09-26 DIAGNOSIS — N183 Chronic kidney disease, stage 3 (moderate): Secondary | ICD-10-CM | POA: Diagnosis not present

## 2017-09-26 DIAGNOSIS — Z7982 Long term (current) use of aspirin: Secondary | ICD-10-CM | POA: Insufficient documentation

## 2017-09-26 DIAGNOSIS — R05 Cough: Secondary | ICD-10-CM | POA: Insufficient documentation

## 2017-09-26 DIAGNOSIS — Z87891 Personal history of nicotine dependence: Secondary | ICD-10-CM | POA: Diagnosis not present

## 2017-09-26 DIAGNOSIS — R079 Chest pain, unspecified: Secondary | ICD-10-CM | POA: Diagnosis present

## 2017-09-26 DIAGNOSIS — I130 Hypertensive heart and chronic kidney disease with heart failure and stage 1 through stage 4 chronic kidney disease, or unspecified chronic kidney disease: Secondary | ICD-10-CM | POA: Insufficient documentation

## 2017-09-26 LAB — BASIC METABOLIC PANEL
Anion gap: 13 (ref 5–15)
BUN: 20 mg/dL (ref 6–20)
CALCIUM: 8.5 mg/dL — AB (ref 8.9–10.3)
CO2: 21 mmol/L — ABNORMAL LOW (ref 22–32)
Chloride: 101 mmol/L (ref 101–111)
Creatinine, Ser: 1.84 mg/dL — ABNORMAL HIGH (ref 0.61–1.24)
GFR calc Af Amer: 42 mL/min — ABNORMAL LOW (ref >60)
GFR calc non Af Amer: 36 mL/min — ABNORMAL LOW (ref >60)
Glucose, Bld: 100 mg/dL — ABNORMAL HIGH (ref 65–99)
POTASSIUM: 3.7 mmol/L (ref 3.5–5.1)
SODIUM: 135 mmol/L (ref 135–145)

## 2017-09-26 LAB — CBC WITH DIFFERENTIAL/PLATELET
Basophils Absolute: 0 10*3/uL (ref 0.0–0.1)
Basophils Relative: 0 %
EOS ABS: 0 10*3/uL (ref 0.0–0.7)
EOS PCT: 0 %
HCT: 44.1 % (ref 39.0–52.0)
Hemoglobin: 14.3 g/dL (ref 13.0–17.0)
LYMPHS ABS: 1.6 10*3/uL (ref 0.7–4.0)
Lymphocytes Relative: 22 %
MCH: 27.6 pg (ref 26.0–34.0)
MCHC: 32.4 g/dL (ref 30.0–36.0)
MCV: 85.1 fL (ref 78.0–100.0)
Monocytes Absolute: 0.5 10*3/uL (ref 0.1–1.0)
Monocytes Relative: 7 %
Neutro Abs: 5.1 10*3/uL (ref 1.7–7.7)
Neutrophils Relative %: 71 %
PLATELETS: 93 10*3/uL — AB (ref 150–400)
RBC: 5.18 MIL/uL (ref 4.22–5.81)
RDW: 14 % (ref 11.5–15.5)
WBC: 7.3 10*3/uL (ref 4.0–10.5)

## 2017-09-26 LAB — I-STAT CHEM 8, ED
BUN: 22 mg/dL — ABNORMAL HIGH (ref 6–20)
CREATININE: 1.8 mg/dL — AB (ref 0.61–1.24)
Calcium, Ion: 1.06 mmol/L — ABNORMAL LOW (ref 1.15–1.40)
Chloride: 100 mmol/L — ABNORMAL LOW (ref 101–111)
Glucose, Bld: 103 mg/dL — ABNORMAL HIGH (ref 65–99)
HEMATOCRIT: 43 % (ref 39.0–52.0)
HEMOGLOBIN: 14.6 g/dL (ref 13.0–17.0)
Potassium: 3.6 mmol/L (ref 3.5–5.1)
SODIUM: 137 mmol/L (ref 135–145)
TCO2: 23 mmol/L (ref 22–32)

## 2017-09-26 LAB — TROPONIN I: Troponin I: 0.03 ng/mL (ref <0.03)

## 2017-09-26 LAB — I-STAT TROPONIN, ED: Troponin i, poc: 0.02 ng/mL (ref 0.00–0.08)

## 2017-09-26 MED ORDER — SODIUM CHLORIDE 0.9% FLUSH: 3.0000 mL | INTRAVENOUS | Status: DC | PRN

## 2017-09-26 MED ORDER — PNEUMOCOCCAL VAC POLYVALENT 25 MCG/0.5ML IJ INJ
0.5000 mL | INJECTION | INTRAMUSCULAR | Status: AC
  Administered 2017-09-27: 0.5 mL via INTRAMUSCULAR
  Filled 2017-09-26: qty 0.5

## 2017-09-26 MED ORDER — METOPROLOL TARTRATE 12.5 MG HALF TABLET
12.5000 mg | ORAL_TABLET | Freq: Two times a day (BID) | ORAL | Status: DC
  Administered 2017-09-26 – 2017-09-27 (×2): 12.5 mg via ORAL
  Filled 2017-09-26 (×2): qty 1

## 2017-09-26 MED ORDER — DM-GUAIFENESIN ER 30-600 MG PO TB12
1.0000 | ORAL_TABLET | Freq: Two times a day (BID) | ORAL | Status: DC | PRN
  Administered 2017-09-26 – 2017-09-27 (×2): 1 via ORAL
  Filled 2017-09-26 (×2): qty 1

## 2017-09-26 MED ORDER — ACETAMINOPHEN 325 MG PO TABS: 650.0000 mg | ORAL_TABLET | Freq: Four times a day (QID) | ORAL | Status: DC | PRN

## 2017-09-26 MED ORDER — TRAMADOL HCL 50 MG PO TABS: 50.0000 mg | ORAL_TABLET | Freq: Three times a day (TID) | ORAL | Status: DC | PRN

## 2017-09-26 MED ORDER — ONDANSETRON HCL 4 MG/2ML IJ SOLN: 4.0000 mg | Freq: Four times a day (QID) | INTRAMUSCULAR | Status: DC | PRN

## 2017-09-26 MED ORDER — OXYCODONE-ACETAMINOPHEN 5-325 MG PO TABS
1.0000 | ORAL_TABLET | ORAL | Status: DC | PRN
  Administered 2017-09-26 – 2017-09-27 (×2): 1 via ORAL
  Filled 2017-09-26 (×2): qty 1

## 2017-09-26 MED ORDER — ACETAMINOPHEN 650 MG RE SUPP: 650.0000 mg | Freq: Four times a day (QID) | RECTAL | Status: DC | PRN

## 2017-09-26 MED ORDER — SIMVASTATIN 40 MG PO TABS
40.0000 mg | ORAL_TABLET | Freq: Every day | ORAL | Status: DC
  Administered 2017-09-26: 40 mg via ORAL
  Filled 2017-09-26: qty 1

## 2017-09-26 MED ORDER — CLOPIDOGREL BISULFATE 75 MG PO TABS
75.0000 mg | ORAL_TABLET | Freq: Every day | ORAL | Status: DC
  Administered 2017-09-26: 75 mg via ORAL
  Filled 2017-09-26: qty 1

## 2017-09-26 MED ORDER — ASPIRIN EC 325 MG PO TBEC: 325.0000 mg | DELAYED_RELEASE_TABLET | Freq: Every day | ORAL | Status: DC

## 2017-09-26 MED ORDER — LORATADINE 10 MG PO TABS
10.0000 mg | ORAL_TABLET | Freq: Every day | ORAL | Status: DC
  Administered 2017-09-27: 10 mg via ORAL
  Filled 2017-09-26: qty 1

## 2017-09-26 MED ORDER — ONDANSETRON HCL 4 MG PO TABS: 4.0000 mg | ORAL_TABLET | Freq: Three times a day (TID) | ORAL | Status: DC | PRN

## 2017-09-26 MED ORDER — TIZANIDINE HCL 4 MG PO TABS
4.0000 mg | ORAL_TABLET | Freq: Three times a day (TID) | ORAL | Status: DC | PRN
  Filled 2017-09-26: qty 1

## 2017-09-26 MED ORDER — GABAPENTIN 300 MG PO CAPS
300.0000 mg | ORAL_CAPSULE | Freq: Two times a day (BID) | ORAL | Status: DC
  Administered 2017-09-27: 300 mg via ORAL
  Filled 2017-09-26: qty 1

## 2017-09-26 MED ORDER — TAMSULOSIN HCL 0.4 MG PO CAPS
0.4000 mg | ORAL_CAPSULE | Freq: Every day | ORAL | Status: DC
  Administered 2017-09-26: 0.4 mg via ORAL
  Filled 2017-09-26: qty 1

## 2017-09-26 MED ORDER — SODIUM CHLORIDE 0.9% FLUSH
3.0000 mL | Freq: Two times a day (BID) | INTRAVENOUS | Status: DC
  Administered 2017-09-26: 3 mL via INTRAVENOUS

## 2017-09-26 MED ORDER — PANTOPRAZOLE SODIUM 40 MG PO TBEC
40.0000 mg | DELAYED_RELEASE_TABLET | Freq: Every day | ORAL | Status: DC
  Administered 2017-09-26 – 2017-09-27 (×2): 40 mg via ORAL
  Filled 2017-09-26 (×2): qty 1

## 2017-09-26 MED ORDER — DEXTROSE-NACL 5-0.9 % IV SOLN
INTRAVENOUS | Status: DC
  Administered 2017-09-26: 21:00:00 via INTRAVENOUS

## 2017-09-26 MED ORDER — ONDANSETRON HCL 4 MG PO TABS: 4.0000 mg | ORAL_TABLET | Freq: Four times a day (QID) | ORAL | Status: DC | PRN

## 2017-09-26 MED ORDER — ENOXAPARIN SODIUM 40 MG/0.4ML ~~LOC~~ SOLN: 40.0000 mg | SUBCUTANEOUS | Status: DC

## 2017-09-26 MED ORDER — SODIUM CHLORIDE 0.9 % IV SOLN: 250.0000 mL | INTRAVENOUS | Status: DC | PRN

## 2017-09-26 MED ORDER — ZOLPIDEM TARTRATE 5 MG PO TABS: 5.0000 mg | ORAL_TABLET | Freq: Every evening | ORAL | Status: DC | PRN

## 2017-09-26 NOTE — ED Notes (Signed)
Portable XR at bedside

## 2017-09-26 NOTE — H&P (Signed)
.  History and Physical    Marcus [name]:[id number] DOB: 09/09/1951 DOA: 09/26/2017  PCP: Fleet Contras, MD   Patient coming from: Home  Chief Complaint: Chest pain.   HPI: Marcus [name] is a 67 y.o. male with medical history significant of  coronary artery disease, CVA, hypertension and dyslipidemia. Patient presents with 6-day history of chest pain, mainly pleuritic in nature, associated with cough, nonexertional, moderate to severe intensity, no radiation, no improving factors, worse with deep inspiration and coughing. He also mentioned malaise, generalized weakness and subjective fevers. His chronic back pain has been exacerbated. Denies any history of angina, PND, orthopnea or claudication.   ED Course: Patient was was evaluated for chest pain, initial testing negative, glucose troponins and electrocardiogram. Due to his history of coronary artery disease he was referred for further observation.   Review of Systems:  1. General: Positive fevers and chills, no weight gain or weight loss 2. ENT: No runny nose or sore throat, no hearing disturbances 3. Pulmonary: No dyspnea, cough, wheezing, or hemoptysis 4. Cardiovascular: No angina, claudication, lower extremity edema, pnd or orthopnea 5. Gastrointestinal: No nausea or vomiting, positive for diarrhea  6. Hematology: No easy bruisability or frequent infections 7. Urology: No dysuria, hematuria or increased urinary frequency 8. Dermatology: No rashes. 9. Neurology: No seizures or paresthesias 10. Musculoskeletal: No joint pain or deformities  Past Medical History:  Diagnosis Date  . Arthritis   . Cervical spine disease   . CHF (congestive heart failure) (HCC)   . Depression   . GERD (gastroesophageal reflux disease)   . Hyperlipidemia   . Hypertension     Past Surgical History:  Procedure Laterality Date  . breast lump removal    . LEFT HEART CATHETERIZATION WITH CORONARY/GRAFT ANGIOGRAM Right 05/01/2012   Procedure: LEFT HEART CATHETERIZATION WITH Isabel Caprice;  Surgeon: Ricki Rodriguez, MD;  Location: [facility] CATH [facility];  Service: Cardiovascular;  Laterality: Right;  . triple bypass       reports that he has quit smoking. he has never used smokeless tobacco. He reports that he drinks alcohol. He reports that he uses drugs. Drug: Marijuana.  Allergies  Allergen Reactions  . Penicillins Hives and Other (See Comments)    Has patient had a PCN reaction causing immediate rash, facial/tongue/throat swelling, SOB or lightheadedness with hypotension: No Has patient had a PCN reaction causing severe rash involving mucus membranes or skin necrosis: No Has patient had a PCN reaction that required hospitalization No Has patient had a PCN reaction occurring within the last 10 years: No If all of the above answers are "NO", then may proceed with Cephalosporin use.    Family History  Problem Relation Age of Onset  . Kidney disease Mother   . Asthma Mother   . Hypertension Father     Prior to Admission medications   Medication Sig Start Date End Date Taking? Authorizing Provider  aspirin 325 MG EC tablet Take 1 tablet (325 mg total) by mouth at bedtime. 07/18/16   Rai, Delene Ruffini, MD  cetirizine (ZYRTEC) 10 MG tablet Take 10 mg by mouth at bedtime.    [provider]  clopidogrel (PLAVIX) 75 MG tablet Take 1 tablet (75 mg total) by mouth at bedtime. 07/18/16   Rai, Ripudeep Kirtland Bouchard, MD  gabapentin (NEURONTIN) 300 MG capsule Take 300 mg by mouth 2 (two) times daily.     [provider]  indomethacin (INDOCIN) 25 MG capsule Take 25 mg by mouth 3 (three)  times daily as needed for mild pain or moderate pain.    [provider]  lisinopril (PRINIVIL,ZESTRIL) 2.5 MG tablet Take 2.5 mg by mouth at bedtime.    [provider]  metoprolol tartrate (LOPRESSOR) 12.5 mg TABS Take 12.5 mg by mouth 2 (two) times daily.    Orpah Cobb, MD  omeprazole (PRILOSEC) 20 MG capsule Take  20 mg by mouth at bedtime.     [provider]  ondansetron (ZOFRAN) 4 MG tablet Take 1 tablet (4 mg total) by mouth every 8 (eight) hours as needed for nausea or vomiting. 02/19/15   Danelle Berry, PA-C  oxyCODONE-acetaminophen (PERCOCET/ROXICET) 5-325 MG tablet Take 1-2 tablets by mouth every 4 (four) hours as needed for severe pain. 08/16/16   Cheri Fowler, PA-C  simvastatin (ZOCOR) 40 MG tablet Take 1 tablet (40 mg total) by mouth at bedtime. 07/18/16   Rai, Delene Ruffini, MD  tamsulosin (FLOMAX) 0.4 MG CAPS capsule Take 0.4 mg by mouth at bedtime.     [provider]  tiZANidine (ZANAFLEX) 4 MG tablet Take 4 mg by mouth every 8 (eight) hours as needed for muscle spasms.     [provider]  traMADol (ULTRAM) 50 MG tablet Take 50-100 mg by mouth 3 (three) times daily as needed for moderate pain.     [provider]  zolpidem (AMBIEN) 10 MG tablet Take 10 mg by mouth at bedtime as needed for sleep.     [provider]    Physical Exam: Vitals:   09/26/17 1349 09/26/17 1358 09/26/17 1400  BP:   131/79  Pulse:   95  Resp:   12  Temp:   100 F (37.8 C)  TempSrc:   Oral  SpO2: 98%  96%  Weight:  79.4 kg (175 lb)   Height:  5\' 7"  (1.702 m)     Constitutional: NAD, calm, deconditioned Vitals:   09/26/17 1349 09/26/17 1358 09/26/17 1400  BP:   131/79  Pulse:   95  Resp:   12  Temp:   100 F (37.8 C)  TempSrc:   Oral  SpO2: 98%  96%  Weight:  79.4 kg (175 lb)   Height:  5\' 7"  (1.702 m)    Eyes: PERRL, lids and conjunctivae normal Head normocephalic, nose and ears with no deformities.  ENMT: Mucous membranes are dry. Posterior pharynx clear of any exudate or lesions.Normal dentition.  Neck: normal, supple, no masses, no thyromegaly Respiratory: clear auscultation bilaterally, no wheezing, no crackles. Normal respiratory effort. No accessory muscle use.  Cardiovascular: Regular rate and rhythm, no murmurs / rubs / gallops. No extremity edema. 2+  pedal pulses. No carotid bruits.  Abdomen: no tenderness, no masses palpated. No hepatosplenomegaly. Bowel sounds positive.  Musculoskeletal: no clubbing / cyanosis. No joint deformity upper and lower extremities. Good ROM, no contractures. Normal muscle tone.  Skin: no rashes, lesions, ulcers. No induration Neurologic: CN 2-12 grossly intact. Sensation intact, DTR normal. Strength 5/5 in all 4.     Labs on Admission: I have personally reviewed following labs and imaging studies  CBC: Recent Labs  Lab 09/26/17 1356 09/26/17 1410  WBC 7.3  --   NEUTROABS 5.1  --   HGB 14.3 14.6  HCT 44.1 43.0  MCV 85.1  --   PLT 93*  --    Basic Metabolic Panel: Recent Labs  Lab 09/26/17 1356 09/26/17 1410  NA 135 137  K 3.7 3.6  CL 101 100*  CO2 21*  --  GLUCOSE 100* 103*  BUN 20 22*  CREATININE 1.84* 1.80*  CALCIUM 8.5*  --    GFR: Estimated Creatinine Clearance: 40.2 mL/min (A) (by C-G formula based on SCr of 1.8 mg/dL (H)). Liver Function Tests: No results for input(s): AST, ALT, ALKPHOS, BILITOT, PROT, ALBUMIN in the last 168 hours. No results for input(s): LIPASE, AMYLASE in the last 168 hours. No results for input(s): AMMONIA in the last 168 hours. Coagulation Profile: No results for input(s): INR, PROTIME in the last 168 hours. Cardiac Enzymes: No results for input(s): CKTOTAL, CKMB, CKMBINDEX, TROPONINI in the last 168 hours. BNP (last 3 results) No results for input(s): PROBNP in the last 8760 hours. HbA1C: No results for input(s): HGBA1C in the last 72 hours. CBG: No results for input(s): GLUCAP in the last 168 hours. Lipid Profile: No results for input(s): CHOL, HDL, LDLCALC, TRIG, CHOLHDL, LDLDIRECT in the last 72 hours. Thyroid Function Tests: No results for input(s): TSH, T4TOTAL, FREET4, T3FREE, THYROIDAB in the last 72 hours. Anemia Panel: No results for input(s): VITAMINB12, FOLATE, FERRITIN, TIBC, IRON, RETICCTPCT in the last 72 hours. Urine analysis:      Component Value Date/Time   COLORURINE YELLOW 09/12/2009 1111   APPEARANCEUR CLEAR 09/12/2009 1111   LABSPEC 1.020 09/12/2009 1111   PHURINE 7.0 09/12/2009 1111   GLUCOSEU NEGATIVE 09/12/2009 1111   HGBUR NEGATIVE 09/12/2009 1111   BILIRUBINUR NEGATIVE 09/12/2009 1111   KETONESUR NEGATIVE 09/12/2009 1111   PROTEINUR NEGATIVE 09/12/2009 1111   UROBILINOGEN 0.2 09/12/2009 1111   NITRITE NEGATIVE 09/12/2009 1111   LEUKOCYTESUR  09/12/2009 1111    NEGATIVE MICROSCOPIC NOT DONE ON URINES WITH NEGATIVE PROTEIN, BLOOD, LEUKOCYTES, NITRITE, OR GLUCOSE <1000 mg/dL.    Radiological Exams on Admission: Dg Chest Port 1 View  Result Date: 09/26/2017 CLINICAL DATA:  Chest pain EXAM: PORTABLE CHEST 1 VIEW COMPARISON:  09/22/2015 FINDINGS: Post sternotomy changes. No consolidation or effusion. Cardiomediastinal silhouette within normal limits. No pneumothorax. IMPRESSION: No active disease. Electronically Signed   By: Jasmine Pang M.D.   On: 09/26/2017 14:16    EKG: Independently reviewed. Sinus rhythm, normal axis, normal intervals, inferior and precordial T-wave inversions. Chronic changes.  Assessment/Plan Active Problems:   Chest pain  This is a 67 year old male who has significant history for coronary artery disease who presents with nonspecific chest pain, his pain is mainly pleuritic in nature, nonexertional, otherwise there is no features of angina. He does have generalized malaise, subjective fevers, and diarrhea. On initial physical examination his temperature was 100, blood pressure 128/76, heart rate 95, respiratory 14, oxygen saturation 95%. Dry mucous membranes, lungs clear to auscultation, heart S1-S2 present rhythmic, abdomen soft, no lower extremity edema. Sodium 135, potassium 3.7, chloride 11, bicarbonate 21, glucose 100, BUN 20, creatinine 1.8, white count 7.3, hemoglobin 14.3, hematocrit 44.1, platelets 93. Troponin 0.02 Chest x-ray clear for infiltrates.  Patient will be  admitted for observation under the diagnosis of atypical chest pain rule out acute coronary syndrome, possible setting of viral syndrome.  1. Atypical chest pain. Patient will be admitted to the medical unit, remote telemetry monitor, serial cardiac enzymes and serial EKGs. Continue aspirin, clopidogrel,  metoprolol. Simvastatin.  2. Viral syndrome. Continue supportive therapy, gentle IV fluids. If temperature rate in 100.4, will pursue influenza testing.  3. Hypertension. Continue metoprolol, hold lisinopril. Systolic blood pressure 128. Gentle IV fluids, avoid volume overload.  4. Chronic kidney disease stage III.  Baseline creatinine 1.5, calculated GFR 47. Will continue gentle hydration, hold lisinopril, stop  indomethacin. Follow-up kidney function in the morning, avoid hypotension or nephrotoxic agents.  5. Dyslipidemia. Continue simvastatin.    DVT prophylaxis:  enpxaparin Code Status: full  Family Communication: no family at bedside  Disposition Plan: home  Consults called: none  Admission status: observation    Ellwyn Ergle Annett Gulaaniel Modene Andy MD [facility] Pager [phone]- [phone]  If 7PM-7AM, please contact night-coverage [email or web address] Password [email or web address]  09/26/2017, 3:47 PM

## 2017-09-26 NOTE — ED Triage Notes (Signed)
Pt BIB in by [facility] from home for chest pain that started on Tuesday of this week. Pt states he has also endorses SOB, NVD, cough, chills, and no appetite. 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 Pt given 324mg  aspirin during transport to hospital. Pt rates CP 8/10. Pt states pain worsens with movement.

## 2017-09-26 NOTE — Plan of Care (Signed)
Pt oriented to unit, plan of care, and method of reporting concerns. Educated on use of call light system. Instructed to call for assistance prior to ambulation. Bed alarm implemented for safety. Will continue to monitor patient closely

## 2017-09-26 NOTE — ED Notes (Signed)
Attempted to call report x 1  

## 2017-09-26 NOTE — ED Provider Notes (Addendum)
[facility] Note   CSN: [id number] Arrival date & time: 09/26/17  1347     History   Chief Complaint Chief Complaint  Patient presents with  . Chest Pain    HPI Raliegh ScarletJames J Crowson is a 67 y.o. male.  67 year old male presents with complaint of chest pain.  Patient reports a prior history of triple bypass about 10 years previously.  He also reports a history of hypertension, hyperlipidemia, and CKD.  He reports intermittent chest pain for the last 2-3 days.  Pain was worse today and so he came to the ED.  He feels improved upon evaluation in the ED.   The history is provided by the patient.  Chest Pain   This is a new problem. The current episode started 2 days ago. The problem occurs every several days. The problem has been gradually worsening. The pain is associated with coughing. The pain is present in the substernal region. The pain is mild. The quality of the pain is described as brief. The pain does not radiate. Duration of episode(s) is 5 minutes. Associated symptoms include cough.    Past Medical History:  Diagnosis Date  . Arthritis   . Cervical spine disease   . CHF (congestive heart failure) (HCC)   . Depression   . GERD (gastroesophageal reflux disease)   . Hyperlipidemia   . Hypertension     Patient Active Problem List   Diagnosis Date Noted  . Cerebrovascular accident (CVA) (HCC)   . Acute combined systolic and diastolic CHF, NYHA class 3 (HCC) 07/16/2016  . Acute cerebral infarction (HCC) 07/16/2016  . Benign essential HTN 07/16/2016  . HLD (hyperlipidemia) 07/16/2016  . Non compliance w medication regimen 07/16/2016  . CKD (chronic kidney disease) stage 3, GFR 30-59 ml/min (HCC) 07/16/2016  . CVA (cerebral vascular accident) (HCC) 07/16/2016  . Chest pain at rest 07/06/2014  . Hypertension 03/11/2011  . CAD (coronary artery disease) 03/11/2011    Past Surgical History:  Procedure Laterality Date  .  breast lump removal    . LEFT HEART CATHETERIZATION WITH CORONARY/GRAFT ANGIOGRAM Right 05/01/2012   Procedure: LEFT HEART CATHETERIZATION WITH Isabel CapriceORONARY/GRAFT ANGIOGRAM;  Surgeon: Ricki RodriguezAjay S Kadakia, MD;  Location: [facility] CATH LAB;  Service: Cardiovascular;  Laterality: Right;  . triple bypass         Home Medications    Prior to Admission medications   Medication Sig Start Date End Date Taking? Authorizing Provider  aspirin 325 MG EC tablet Take 1 tablet (325 mg total) by mouth at bedtime. 07/18/16   Rai, Delene Ruffiniipudeep K, MD  cetirizine (ZYRTEC) 10 MG tablet Take 10 mg by mouth at bedtime.    [provider]  clopidogrel (PLAVIX) 75 MG tablet Take 1 tablet (75 mg total) by mouth at bedtime. 07/18/16   Rai, Ripudeep Kirtland BouchardK, MD  gabapentin (NEURONTIN) 300 MG capsule Take 300 mg by mouth 2 (two) times daily.     [provider]  indomethacin (INDOCIN) 25 MG capsule Take 25 mg by mouth 3 (three) times daily as needed for mild pain or moderate pain.    [provider]  lisinopril (PRINIVIL,ZESTRIL) 2.5 MG tablet Take 2.5 mg by mouth at bedtime.    [provider]  metoprolol tartrate (LOPRESSOR) 12.5 mg TABS Take 12.5 mg by mouth 2 (two) times daily.    Orpah CobbKadakia, Ajay, MD  omeprazole (PRILOSEC) 20 MG capsule Take 20 mg by mouth at bedtime.     [provider]  ondansetron (ZOFRAN) 4 MG tablet Take 1 tablet (4 mg total) by mouth every 8 (eight) hours as needed for nausea or vomiting. 02/19/15   Danelle Berry, PA-C  oxyCODONE-acetaminophen (PERCOCET/ROXICET) 5-325 MG tablet Take 1-2 tablets by mouth every 4 (four) hours as needed for severe pain. 08/16/16   Cheri Fowler, PA-C  simvastatin (ZOCOR) 40 MG tablet Take 1 tablet (40 mg total) by mouth at bedtime. 07/18/16   Rai, Delene Ruffini, MD  tamsulosin (FLOMAX) 0.4 MG CAPS capsule Take 0.4 mg by mouth at bedtime.     [provider]  tiZANidine (ZANAFLEX) 4 MG tablet Take 4 mg by mouth every 8 (eight) hours as needed for  muscle spasms.     [provider]  traMADol (ULTRAM) 50 MG tablet Take 50-100 mg by mouth 3 (three) times daily as needed for moderate pain.     [provider]  zolpidem (AMBIEN) 10 MG tablet Take 10 mg by mouth at bedtime as needed for sleep.     [provider]    Family History Family History  Problem Relation Age of Onset  . Kidney disease Mother   . Asthma Mother   . Hypertension Father     Social History Social History   Tobacco Use  . Smoking status: Former Games developer  . Smokeless tobacco: Never Used  Substance Use Topics  . Alcohol use: Yes    Comment: occ  . Drug use: Yes    Types: Marijuana    Comment: "every now and then" last used 09/09/13     Allergies   Penicillins   Review of Systems Review of Systems  Respiratory: Positive for cough.   Cardiovascular: Positive for chest pain.  All other systems reviewed and are negative.    Physical Exam Updated Vital Signs BP 131/79   Pulse 95   Temp 100 F (37.8 C) (Oral)   Resp 12   Ht 5\' 7"  (1.702 m)   Wt 79.4 kg (175 lb)   SpO2 96%   BMI 27.41 kg/m   Physical Exam  Constitutional: He is oriented to person, place, and time. He appears well-developed and well-nourished. No distress.  HENT:  Head: Normocephalic and atraumatic.  Mouth/Throat: Oropharynx is clear and moist.  Eyes: Conjunctivae and EOM are normal. Pupils are equal, round, and reactive to light.  Neck: Normal range of motion. Neck supple.  Cardiovascular: Normal rate, regular rhythm and normal heart sounds.  Pulmonary/Chest: Effort normal and breath sounds normal. No respiratory distress.  Abdominal: Soft. He exhibits no distension. There is no tenderness.  Musculoskeletal: Normal range of motion. He exhibits no edema or deformity.  Neurological: He is alert and oriented to person, place, and time.  Skin: Skin is warm and dry.  Psychiatric: He has a normal mood and affect.  Nursing note and vitals  reviewed.    ED Treatments / Results  Labs (all labs ordered are listed, but only abnormal results are displayed) Labs Reviewed  CBC WITH DIFFERENTIAL/PLATELET - Abnormal; Notable for the following components:      Result Value   Platelets 93 (*)    All other components within normal limits  BASIC METABOLIC PANEL - Abnormal; Notable for the following components:   CO2 21 (*)    Glucose, Bld 100 (*)    Creatinine, Ser 1.84 (*)    Calcium 8.5 (*)    GFR calc non Af Amer 36 (*)    GFR calc Af Amer 42 (*)  All other components within normal limits  I-STAT CHEM 8, ED - Abnormal; Notable for the following components:   Chloride 100 (*)    BUN 22 (*)    Creatinine, Ser 1.80 (*)    Glucose, Bld 103 (*)    Calcium, Ion 1.06 (*)    All other components within normal limits  I-STAT TROPONIN, ED    EKG  EKG Interpretation  Date/Time:  Friday September 26 2017 13:53:46 EST Ventricular Rate:  95 PR Interval:    QRS Duration: 109 QT Interval:  414 QTC Calculation: 521 R Axis:   62 Text Interpretation:  Sinus rhythm Nonspecific T abnormalities, diffuse leads Prolonged QT interval Confirmed by Kristine Royal [phone]) on 09/26/2017 4:18:50 PM       Radiology Dg Chest Port 1 View  Result Date: 09/26/2017 CLINICAL DATA:  Chest pain EXAM: PORTABLE CHEST 1 VIEW COMPARISON:  09/22/2015 FINDINGS: Post sternotomy changes. No consolidation or effusion. Cardiomediastinal silhouette within normal limits. No pneumothorax. IMPRESSION: No active disease. Electronically Signed   By: Jasmine Pang M.D.   On: 09/26/2017 14:16    Procedures Procedures (including critical care time)  Medications Ordered in ED Medications - No data to display   Initial Impression / Assessment and Plan / ED Course  I have reviewed the triage vital signs and the nursing notes.  Pertinent labs & imaging results that were available during my care of the patient were reviewed by me and considered in my medical  decision making (see chart for details).  Clinical Course as of Sep 27 1328  Fri Sep 26, 2017  1639 Monocytes Relative: 7 [PM]    Clinical Course User Index [PM] Wynetta Fines, MD    MDM screen complete  Patient is presenting with chest pain.  His presentation is somewhat atypical for ACS.  However patient has extensive history of CAD and a reported history of noncompliance.  Will admit patient for further workup and rule out.  Final Clinical Impressions(s) / ED Diagnoses   Final diagnoses:  Chest pain, unspecified type    ED Discharge Orders    None       Wynetta Fines, MD 09/26/17 1620    Wynetta Fines, MD 09/27/17 1331

## 2017-09-27 DIAGNOSIS — I5040 Unspecified combined systolic (congestive) and diastolic (congestive) heart failure: Secondary | ICD-10-CM | POA: Diagnosis not present

## 2017-09-27 DIAGNOSIS — R079 Chest pain, unspecified: Secondary | ICD-10-CM | POA: Diagnosis not present

## 2017-09-27 DIAGNOSIS — N183 Chronic kidney disease, stage 3 (moderate): Secondary | ICD-10-CM

## 2017-09-27 DIAGNOSIS — R05 Cough: Secondary | ICD-10-CM | POA: Diagnosis not present

## 2017-09-27 DIAGNOSIS — B349 Viral infection, unspecified: Secondary | ICD-10-CM | POA: Diagnosis not present

## 2017-09-27 DIAGNOSIS — I130 Hypertensive heart and chronic kidney disease with heart failure and stage 1 through stage 4 chronic kidney disease, or unspecified chronic kidney disease: Secondary | ICD-10-CM | POA: Diagnosis not present

## 2017-09-27 LAB — BASIC METABOLIC PANEL
Anion gap: 13 (ref 5–15)
BUN: 19 mg/dL (ref 6–20)
CO2: 21 mmol/L — ABNORMAL LOW (ref 22–32)
CREATININE: 1.74 mg/dL — AB (ref 0.61–1.24)
Calcium: 8 mg/dL — ABNORMAL LOW (ref 8.9–10.3)
Chloride: 102 mmol/L (ref 101–111)
GFR calc Af Amer: 45 mL/min — ABNORMAL LOW (ref >60)
GFR calc non Af Amer: 39 mL/min — ABNORMAL LOW (ref >60)
GLUCOSE: 118 mg/dL — AB (ref 65–99)
POTASSIUM: 3.6 mmol/L (ref 3.5–5.1)
Sodium: 136 mmol/L (ref 135–145)

## 2017-09-27 LAB — TROPONIN I
Troponin I: <0.03 ng/mL (ref <0.03)
Troponin I: <0.03 ng/mL (ref <0.03)

## 2017-09-27 MED ORDER — GUAIFENESIN 100 MG/5ML PO SOLN: 5.0000 mL | ORAL | Status: DC | PRN

## 2017-09-27 MED ORDER — ONDANSETRON HCL 4 MG PO TABS: 4.0000 mg | ORAL_TABLET | ORAL | Status: DC | PRN

## 2017-09-27 MED ORDER — GUAIFENESIN 100 MG/5ML PO SOLN: 5.0000 mL | ORAL | 0 refills | Status: DC | PRN

## 2017-09-27 NOTE — Discharge Summary (Signed)
Physician Discharge Summary  Marcus [name]:[id number] DOB: 1951/01/20 DOA: 09/26/2017  PCP: Fleet Contras, MD  Admit date: 09/26/2017 Discharge date: 09/27/2017  Admitted From: Home Disposition:  Home  Recommendations for Outpatient Follow-up:  1. Follow up with PCP in 1- weeks.  Home Health: NA Equipment/Devices: None    Discharge Condition: Stable CODE STATUS: full  Diet recommendation: Heart healthy  Brief/Interim Summary: This is a 67 year old male who has significant history for coronary artery disease who presents with nonspecific chest pain, his pain was mainly pleuritic in nature, nonexertional, otherwise there was no features of angina. He does have generalized malaise, subjective fevers, and diarrhea. On initial physical examination his temperature was 100, blood pressure 128/76, heart rate 95, respiratory 14, oxygen saturation 95%. Dry mucous membranes, lungs clear to auscultation, heart S1-S2 present rhythmic, abdomen soft, no lower extremity edema. Sodium 135, potassium 3.7, chloride 100, bicarbonate 21, glucose 100, BUN 20, creatinine 1.8, white count 7.3, hemoglobin 14.3, hematocrit 44.1, platelets 93. Troponin 0.02 Chest x-ray clear for infiltrates.  Patient was admitted for observation under the diagnosis of atypical chest pain rule out acute coronary syndrome, possible setting of viral syndrome.  1. Atypical chest pain, ruled out for acute coronary syndrome. Patient was admitted to the medical unit, he was placed on remote telemetry monitoring, serial cardiac enzymes were negative. Patient will continue clopidogrel, metoprolol and simvastatin.  2. Acute kidney injury on chronic kidney disease stage III.  Admission creatinine 1.8, baseline 1.5, patient received IV fluids. ACE inhibitor and indomethacin were discontinued. Follow-up kidney function with cr down to 1,7, clinically improving.   3. Viral syndrome. Very likely the culprit of his current symptoms patient  has remained afebrile, will recommend continue supportive therapy.  4. Hypertension. Patient received metoprolol, ACE inhibitor was held during his hospitalization, he will resume lisinopril discharge.     Discharge Diagnoses:  Active Problems:   Chest pain    Discharge Instructions   Allergies as of 09/27/2017      Reactions   Penicillins Hives, Other (See Comments)   Has patient had a PCN reaction causing immediate rash, facial/tongue/throat swelling, SOB or lightheadedness with hypotension: No Has patient had a PCN reaction causing severe rash involving mucus membranes or skin necrosis: No Has patient had a PCN reaction that required hospitalization No Has patient had a PCN reaction occurring within the last 10 years: No If all of the above answers are "NO", then may proceed with Cephalosporin use.      Medication List    STOP taking these medications   indomethacin 25 MG capsule Commonly known as:  INDOCIN     TAKE these medications   cetirizine 10 MG tablet Commonly known as:  ZYRTEC Take 10 mg by mouth as needed.   citalopram 10 MG tablet Commonly known as:  CELEXA Take 10 mg by mouth at bedtime.   clopidogrel 75 MG tablet Commonly known as:  PLAVIX Take 1 tablet (75 mg total) by mouth at bedtime.   guaiFENesin 100 MG/5ML Soln Commonly known as:  ROBITUSSIN Take 5 mLs (100 mg total) by mouth every 4 (four) hours as needed for cough or to loosen phlegm.   lisinopril 2.5 MG tablet Commonly known as:  PRINIVIL,ZESTRIL Take 2.5 mg by mouth at bedtime.   metoprolol tartrate 12.5 mg Tabs tablet Commonly known as:  LOPRESSOR Take 12.5 mg by mouth 2 (two) times daily.   omeprazole 20 MG capsule Commonly known as:  PRILOSEC Take 20 mg by mouth  at bedtime.   ondansetron 4 MG tablet Commonly known as:  ZOFRAN Take 1 tablet (4 mg total) by mouth every 8 (eight) hours as needed for nausea or vomiting.   simvastatin 40 MG tablet Commonly known as:  ZOCOR Take  1 tablet (40 mg total) by mouth at bedtime.   tamsulosin 0.4 MG Caps capsule Commonly known as:  FLOMAX Take 0.4 mg by mouth at bedtime.   tiZANidine 4 MG tablet Commonly known as:  ZANAFLEX Take 4 mg by mouth every 8 (eight) hours as needed for muscle spasms.   zolpidem 10 MG tablet Commonly known as:  AMBIEN Take 10 mg by mouth at bedtime as needed for sleep.       Allergies  Allergen Reactions  . Penicillins Hives and Other (See Comments)    Has patient had a PCN reaction causing immediate rash, facial/tongue/throat swelling, SOB or lightheadedness with hypotension: No Has patient had a PCN reaction causing severe rash involving mucus membranes or skin necrosis: No Has patient had a PCN reaction that required hospitalization No Has patient had a PCN reaction occurring within the last 10 years: No If all of the above answers are "NO", then may proceed with Cephalosporin use.    Consultations:    Procedures/Studies: Dg Chest Port 1 View  Result Date: 09/26/2017 CLINICAL DATA:  Chest pain EXAM: PORTABLE CHEST 1 VIEW COMPARISON:  09/22/2015 FINDINGS: Post sternotomy changes. No consolidation or effusion. Cardiomediastinal silhouette within normal limits. No pneumothorax. IMPRESSION: No active disease. Electronically Signed   By: Jasmine PangKim  Fujinaga M.D.   On: 09/26/2017 14:16       Subjective: Patient with positive cough and malaise, no further chest pain. Positive chronic back pain.   Discharge Exam: Vitals:   09/26/17 2022 09/27/17 0534  BP: (!) 143/70 118/80  Pulse: (!) 102 89  Resp: 19 18  Temp: 98.5 F (36.9 C) 98.8 F (37.1 C)  SpO2: 95%    Vitals:   09/26/17 1745 09/26/17 1915 09/26/17 2022 09/27/17 0534  BP: 133/77 120/78 (!) 143/70 118/80  Pulse: 90 96 (!) 102 89  Resp: (!) 21 (!) 24 19 18   Temp:   98.5 F (36.9 C) 98.8 F (37.1 C)  TempSrc:   Oral Oral  SpO2: 96% 94% 95%   Weight:   75.3 kg (166 lb) 74.2 kg (163 lb 8 oz)  Height:   5\' 7"  (1.702 m)      General: Pt is alert, awake, not in acute distress E ENT; no pallor, oral mucosa moist.  Cardiovascular: RRR, S1/S2 +, no rubs, no gallops Respiratory: CTA bilaterally, no wheezing, no rhonchi Abdominal: Soft, NT, ND, bowel sounds + Extremities: no edema, no cyanosis    The results of significant diagnostics from this hospitalization (including imaging, microbiology, ancillary and laboratory) are listed below for reference.     Microbiology: No results found for this or any previous visit (from the past 240 hour(s)).   Labs: BNP (last 3 results) No results for input(s): BNP in the last 8760 hours. Basic Metabolic Panel: Recent Labs  Lab 09/26/17 1356 09/26/17 1410  NA 135 137  K 3.7 3.6  CL 101 100*  CO2 21*  --   GLUCOSE 100* 103*  BUN 20 22*  CREATININE 1.84* 1.80*  CALCIUM 8.5*  --    Liver Function Tests: No results for input(s): AST, ALT, ALKPHOS, BILITOT, PROT, ALBUMIN in the last 168 hours. No results for input(s): LIPASE, AMYLASE in the last 168 hours. No  results for input(s): AMMONIA in the last 168 hours. CBC: Recent Labs  Lab 09/26/17 1356 09/26/17 1410  WBC 7.3  --   NEUTROABS 5.1  --   HGB 14.3 14.6  HCT 44.1 43.0  MCV 85.1  --   PLT 93*  --    Cardiac Enzymes: Recent Labs  Lab 09/26/17 2125 09/27/17 0238 09/27/17 0750  TROPONINI 0.03* <0.03 <0.03   BNP: Invalid input(s): POCBNP CBG: No results for input(s): GLUCAP in the last 168 hours. D-Dimer No results for input(s): DDIMER in the last 72 hours. Hgb A1c No results for input(s): HGBA1C in the last 72 hours. Lipid Profile No results for input(s): CHOL, HDL, LDLCALC, TRIG, CHOLHDL, LDLDIRECT in the last 72 hours. Thyroid function studies No results for input(s): TSH, T4TOTAL, T3FREE, THYROIDAB in the last 72 hours.  Invalid input(s): FREET3 Anemia work up No results for input(s): VITAMINB12, FOLATE, FERRITIN, TIBC, IRON, RETICCTPCT in the last 72 hours. Urinalysis     Component Value Date/Time   COLORURINE YELLOW 09/12/2009 1111   APPEARANCEUR CLEAR 09/12/2009 1111   LABSPEC 1.020 09/12/2009 1111   PHURINE 7.0 09/12/2009 1111   GLUCOSEU NEGATIVE 09/12/2009 1111   HGBUR NEGATIVE 09/12/2009 1111   BILIRUBINUR NEGATIVE 09/12/2009 1111   KETONESUR NEGATIVE 09/12/2009 1111   PROTEINUR NEGATIVE 09/12/2009 1111   UROBILINOGEN 0.2 09/12/2009 1111   NITRITE NEGATIVE 09/12/2009 1111   LEUKOCYTESUR  09/12/2009 1111    NEGATIVE MICROSCOPIC NOT DONE ON URINES WITH NEGATIVE PROTEIN, BLOOD, LEUKOCYTES, NITRITE, OR GLUCOSE <1000 mg/dL.   Sepsis Labs Invalid input(s): PROCALCITONIN,  WBC,  LACTICIDVEN Microbiology No results found for this or any previous visit (from the past 240 hour(s)).   Time coordinating discharge: 45 minutes  SIGNED:   Coralie Keens, MD  [facility] 09/27/2017, 11:47 AM Pager [phone]  If 7PM-7AM, please contact night-coverage [email or web address] Password [email or web address]

## 2018-02-11 ENCOUNTER — Other Ambulatory Visit: Payer: Self-pay | Admitting: Cardiovascular Disease

## 2018-02-11 DIAGNOSIS — R252 Cramp and spasm: Secondary | ICD-10-CM

## 2018-02-12 ENCOUNTER — Ambulatory Visit (HOSPITAL_BASED_OUTPATIENT_CLINIC_OR_DEPARTMENT_OTHER)
Admission: RE | Admit: 2018-02-12 | Discharge: 2018-02-12 | Disposition: A | Discharge status: Home / Self Care | Payer: Medicare HMO | Source: Ambulatory Visit | Attending: Cardiovascular Disease | Admitting: Cardiovascular Disease

## 2018-02-12 ENCOUNTER — Ambulatory Visit (HOSPITAL_COMMUNITY)
Admission: RE | Admit: 2018-02-12 | Discharge: 2018-02-12 | Disposition: A | Discharge status: Home / Self Care | Payer: Medicare HMO | Source: Ambulatory Visit | Attending: Internal Medicine | Admitting: Internal Medicine

## 2018-02-12 ENCOUNTER — Encounter (HOSPITAL_COMMUNITY): Payer: Medicare HMO

## 2018-02-12 ENCOUNTER — Other Ambulatory Visit: Payer: Self-pay | Admitting: Cardiovascular Disease

## 2018-02-12 DIAGNOSIS — R52 Pain, unspecified: Secondary | ICD-10-CM

## 2018-02-12 DIAGNOSIS — I708 Atherosclerosis of other arteries: Secondary | ICD-10-CM | POA: Insufficient documentation

## 2018-02-12 DIAGNOSIS — R252 Cramp and spasm: Secondary | ICD-10-CM

## 2018-02-12 NOTE — Progress Notes (Signed)
ABI's have been completed. Right 0.7 Left 1.11  02/12/18 3:36 PM Olen CordialGreg Anglea Gordner RVT

## 2018-02-17 ENCOUNTER — Ambulatory Visit: Payer: Self-pay | Admitting: Podiatry

## 2018-03-11 ENCOUNTER — Encounter: Payer: Self-pay | Admitting: Podiatry

## 2018-03-16 ENCOUNTER — Ambulatory Visit (INDEPENDENT_AMBULATORY_CARE_PROVIDER_SITE_OTHER): Payer: Medicare HMO | Admitting: Podiatry

## 2018-03-16 DIAGNOSIS — M19071 Primary osteoarthritis, right ankle and foot: Secondary | ICD-10-CM

## 2018-03-16 DIAGNOSIS — L600 Ingrowing nail: Secondary | ICD-10-CM | POA: Diagnosis not present

## 2018-03-16 MED ORDER — GENTAMICIN SULFATE 0.1 % EX CREA: 1.0000 "application " | TOPICAL_CREAM | Freq: Two times a day (BID) | CUTANEOUS | 1 refills | Status: DC

## 2018-03-16 NOTE — Patient Instructions (Signed)

## 2018-03-19 NOTE — Progress Notes (Signed)
   Subjective: Patient presents today for evaluation of pain to the medial border of the right great toenail that began a couple weeks ago. Patient is concerned for possible ingrown nail. He reports h/o gout but states this feels differently. He has not done anything for treatment. Wearing shoes increases the pain. Patient presents today for further treatment and evaluation.  Past Medical History:  Diagnosis Date  . Arthritis   . Cervical spine disease   . CHF (congestive heart failure) (HCC)   . Depression   . GERD (gastroesophageal reflux disease)   . Hyperlipidemia   . Hypertension     Objective:  General: Well developed, nourished, in no acute distress, alert and oriented x3   Dermatology: Skin is warm, dry and supple bilateral. Medial border of the right hallux appears to be erythematous with evidence of an ingrowing nail. Pain on palpation noted to the border of the nail fold. The remaining nails appear unremarkable at this time. There are no open sores, lesions.  Vascular: Dorsalis Pedis artery and Posterior Tibial artery pedal pulses palpable. No lower extremity edema noted.   Neruologic: Grossly intact via light touch bilateral.  Musculoskeletal: Muscular strength within normal limits in all groups bilateral. Normal range of motion noted to all pedal and ankle joints.   Assesement: #1 Paronychia with ingrowing nail medial border right hallux  #2 Pain in toe #3 Incurvated nail  Plan of Care:  1. Patient evaluated.  2. Discussed treatment alternatives and plan of care. Explained nail avulsion procedure and post procedure course to patient. 3. Patient opted for permanent partial nail avulsion.  4. Prior to procedure, local anesthesia infiltration utilized using 3 ml of a 50:50 mixture of 2% plain lidocaine and 0.5% plain marcaine in a normal hallux block fashion and a betadine prep performed.  5. Partial permanent nail avulsion with chemical matrixectomy performed using  3x30sec applications of phenol followed by alcohol flush.  6. Light dressing applied. 7. Prescription for gentamicin cream provided to patient.  8. Return to clinic in 2 weeks.   Felecia ShellingBrent M. Evans, DPM [facility]  Dr. Felecia ShellingBrent M. Evans, DPM    [address]                                        [place], [place] [address]                Office [phone]  Fax [phone]

## 2018-03-24 NOTE — Progress Notes (Signed)
This encounter was created in error - please disregard.

## 2018-05-18 ENCOUNTER — Other Ambulatory Visit: Payer: Self-pay

## 2018-05-18 ENCOUNTER — Ambulatory Visit (INDEPENDENT_AMBULATORY_CARE_PROVIDER_SITE_OTHER): Payer: Medicare HMO | Admitting: Surgery

## 2018-05-18 ENCOUNTER — Encounter: Payer: Self-pay | Admitting: Surgery

## 2018-05-18 VITALS — BP 146/81 | HR 51 | Temp 97.5°F | Resp 16 | Ht 67.0 in | Wt 166.0 lb

## 2018-05-18 DIAGNOSIS — I70211 Atherosclerosis of native arteries of extremities with intermittent claudication, right leg: Secondary | ICD-10-CM | POA: Diagnosis not present

## 2018-05-18 MED ORDER — CILOSTAZOL 100 MG PO TABS: 100.0000 mg | ORAL_TABLET | Freq: Two times a day (BID) | ORAL | 11 refills | Status: DC

## 2018-05-18 NOTE — Progress Notes (Signed)
[facility] [place]  Patient name: Marcus [name] MRN: [id number] DOB: 01/18/1951 Sex: male   REQUESTING PROVIDER:    Dr. Algie Coffer    REASON FOR CONSULT:    Leg cramps with walking  HISTORY OF PRESENT ILLNESS:   Marcus [name] is a 67 y.o. male, who is referred today for evaluation of leg pain.  Patient states that he has been having difficulty approximately 6 months.  He states  that his right calf gets tight at approximately 150 yards.  Is much worse when he walks uphill.  He also has some symptoms in his left leg with are not as bad.  He does not have rest pain.  He does not have any nonhealing wounds.  The patient has a history of coronary artery disease and is status post CABG.  He is medically managed for hypertension.  He has a history of smoking but has quit.  He is on a statin for hypercholesterolemia.   PAST MEDICAL HISTORY    Past Medical History:  Diagnosis Date  . Arthritis   . Cervical spine disease   . CHF (congestive heart failure) (HCC)   . Depression   . GERD (gastroesophageal reflux disease)   . Hyperlipidemia   . Hypertension      FAMILY HISTORY   Family History  Problem Relation Age of Onset  . Kidney disease Mother   . Asthma Mother   . Hypertension Father     SOCIAL HISTORY:   Social History   Socioeconomic History  . Marital status: Married    Spouse name: Not on file  . Number of children: Not on file  . Years of education: Not on file  . Highest education level: Not on file  Occupational History  . Not on file  Social Needs  . Financial resource strain: Not on file  . Food insecurity:    Worry: Not on file    Inability: Not on file  . Transportation needs:    Medical: Not on file    Non-medical: Not on file  Tobacco Use  . Smoking status: Former Games developer  . Smokeless tobacco: Never Used  Substance and Sexual Activity  . Alcohol use: Yes    Comment: occ  . Drug use:  Yes    Types: Marijuana    Comment: "every now and then" last used 09/09/13  . Sexual activity: Not on file  Lifestyle  . Physical activity:    Days per week: Not on file    Minutes per session: Not on file  . Stress: Not on file  Relationships  . Social connections:    Talks on phone: Not on file    Gets together: Not on file    Attends religious service: Not on file    Active member of club or organization: Not on file    Attends meetings of clubs or organizations: Not on file    Relationship status: Not on file  . Intimate partner violence:    Fear of current or ex partner: Not on file    Emotionally abused: Not on file    Physically abused: Not on file    Forced sexual activity: Not on file  Other Topics Concern  . Not on file  Social History Narrative  . Not on file    ALLERGIES:    Allergies  Allergen Reactions  . Penicillins Hives and Other (See Comments)    Has patient had a PCN reaction causing immediate  rash, facial/tongue/throat swelling, SOB or lightheadedness with hypotension: No Has patient had a PCN reaction causing severe rash involving mucus membranes or skin necrosis: No Has patient had a PCN reaction that required hospitalization No Has patient had a PCN reaction occurring within the last 10 years: No If all of the above answers are "NO", then may proceed with Cephalosporin use.    CURRENT MEDICATIONS:    Current Outpatient Medications  Medication Sig Dispense Refill  . cetirizine (ZYRTEC) 10 MG tablet Take 10 mg by mouth as needed.     . citalopram (CELEXA) 10 MG tablet Take 10 mg by mouth at bedtime.  2  . clopidogrel (PLAVIX) 75 MG tablet Take 1 tablet (75 mg total) by mouth at bedtime. 30 tablet 3  . gentamicin cream (GARAMYCIN) 0.1 % Apply 1 application topically 2 (two) times daily. 30 g 1  . guaiFENesin (ROBITUSSIN) 100 MG/5ML SOLN Take 5 mLs (100 mg total) by mouth every 4 (four) hours as needed for cough or to loosen phlegm. 1200 mL 0  .  lisinopril (PRINIVIL,ZESTRIL) 2.5 MG tablet Take 2.5 mg by mouth at bedtime.    . metoprolol tartrate (LOPRESSOR) 12.5 mg TABS Take 12.5 mg by mouth 2 (two) times daily.    Marland Kitchen omeprazole (PRILOSEC) 20 MG capsule Take 20 mg by mouth at bedtime.     . ondansetron (ZOFRAN) 4 MG tablet Take 1 tablet (4 mg total) by mouth every 8 (eight) hours as needed for nausea or vomiting. 10 tablet 0  . simvastatin (ZOCOR) 40 MG tablet Take 1 tablet (40 mg total) by mouth at bedtime. 30 tablet 3  . tamsulosin (FLOMAX) 0.4 MG CAPS capsule Take 0.4 mg by mouth at bedtime.     Marland Kitchen tiZANidine (ZANAFLEX) 4 MG tablet Take 4 mg by mouth every 8 (eight) hours as needed for muscle spasms.   2  . zolpidem (AMBIEN) 10 MG tablet Take 10 mg by mouth at bedtime as needed for sleep.      No current facility-administered medications for this visit.     REVIEW OF SYSTEMS:   [X]  denotes positive finding, [ ]  denotes negative finding Cardiac  Comments:  Chest pain or chest pressure:    Shortness of breath upon exertion:    Short of breath when lying flat:    Irregular heart rhythm:        Vascular    Pain in calf, thigh, or hip brought on by ambulation: x   Pain in feet at night that wakes you up from your sleep:     Blood clot in your veins:    Leg swelling:         Pulmonary    Oxygen at home:    Productive cough:     Wheezing:         Neurologic    Sudden weakness in arms or legs:     Sudden numbness in arms or legs:     Sudden onset of difficulty speaking or slurred speech:    Temporary loss of vision in one eye:     Problems with dizziness:         Gastrointestinal    Blood in stool:      Vomited blood:         Genitourinary    Burning when urinating:     Blood in urine:        Psychiatric    Major depression:         Hematologic  Bleeding problems:    Problems with blood clotting too easily:        Skin    Rashes or ulcers:        Constitutional    Fever or chills:     PHYSICAL EXAM:    Vitals:   05/18/18 1033 05/18/18 1038  BP: (!) 144/85 (!) 146/81  Pulse: (!) 51 (!) 51  Resp: 16   Temp: (!) 97.5 F (36.4 C)   TempSrc: Oral   SpO2: 98%   Weight: 166 lb (75.3 kg)   Height: 5\' 7"  (1.702 m)     GENERAL: The patient is a well-nourished male, in no acute distress. The vital signs are documented above. CARDIAC: There is a regular rate and rhythm.  VASCULAR: Palpable left dorsalis pedis pulse, nonpalpable right PULMONARY: Nonlabored respirations MUSCULOSKELETAL: There are no major deformities or cyanosis. NEUROLOGIC: No focal weakness or paresthesias are detected. SKIN: There are no ulcers or rashes noted. PSYCHIATRIC: The patient has a normal affect.  STUDIES:  I have reviewed the following:   **+-------+-----------+-----------+------------+------------+ ABI/TBIToday's ABIToday's TBIPrevious ABIPrevious TBI +-------+-----------+-----------+------------+------------+ Right 0.7    0.31                 +-------+-----------+-----------+------------+------------+ Left  1.11    0.4                 +-------+-----------+-----------+------------+------------+*  Right: 75-99% stenosis noted in the anterior tibial artery. Total    occlusion noted in the posterior tibial artery. Total    occlusion of the proximal peroneal artery with reconstitution    at the mid and distal levels. Left: There is no evidence of a significant stenosis in the left   lower extremity.  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  By ultrasound, it appears that his disease on the right is tibial disease.  I have recommended that he continue with his regular exercise.  I am starting him on both cilostazol to see if he has any benefit.  I will reevaluate him in 3 months.  I would like to avoid intervention if at  all possible given that this appears to be tibial disease, however if he continues to have the same amount of disability in 3 months, I would consider scheduling angiography.   Durene Cal, MD [facility] [phone] Pager [phone]

## 2018-05-18 NOTE — Progress Notes (Signed)
Vitals:   05/18/18 1033  BP: (!) 144/85  Pulse: (!) 51  Resp: 16  Temp: (!) 97.5 F (36.4 C)  TempSrc: Oral  SpO2: 98%  Weight: 166 lb (75.3 kg)  Height: 5\' 7"  (1.702 m)

## 2018-07-20 ENCOUNTER — Ambulatory Visit: Payer: Medicare HMO

## 2018-07-20 NOTE — Progress Notes (Deleted)
History of Present Illness:  Patient is a 67 y.o. year old male who presents for evaluation of claudication.   Patient states that he has been having difficulty approximately 6 months.  He states  that his right calf gets tight at approximately 150 yards. He was last seen by DR. Brabham on 05/18/2018.  By ultrasound, it appears that his disease on the right is tibial disease.   Dr. Myra Gianotti recommended that he continue with his regular exercise and started him on cilostazol to see if he has any benefit.  He was scheduled for reevaluate him in 3 months.  He is here today with a chief complaint      Past Medical History:  Diagnosis Date  . Arthritis   . Cervical spine disease   . CHF (congestive heart failure) (HCC)   . Depression   . GERD (gastroesophageal reflux disease)   . Hyperlipidemia   . Hypertension     Past Surgical History:  Procedure Laterality Date  . breast lump removal    . LEFT HEART CATHETERIZATION WITH CORONARY/GRAFT ANGIOGRAM Right 05/01/2012   Procedure: LEFT HEART CATHETERIZATION WITH Isabel Caprice;  Surgeon: Ricki Rodriguez, MD;  Location: [facility] CATH LAB;  Service: Cardiovascular;  Laterality: Right;  . triple bypass      ROS:   General:  No weight loss, Fever, chills  HEENT: No recent headaches, no nasal bleeding, no visual changes, no sore throat  Neurologic: No dizziness, blackouts, seizures. No recent symptoms of stroke or mini- stroke. No recent episodes of slurred speech, or temporary blindness.  Cardiac: No recent episodes of chest pain/pressure, no shortness of breath at rest.  No shortness of breath with exertion.  Denies history of atrial fibrillation or irregular heartbeat  Vascular: No history of rest pain in feet.  No history of claudication.  No history of non-healing ulcer, No history of DVT   Pulmonary: No home oxygen, no productive cough, no hemoptysis,  No asthma or wheezing  Musculoskeletal:  [ ]  Arthritis, [ ]  Low back pain,  [  ] Joint pain  Hematologic:No history of hypercoagulable state.  No history of easy bleeding.  No history of anemia  Gastrointestinal: No hematochezia or melena,  No gastroesophageal reflux, no trouble swallowing  Urinary: [ ]  chronic Kidney disease, [ ]  on HD - [ ]  MWF or [ ]  TTHS, [ ]  Burning with urination, [ ]  Frequent urination, [ ]  Difficulty urinating;   Skin: No rashes  Psychological: No history of anxiety,  No history of depression  Social History Social History   Tobacco Use  . Smoking status: Former Games developer  . Smokeless tobacco: Never Used  Substance Use Topics  . Alcohol use: Yes    Comment: occ  . Drug use: Yes    Types: Marijuana    Comment: "every now and then" last used 09/09/13    Family History Family History  Problem Relation Age of Onset  . Kidney disease Mother   . Asthma Mother   . Hypertension Father     Allergies  Allergies  Allergen Reactions  . Penicillins Hives and Other (See Comments)    Has patient had a PCN reaction causing immediate rash, facial/tongue/throat swelling, SOB or lightheadedness with hypotension: No Has patient had a PCN reaction causing severe rash involving mucus membranes or skin necrosis: No Has patient had a PCN reaction that required hospitalization No Has patient had a PCN reaction occurring within the last 10 years: No If all  of the above answers are "NO", then may proceed with Cephalosporin use.     Current Outpatient Medications  Medication Sig Dispense Refill  . cetirizine (ZYRTEC) 10 MG tablet Take 10 mg by mouth as needed.     . cilostazol (PLETAL) 100 MG tablet Take 1 tablet (100 mg total) by mouth 2 (two) times daily before a meal. 60 tablet 11  . citalopram (CELEXA) 10 MG tablet Take 10 mg by mouth at bedtime.  2  . clopidogrel (PLAVIX) 75 MG tablet Take 1 tablet (75 mg total) by mouth at bedtime. 30 tablet 3  . gentamicin cream (GARAMYCIN) 0.1 % Apply 1 application topically 2 (two) times daily. 30 g 1  .  guaiFENesin (ROBITUSSIN) 100 MG/5ML SOLN Take 5 mLs (100 mg total) by mouth every 4 (four) hours as needed for cough or to loosen phlegm. 1200 mL 0  . lisinopril (PRINIVIL,ZESTRIL) 2.5 MG tablet Take 2.5 mg by mouth at bedtime.    . metoprolol tartrate (LOPRESSOR) 12.5 mg TABS Take 12.5 mg by mouth 2 (two) times daily.    Marland Kitchen omeprazole (PRILOSEC) 20 MG capsule Take 20 mg by mouth at bedtime.     . ondansetron (ZOFRAN) 4 MG tablet Take 1 tablet (4 mg total) by mouth every 8 (eight) hours as needed for nausea or vomiting. 10 tablet 0  . simvastatin (ZOCOR) 40 MG tablet Take 1 tablet (40 mg total) by mouth at bedtime. 30 tablet 3  . tamsulosin (FLOMAX) 0.4 MG CAPS capsule Take 0.4 mg by mouth at bedtime.     Marland Kitchen tiZANidine (ZANAFLEX) 4 MG tablet Take 4 mg by mouth every 8 (eight) hours as needed for muscle spasms.   2  . zolpidem (AMBIEN) 10 MG tablet Take 10 mg by mouth at bedtime as needed for sleep.      No current facility-administered medications for this visit.     Physical Examination  There were no vitals filed for this visit.  There is no height or weight on file to calculate BMI.  General:  Alert and oriented, no acute distress HEENT: Normal Neck: No bruit or JVD Pulmonary: Clear to auscultation bilaterally Cardiac: Regular Rate and Rhythm without murmur Abdomen: Soft, non-tender, non-distended, no mass, no scars Skin: No rash Extremity Pulses:  2+ radial, brachial, femoral, dorsalis pedis, posterior tibial pulses bilaterally Musculoskeletal: No deformity or edema  Neurologic: Upper and lower extremity motor 5/5 and symmetric  DATA: ***   ASSESSMENT: ***   PLAN: ***   Fabienne Bruns, MD [facility] [place] Office: [phone] Pager: [phone]

## 2018-07-21 ENCOUNTER — Encounter: Payer: Self-pay | Admitting: General Practice

## 2018-08-03 ENCOUNTER — Encounter (HOSPITAL_COMMUNITY): Payer: Self-pay | Admitting: Internal Medicine

## 2018-08-03 ENCOUNTER — Emergency Department (HOSPITAL_COMMUNITY)
Admission: EM | Admit: 2018-08-03 | Discharge: 2018-08-03 | Disposition: A | Discharge status: Home / Self Care | Payer: Medicare HMO | Attending: Emergency Medicine | Admitting: Emergency Medicine

## 2018-08-03 ENCOUNTER — Emergency Department (HOSPITAL_COMMUNITY): Payer: Medicare HMO

## 2018-08-03 DIAGNOSIS — N183 Chronic kidney disease, stage 3 (moderate): Secondary | ICD-10-CM | POA: Insufficient documentation

## 2018-08-03 DIAGNOSIS — I130 Hypertensive heart and chronic kidney disease with heart failure and stage 1 through stage 4 chronic kidney disease, or unspecified chronic kidney disease: Secondary | ICD-10-CM | POA: Insufficient documentation

## 2018-08-03 DIAGNOSIS — I5041 Acute combined systolic (congestive) and diastolic (congestive) heart failure: Secondary | ICD-10-CM | POA: Insufficient documentation

## 2018-08-03 DIAGNOSIS — Z7982 Long term (current) use of aspirin: Secondary | ICD-10-CM | POA: Diagnosis not present

## 2018-08-03 DIAGNOSIS — Z79899 Other long term (current) drug therapy: Secondary | ICD-10-CM | POA: Insufficient documentation

## 2018-08-03 DIAGNOSIS — Z87891 Personal history of nicotine dependence: Secondary | ICD-10-CM | POA: Insufficient documentation

## 2018-08-03 DIAGNOSIS — R0789 Other chest pain: Secondary | ICD-10-CM | POA: Insufficient documentation

## 2018-08-03 LAB — CBC WITH DIFFERENTIAL/PLATELET
Abs Immature Granulocytes: 0.02 10*3/uL (ref 0.00–0.07)
BASOS PCT: 0 %
Basophils Absolute: 0 10*3/uL (ref 0.0–0.1)
EOS ABS: 0 10*3/uL (ref 0.0–0.5)
Eosinophils Relative: 1 %
HEMATOCRIT: 49.2 % (ref 39.0–52.0)
Hemoglobin: 14.6 g/dL (ref 13.0–17.0)
Immature Granulocytes: 0 %
LYMPHS ABS: 2 10*3/uL (ref 0.7–4.0)
Lymphocytes Relative: 33 %
MCH: 26 pg (ref 26.0–34.0)
MCHC: 29.7 g/dL — ABNORMAL LOW (ref 30.0–36.0)
MCV: 87.7 fL (ref 80.0–100.0)
MONO ABS: 0.7 10*3/uL (ref 0.1–1.0)
MONOS PCT: 11 %
Neutro Abs: 3.4 10*3/uL (ref 1.7–7.7)
Neutrophils Relative %: 55 %
PLATELETS: 191 10*3/uL (ref 150–400)
RBC: 5.61 MIL/uL (ref 4.22–5.81)
RDW: 13.5 % (ref 11.5–15.5)
WBC: 6.2 10*3/uL (ref 4.0–10.5)
nRBC: 0 % (ref 0.0–0.2)

## 2018-08-03 LAB — COMPREHENSIVE METABOLIC PANEL
ALT: 21 U/L (ref 0–44)
AST: 25 U/L (ref 15–41)
Albumin: 4.1 g/dL (ref 3.5–5.0)
Alkaline Phosphatase: 130 U/L — ABNORMAL HIGH (ref 38–126)
Anion gap: 10 (ref 5–15)
BILIRUBIN TOTAL: 0.6 mg/dL (ref 0.3–1.2)
BUN: 18 mg/dL (ref 8–23)
CHLORIDE: 104 mmol/L (ref 98–111)
CO2: 25 mmol/L (ref 22–32)
Calcium: 9.6 mg/dL (ref 8.9–10.3)
Creatinine, Ser: 1.63 mg/dL — ABNORMAL HIGH (ref 0.61–1.24)
GFR, EST AFRICAN AMERICAN: 48 mL/min — AB (ref >60)
GFR, EST NON AFRICAN AMERICAN: 42 mL/min — AB (ref >60)
Glucose, Bld: 95 mg/dL (ref 70–99)
Potassium: 4.1 mmol/L (ref 3.5–5.1)
Sodium: 139 mmol/L (ref 135–145)
TOTAL PROTEIN: 7.5 g/dL (ref 6.5–8.1)

## 2018-08-03 LAB — I-STAT TROPONIN, ED
TROPONIN I, POC: 0 ng/mL (ref 0.00–0.08)
Troponin i, poc: 0.01 ng/mL (ref 0.00–0.08)

## 2018-08-03 MED ORDER — NAPROXEN 250 MG PO TABS
500.0000 mg | ORAL_TABLET | Freq: Once | ORAL | Status: AC
  Administered 2018-08-03: 500 mg via ORAL
  Filled 2018-08-03: qty 2

## 2018-08-03 NOTE — ED Provider Notes (Signed)
[facility] EMERGENCY DEPARTMENT Provider Note   CSN: [id number] Arrival date & time: 08/03/18  3244     History   Chief Complaint Chief Complaint  Patient presents with  . Chest Pain    HPI Marcus [name] is a 67 y.o. male.  67 y.o male with a PMH of CHF, HTN, CABG, CAD presents to the ED with a chief complaint of chest pain x 3 days. Patient describes his pain as intermittent sharp pain on the middle of his chest radiating from left side to right. He reports his pain is worse with movement of his neck. He has not tried any therapy for symptomatic relieve. Patient reports feeling the "feels like something looser in there" on his chest.His cardiologist is Dr. Algie Coffer, Viviano Simas he reports having a stress test a couple of months ago and having normal results. Patient denies any cough, n/v/d, fever or abdominal pain.      Past Medical History:  Diagnosis Date  . Arthritis   . Cervical spine disease   . CHF (congestive heart failure) (HCC)   . Depression   . GERD (gastroesophageal reflux disease)   . Hyperlipidemia   . Hypertension     Patient Active Problem List   Diagnosis Date Noted  . Chest pain 09/26/2017  . Cerebrovascular accident (CVA) (HCC)   . Acute combined systolic and diastolic CHF, NYHA class 3 (HCC) 07/16/2016  . Acute cerebral infarction (HCC) 07/16/2016  . Benign essential HTN 07/16/2016  . HLD (hyperlipidemia) 07/16/2016  . Non compliance w medication regimen 07/16/2016  . CKD (chronic kidney disease) stage 3, GFR 30-59 ml/min (HCC) 07/16/2016  . CVA (cerebral vascular accident) (HCC) 07/16/2016  . Chest pain at rest 07/06/2014  . Hypertension 03/11/2011  . CAD (coronary artery disease) 03/11/2011    Past Surgical History:  Procedure Laterality Date  . breast lump removal    . LEFT HEART CATHETERIZATION WITH CORONARY/GRAFT ANGIOGRAM Right 05/01/2012   Procedure: LEFT HEART CATHETERIZATION WITH Isabel Caprice;  Surgeon: Ricki Rodriguez, MD;  Location: [facility] CATH LAB;  Service: Cardiovascular;  Laterality: Right;  . triple bypass          Home Medications    Prior to Admission medications   Medication Sig Start Date End Date Taking? Authorizing Provider  aspirin EC 81 MG tablet Take 81 mg by mouth 3 (three) times a week.   Yes [provider]  cetirizine (ZYRTEC) 10 MG tablet Take 10 mg by mouth as needed.    Yes [provider]  cilostazol (PLETAL) 100 MG tablet Take 1 tablet (100 mg total) by mouth 2 (two) times daily before a meal. 05/18/18  Yes Nada Libman, MD  citalopram (CELEXA) 10 MG tablet Take 10 mg by mouth daily.  08/14/17  Yes [provider]  clopidogrel (PLAVIX) 75 MG tablet Take 1 tablet (75 mg total) by mouth at bedtime. Patient taking differently: Take 75 mg by mouth daily.  07/18/16  Yes Rai, Ripudeep K, MD  lisinopril (PRINIVIL,ZESTRIL) 2.5 MG tablet Take 2.5 mg by mouth daily.    Yes [provider]  Menthol-Methyl Salicylate (MUSCLE RUB) 10-15 % CREA Apply 1 application topically as needed for muscle pain.   Yes [provider]  metoprolol tartrate (LOPRESSOR) 25 MG tablet Take 25 mg by mouth 2 (two) times daily.   Yes [provider]  omeprazole (PRILOSEC) 20 MG capsule Take 20 mg by mouth daily.    Yes [provider]  simvastatin (ZOCOR) 40 MG tablet Take 1 tablet (40 mg total) by mouth at bedtime. 07/18/16  Yes Rai, Ripudeep K, MD  tamsulosin (FLOMAX) 0.4 MG CAPS capsule Take 0.4 mg by mouth daily.    Yes [provider]  tiZANidine (ZANAFLEX) 4 MG tablet Take 4 mg by mouth every 8 (eight) hours as needed for muscle spasms.    Yes [provider]  VITAMIN D PO Take 5,000 Units by mouth daily.   Yes [provider]  zolpidem (AMBIEN) 10 MG tablet Take 10 mg by mouth at bedtime as needed for sleep.    Yes [provider]  gentamicin cream (GARAMYCIN) 0.1 % Apply 1 application topically 2 (two)  times daily. Patient not taking: Reported on 08/03/2018 03/16/18   Felecia ShellingEvans, Brent M, DPM  guaiFENesin (ROBITUSSIN) 100 MG/5ML SOLN Take 5 mLs (100 mg total) by mouth every 4 (four) hours as needed for cough or to loosen phlegm. Patient not taking: Reported on 08/03/2018 09/27/17   Arrien, York RamMauricio Daniel, MD  ondansetron (ZOFRAN) 4 MG tablet Take 1 tablet (4 mg total) by mouth every 8 (eight) hours as needed for nausea or vomiting. Patient not taking: Reported on 08/03/2018 02/19/15   Danelle Berryapia, Leisa, PA-C    Family History Family History  Problem Relation Age of Onset  . Kidney disease Mother   . Asthma Mother   . Hypertension Father     Social History Social History   Tobacco Use  . Smoking status: Former Games developermoker  . Smokeless tobacco: Never Used  Substance Use Topics  . Alcohol use: Yes    Comment: occ  . Drug use: Yes    Types: Marijuana    Comment: "every now and then" last used 09/09/13     Allergies   Penicillins   Review of Systems Review of Systems  Constitutional: Negative for chills and fever.  HENT: Negative for ear pain and sore throat.   Eyes: Negative for pain and visual disturbance.  Respiratory: Negative for cough and shortness of breath.   Cardiovascular: Positive for chest pain. Negative for palpitations.  Gastrointestinal: Negative for abdominal pain and vomiting.  Genitourinary: Negative for dysuria and hematuria.  Musculoskeletal: Negative for arthralgias and back pain.  Skin: Negative for color change and rash.  Neurological: Negative for seizures and syncope.  All other systems reviewed and are negative.    Physical Exam Updated Vital Signs BP (!) 110/58 (BP Location: Right Arm)   Pulse 79   Temp (!) 97.2 F (36.2 C) (Oral)   Resp 20   Ht 5\' 7"  (1.702 m)   Wt 79.4 kg   SpO2 96%   BMI 27.41 kg/m   Physical Exam  Constitutional: He is oriented to person, place, and time. He appears well-developed and well-nourished.  HENT:  Head:  Normocephalic and atraumatic.  Mouth/Throat: Oropharynx is clear and moist.  Eyes: Pupils are equal, round, and reactive to light. No scleral icterus.  Neck: Normal range of motion.  Cardiovascular: Normal heart sounds.  Pulmonary/Chest: Effort normal and breath sounds normal. He has no decreased breath sounds. He has no wheezes. He exhibits tenderness. He exhibits no laceration, no crepitus, no edema and no retraction.  Abdominal: Soft. Bowel sounds are normal. He exhibits no distension. There is no tenderness.  Musculoskeletal: He exhibits no tenderness or deformity.       Right lower leg: He exhibits no edema.       Left lower leg: He exhibits no edema.  Neurological: He  is alert and oriented to person, place, and time.  Skin: Skin is warm and dry.  Nursing note and vitals reviewed.    ED Treatments / Results  Labs (all labs ordered are listed, but only abnormal results are displayed) Labs Reviewed  CBC WITH DIFFERENTIAL/PLATELET - Abnormal; Notable for the following components:      Result Value   MCHC 29.7 (*)    All other components within normal limits  COMPREHENSIVE METABOLIC PANEL - Abnormal; Notable for the following components:   Creatinine, Ser 1.63 (*)    Alkaline Phosphatase 130 (*)    GFR calc non Af Amer 42 (*)    GFR calc Af Amer 48 (*)    All other components within normal limits  I-STAT TROPONIN, ED  I-STAT TROPONIN, ED    EKG EKG Interpretation  Date/Time:  Monday August 03 2018 09:33:43 EST Ventricular Rate:  81 PR Interval:    QRS Duration: 113 QT Interval:  418 QTC Calculation: 486 R Axis:   49 Text Interpretation:  Sinus rhythm Incomplete right bundle branch block Nonspecific T abnormalities, lateral leads Borderline prolonged QT interval No significant change was found Confirmed by Azalia Bilis ([id number]) on 08/03/2018 10:21:03 AM   Radiology Dg Chest 2 View  Result Date: 08/03/2018 CLINICAL DATA:  Chest pain and right hand tingling for  several days, initial encounter EXAM: CHEST - 2 VIEW COMPARISON:  09/26/2017 FINDINGS: Cardiac shadow is within normal limits. Postsurgical changes are again seen. The lungs are well aerated without focal infiltrate. Mild degenerative change of the thoracic spine is noted. IMPRESSION: No active cardiopulmonary disease. Electronically Signed   By: Alcide Clever M.D.   On: 08/03/2018 10:57    Procedures Procedures (including critical care time)  Medications Ordered in ED Medications  naproxen (NAPROSYN) tablet 500 mg (has no administration in time range)     Initial Impression / Assessment and Plan / ED Course  I have reviewed the triage vital signs and the nursing notes.  Pertinent labs & imaging results that were available during my care of the patient were reviewed by me and considered in my medical decision making (see chart for details).    Presents with chest pain which began days ago.  Patient does have a previous history of CAD along with a triple bypass currently on Plavix.  Reports not taking any medication for relief.  CMP showed slight elevation of his creatinine at 1.63 consistent with patient's previous visits in the past.  CBC showed no leukocytosis, hemoglobin stable.  Troponin was negative at 0.00-second troponin was negative at 0.01.  Suspicion for any ACS as patient has had this recurrent pain but it is reproducible with palpation of his chest.  The chest 2 view showed no pleural effusion, pneumothorax, consolidation.  Patient is afebrile during visit and is resting comfortably.  I have provided him with some NSAIDs to help with his pain which she reports improvement. Tachycardia no hypoxia suspicion for pulmonary embolism.  Did have a stress test done a couple months ago and reports the results were within normal limits.  He will be advised to follow-up with his cardiologist as needed.  Strict return precautions provided for patient.  Patient understands and agrees with  management.  Final Clinical Impressions(s) / ED Diagnoses   Final diagnoses:  Atypical chest pain    ED Discharge Orders    None       Claude Manges, Cordelia Poche 08/03/18 1330    Azalia Bilis, MD 08/03/18 1708

## 2018-08-03 NOTE — ED Notes (Signed)
Patient verbalizes understanding of discharge instructions. Opportunity for questioning and answers were provided. Armband removed by staff, pt discharged from ED.  

## 2018-08-03 NOTE — Discharge Instructions (Addendum)
Alternate some Aleve or Tylenol for your pain.  Please schedule an appointment with your primary care physician for further evaluation of this ongoing chest pain.  If you experience any shortness of breath, worsening symptoms you may return to the ED for reevaluation.

## 2018-08-03 NOTE — ED Triage Notes (Signed)
Pt here c/o 10/10 burning chest pain for 3 days. Reports pain comes and goes at any time. Hx CABG.

## 2018-08-17 ENCOUNTER — Encounter: Payer: Self-pay | Admitting: Surgery

## 2018-08-17 ENCOUNTER — Other Ambulatory Visit: Payer: Self-pay

## 2018-08-17 ENCOUNTER — Encounter: Payer: Self-pay | Admitting: *Deleted

## 2018-08-17 ENCOUNTER — Ambulatory Visit (INDEPENDENT_AMBULATORY_CARE_PROVIDER_SITE_OTHER): Payer: Medicare HMO | Admitting: Surgery

## 2018-08-17 VITALS — BP 140/85 | HR 78 | Temp 97.4°F | Resp 18 | Ht 67.0 in | Wt 176.0 lb

## 2018-08-17 DIAGNOSIS — I70211 Atherosclerosis of native arteries of extremities with intermittent claudication, right leg: Secondary | ICD-10-CM | POA: Diagnosis not present

## 2018-08-17 NOTE — Progress Notes (Signed)
[facility] [place]  Patient name: Marcus [name] MRN: [id number] DOB: [birth year] Sex: male   REASON FOR VISIT:    Follow up  HISOTRY OF PRESENT ILLNESS:    Marcus [name] is a 67 y.o. male, who I saw in September 2019 for evaluation of leg pain.  The patient had  been having difficulty approximately 6 months.  He stated  that his right calf gets tight at approximately 150 yards.  It is much worse when he walks uphill.  He also has some symptoms in his left leg with are not as bad.  He does not have rest pain.  He does not have any nonhealing wounds. I elected to treat him initially with medical management including exercise and a trial of Cilostazol  Patient states that he did not get much relief from cilostazol.  He still has claudication with short distance of ambulation.  He also has a shooting pain going down his right leg which bothers him at rest.  He has a known pinched nerve in his back  The patient has a history of coronary artery disease and is status post CABG.  He is medically managed for hypertension.  He has a history of smoking but has quit.  He is on a statin for hypercholesterolemia.   PAST MEDICAL HISTORY:   Past Medical History:  Diagnosis Date  . Arthritis   . Cervical spine disease   . CHF (congestive heart failure) (HCC)   . Depression   . GERD (gastroesophageal reflux disease)   . Hyperlipidemia   . Hypertension      FAMILY HISTORY:   Family History  Problem Relation Age of Onset  . Kidney disease Mother   . Asthma Mother   . Hypertension Father     SOCIAL HISTORY:   Social History   Tobacco Use  . Smoking status: Former Games developer  . Smokeless tobacco: Never Used  Substance Use Topics  . Alcohol use: Yes    Comment: occ     ALLERGIES:   Allergies  Allergen Reactions  . Penicillins Hives and Other (See Comments)    Has patient had a PCN reaction causing immediate rash,  facial/tongue/throat swelling, SOB or lightheadedness with hypotension: No Has patient had a PCN reaction causing severe rash involving mucus membranes or skin necrosis: No Has patient had a PCN reaction that required hospitalization No Has patient had a PCN reaction occurring within the last 10 years: No If all of the above answers are "NO", then may proceed with Cephalosporin use.     CURRENT MEDICATIONS:   Current Outpatient Medications  Medication Sig Dispense Refill  . aspirin EC 81 MG tablet Take 81 mg by mouth 3 (three) times a week.    . cetirizine (ZYRTEC) 10 MG tablet Take 10 mg by mouth as needed.     . cilostazol (PLETAL) 100 MG tablet Take 1 tablet (100 mg total) by mouth 2 (two) times daily before a meal. 60 tablet 11  . citalopram (CELEXA) 10 MG tablet Take 10 mg by mouth daily.   2  . clopidogrel (PLAVIX) 75 MG tablet Take 1 tablet (75 mg total) by mouth at bedtime. (Patient taking differently: Take 75 mg by mouth daily. ) 30 tablet 3  . gentamicin cream (GARAMYCIN) 0.1 % Apply 1 application topically 2 (two) times daily. (Patient not taking: Reported on 08/03/2018) 30 g 1  . guaiFENesin (ROBITUSSIN) 100 MG/5ML SOLN Take 5 mLs (100 mg total) by  mouth every 4 (four) hours as needed for cough or to loosen phlegm. (Patient not taking: Reported on 08/03/2018) 1200 mL 0  . lisinopril (PRINIVIL,ZESTRIL) 2.5 MG tablet Take 2.5 mg by mouth daily.     . Menthol-Methyl Salicylate (MUSCLE RUB) 10-15 % CREA Apply 1 application topically as needed for muscle pain.    . metoprolol tartrate (LOPRESSOR) 25 MG tablet Take 25 mg by mouth 2 (two) times daily.    Marland Kitchen omeprazole (PRILOSEC) 20 MG capsule Take 20 mg by mouth daily.     . ondansetron (ZOFRAN) 4 MG tablet Take 1 tablet (4 mg total) by mouth every 8 (eight) hours as needed for nausea or vomiting. (Patient not taking: Reported on 08/03/2018) 10 tablet 0  . simvastatin (ZOCOR) 40 MG tablet Take 1 tablet (40 mg total) by mouth at bedtime.  30 tablet 3  . tamsulosin (FLOMAX) 0.4 MG CAPS capsule Take 0.4 mg by mouth daily.     Marland Kitchen tiZANidine (ZANAFLEX) 4 MG tablet Take 4 mg by mouth every 8 (eight) hours as needed for muscle spasms.   2  . VITAMIN D PO Take 5,000 Units by mouth daily.    Marland Kitchen zolpidem (AMBIEN) 10 MG tablet Take 10 mg by mouth at bedtime as needed for sleep.      No current facility-administered medications for this visit.     REVIEW OF SYSTEMS:   [X]  denotes positive finding, [ ]  denotes negative finding Cardiac  Comments:  Chest pain or chest pressure:    Shortness of breath upon exertion:    Short of breath when lying flat:    Irregular heart rhythm:        Vascular    Pain in calf, thigh, or hip brought on by ambulation: x   Pain in feet at night that wakes you up from your sleep:     Blood clot in your veins:    Leg swelling:         Pulmonary    Oxygen at home:    Productive cough:     Wheezing:         Neurologic    Sudden weakness in arms or legs:     Sudden numbness in arms or legs:     Sudden onset of difficulty speaking or slurred speech:    Temporary loss of vision in one eye:     Problems with dizziness:         Gastrointestinal    Blood in stool:     Vomited blood:         Genitourinary    Burning when urinating:     Blood in urine:        Psychiatric    Major depression:         Hematologic    Bleeding problems:    Problems with blood clotting too easily:        Skin    Rashes or ulcers:        Constitutional    Fever or chills:      PHYSICAL EXAM:   There were no vitals filed for this visit.  GENERAL: The patient is a well-nourished male, in no acute distress. The vital signs are documented above. CARDIAC: There is a regular rate and rhythm.  VASCULAR: Nonpalpable pedal pulses PULMONARY: Non-labored respirations  MUSCULOSKELETAL: There are no major deformities or cyanosis. NEUROLOGIC: No focal weakness or paresthesias are detected. SKIN: There are no ulcers or  rashes noted. PSYCHIATRIC: The  patient has a normal affect.  STUDIES:   None  MEDICAL ISSUES:   Leg pain: I discussed with the patient that his symptoms of leg pain are multifactorial.  There is definitely a neurologic component.  He is going to address this with his primary care doctor since he has a known pinched nerve in his back.  He also has a component of vascular insufficiency with severe cramping in his leg with minimal distance ambulation.  We discussed the options for treatment including continuing medical management versus proceeding with angiography.  The patient would like to proceed with angiography.  This would be through a left femoral approach with intervention on the right leg if indicated.  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  I told him that I would not intervene on his tibial disease for claudication.  He understands the possibility of limited durability with a percutaneous intervention.  We also discussed the potential risk of bleeding and distal embolization.  His procedure has been scheduled for January 7.  He is going to stop his Pletal.    Durene CalWells Melysa Schroyer, MD [facility] Tel [phone] Pager [phone]

## 2018-08-17 NOTE — H&P (View-Only) (Signed)
 [facility] [place]  Patient name: Marcus [name] MRN: [id number] DOB: 03/02/1951 Sex: male   REASON FOR VISIT:    Follow up  HISOTRY OF PRESENT ILLNESS:    Marcus [name] is a 67 y.o. male, who I saw in September 2019 for evaluation of leg pain.  The patient had  been having difficulty approximately 6 months.  He stated  that his right calf gets tight at approximately 150 yards.  It is much worse when he walks uphill.  He also has some symptoms in his left leg with are not as bad.  He does not have rest pain.  He does not have any nonhealing wounds. I elected to treat him initially with medical management including exercise and a trial of Cilostazol  Patient states that he did not get much relief from cilostazol.  He still has claudication with short distance of ambulation.  He also has a shooting pain going down his right leg which bothers him at rest.  He has a known pinched nerve in his back  The patient has a history of coronary artery disease and is status post CABG.  He is medically managed for hypertension.  He has a history of smoking but has quit.  He is on a statin for hypercholesterolemia.   PAST MEDICAL HISTORY:   Past Medical History:  Diagnosis Date  . Arthritis   . Cervical spine disease   . CHF (congestive heart failure) (HCC)   . Depression   . GERD (gastroesophageal reflux disease)   . Hyperlipidemia   . Hypertension      FAMILY HISTORY:   Family History  Problem Relation Age of Onset  . Kidney disease Mother   . Asthma Mother   . Hypertension Father     SOCIAL HISTORY:   Social History   Tobacco Use  . Smoking status: Former Smoker  . Smokeless tobacco: Never Used  Substance Use Topics  . Alcohol use: Yes    Comment: occ     ALLERGIES:   Allergies  Allergen Reactions  . Penicillins Hives and Other (See Comments)    Has patient had a PCN reaction causing immediate rash,  facial/tongue/throat swelling, SOB or lightheadedness with hypotension: No Has patient had a PCN reaction causing severe rash involving mucus membranes or skin necrosis: No Has patient had a PCN reaction that required hospitalization No Has patient had a PCN reaction occurring within the last 10 years: No If all of the above answers are "NO", then may proceed with Cephalosporin use.     CURRENT MEDICATIONS:   Current Outpatient Medications  Medication Sig Dispense Refill  . aspirin EC 81 MG tablet Take 81 mg by mouth 3 (three) times a week.    . cetirizine (ZYRTEC) 10 MG tablet Take 10 mg by mouth as needed.     . cilostazol (PLETAL) 100 MG tablet Take 1 tablet (100 mg total) by mouth 2 (two) times daily before a meal. 60 tablet 11  . citalopram (CELEXA) 10 MG tablet Take 10 mg by mouth daily.   2  . clopidogrel (PLAVIX) 75 MG tablet Take 1 tablet (75 mg total) by mouth at bedtime. (Patient taking differently: Take 75 mg by mouth daily. ) 30 tablet 3  . gentamicin cream (GARAMYCIN) 0.1 % Apply 1 application topically 2 (two) times daily. (Patient not taking: Reported on 08/03/2018) 30 g 1  . guaiFENesin (ROBITUSSIN) 100 MG/5ML SOLN Take 5 mLs (100 mg total) by   mouth every 4 (four) hours as needed for cough or to loosen phlegm. (Patient not taking: Reported on 08/03/2018) 1200 mL 0  . lisinopril (PRINIVIL,ZESTRIL) 2.5 MG tablet Take 2.5 mg by mouth daily.     . Menthol-Methyl Salicylate (MUSCLE RUB) 10-15 % CREA Apply 1 application topically as needed for muscle pain.    . metoprolol tartrate (LOPRESSOR) 25 MG tablet Take 25 mg by mouth 2 (two) times daily.    . omeprazole (PRILOSEC) 20 MG capsule Take 20 mg by mouth daily.     . ondansetron (ZOFRAN) 4 MG tablet Take 1 tablet (4 mg total) by mouth every 8 (eight) hours as needed for nausea or vomiting. (Patient not taking: Reported on 08/03/2018) 10 tablet 0  . simvastatin (ZOCOR) 40 MG tablet Take 1 tablet (40 mg total) by mouth at bedtime.  30 tablet 3  . tamsulosin (FLOMAX) 0.4 MG CAPS capsule Take 0.4 mg by mouth daily.     . tiZANidine (ZANAFLEX) 4 MG tablet Take 4 mg by mouth every 8 (eight) hours as needed for muscle spasms.   2  . VITAMIN D PO Take 5,000 Units by mouth daily.    . zolpidem (AMBIEN) 10 MG tablet Take 10 mg by mouth at bedtime as needed for sleep.      No current facility-administered medications for this visit.     REVIEW OF SYSTEMS:   [X] denotes positive finding, [ ] denotes negative finding Cardiac  Comments:  Chest pain or chest pressure:    Shortness of breath upon exertion:    Short of breath when lying flat:    Irregular heart rhythm:        Vascular    Pain in calf, thigh, or hip brought on by ambulation: x   Pain in feet at night that wakes you up from your sleep:     Blood clot in your veins:    Leg swelling:         Pulmonary    Oxygen at home:    Productive cough:     Wheezing:         Neurologic    Sudden weakness in arms or legs:     Sudden numbness in arms or legs:     Sudden onset of difficulty speaking or slurred speech:    Temporary loss of vision in one eye:     Problems with dizziness:         Gastrointestinal    Blood in stool:     Vomited blood:         Genitourinary    Burning when urinating:     Blood in urine:        Psychiatric    Major depression:         Hematologic    Bleeding problems:    Problems with blood clotting too easily:        Skin    Rashes or ulcers:        Constitutional    Fever or chills:      PHYSICAL EXAM:   There were no vitals filed for this visit.  GENERAL: The patient is a well-nourished male, in no acute distress. The vital signs are documented above. CARDIAC: There is a regular rate and rhythm.  VASCULAR: Nonpalpable pedal pulses PULMONARY: Non-labored respirations  MUSCULOSKELETAL: There are no major deformities or cyanosis. NEUROLOGIC: No focal weakness or paresthesias are detected. SKIN: There are no ulcers or  rashes noted. PSYCHIATRIC: The   patient has a normal affect.  STUDIES:   None  MEDICAL ISSUES:   Leg pain: I discussed with the patient that his symptoms of leg pain are multifactorial.  There is definitely a neurologic component.  He is going to address this with his primary care doctor since he has a known pinched nerve in his back.  He also has a component of vascular insufficiency with severe cramping in his leg with minimal distance ambulation.  We discussed the options for treatment including continuing medical management versus proceeding with angiography.  The patient would like to proceed with angiography.  This would be through a left femoral approach with intervention on the right leg if indicated.  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  I told him that I would not intervene on his tibial disease for claudication.  He understands the possibility of limited durability with a percutaneous intervention.  We also discussed the potential risk of bleeding and distal embolization.  His procedure has been scheduled for January 7.  He is going to stop his Pletal.    Wells Jordie Skalsky, MD [facility] [place] Tel [phone] Pager [phone] 

## 2018-09-15 ENCOUNTER — Telehealth: Payer: Self-pay | Admitting: Surgery

## 2018-09-15 ENCOUNTER — Other Ambulatory Visit: Payer: Self-pay

## 2018-09-15 ENCOUNTER — Encounter (HOSPITAL_COMMUNITY): Payer: Self-pay | Admitting: Surgery

## 2018-09-15 ENCOUNTER — Ambulatory Visit (HOSPITAL_COMMUNITY)
Admission: RE | Admit: 2018-09-15 | Discharge: 2018-09-15 | Disposition: A | Discharge status: Home / Self Care | Payer: Medicare HMO | Attending: Surgery | Admitting: Surgery

## 2018-09-15 ENCOUNTER — Encounter (HOSPITAL_COMMUNITY)
Admission: RE | Disposition: A | Discharge status: Home / Self Care | Payer: Self-pay | Source: Home / Self Care | Attending: Surgery

## 2018-09-15 DIAGNOSIS — Z87891 Personal history of nicotine dependence: Secondary | ICD-10-CM | POA: Insufficient documentation

## 2018-09-15 DIAGNOSIS — Z7902 Long term (current) use of antithrombotics/antiplatelets: Secondary | ICD-10-CM | POA: Diagnosis not present

## 2018-09-15 DIAGNOSIS — Z880 Allergy status to penicillin: Secondary | ICD-10-CM | POA: Insufficient documentation

## 2018-09-15 DIAGNOSIS — I70211 Atherosclerosis of native arteries of extremities with intermittent claudication, right leg: Secondary | ICD-10-CM

## 2018-09-15 DIAGNOSIS — I110 Hypertensive heart disease with heart failure: Secondary | ICD-10-CM | POA: Diagnosis not present

## 2018-09-15 DIAGNOSIS — M1990 Unspecified osteoarthritis, unspecified site: Secondary | ICD-10-CM | POA: Insufficient documentation

## 2018-09-15 DIAGNOSIS — E7800 Pure hypercholesterolemia, unspecified: Secondary | ICD-10-CM | POA: Insufficient documentation

## 2018-09-15 DIAGNOSIS — Z951 Presence of aortocoronary bypass graft: Secondary | ICD-10-CM | POA: Insufficient documentation

## 2018-09-15 DIAGNOSIS — K219 Gastro-esophageal reflux disease without esophagitis: Secondary | ICD-10-CM | POA: Insufficient documentation

## 2018-09-15 DIAGNOSIS — Z7982 Long term (current) use of aspirin: Secondary | ICD-10-CM | POA: Diagnosis not present

## 2018-09-15 DIAGNOSIS — F329 Major depressive disorder, single episode, unspecified: Secondary | ICD-10-CM | POA: Insufficient documentation

## 2018-09-15 DIAGNOSIS — I509 Heart failure, unspecified: Secondary | ICD-10-CM | POA: Diagnosis not present

## 2018-09-15 DIAGNOSIS — I70202 Unspecified atherosclerosis of native arteries of extremities, left leg: Secondary | ICD-10-CM | POA: Diagnosis not present

## 2018-09-15 DIAGNOSIS — I739 Peripheral vascular disease, unspecified: Secondary | ICD-10-CM | POA: Diagnosis present

## 2018-09-15 DIAGNOSIS — I2510 Atherosclerotic heart disease of native coronary artery without angina pectoris: Secondary | ICD-10-CM | POA: Insufficient documentation

## 2018-09-15 HISTORY — PX: ABDOMINAL AORTOGRAM W/LOWER EXTREMITY: CATH118223

## 2018-09-15 LAB — POCT I-STAT, CHEM 8
BUN: 10 mg/dL (ref 8–23)
Calcium, Ion: 1.14 mmol/L — ABNORMAL LOW (ref 1.15–1.40)
Chloride: 104 mmol/L (ref 98–111)
Creatinine, Ser: 1.4 mg/dL — ABNORMAL HIGH (ref 0.61–1.24)
Glucose, Bld: 95 mg/dL (ref 70–99)
HCT: 40 % (ref 39.0–52.0)
Hemoglobin: 13.6 g/dL (ref 13.0–17.0)
Potassium: 4 mmol/L (ref 3.5–5.1)
Sodium: 140 mmol/L (ref 135–145)
TCO2: 29 mmol/L (ref 22–32)

## 2018-09-15 SURGERY — ABDOMINAL AORTOGRAM W/LOWER EXTREMITY
Anesthesia: LOCAL | Laterality: Bilateral

## 2018-09-15 MED ORDER — HEPARIN (PORCINE) IN NACL 1000-0.9 UT/500ML-% IV SOLN
INTRAVENOUS | Status: DC | PRN
  Administered 2018-09-15 (×2): 500 mL

## 2018-09-15 MED ORDER — ONDANSETRON HCL 4 MG/2ML IJ SOLN: 4.0000 mg | Freq: Four times a day (QID) | INTRAMUSCULAR | Status: DC | PRN

## 2018-09-15 MED ORDER — SODIUM CHLORIDE 0.9 % IV SOLN: 250.0000 mL | INTRAVENOUS | Status: DC | PRN

## 2018-09-15 MED ORDER — LIDOCAINE HCL (PF) 1 % IJ SOLN
INTRAMUSCULAR | Status: AC
  Filled 2018-09-15: qty 30

## 2018-09-15 MED ORDER — FENTANYL CITRATE (PF) 100 MCG/2ML IJ SOLN
INTRAMUSCULAR | Status: DC | PRN
  Administered 2018-09-15 (×2): 25 ug via INTRAVENOUS

## 2018-09-15 MED ORDER — SODIUM CHLORIDE 0.9 % IV SOLN
INTRAVENOUS | Status: DC
  Administered 2018-09-15: 06:00:00 via INTRAVENOUS

## 2018-09-15 MED ORDER — HEPARIN (PORCINE) IN NACL 1000-0.9 UT/500ML-% IV SOLN
INTRAVENOUS | Status: AC
  Filled 2018-09-15: qty 500

## 2018-09-15 MED ORDER — LIDOCAINE HCL (PF) 1 % IJ SOLN
INTRAMUSCULAR | Status: DC | PRN
  Administered 2018-09-15: 15 mL

## 2018-09-15 MED ORDER — LABETALOL HCL 5 MG/ML IV SOLN: 10.0000 mg | INTRAVENOUS | Status: DC | PRN

## 2018-09-15 MED ORDER — MIDAZOLAM HCL 2 MG/2ML IJ SOLN
INTRAMUSCULAR | Status: AC
  Filled 2018-09-15: qty 2

## 2018-09-15 MED ORDER — SODIUM CHLORIDE 0.9% FLUSH: 3.0000 mL | INTRAVENOUS | Status: DC | PRN

## 2018-09-15 MED ORDER — IODIXANOL 320 MG/ML IV SOLN
INTRAVENOUS | Status: DC | PRN
  Administered 2018-09-15: 65 mL via INTRAVENOUS

## 2018-09-15 MED ORDER — HYDRALAZINE HCL 20 MG/ML IJ SOLN: 5.0000 mg | INTRAMUSCULAR | Status: DC | PRN

## 2018-09-15 MED ORDER — MIDAZOLAM HCL 2 MG/2ML IJ SOLN
INTRAMUSCULAR | Status: DC | PRN
  Administered 2018-09-15 (×2): 1 mg via INTRAVENOUS

## 2018-09-15 MED ORDER — ACETAMINOPHEN 325 MG PO TABS: 650.0000 mg | ORAL_TABLET | ORAL | Status: DC | PRN

## 2018-09-15 MED ORDER — OXYCODONE HCL 5 MG PO TABS: 5.0000 mg | ORAL_TABLET | ORAL | Status: DC | PRN

## 2018-09-15 MED ORDER — MORPHINE SULFATE (PF) 10 MG/ML IV SOLN: 2.0000 mg | INTRAVENOUS | Status: DC | PRN

## 2018-09-15 MED ORDER — SODIUM CHLORIDE 0.9% FLUSH: 3.0000 mL | Freq: Two times a day (BID) | INTRAVENOUS | Status: DC

## 2018-09-15 MED ORDER — FENTANYL CITRATE (PF) 100 MCG/2ML IJ SOLN
INTRAMUSCULAR | Status: AC
  Filled 2018-09-15: qty 2

## 2018-09-15 MED ORDER — SODIUM CHLORIDE 0.9 % WEIGHT BASED INFUSION: 1.0000 mL/kg/h | INTRAVENOUS | Status: DC

## 2018-09-15 SURGICAL SUPPLY — 10 items
CATH OMNI FLUSH 5F 65CM (CATHETERS) ×1 IMPLANT
CLOSURE MYNX CONTROL 5F (Vascular Products) ×1 IMPLANT
KIT MICROPUNCTURE NIT STIFF (SHEATH) ×1 IMPLANT
KIT PV (KITS) ×2 IMPLANT
SHEATH PINNACLE 5F 10CM (SHEATH) ×1 IMPLANT
SHEATH PROBE COVER 6X72 (BAG) ×1 IMPLANT
SYR MEDRAD MARK V 150ML (SYRINGE) ×1 IMPLANT
TRANSDUCER W/STOPCOCK (MISCELLANEOUS) ×2 IMPLANT
TRAY PV CATH (CUSTOM PROCEDURE TRAY) ×2 IMPLANT
WIRE BENTSON .035X145CM (WIRE) ×1 IMPLANT

## 2018-09-15 NOTE — Op Note (Signed)
    Patient name: Marcus [name] MRN: [id number] DOB: 04-14-1951 Sex: male  09/15/2018 Pre-operative Diagnosis: Right leg claudication Post-operative diagnosis:  Same Surgeon:  Durene Cal Procedure Performed:  1.  Ultrasound-guided access, left femoral artery  2.  Abdominal aortogram  3.  Second-order catheterization  4.  Bilateral lower extremity runoff  5.  Closure device (Mynx)  6.  Conscious sedation ( )    Indications: The patient is having significant limitations with his walking.  He failed medical therapy.  He comes in for further evaluation and possible eventual  Procedure:  The patient was identified in the holding area and taken to room 8.  The patient was then placed supine on the table and prepped and draped in the usual sterile fashion.  A time out was called.  Conscious sedation was administered with use of IV fentanyl Versed under continuous physician and nurse monitoring.  Heart rate, blood pressure, and oxygen saturation were continuously monitored.  Ultrasound was used to evaluate the left common femoral artery.  It was patent .  A digital ultrasound image was acquired.  A micropuncture needle was used to access the left common femoral artery under ultrasound guidance.  An 018 wire was advanced without resistance and a micropuncture sheath was placed.  The 018 wire was removed and a benson wire was placed.  The micropuncture sheath was exchanged for a 5 french sheath.  An omniflush catheter was advanced over the wire to the level of L-1.  An abdominal angiogram was obtained.  Next, using the omniflush catheter and a benson wire, the aortic bifurcation was crossed and the catheter was placed into theright external iliac artery and right runoff was obtained.  left runoff was performed via retrograde sheath injections.  Findings:   Aortogram: No significant renal artery stenosis was identified.  The infrarenal abdominal aorta is widely patent.  Bilateral common and  external iliac arteries are widely patent.  Right Lower Extremity: The right common femoral profundofemoral artery widely patent.  The superficial femoral arteries pain throughout its course.  There is mild luminal narrowing at the adductor canal.  Popliteal arteries patent on his course.  There is occlusion of all 3 tibial vessels just beyond the origin with reconstitution of the peroneal artery which is collateralized at the ankle.   Left Lower Extremity: The left common femoral profundofemoral artery widely patent.  The superficial femoral artery is widely patent.  There is approximately 60% stenosis within the adductor canal.  The anterior tibial artery is occluded just beyond its origin.  There is diffuse luminal irregularity within the tibioperoneal trunk.  The peroneal artery is occluded but reconstitutes in the mid calf.  The posterior tibial artery is the dominant runoff.  Intervention: None  Impression:  #1 no significant aortoiliac stenosis.  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  Moderate stenosis within the tibioperoneal trunk.  The posterior tibial is the dominant runoff vessel.   Juleen China, M.D. [facility] [place] Office: [phone] Pager:  [phone]

## 2018-09-15 NOTE — Discharge Instructions (Signed)
Femoral Site Care °This sheet gives you information about how to care for yourself after your procedure. Your health care provider may also give you more specific instructions. If you have problems or questions, contact your health care provider. °What can I expect after the procedure? °After the procedure, it is common to have: °· Bruising that usually fades within 1-2 weeks. °· Tenderness at the site. °Follow these instructions at home: °Wound care °· Follow instructions from your health care provider about how to take care of your insertion site. Make sure you: °? Wash your hands with soap and water before you change your bandage (dressing). If soap and water are not available, use hand sanitizer. °? Change your dressing as told by your health care provider. °? Leave stitches (sutures), skin glue, or adhesive strips in place. These skin closures may need to stay in place for 2 weeks or longer. If adhesive strip edges start to loosen and curl up, you may trim the loose edges. Do not remove adhesive strips completely unless your health care provider tells you to do that. °· Do not take baths, swim, or use a hot tub until your health care provider approves. °· You may shower 24-48 hours after the procedure or as told by your health care provider. °? Gently wash the site with plain soap and water. °? Pat the area dry with a clean towel. °? Do not rub the site. This may cause bleeding. °· Do not apply powder or lotion to the site. Keep the site clean and dry. °· Check your femoral site every day for signs of infection. Check for: °? Redness, swelling, or pain. °? Fluid or blood. °? Warmth. °? Pus or a bad smell. °Activity °· For the first 2-3 days after your procedure, or as long as directed: °? Avoid climbing stairs as much as possible. °? Do not squat. °· Do not lift anything that is heavier than 10 lb (4.5 kg), or the limit that you are told, until your health care provider says that it is safe. °· Rest as  directed. °? Avoid sitting for a long time without moving. Get up to take short walks every 1-2 hours. °· Do not drive for 24 hours if you were given a medicine to help you relax (sedative). °General instructions °· Take over-the-counter and prescription medicines only as told by your health care provider. °· Keep all follow-up visits as told by your health care provider. This is important. °Contact a health care provider if you have: °· A fever or chills. °· You have redness, swelling, or pain around your insertion site. °Get help right away if: °· The catheter insertion area swells very fast. °· You pass out. °· You suddenly start to sweat or your skin gets clammy. °· The catheter insertion area is bleeding, and the bleeding does not stop when you hold steady pressure on the area. °· The area near or just beyond the catheter insertion site becomes pale, cool, tingly, or numb. °These symptoms may represent a serious problem that is an emergency. Do not wait to see if the symptoms will go away. Get medical help right away. Call your local emergency services (911 in the [place] Do not drive yourself to the hospital. °Summary °· After the procedure, it is common to have bruising that usually fades within 1-2 weeks. °· Check your femoral site every day for signs of infection. °· Do not lift anything that is heavier than 10 lb (4.5 kg), or the   limit that you are told, until your health care provider says that it is safe. °This information is not intended to replace advice given to you by your health care provider. Make sure you discuss any questions you have with your health care provider. °Document Released: 04/29/2014 Document Revised: 09/08/2017 Document Reviewed: 09/08/2017 °Elsevier Interactive Patient Education © 2019 [employer] ° °Moderate Conscious Sedation, Adult, Care After °These instructions provide you with information about caring for yourself after your procedure. Your health care provider may also give  you more specific instructions. Your treatment has been planned according to current medical practices, but problems sometimes occur. Call your health care provider if you have any problems or questions after your procedure. °What can I expect after the procedure? °After your procedure, it is common: °· To feel sleepy for several hours. °· To feel clumsy and have poor balance for several hours. °· To have poor judgment for several hours. °· To vomit if you eat too soon. °Follow these instructions at home: °For at least 24 hours after the procedure: ° °· Do not: °? Participate in activities where you could fall or become injured. °? Drive. °? Use heavy machinery. °? Drink alcohol. °? Take sleeping pills or medicines that cause drowsiness. °? Make important decisions or sign legal documents. °? Take care of children on your own. °· Rest. °Eating and drinking °· Follow the diet recommended by your health care provider. °· If you vomit: °? Drink water, juice, or soup when you can drink without vomiting. °? Make sure you have little or no nausea before eating solid foods. °General instructions °· Have a responsible adult stay with you until you are awake and alert. °· Take over-the-counter and prescription medicines only as told by your health care provider. °· If you smoke, do not smoke without supervision. °· Keep all follow-up visits as told by your health care provider. This is important. °Contact a health care provider if: °· You keep feeling nauseous or you keep vomiting. °· You feel light-headed. °· You develop a rash. °· You have a fever. °Get help right away if: °· You have trouble breathing. °This information is not intended to replace advice given to you by your health care provider. Make sure you discuss any questions you have with your health care provider. °Document Released: 06/16/2013 Document Revised: 01/29/2016 Document Reviewed: 12/16/2015 °Elsevier Interactive Patient Education © 2019 [employer] ° °

## 2018-09-15 NOTE — Telephone Encounter (Signed)
sch appt phone NA mld ltr 03/15/2019 2pm ABI 245pm f/u NP

## 2018-09-15 NOTE — Progress Notes (Signed)
Up and walked and tolerated well; left groin stable, no bleeding or hematoma 

## 2018-09-15 NOTE — Telephone Encounter (Signed)
-----   Message from Sharee Pimple, RN sent at 09/15/2018  8:29 AM EST ----- Regarding: 6 months with ABIs  ----- Message ----- From: Nada Libman, MD Sent: 09/15/2018   8:14 AM EST To: [facility] Charge Pool  09/15/2018:  Surgeon:  Durene Cal Procedure Performed:  1.  Ultrasound-guided access, left femoral artery  2.  Abdominal aortogram  3.  Second-order catheterization  4.  Bilateral lower extremity runoff  5.  Closure device (Mynx)  6.  Conscious sedation ( )   Have patient follow-up with Rosalita Chessman in 6 months with ABIs for follow-up of claudication symptoms

## 2018-09-15 NOTE — Interval H&P Note (Signed)
History and Physical Interval Note:  09/15/2018 7:28 AM  Marcus [name]  has presented today for surgery, with the diagnosis of pvd witrh claudication  The various methods of treatment have been discussed with the patient and family. After consideration of risks, benefits and other options for treatment, the patient has consented to  Procedure(s): ABDOMINAL AORTOGRAM W/LOWER EXTREMITY (N/A) as a surgical intervention .  The patient's history has been reviewed, patient examined, no change in status, stable for surgery.  I have reviewed the patient's chart and labs.  Questions were answered to the patient's satisfaction.     Durene CalWells Lenton Gendreau

## 2018-09-21 ENCOUNTER — Encounter: Payer: Self-pay | Admitting: Surgery

## 2018-09-21 ENCOUNTER — Ambulatory Visit: Payer: Medicare HMO | Admitting: Surgery

## 2019-03-09 ENCOUNTER — Other Ambulatory Visit: Payer: Self-pay

## 2019-03-09 DIAGNOSIS — I70211 Atherosclerosis of native arteries of extremities with intermittent claudication, right leg: Secondary | ICD-10-CM

## 2019-03-15 ENCOUNTER — Ambulatory Visit (HOSPITAL_COMMUNITY)
Admission: RE | Admit: 2019-03-15 | Discharge: 2019-03-15 | Disposition: A | Discharge status: Home / Self Care | Payer: Medicare HMO | Source: Ambulatory Visit | Attending: Family | Admitting: Family

## 2019-03-15 ENCOUNTER — Ambulatory Visit (INDEPENDENT_AMBULATORY_CARE_PROVIDER_SITE_OTHER): Payer: Medicare HMO | Admitting: Family

## 2019-03-15 ENCOUNTER — Other Ambulatory Visit: Payer: Self-pay

## 2019-03-15 ENCOUNTER — Encounter: Payer: Self-pay | Admitting: Family

## 2019-03-15 VITALS — BP 152/79 | HR 76 | Temp 97.9°F | Resp 16 | Ht 67.0 in | Wt 171.0 lb

## 2019-03-15 DIAGNOSIS — Z87891 Personal history of nicotine dependence: Secondary | ICD-10-CM

## 2019-03-15 DIAGNOSIS — I70211 Atherosclerosis of native arteries of extremities with intermittent claudication, right leg: Secondary | ICD-10-CM | POA: Diagnosis present

## 2019-03-15 NOTE — Patient Instructions (Signed)
Peripheral Vascular Disease  Peripheral vascular disease (PVD) is a disease of the blood vessels that are not part of your heart and brain. A simple term for PVD is poor circulation. In most cases, PVD narrows the blood vessels that carry blood from your heart to the rest of your body. This can reduce the supply of blood to your arms, legs, and internal organs, like your stomach or kidneys. However, PVD most often affects a person's lower legs and feet. Without treatment, PVD tends to get worse. PVD can also lead to acute ischemic limb. This is when an arm or leg suddenly cannot get enough blood. This is a medical emergency. Follow these instructions at home: Lifestyle  Do not use any products that contain nicotine or tobacco, such as cigarettes and e-cigarettes. If you need help quitting, ask your doctor.  Lose weight if you are overweight. Or, stay at a healthy weight as told by your doctor.  Eat a diet that is low in fat and cholesterol. If you need help, ask your doctor.  Exercise regularly. Ask your doctor for activities that are right for you. General instructions  Take over-the-counter and prescription medicines only as told by your doctor.  Take good care of your feet: ? Wear comfortable shoes that fit well. ? Check your feet often for any cuts or sores.  Keep all follow-up visits as told by your doctor This is important. Contact a doctor if:  You have cramps in your legs when you walk.  You have leg pain when you are at rest.  You have coldness in a leg or foot.  Your skin changes.  You are unable to get or have an erection (erectile dysfunction).  You have cuts or sores on your feet that do not heal. Get help right away if:  Your arm or leg turns cold, numb, and blue.  Your arms or legs become red, warm, swollen, painful, or numb.  You have chest pain.  You have trouble breathing.  You suddenly have weakness in your face, arm, or leg.  You become very  confused or you cannot speak.  You suddenly have a very bad headache.  You suddenly cannot see. Summary  Peripheral vascular disease (PVD) is a disease of the blood vessels.  A simple term for PVD is poor circulation. Without treatment, PVD tends to get worse.  Treatment may include exercise, low fat and low cholesterol diet, and quitting smoking. This information is not intended to replace advice given to you by your health care provider. Make sure you discuss any questions you have with your health care provider. Document Released: 11/20/2009 Document Revised: 08/08/2017 Document Reviewed: 10/03/2016 Elsevier Patient Education  2020 Elsevier Inc.  

## 2019-03-15 NOTE — Progress Notes (Signed)
[facility] [place]   CC: Follow up peripheral artery occlusive disease  History of Present Illness Marcus [name] is a 68 y.o. male who is s/p abdominal aortogram on 09-15-18 by Dr. Myra GianottiBrabham, no intervention.  Impression:             #1 no significant aortoiliac stenosis.             #2 40- mid right superficial femoral artery stenosis-50%             #3 occlusion of all 3 tibial vessels on the right with reconstitution of a peroneal artery in the mid calf which is the dominant vessel to the ankle             #4 60% stenosis within the left superficial femoral artery in the adductor canal.  Moderate stenosis within the tibioperoneal trunk.  The posterior tibial is the dominant runoff vessel.  Pt returns today for ABI's and follow up.   He states that his right calf has an irritating burning sensation at rest for the last year, this has not worsened. He states he has a disc problem in his lumbar spine, states he was hurt at his job in 1986. He states he does not have a spine specialist any more.  He states that both calves hurt after walking about 1.5 blocks, eases off with rest.  He states that one of his doctors told him that he might have a heel spur in his left heel.   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   He cares for his wife with stage 4 breast and lung cancer.   Diabetic: No Tobacco use: former smoker, quit about 2006, started in childhood  Pt meds include: Statin :Yes Betablocker: Yes ASA: states one of his doctors stopped the ASA Other anticoagulants/antiplatelets: Plavix  Past Medical History:  Diagnosis Date  . Arthritis   . Cervical spine disease   . CHF (congestive heart failure) (HCC)   . Depression   . GERD (gastroesophageal reflux disease)   . Hyperlipidemia   . Hypertension     Social History Social History    Tobacco Use  . Smoking status: Former Games developermoker  . Smokeless tobacco: Never Used  Substance Use Topics  . Alcohol use: Yes    Comment: occ  . Drug use: Yes    Types: Marijuana    Comment: "every now and then" last used 09/09/13    Family History Family History  Problem Relation Age of Onset  . Kidney disease Mother   . Asthma Mother   . Hypertension Father     Past Surgical History:  Procedure Laterality Date  . ABDOMINAL AORTOGRAM W/LOWER EXTREMITY Bilateral 09/15/2018   Procedure: ABDOMINAL AORTOGRAM W/LOWER EXTREMITY;  Surgeon: Nada LibmanBrabham, Vance W, MD;  Location: [facility];  Service: Cardiovascular;  Laterality: Bilateral;  . breast lump removal    . LEFT HEART CATHETERIZATION WITH CORONARY/GRAFT ANGIOGRAM Right 05/01/2012   Procedure: LEFT HEART CATHETERIZATION WITH Isabel CapriceORONARY/GRAFT ANGIOGRAM;  Surgeon: Ricki RodriguezAjay S Kadakia, MD;  Location: [facility] CATH [facility];  Service: Cardiovascular;  Laterality: Right;  . triple bypass      Allergies  Allergen Reactions  . Penicillins Hives and Other (See Comments)    Has patient had a PCN reaction causing immediate rash, facial/tongue/throat swelling, SOB or lightheadedness with  hypotension: No Has patient had a PCN reaction causing severe rash involving mucus membranes or skin necrosis: No Has patient had a PCN reaction that required hospitalization No Has patient had a PCN reaction occurring within the last 10 years: No If all of the above answers are "NO", then may proceed with Cephalosporin use.    Current Outpatient Medications  Medication Sig Dispense Refill  . aspirin EC 81 MG tablet Take 81 mg by mouth 3 (three) times a week.    . cetirizine (ZYRTEC) 10 MG tablet Take 10 mg by mouth daily as needed for allergies.     . Cholecalciferol (VITAMIN D-3) 125 MCG (5000 UT) TABS Take 5,000 Units by mouth daily.    . cilostazol (PLETAL) 100 MG tablet Take 1 tablet (100 mg total) by mouth 2 (two) times daily before a meal. (Patient not taking:  Reported on 03/15/2019) 60 tablet 11  . citalopram (CELEXA) 10 MG tablet Take 10 mg by mouth daily.   2  . clopidogrel (PLAVIX) 75 MG tablet Take 1 tablet (75 mg total) by mouth at bedtime. (Patient not taking: Reported on 03/15/2019) 30 tablet 3  . gentamicin cream (GARAMYCIN) 0.1 % Apply 1 application topically 2 (two) times daily. (Patient not taking: Reported on 03/15/2019) 30 g 1  . lisinopril (PRINIVIL,ZESTRIL) 2.5 MG tablet Take 2.5 mg by mouth daily.     . Menthol-Methyl Salicylate (MUSCLE RUB) 10-15 % CREA Apply 1 application topically as needed for muscle pain.    . metoprolol tartrate (LOPRESSOR) 25 MG tablet Take 25 mg by mouth 2 (two) times daily.    Marland Kitchen. omeprazole (PRILOSEC) 20 MG capsule Take 20 mg by mouth 2 (two) times daily.     . simvastatin (ZOCOR) 40 MG tablet Take 1 tablet (40 mg total) by mouth at bedtime. (Patient not taking: Reported on 03/15/2019) 30 tablet 3  . tamsulosin (FLOMAX) 0.4 MG CAPS capsule Take 0.4 mg by mouth daily.     . traMADol (ULTRAM) 50 MG tablet Take 50 mg by mouth every 6 (six) hours as needed (pain).   2  . zolpidem (AMBIEN) 10 MG tablet Take 10 mg by mouth at bedtime as needed for sleep.      No current facility-administered medications for this visit.     ROS: See HPI for pertinent positives and negatives.   Physical Examination  Vitals:   03/15/19 1504  BP: (!) 152/79  Pulse: 76  Resp: 16  Temp: 97.9 F (36.6 C)  TempSrc: Temporal  SpO2: 97%  Weight: 171 lb (77.6 kg)  Height: 5\' 7"  (1.702 m)   Body mass index is 26.78 kg/m.  General: A&O x 3, WDWN, male. Gait: normal HENT: No gross abnormalities.  Eyes: PERRLA. Pulmonary: Respirations are non labored, CTAB, good air movement in all fields Cardiac: regular rhythm, no detected murmur.         Carotid Bruits Right Left   Negative Negative   Radial pulses are 2+ palpable bilaterally   Adominal aortic pulse is not palpable                         VASCULAR EXAM: Extremities without  ischemic changes, without Gangrene; without open wounds.  LE Pulses Right Left       FEMORAL  3+ palpable  3+ palpable        POPLITEAL  2+ palpable   not palpable       POSTERIOR TIBIAL  not palpable   not palpable        DORSALIS PEDIS      ANTERIOR TIBIAL not palpable  1+ palpable    Abdomen: soft, NT, no palpable masses. Skin: no rashes, no cellulitis, no ulcers noted. Musculoskeletal: no muscle wasting or atrophy.  Neurologic: A&O X 3; appropriate affect, Sensation is normal; MOTOR FUNCTION:  moving all extremities equally, motor strength 5/5 in upper extremities, 4/5 in lower extremities. Speech is fluent/normal. CN 2-12 intact. Psychiatric: Thought content is normal, mood appropriate for clinical situation.     ASSESSMENT: Marcus [name] is a 68 y.o. 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  Abdominal aortogram in January 2020 show occlusion of all 3 tibial vessels on the right with reconstitution of a peroneal artery in the mid calf which is the dominant vessel to the ankle, and 60% stenosis within the left superficial femoral artery in the adductor canal.  Moderate stenosis within the left tibioperoneal trunk.  The left posterior tibial is the dominant runoff vessel.  He has no signs of ischemia in his feet or legs.  ABI's today show left LE remains normal with biphasic waveforms and right has improved slightly to mild disease with monophasic waveforms.    DATA  ABI (Date: 03/15/2019): ABI Findings: +---------+------------------+-----+----------+--------+ Right    Rt Pressure (mmHg)IndexWaveform  Comment  +---------+------------------+-----+----------+--------+ Brachial 146                                        +---------+------------------+-----+----------+--------+ PTA      111               0.74 monophasic         +---------+------------------+-----+----------+--------+ DP       125               0.84 monophasic         +---------+------------------+-----+----------+--------+ Great Toe76                0.51 Abnormal           +---------+------------------+-----+----------+--------+  +---------+------------------+-----+--------+-------+ Left     Lt Pressure (mmHg)IndexWaveformComment +---------+------------------+-----+--------+-------+ Brachial 149                                    +---------+------------------+-----+--------+-------+ PTA      172               1.15 biphasic        +---------+------------------+-----+--------+-------+ DP       112               0.75 biphasic        +---------+------------------+-----+--------+-------+ Great Toe74                0.50 Abnormal        +---------+------------------+-----+--------+-------+  +-------+-----------+-----------+------------+------------+ ABI/TBIToday's ABIToday's TBIPrevious ABIPrevious TBI +-------+-----------+-----------+------------+------------+ Right  0.84       0.51  0.70        0.31         +-------+-----------+-----------+------------+------------+ Left   1.15       0.50       1.11        0.40         +-------+-----------+-----------+------------+------------+ Summary: Right: Resting right ankle-brachial index indicates mild right lower extremity arterial disease. The right toe-brachial index is abnormal.  Left: Resting left ankle-brachial index is within normal range. No evidence of significant left lower extremity arterial disease. The left toe-brachial index is abnormal.     PLAN:  Based on the patient's vascular studies and examination, pt will return to clinic in 6 months with ABI's.  I advised him to notify  us if he develops concerns re the circulation in his feet or legs.   I discussed in depth with the patient the nature of atherosclerosis, and emphasized the importance of maximal medical management including strict control of blood pressure, blood glucose, and lipid levels, obtaining regular exercise, and continued cessation of smoking.  The patient is aware that without maximal medical management the underlying atherosclerotic disease process will progress, limiting the benefit of any interventions.  The patient was given information about PAD including signs, symptoms, treatment, what symptoms should prompt the patient to seek immediate medical care, and risk reduction measures to take.  Clemon Chambers, RN, MSN, FNP-C [facility] Phone: [phone]  Clinic MD: Trula Slade  03/15/19 3:20 PM

## 2019-11-24 ENCOUNTER — Ambulatory Visit: Payer: Medicare HMO | Admitting: Podiatry

## 2019-11-24 ENCOUNTER — Other Ambulatory Visit: Payer: Self-pay | Admitting: *Deleted

## 2019-11-24 DIAGNOSIS — R52 Pain, unspecified: Secondary | ICD-10-CM

## 2019-11-24 DIAGNOSIS — R252 Cramp and spasm: Secondary | ICD-10-CM

## 2019-11-24 DIAGNOSIS — I70211 Atherosclerosis of native arteries of extremities with intermittent claudication, right leg: Secondary | ICD-10-CM

## 2019-11-25 ENCOUNTER — Other Ambulatory Visit: Payer: Self-pay

## 2019-11-25 ENCOUNTER — Ambulatory Visit (HOSPITAL_COMMUNITY)
Admission: RE | Admit: 2019-11-25 | Discharge: 2019-11-25 | Disposition: A | Discharge status: Home / Self Care | Payer: Medicare HMO | Source: Ambulatory Visit | Attending: Vascular Surgery | Admitting: Vascular Surgery

## 2019-11-25 ENCOUNTER — Ambulatory Visit (INDEPENDENT_AMBULATORY_CARE_PROVIDER_SITE_OTHER): Payer: Medicare HMO | Admitting: Vascular Surgery

## 2019-11-25 ENCOUNTER — Other Ambulatory Visit (HOSPITAL_COMMUNITY)
Admission: RE | Admit: 2019-11-25 | Discharge: 2019-11-25 | Disposition: A | Discharge status: Home / Self Care | Payer: Medicare HMO | Source: Ambulatory Visit | Attending: Vascular Surgery | Admitting: Vascular Surgery

## 2019-11-25 ENCOUNTER — Encounter: Payer: Self-pay | Admitting: Vascular Surgery

## 2019-11-25 VITALS — BP 119/70 | HR 73 | Temp 98.1°F | Resp 20 | Ht 67.0 in | Wt 178.0 lb

## 2019-11-25 DIAGNOSIS — I739 Peripheral vascular disease, unspecified: Secondary | ICD-10-CM | POA: Diagnosis not present

## 2019-11-25 DIAGNOSIS — I10 Essential (primary) hypertension: Secondary | ICD-10-CM | POA: Insufficient documentation

## 2019-11-25 DIAGNOSIS — I70211 Atherosclerosis of native arteries of extremities with intermittent claudication, right leg: Secondary | ICD-10-CM

## 2019-11-25 DIAGNOSIS — Z87820 Personal history of traumatic brain injury: Secondary | ICD-10-CM | POA: Diagnosis not present

## 2019-11-25 DIAGNOSIS — E785 Hyperlipidemia, unspecified: Secondary | ICD-10-CM | POA: Insufficient documentation

## 2019-11-25 DIAGNOSIS — R52 Pain, unspecified: Secondary | ICD-10-CM

## 2019-11-25 DIAGNOSIS — R252 Cramp and spasm: Secondary | ICD-10-CM

## 2019-11-25 DIAGNOSIS — Z20822 Contact with and (suspected) exposure to covid-19: Secondary | ICD-10-CM | POA: Diagnosis not present

## 2019-11-25 DIAGNOSIS — Z01818 Encounter for other preprocedural examination: Secondary | ICD-10-CM | POA: Diagnosis not present

## 2019-11-25 DIAGNOSIS — M79674 Pain in right toe(s): Secondary | ICD-10-CM | POA: Insufficient documentation

## 2019-11-25 DIAGNOSIS — Z87891 Personal history of nicotine dependence: Secondary | ICD-10-CM | POA: Diagnosis not present

## 2019-11-25 DIAGNOSIS — I999 Unspecified disorder of circulatory system: Secondary | ICD-10-CM | POA: Diagnosis not present

## 2019-11-25 LAB — SARS CORONAVIRUS 2 (TAT 6-24 HRS): SARS Coronavirus 2: NEGATIVE

## 2019-11-25 NOTE — Progress Notes (Signed)
Patient name: Marcus [name] MRN: [id number] DOB: 1951-04-11 Sex: male  HPI: Marcus [name] is a 69 y.o. male, a 1 to 2-week history of increasing pain in his right foot.  He states that this hurts continuously 24 hours a day.  He also has noted that his right calf becomes tight after walking less than a block.  He is a former smoker and quit about 10 years ago.  He is currently on Plavix aspirin and a statin.  He previously had an arteriogram performed by Dr. Trula Slade in January 2020.  This showed occlusion of the proximal tibial vessels with one-vessel runoff to the peroneal to the right foot.  At that time patient was having claudication symptoms and he was managed medically.  Of note he had similar findings in the left leg.  Other medical problems include multijoint arthritis, hyperlipidemia hypertension all of which are currently stable  Past Medical History:  Diagnosis Date  . Arthritis   . Cervical spine disease   . CHF (congestive heart failure) ([place])   . Depression   . GERD (gastroesophageal reflux disease)   . Hyperlipidemia   . Hypertension    Past Surgical History:  Procedure Laterality Date  . ABDOMINAL AORTOGRAM W/LOWER EXTREMITY Bilateral 09/15/2018   Procedure: ABDOMINAL AORTOGRAM W/LOWER EXTREMITY;  Surgeon: Serafina Mitchell, MD;  Location: [facility];  Service: Cardiovascular;  Laterality: Bilateral;  . breast lump removal    . LEFT HEART CATHETERIZATION WITH CORONARY/GRAFT ANGIOGRAM Right 05/01/2012   Procedure: LEFT HEART CATHETERIZATION WITH Beatrix Fetters;  Surgeon: Birdie Riddle, MD;  Location: [facility];  Service: Cardiovascular;  Laterality: Right;  . triple bypass      Family History  Problem Relation Age of Onset  . Kidney disease Mother   . Asthma Mother   . Hypertension Father     SOCIAL HISTORY: Social History   Socioeconomic History  . Marital status: Married    Spouse name: Not on file  . Number of children: Not on file  .  Years of education: Not on file  . Highest education level: Not on file  Occupational History  . Not on file  Tobacco Use  . Smoking status: Former Research scientist (life sciences)  . Smokeless tobacco: Never Used  Substance and Sexual Activity  . Alcohol use: Yes    Comment: occ  . Drug use: Yes    Types: Marijuana    Comment: "every now and then" last used 09/09/13  . Sexual activity: Not on file  Other Topics Concern  . Not on file  Social History Narrative  . Not on file   Social Determinants of Health   Financial Resource Strain:   . Difficulty of Paying Living Expenses:   Food Insecurity:   . Worried About Charity fundraiser in the Last Year:   . Arboriculturist in the Last Year:   Transportation Needs:   . Film/video editor (Medical):   Marland Kitchen Lack of Transportation (Non-Medical):   Physical Activity:   . Days of Exercise per Week:   . Minutes of Exercise per Session:   Stress:   . Feeling of Stress :   Social Connections:   . Frequency of Communication with Friends and Family:   . Frequency of Social Gatherings with Friends and Family:   . Attends Religious Services:   . Active Member of Clubs or Organizations:   . Attends Archivist Meetings:   Marland Kitchen Marital Status:  Intimate Partner Violence:   . Fear of Current or Ex-Partner:   . Emotionally Abused:   Marland Kitchen Physically Abused:   . Sexually Abused:     Allergies  Allergen Reactions  . Penicillins Hives and Other (See Comments)    Has patient had a PCN reaction causing immediate rash, facial/tongue/throat swelling, SOB or lightheadedness with hypotension: No Has patient had a PCN reaction causing severe rash involving mucus membranes or skin necrosis: No Has patient had a PCN reaction that required hospitalization No Has patient had a PCN reaction occurring within the last 10 years: No If all of the above answers are "NO", then may proceed with Cephalosporin use.  . Tylenol [Acetaminophen] Itching    Current Outpatient  Medications  Medication Sig Dispense Refill  . aspirin EC 81 MG tablet Take 81 mg by mouth 3 (three) times a week.    . cetirizine (ZYRTEC) 10 MG tablet Take 10 mg by mouth daily as needed for allergies.     . Cholecalciferol (VITAMIN D-3) 125 MCG (5000 UT) TABS Take 5,000 Units by mouth daily.    . cilostazol (PLETAL) 100 MG tablet Take 1 tablet (100 mg total) by mouth 2 (two) times daily before a meal. 60 tablet 11  . citalopram (CELEXA) 10 MG tablet Take 10 mg by mouth daily.   2  . clonazePAM (KLONOPIN) 0.5 MG tablet     . clopidogrel (PLAVIX) 75 MG tablet Take 1 tablet (75 mg total) by mouth at bedtime. 30 tablet 3  . gentamicin cream (GARAMYCIN) 0.1 % Apply 1 application topically 2 (two) times daily. 30 g 1  . HYDROcodone-acetaminophen (NORCO) 10-325 MG tablet     . lisinopril (PRINIVIL,ZESTRIL) 2.5 MG tablet Take 2.5 mg by mouth daily.     . Menthol-Methyl Salicylate (MUSCLE RUB) 10-15 % CREA Apply 1 application topically as needed for muscle pain.    . metoprolol tartrate (LOPRESSOR) 25 MG tablet Take 25 mg by mouth 2 (two) times daily.    . montelukast (SINGULAIR) 10 MG tablet     . omeprazole (PRILOSEC) 20 MG capsule Take 20 mg by mouth 2 (two) times daily.     . pregabalin (LYRICA) 75 MG capsule     . rOPINIRole (REQUIP) 1 MG tablet     . simvastatin (ZOCOR) 40 MG tablet Take 1 tablet (40 mg total) by mouth at bedtime. 30 tablet 3  . tamsulosin (FLOMAX) 0.4 MG CAPS capsule Take 0.4 mg by mouth daily.     . traMADol (ULTRAM) 50 MG tablet Take 50 mg by mouth every 6 (six) hours as needed (pain).   2  . zolpidem (AMBIEN) 10 MG tablet Take 10 mg by mouth at bedtime as needed for sleep.      No current facility-administered medications for this visit.    ROS:   General:  No weight loss, Fever, chills  HEENT: No recent headaches, no nasal bleeding, no visual changes, no sore throat  Neurologic: No dizziness, blackouts, seizures. No recent symptoms of stroke or mini- stroke. No  recent episodes of slurred speech, or temporary blindness.  Cardiac: No recent episodes of chest pain/pressure, no shortness of breath at rest.  No shortness of breath with exertion.  Denies history of atrial fibrillation or irregular heartbeat  Vascular: + history of rest pain in feet.  + history of claudication.  No history of non-healing ulcer, No history of DVT   Pulmonary: No home oxygen, no productive cough, no hemoptysis,  No asthma  or wheezing  Musculoskeletal:  [X] Arthritis, [X] Low back pain,  [X] Joint pain  Hematologic:No history of hypercoagulable state.  No history of easy bleeding.  No history of anemia  Gastrointestinal: No hematochezia or melena,  No gastroesophageal reflux, no trouble swallowing  Urinary: [ ] chronic Kidney disease, [ ] on HD - [ ] MWF or [ ] TTHS, [ ] Burning with urination, [ ] Frequent urination, [ ] Difficulty urinating;   Skin: No rashes  Psychological: No history of anxiety,  No history of depression   Physical Examination  Vitals:   11/25/19 1150  BP: 119/70  Pulse: 73  Resp: 20  Temp: 98.1 F (36.7 C)  SpO2: 97%  Weight: 178 lb (80.7 kg)  Height: 5' 7" (1.702 m)    Body mass index is 27.88 kg/m.  General:  Alert and oriented, no acute distress HEENT: Normal Neck: No bruit Cardiac: Regular Rate and Rhythm Abdomen: Soft, non-tender, non-distended, no mass Skin: No rash or ulcer Extremity Pulses:  2+ radial, brachial, femoral, absent popliteal dorsalis pedis, posterior tibial pulses bilaterally Musculoskeletal: No deformity or edema  Neurologic: Upper and lower extremity motor 5/5 and symmetric  DATA:  Patient had a bilateral ABI performed today.  His ABI on the right side has decreased from 0.84 to 0.5.  Left side has slightly increased from 0.93 to 1.15  ASSESSMENT: Worsening symptoms now with rest pain right foot secondary to peripheral arterial disease.  With patient's rest pain and very poor flow from arteriogram a year  ago this potentially could be a limb threatening situation.   PLAN: Aortogram lower extremity runoff possible intervention on November 26, 2019.  Risk benefits possible complications of procedure details including but not limited to bleeding infection vessel injury contrast reaction were discussed with patient today.  He understands and wishes to proceed.  Most likely this will be a diagnostic arteriogram for planning purposes for potential bypass operation.  The patient understands that if he requires bypass operation it could be of limited durability and he could be faced again with a limb threatening situation.   Shirly Bartosiewicz, MD [facility] Office: [phone] Pager: [phone]   

## 2019-11-25 NOTE — H&P (View-Only) (Signed)
Patient name: Marcus [name] MRN: [id number] DOB: 1951-04-11 Sex: male  HPI: Marcus [name] is a 69 y.o. male, a 1 to 2-week history of increasing pain in his right foot.  He states that this hurts continuously 24 hours a day.  He also has noted that his right calf becomes tight after walking less than a block.  He is a former smoker and quit about 10 years ago.  He is currently on Plavix aspirin and a statin.  He previously had an arteriogram performed by Dr. Trula Slade in January 2020.  This showed occlusion of the proximal tibial vessels with one-vessel runoff to the peroneal to the right foot.  At that time patient was having claudication symptoms and he was managed medically.  Of note he had similar findings in the left leg.  Other medical problems include multijoint arthritis, hyperlipidemia hypertension all of which are currently stable  Past Medical History:  Diagnosis Date  . Arthritis   . Cervical spine disease   . CHF (congestive heart failure) ([place])   . Depression   . GERD (gastroesophageal reflux disease)   . Hyperlipidemia   . Hypertension    Past Surgical History:  Procedure Laterality Date  . ABDOMINAL AORTOGRAM W/LOWER EXTREMITY Bilateral 09/15/2018   Procedure: ABDOMINAL AORTOGRAM W/LOWER EXTREMITY;  Surgeon: Serafina Mitchell, MD;  Location: [facility];  Service: Cardiovascular;  Laterality: Bilateral;  . breast lump removal    . LEFT HEART CATHETERIZATION WITH CORONARY/GRAFT ANGIOGRAM Right 05/01/2012   Procedure: LEFT HEART CATHETERIZATION WITH Beatrix Fetters;  Surgeon: Birdie Riddle, MD;  Location: [facility];  Service: Cardiovascular;  Laterality: Right;  . triple bypass      Family History  Problem Relation Age of Onset  . Kidney disease Mother   . Asthma Mother   . Hypertension Father     SOCIAL HISTORY: Social History   Socioeconomic History  . Marital status: Married    Spouse name: Not on file  . Number of children: Not on file  .  Years of education: Not on file  . Highest education level: Not on file  Occupational History  . Not on file  Tobacco Use  . Smoking status: Former Research scientist (life sciences)  . Smokeless tobacco: Never Used  Substance and Sexual Activity  . Alcohol use: Yes    Comment: occ  . Drug use: Yes    Types: Marijuana    Comment: "every now and then" last used 09/09/13  . Sexual activity: Not on file  Other Topics Concern  . Not on file  Social History Narrative  . Not on file   Social Determinants of Health   Financial Resource Strain:   . Difficulty of Paying Living Expenses:   Food Insecurity:   . Worried About Charity fundraiser in the Last Year:   . Arboriculturist in the Last Year:   Transportation Needs:   . Film/video editor (Medical):   Marland Kitchen Lack of Transportation (Non-Medical):   Physical Activity:   . Days of Exercise per Week:   . Minutes of Exercise per Session:   Stress:   . Feeling of Stress :   Social Connections:   . Frequency of Communication with Friends and Family:   . Frequency of Social Gatherings with Friends and Family:   . Attends Religious Services:   . Active Member of Clubs or Organizations:   . Attends Archivist Meetings:   Marland Kitchen Marital Status:  Intimate Partner Violence:   . Fear of Current or Ex-Partner:   . Emotionally Abused:   Marland Kitchen Physically Abused:   . Sexually Abused:     Allergies  Allergen Reactions  . Penicillins Hives and Other (See Comments)    Has patient had a PCN reaction causing immediate rash, facial/tongue/throat swelling, SOB or lightheadedness with hypotension: No Has patient had a PCN reaction causing severe rash involving mucus membranes or skin necrosis: No Has patient had a PCN reaction that required hospitalization No Has patient had a PCN reaction occurring within the last 10 years: No If all of the above answers are "NO", then may proceed with Cephalosporin use.  . Tylenol [Acetaminophen] Itching    Current Outpatient  Medications  Medication Sig Dispense Refill  . aspirin EC 81 MG tablet Take 81 mg by mouth 3 (three) times a week.    . cetirizine (ZYRTEC) 10 MG tablet Take 10 mg by mouth daily as needed for allergies.     . Cholecalciferol (VITAMIN D-3) 125 MCG (5000 UT) TABS Take 5,000 Units by mouth daily.    . cilostazol (PLETAL) 100 MG tablet Take 1 tablet (100 mg total) by mouth 2 (two) times daily before a meal. 60 tablet 11  . citalopram (CELEXA) 10 MG tablet Take 10 mg by mouth daily.   2  . clonazePAM (KLONOPIN) 0.5 MG tablet     . clopidogrel (PLAVIX) 75 MG tablet Take 1 tablet (75 mg total) by mouth at bedtime. 30 tablet 3  . gentamicin cream (GARAMYCIN) 0.1 % Apply 1 application topically 2 (two) times daily. 30 g 1  . HYDROcodone-acetaminophen (NORCO) 10-325 MG tablet     . lisinopril (PRINIVIL,ZESTRIL) 2.5 MG tablet Take 2.5 mg by mouth daily.     . Menthol-Methyl Salicylate (MUSCLE RUB) 10-15 % CREA Apply 1 application topically as needed for muscle pain.    . metoprolol tartrate (LOPRESSOR) 25 MG tablet Take 25 mg by mouth 2 (two) times daily.    . montelukast (SINGULAIR) 10 MG tablet     . omeprazole (PRILOSEC) 20 MG capsule Take 20 mg by mouth 2 (two) times daily.     . pregabalin (LYRICA) 75 MG capsule     . rOPINIRole (REQUIP) 1 MG tablet     . simvastatin (ZOCOR) 40 MG tablet Take 1 tablet (40 mg total) by mouth at bedtime. 30 tablet 3  . tamsulosin (FLOMAX) 0.4 MG CAPS capsule Take 0.4 mg by mouth daily.     . traMADol (ULTRAM) 50 MG tablet Take 50 mg by mouth every 6 (six) hours as needed (pain).   2  . zolpidem (AMBIEN) 10 MG tablet Take 10 mg by mouth at bedtime as needed for sleep.      No current facility-administered medications for this visit.    ROS:   General:  No weight loss, Fever, chills  HEENT: No recent headaches, no nasal bleeding, no visual changes, no sore throat  Neurologic: No dizziness, blackouts, seizures. No recent symptoms of stroke or mini- stroke. No  recent episodes of slurred speech, or temporary blindness.  Cardiac: No recent episodes of chest pain/pressure, no shortness of breath at rest.  No shortness of breath with exertion.  Denies history of atrial fibrillation or irregular heartbeat  Vascular: + history of rest pain in feet.  + history of claudication.  No history of non-healing ulcer, No history of DVT   Pulmonary: No home oxygen, no productive cough, no hemoptysis,  No asthma  or wheezing  Musculoskeletal:  [X]  Arthritis, [X]  Low back pain,  [X]  Joint pain  Hematologic:No history of hypercoagulable state.  No history of easy bleeding.  No history of anemia  Gastrointestinal: No hematochezia or melena,  No gastroesophageal reflux, no trouble swallowing  Urinary: [ ]  chronic Kidney disease, [ ]  on HD - [ ]  MWF or [ ]  TTHS, [ ]  Burning with urination, [ ]  Frequent urination, [ ]  Difficulty urinating;   Skin: No rashes  Psychological: No history of anxiety,  No history of depression   Physical Examination  Vitals:   11/25/19 1150  BP: 119/70  Pulse: 73  Resp: 20  Temp: 98.1 F (36.7 C)  SpO2: 97%  Weight: 178 lb (80.7 kg)  Height: 5\' 7"  (1.702 m)    Body mass index is 27.88 kg/m.  General:  Alert and oriented, no acute distress HEENT: Normal Neck: No bruit Cardiac: Regular Rate and Rhythm Abdomen: Soft, non-tender, non-distended, no mass Skin: No rash or ulcer Extremity Pulses:  2+ radial, brachial, femoral, absent popliteal dorsalis pedis, posterior tibial pulses bilaterally Musculoskeletal: No deformity or edema  Neurologic: Upper and lower extremity motor 5/5 and symmetric  DATA:  Patient had a bilateral ABI performed today.  His ABI on the right side has decreased from 0.84 to 0.5.  Left side has slightly increased from 0.93 to 1.15  ASSESSMENT: Worsening symptoms now with rest pain right foot secondary to peripheral arterial disease.  With patient's rest pain and very poor flow from arteriogram a year  ago this potentially could be a limb threatening situation.   PLAN: Aortogram lower extremity runoff possible intervention on November 26, 2019.  Risk benefits possible complications of procedure details including but not limited to bleeding infection vessel injury contrast reaction were discussed with patient today.  He understands and wishes to proceed.  Most likely this will be a diagnostic arteriogram for planning purposes for potential bypass operation.  The patient understands that if he requires bypass operation it could be of limited durability and he could be faced again with a limb threatening situation.   , MD [facility] Office: [phone] Pager: [phone]

## 2019-11-25 NOTE — H&P (View-Only) (Signed)
Patient name: Marcus [name] MRN: [id number] DOB: 1951-04-11 Sex: male  HPI: Marcus [name] is a 69 y.o. male, a 1 to 2-week history of increasing pain in his right foot.  He states that this hurts continuously 24 hours a day.  He also has noted that his right calf becomes tight after walking less than a block.  He is a former smoker and quit about 10 years ago.  He is currently on Plavix aspirin and a statin.  He previously had an arteriogram performed by Dr. Trula Slade in January 2020.  This showed occlusion of the proximal tibial vessels with one-vessel runoff to the peroneal to the right foot.  At that time patient was having claudication symptoms and he was managed medically.  Of note he had similar findings in the left leg.  Other medical problems include multijoint arthritis, hyperlipidemia hypertension all of which are currently stable  Past Medical History:  Diagnosis Date  . Arthritis   . Cervical spine disease   . CHF (congestive heart failure) ([place])   . Depression   . GERD (gastroesophageal reflux disease)   . Hyperlipidemia   . Hypertension    Past Surgical History:  Procedure Laterality Date  . ABDOMINAL AORTOGRAM W/LOWER EXTREMITY Bilateral 09/15/2018   Procedure: ABDOMINAL AORTOGRAM W/LOWER EXTREMITY;  Surgeon: Serafina Mitchell, MD;  Location: [facility];  Service: Cardiovascular;  Laterality: Bilateral;  . breast lump removal    . LEFT HEART CATHETERIZATION WITH CORONARY/GRAFT ANGIOGRAM Right 05/01/2012   Procedure: LEFT HEART CATHETERIZATION WITH Beatrix Fetters;  Surgeon: Birdie Riddle, MD;  Location: [facility];  Service: Cardiovascular;  Laterality: Right;  . triple bypass      Family History  Problem Relation Age of Onset  . Kidney disease Mother   . Asthma Mother   . Hypertension Father     SOCIAL HISTORY: Social History   Socioeconomic History  . Marital status: Married    Spouse name: Not on file  . Number of children: Not on file  .  Years of education: Not on file  . Highest education level: Not on file  Occupational History  . Not on file  Tobacco Use  . Smoking status: Former Research scientist (life sciences)  . Smokeless tobacco: Never Used  Substance and Sexual Activity  . Alcohol use: Yes    Comment: occ  . Drug use: Yes    Types: Marijuana    Comment: "every now and then" last used 09/09/13  . Sexual activity: Not on file  Other Topics Concern  . Not on file  Social History Narrative  . Not on file   Social Determinants of Health   Financial Resource Strain:   . Difficulty of Paying Living Expenses:   Food Insecurity:   . Worried About Charity fundraiser in the Last Year:   . Arboriculturist in the Last Year:   Transportation Needs:   . Film/video editor (Medical):   Marland Kitchen Lack of Transportation (Non-Medical):   Physical Activity:   . Days of Exercise per Week:   . Minutes of Exercise per Session:   Stress:   . Feeling of Stress :   Social Connections:   . Frequency of Communication with Friends and Family:   . Frequency of Social Gatherings with Friends and Family:   . Attends Religious Services:   . Active Member of Clubs or Organizations:   . Attends Archivist Meetings:   Marland Kitchen Marital Status:  Intimate Partner Violence:   . Fear of Current or Ex-Partner:   . Emotionally Abused:   Marland Kitchen Physically Abused:   . Sexually Abused:     Allergies  Allergen Reactions  . Penicillins Hives and Other (See Comments)    Has patient had a PCN reaction causing immediate rash, facial/tongue/throat swelling, SOB or lightheadedness with hypotension: No Has patient had a PCN reaction causing severe rash involving mucus membranes or skin necrosis: No Has patient had a PCN reaction that required hospitalization No Has patient had a PCN reaction occurring within the last 10 years: No If all of the above answers are "NO", then may proceed with Cephalosporin use.  . Tylenol [Acetaminophen] Itching    Current Outpatient  Medications  Medication Sig Dispense Refill  . aspirin EC 81 MG tablet Take 81 mg by mouth 3 (three) times a week.    . cetirizine (ZYRTEC) 10 MG tablet Take 10 mg by mouth daily as needed for allergies.     . Cholecalciferol (VITAMIN D-3) 125 MCG (5000 UT) TABS Take 5,000 Units by mouth daily.    . cilostazol (PLETAL) 100 MG tablet Take 1 tablet (100 mg total) by mouth 2 (two) times daily before a meal. 60 tablet 11  . citalopram (CELEXA) 10 MG tablet Take 10 mg by mouth daily.   2  . clonazePAM (KLONOPIN) 0.5 MG tablet     . clopidogrel (PLAVIX) 75 MG tablet Take 1 tablet (75 mg total) by mouth at bedtime. 30 tablet 3  . gentamicin cream (GARAMYCIN) 0.1 % Apply 1 application topically 2 (two) times daily. 30 g 1  . HYDROcodone-acetaminophen (NORCO) 10-325 MG tablet     . lisinopril (PRINIVIL,ZESTRIL) 2.5 MG tablet Take 2.5 mg by mouth daily.     . Menthol-Methyl Salicylate (MUSCLE RUB) 10-15 % CREA Apply 1 application topically as needed for muscle pain.    . metoprolol tartrate (LOPRESSOR) 25 MG tablet Take 25 mg by mouth 2 (two) times daily.    . montelukast (SINGULAIR) 10 MG tablet     . omeprazole (PRILOSEC) 20 MG capsule Take 20 mg by mouth 2 (two) times daily.     . pregabalin (LYRICA) 75 MG capsule     . rOPINIRole (REQUIP) 1 MG tablet     . simvastatin (ZOCOR) 40 MG tablet Take 1 tablet (40 mg total) by mouth at bedtime. 30 tablet 3  . tamsulosin (FLOMAX) 0.4 MG CAPS capsule Take 0.4 mg by mouth daily.     . traMADol (ULTRAM) 50 MG tablet Take 50 mg by mouth every 6 (six) hours as needed (pain).   2  . zolpidem (AMBIEN) 10 MG tablet Take 10 mg by mouth at bedtime as needed for sleep.      No current facility-administered medications for this visit.    ROS:   General:  No weight loss, Fever, chills  HEENT: No recent headaches, no nasal bleeding, no visual changes, no sore throat  Neurologic: No dizziness, blackouts, seizures. No recent symptoms of stroke or mini- stroke. No  recent episodes of slurred speech, or temporary blindness.  Cardiac: No recent episodes of chest pain/pressure, no shortness of breath at rest.  No shortness of breath with exertion.  Denies history of atrial fibrillation or irregular heartbeat  Vascular: + history of rest pain in feet.  + history of claudication.  No history of non-healing ulcer, No history of DVT   Pulmonary: No home oxygen, no productive cough, no hemoptysis,  No asthma  or wheezing  Musculoskeletal:  [X] Arthritis, [X] Low back pain,  [X] Joint pain  Hematologic:No history of hypercoagulable state.  No history of easy bleeding.  No history of anemia  Gastrointestinal: No hematochezia or melena,  No gastroesophageal reflux, no trouble swallowing  Urinary: [ ] chronic Kidney disease, [ ] on HD - [ ] MWF or [ ] TTHS, [ ] Burning with urination, [ ] Frequent urination, [ ] Difficulty urinating;   Skin: No rashes  Psychological: No history of anxiety,  No history of depression   Physical Examination  Vitals:   11/25/19 1150  BP: 119/70  Pulse: 73  Resp: 20  Temp: 98.1 F (36.7 C)  SpO2: 97%  Weight: 178 lb (80.7 kg)  Height: 5' 7" (1.702 m)    Body mass index is 27.88 kg/m.  General:  Alert and oriented, no acute distress HEENT: Normal Neck: No bruit Cardiac: Regular Rate and Rhythm Abdomen: Soft, non-tender, non-distended, no mass Skin: No rash or ulcer Extremity Pulses:  2+ radial, brachial, femoral, absent popliteal dorsalis pedis, posterior tibial pulses bilaterally Musculoskeletal: No deformity or edema  Neurologic: Upper and lower extremity motor 5/5 and symmetric  DATA:  Patient had a bilateral ABI performed today.  His ABI on the right side has decreased from 0.84 to 0.5.  Left side has slightly increased from 0.93 to 1.15  ASSESSMENT: Worsening symptoms now with rest pain right foot secondary to peripheral arterial disease.  With patient's rest pain and very poor flow from arteriogram a year  ago this potentially could be a limb threatening situation.   PLAN: Aortogram lower extremity runoff possible intervention on November 26, 2019.  Risk benefits possible complications of procedure details including but not limited to bleeding infection vessel injury contrast reaction were discussed with patient today.  He understands and wishes to proceed.  Most likely this will be a diagnostic arteriogram for planning purposes for potential bypass operation.  The patient understands that if he requires bypass operation it could be of limited durability and he could be faced again with a limb threatening situation.   Brendaliz Kuk, MD [facility] Office: [phone] Pager: [phone]   

## 2019-11-26 ENCOUNTER — Other Ambulatory Visit: Payer: Self-pay

## 2019-11-26 ENCOUNTER — Ambulatory Visit (HOSPITAL_COMMUNITY)
Admission: RE | Admit: 2019-11-26 | Discharge: 2019-11-26 | Disposition: A | Discharge status: Home / Self Care | Payer: Medicare HMO | Attending: Vascular Surgery | Admitting: Vascular Surgery

## 2019-11-26 ENCOUNTER — Encounter (HOSPITAL_COMMUNITY)
Admission: RE | Disposition: A | Discharge status: Home / Self Care | Payer: Self-pay | Source: Home / Self Care | Attending: Vascular Surgery

## 2019-11-26 DIAGNOSIS — F329 Major depressive disorder, single episode, unspecified: Secondary | ICD-10-CM | POA: Diagnosis not present

## 2019-11-26 DIAGNOSIS — E785 Hyperlipidemia, unspecified: Secondary | ICD-10-CM | POA: Diagnosis not present

## 2019-11-26 DIAGNOSIS — I70221 Atherosclerosis of native arteries of extremities with rest pain, right leg: Secondary | ICD-10-CM | POA: Diagnosis not present

## 2019-11-26 DIAGNOSIS — Z79899 Other long term (current) drug therapy: Secondary | ICD-10-CM | POA: Insufficient documentation

## 2019-11-26 DIAGNOSIS — Z880 Allergy status to penicillin: Secondary | ICD-10-CM | POA: Diagnosis not present

## 2019-11-26 DIAGNOSIS — I509 Heart failure, unspecified: Secondary | ICD-10-CM | POA: Diagnosis not present

## 2019-11-26 DIAGNOSIS — Z7902 Long term (current) use of antithrombotics/antiplatelets: Secondary | ICD-10-CM | POA: Insufficient documentation

## 2019-11-26 DIAGNOSIS — Z7982 Long term (current) use of aspirin: Secondary | ICD-10-CM | POA: Insufficient documentation

## 2019-11-26 DIAGNOSIS — I70213 Atherosclerosis of native arteries of extremities with intermittent claudication, bilateral legs: Secondary | ICD-10-CM | POA: Insufficient documentation

## 2019-11-26 DIAGNOSIS — K219 Gastro-esophageal reflux disease without esophagitis: Secondary | ICD-10-CM | POA: Diagnosis not present

## 2019-11-26 DIAGNOSIS — Z87891 Personal history of nicotine dependence: Secondary | ICD-10-CM | POA: Insufficient documentation

## 2019-11-26 DIAGNOSIS — I110 Hypertensive heart disease with heart failure: Secondary | ICD-10-CM | POA: Insufficient documentation

## 2019-11-26 HISTORY — PX: ABDOMINAL AORTOGRAM W/LOWER EXTREMITY: CATH118223

## 2019-11-26 LAB — POCT I-STAT, CHEM 8
BUN: 31 mg/dL — ABNORMAL HIGH (ref 8–23)
Calcium, Ion: 1.08 mmol/L — ABNORMAL LOW (ref 1.15–1.40)
Chloride: 109 mmol/L (ref 98–111)
Creatinine, Ser: 1.5 mg/dL — ABNORMAL HIGH (ref 0.61–1.24)
Glucose, Bld: 99 mg/dL (ref 70–99)
HCT: 42 % (ref 39.0–52.0)
Hemoglobin: 14.3 g/dL (ref 13.0–17.0)
Potassium: 5.1 mmol/L (ref 3.5–5.1)
Sodium: 139 mmol/L (ref 135–145)
TCO2: 29 mmol/L (ref 22–32)

## 2019-11-26 SURGERY — ABDOMINAL AORTOGRAM W/LOWER EXTREMITY
Anesthesia: LOCAL

## 2019-11-26 MED ORDER — SODIUM CHLORIDE 0.9 % IV SOLN: INTRAVENOUS | Status: AC

## 2019-11-26 MED ORDER — HYDRALAZINE HCL 20 MG/ML IJ SOLN: 5.0000 mg | INTRAMUSCULAR | Status: DC | PRN

## 2019-11-26 MED ORDER — OXYCODONE HCL 5 MG PO TABS
5.0000 mg | ORAL_TABLET | ORAL | Status: DC | PRN
  Administered 2019-11-26: 10 mg via ORAL
  Filled 2019-11-26: qty 2

## 2019-11-26 MED ORDER — HEPARIN (PORCINE) IN NACL 1000-0.9 UT/500ML-% IV SOLN
INTRAVENOUS | Status: DC | PRN
  Administered 2019-11-26 (×2): 500 mL

## 2019-11-26 MED ORDER — ONDANSETRON HCL 4 MG/2ML IJ SOLN: 4.0000 mg | Freq: Four times a day (QID) | INTRAMUSCULAR | Status: DC | PRN

## 2019-11-26 MED ORDER — MORPHINE SULFATE (PF) 2 MG/ML IV SOLN: 2.0000 mg | INTRAVENOUS | Status: DC | PRN

## 2019-11-26 MED ORDER — SODIUM CHLORIDE 0.9 % IV SOLN: 250.0000 mL | INTRAVENOUS | Status: DC | PRN

## 2019-11-26 MED ORDER — IODIXANOL 320 MG/ML IV SOLN
INTRAVENOUS | Status: DC | PRN
  Administered 2019-11-26: 147 mL via INTRA_ARTERIAL

## 2019-11-26 MED ORDER — ACETAMINOPHEN 325 MG PO TABS: 650.0000 mg | ORAL_TABLET | ORAL | Status: DC | PRN

## 2019-11-26 MED ORDER — LIDOCAINE HCL (PF) 1 % IJ SOLN
INTRAMUSCULAR | Status: AC
  Filled 2019-11-26: qty 30

## 2019-11-26 MED ORDER — SODIUM CHLORIDE 0.9 % IV SOLN: INTRAVENOUS | Status: DC

## 2019-11-26 MED ORDER — LIDOCAINE HCL (PF) 1 % IJ SOLN
INTRAMUSCULAR | Status: DC | PRN
  Administered 2019-11-26: 15 mL via INTRADERMAL

## 2019-11-26 MED ORDER — LABETALOL HCL 5 MG/ML IV SOLN: 10.0000 mg | INTRAVENOUS | Status: DC | PRN

## 2019-11-26 MED ORDER — SODIUM CHLORIDE 0.9% FLUSH: 3.0000 mL | INTRAVENOUS | Status: DC | PRN

## 2019-11-26 MED ORDER — SODIUM CHLORIDE 0.9% FLUSH: 3.0000 mL | Freq: Two times a day (BID) | INTRAVENOUS | Status: DC

## 2019-11-26 MED ORDER — HEPARIN (PORCINE) IN NACL 1000-0.9 UT/500ML-% IV SOLN
INTRAVENOUS | Status: AC
  Filled 2019-11-26: qty 1000

## 2019-11-26 SURGICAL SUPPLY — 8 items
CATH ANGIO 5F PIGTAIL 65CM (CATHETERS) ×1 IMPLANT
KIT PV (KITS) ×2 IMPLANT
SHEATH PINNACLE 5F 10CM (SHEATH) ×1 IMPLANT
SHEATH PROBE COVER 6X72 (BAG) ×1 IMPLANT
SYR MEDRAD MARK V 150ML (SYRINGE) ×1 IMPLANT
TRANSDUCER W/STOPCOCK (MISCELLANEOUS) ×2 IMPLANT
TRAY PV CATH (CUSTOM PROCEDURE TRAY) ×2 IMPLANT
WIRE BENTSON .035X145CM (WIRE) ×1 IMPLANT

## 2019-11-26 NOTE — Progress Notes (Signed)
Up and walked and tolerated well; right groin stable, no bleeding or hematoma 

## 2019-11-26 NOTE — Op Note (Addendum)
Procedure: Abdominal aortogram with bilateral lower extremity runoff  Preoperative diagnosis: Right foot ischemia  Postoperative diagnosis: Same  Anesthesia: Local  Operative findings: 1.  Right leg superficial femoral artery occlusion with reconstitution of popliteal island followed by occlusion of origins of all 3 tibial vessels one-vessel peroneal runoff to the right foot  2.  Left tibial origin occlusions with one-vessel posterior tibial runoff to the left foot  Operative details: After obtaining form consent, the patient taken the PV lab.  The patient was placed in supine position angio table.  Both groins were prepped and draped in usual sterile fashion.  Local anesthesia was then treated of the left common femoral artery.  Ultrasound was used to identify the left common femoral artery and femoral bifurcation.  An introducer needle was then used to cannulate the left common femoral artery and an 035 Bentson wire threaded up the abdominal aorta under fluoroscopic guidance.  Next a 5 French sheath placed over the guidewire in the left common femoral artery.  This was thoroughly flushed with heparinized saline.  A 5 French pigtail catheter was advanced up in the abdominal aorta abdominal aortogram was obtained in AP projection.  Left and right renal arteries are patent.  Infrarenal abdominal aorta is patent.  Left and right common external and internal iliac arteries are all widely patent.  Next pigtail catheter was pulled on distal by the aortic bifurcation and bilateral lower extremity runoff views were obtained through the pigtail catheter.  In the right lower extremity, the right common femoral profunda femoris is patent.  There is a flush occlusion of the right superficial femoral artery.  There is an [place] of distal SFA and popliteal that reconstitutes via profunda collaterals.  This extends down to the knee joint but then occludes again.  Below the knee joint the below-knee popliteal artery  is occluded.  The anterior tibial artery is occluded.  The posterior tibial artery is occluded.  The peroneal artery does reconstitute via collaterals and is the sole of the runoff vessel to the right foot.  In the left lower extremity, the left common femoral profunda femoris is patent.  The left superficial femoral artery is patent.  The distal left popliteal artery is patent the origins of all 3 tibial vessels are occluded on the left side.  There is one-vessel posterior tibial runoff to the left foot.  At this point pigtail catheter was removed over guidewire.  5 French sheath left in place to be pulled the holding area.  Patient tolerated procedure well and there were no complications.  Patient was taken the holding area in stable condition.  Operative management: Patient will have cardiac risk stratification by Dr. Algie Coffer next week.  Our plan will be to do a right femoral to peroneal bypass the following week.  He will also have vein mapping ultrasound of both lower extremities next week prior to his peroneal bypass.  Fabienne Bruns, MD [facility] [place] Office: [phone]

## 2019-11-26 NOTE — Progress Notes (Signed)
Site area: Right groin a 5 french arterial sheath was removed  Site Prior to Removal:  Level 0  Pressure Applied For 20 MINUTES    Bedrest Beginning at 1430p  Manual:   Yes.    Patient Status During Pull:  stable  Post Pull Groin Site:  Level 0  Post Pull Instructions Given:  Yes.    Post Pull Pulses Present:  Yes.    Dressing Applied:  Yes.    Comments:

## 2019-11-26 NOTE — Discharge Instructions (Signed)
Femoral Site Care This sheet gives you information about how to care for yourself after your procedure. Your health care provider may also give you more specific instructions. If you have problems or questions, contact your health care provider. What can I expect after the procedure? After the procedure, it is common to have:  Bruising that usually fades within 1-2 weeks.  Tenderness at the site. Follow these instructions at home: Wound care  Follow instructions from your health care provider about how to take care of your insertion site. Make sure you: ? Wash your hands with soap and water before you change your bandage (dressing). If soap and water are not available, use hand sanitizer. ? Change your dressing as told by your health care provider. ? Leave stitches (sutures), skin glue, or adhesive strips in place. These skin closures may need to stay in place for 2 weeks or longer. If adhesive strip edges start to loosen and curl up, you may trim the loose edges. Do not remove adhesive strips completely unless your health care provider tells you to do that.  Do not take baths, swim, or use a hot tub until your health care provider approves.  You may shower 24-48 hours after the procedure or as told by your health care provider. ? Gently wash the site with plain soap and water. ? Pat the area dry with a clean towel. ? Do not rub the site. This may cause bleeding.  Do not apply powder or lotion to the site. Keep the site clean and dry.  Check your femoral site every day for signs of infection. Check for: ? Redness, swelling, or pain. ? Fluid or blood. ? Warmth. ? Pus or a bad smell. Activity  For the first 2-3 days after your procedure, or as long as directed: ? Avoid climbing stairs as much as possible. ? Do not squat.  Do not lift anything that is heavier than 10 lb (4.5 kg), or the limit that you are told, until your health care provider says that it is safe.  Rest as  directed. ? Avoid sitting for a long time without moving. Get up to take short walks every 1-2 hours.  Do not drive for 24 hours if you were given a medicine to help you relax (sedative). General instructions  Take over-the-counter and prescription medicines only as told by your health care provider.  Keep all follow-up visits as told by your health care provider. This is important. Contact a health care provider if you have:  A fever or chills.  You have redness, swelling, or pain around your insertion site. Get help right away if:  The catheter insertion area swells very fast.  You pass out.  You suddenly start to sweat or your skin gets clammy.  The catheter insertion area is bleeding, and the bleeding does not stop when you hold steady pressure on the area.  The area near or just beyond the catheter insertion site becomes pale, cool, tingly, or numb. These symptoms may represent a serious problem that is an emergency. Do not wait to see if the symptoms will go away. Get medical help right away. Call your local emergency services (911 in the [place] Do not drive yourself to the hospital. Summary  After the procedure, it is common to have bruising that usually fades within 1-2 weeks.  Check your femoral site every day for signs of infection.  Do not lift anything that is heavier than 10 lb (4.5 kg), or the   limit that you are told, until your health care provider says that it is safe. This information is not intended to replace advice given to you by your health care provider. Make sure you discuss any questions you have with your health care provider. Document Revised: 09/08/2017 Document Reviewed: 09/08/2017 Elsevier Patient Education  2020 [employer].    Stop plavix today so that it is out of your system by day of bypass

## 2019-11-26 NOTE — Interval H&P Note (Signed)
History and Physical Interval Note:  11/26/2019 11:05 AM  Marcus [name]  has presented today for surgery, with the diagnosis of claudication.  The various methods of treatment have been discussed with the patient and family. After consideration of risks, benefits and other options for treatment, the patient has consented to  Procedure(s): ABDOMINAL AORTOGRAM W/LOWER EXTREMITY (N/A) as a surgical intervention.  The patient's history has been reviewed, patient examined, no change in status, stable for surgery.  I have reviewed the patient's chart and labs.  Questions were answered to the patient's satisfaction.     Fabienne Bruns

## 2019-11-29 ENCOUNTER — Ambulatory Visit: Payer: Medicare HMO

## 2019-11-29 ENCOUNTER — Encounter (HOSPITAL_COMMUNITY): Payer: Medicare HMO

## 2019-11-30 ENCOUNTER — Other Ambulatory Visit: Payer: Self-pay

## 2019-12-06 ENCOUNTER — Other Ambulatory Visit: Payer: Self-pay

## 2019-12-06 ENCOUNTER — Encounter (HOSPITAL_COMMUNITY): Payer: Self-pay | Admitting: Vascular Surgery

## 2019-12-06 ENCOUNTER — Other Ambulatory Visit (HOSPITAL_COMMUNITY)
Admission: RE | Admit: 2019-12-06 | Discharge: 2019-12-06 | Disposition: A | Discharge status: Home / Self Care | Payer: Medicare HMO | Source: Ambulatory Visit | Attending: Vascular Surgery | Admitting: Vascular Surgery

## 2019-12-06 LAB — SARS CORONAVIRUS 2 (TAT 6-24 HRS): SARS Coronavirus 2: NEGATIVE

## 2019-12-06 NOTE — Progress Notes (Signed)
Mr. Orvis denies chest pain or shortness of breath at this time. Mr. Sandiford denies any s/s of covid; he will be tested today and then quarantine at home alone.  Mr. Doublin' PCP is Dr. Fatima Sanger , DNP at [facility] in [place].  Cardiologist is Dr. Lenard Lance. Mr. Bury reports that he saw Dr. Lenard Lance a couple weeks ago, patient states that he had an EKG and Dr Riki Altes gave permission for surgery.  I have requested EKG and office records.  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 Patient states that he chooses to not take medications when he is feeling ok, "I'm doing an experiment with the medications," then he laughed.  I asked Mr. Henard if he needs a pill container, he said no. I said to patient, I do not think your experiment is working and you needs to go back on some medications. Mr Voorhis that he stopped Plavix last Wednesday, he does not remember how long he told the Plavix before stopping.     Mr Broxterman is going for Covid test as soon \\as  we get off the phone.

## 2019-12-06 NOTE — Anesthesia Preprocedure Evaluation (Addendum)
Anesthesia Evaluation  Patient identified by MRN, date of birth, ID band Patient awake    Reviewed: Allergy & Precautions, NPO status , Patient's Chart, lab work & pertinent test results  History of Anesthesia Complications Negative for: history of anesthetic complications  Airway Mallampati: II  TM Distance: >3 FB Neck ROM: Full    Dental  (+) Dental Advisory Given, Missing   Pulmonary neg pulmonary ROS, former smoker,    Pulmonary exam normal        Cardiovascular hypertension, + CAD, + Past MI, + Cardiac Stents and + CABG  Normal cardiovascular exam  TTE 07/17/16: - Left ventricle: The cavity size was normal. Systolic function was  normal. The estimated ejection fraction was in the range of 50%  to 55%. Wall motion was normal; there were no regional wall  motion abnormalities. Doppler parameters are consistent with  abnormal left ventricular relaxation (grade 1 diastolic  dysfunction).  - Mitral valve: Calcified annulus. There was mild regurgitation.  - Left atrium: The atrium was mildly dilated   Neuro/Psych PSYCHIATRIC DISORDERS Depression CVA    GI/Hepatic Neg liver ROS, GERD  ,  Endo/Other  negative endocrine ROS  Renal/GU Renal InsufficiencyRenal disease     Musculoskeletal negative musculoskeletal ROS (+)   Abdominal   Peds  Hematology negative hematology ROS (+)   Anesthesia Other Findings Day of surgery medications reviewed with the patient.  Reproductive/Obstetrics                          Anesthesia Physical Anesthesia Plan  ASA: III  Anesthesia Plan: General   Post-op Pain Management:    Induction: Intravenous  PONV Risk Score and Plan: 3 and Ondansetron, Dexamethasone and Midazolam  Airway Management Planned: Oral ETT  Additional Equipment:   Intra-op Plan:   Post-operative Plan: Extubation in OR  Informed Consent: I have reviewed the patients  History and Physical, chart, labs and discussed the procedure including the risks, benefits and alternatives for the proposed anesthesia with the patient or authorized representative who has indicated his/her understanding and acceptance.     Dental advisory given  Plan Discussed with: Anesthesiologist and CRNA  Anesthesia Plan Comments: (See PAT note by Antionette Poles, PA-C  )      Anesthesia Quick Evaluation

## 2019-12-06 NOTE — Progress Notes (Signed)
Anesthesia Chart Review: Same day workup  Follows with cardiologist Dr. Algie Coffer for hx of CAD s/p stent to LCX in 2002 and CABG in 2011. I called Dr. Roseanne Kaufman office and was told he was seen last week for preop risk stratification. Records have been requested x 4. Initially advised records were being sent. On followup, told by Dr. Roseanne Kaufman office that fax was not working. Later advised that Dr. Algie Coffer was reviewing pt's EKG and records would be sent soon. Called at 5:15 and advised records being sent. No records received as of 5:30. Discussed case with Dr. Glade Stanford who stated if no records received then pt will be evaluated by anesthesiologist on DOS and decision made at that point regarding proceeding.   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 On Plavix, per pt holding since 3/24.  Hx CKD III. Last creatinine 1.50 11/26/19.  Will need DOS labs, eval, and EKG if not received.  EKG 08/03/18: Sinus rhythm. Rate 81. Incomplete right bundle branch block. Nonspecific T abnormalities, lateral leads. Borderline prolonged QT interval. No significant change was found  TTE 07/17/16: - Left ventricle: The cavity size was normal. Systolic function was  normal. The estimated ejection fraction was in the range of 50%  to 55%. Wall motion was normal; there were no regional wall  motion abnormalities. Doppler parameters are consistent with  abnormal left ventricular relaxation (grade 1 diastolic  dysfunction).  - Mitral valve: Calcified annulus. There was mild regurgitation.  - Left atrium: The atrium was mildly dilated.   Carotid US 07/17/16: Summary:  Findings suggest upper range 1-39% right internal carotid artery  stenosis and 1-39% left internal carotid artery stenosis  bilaterally. Vertebral arteries are patent with antegrade flow.   Marcus, [name] [facility]/Anesthesiology Phone [phone] 12/06/2019 5:32 PM

## 2019-12-07 ENCOUNTER — Inpatient Hospital Stay (HOSPITAL_COMMUNITY): Payer: Medicare HMO | Admitting: Physician Assistant

## 2019-12-07 ENCOUNTER — Inpatient Hospital Stay (HOSPITAL_COMMUNITY)
Admission: RE | Admit: 2019-12-07 | Discharge: 2019-12-09 | DRG: 254 | Disposition: A | Discharge status: Home / Self Care | Payer: Medicare HMO | Attending: Vascular Surgery | Admitting: Vascular Surgery

## 2019-12-07 ENCOUNTER — Encounter (HOSPITAL_COMMUNITY)
Admission: RE | Disposition: A | Discharge status: Home / Self Care | Payer: Self-pay | Source: Home / Self Care | Attending: Vascular Surgery

## 2019-12-07 ENCOUNTER — Encounter (HOSPITAL_COMMUNITY): Payer: Self-pay | Admitting: Vascular Surgery

## 2019-12-07 ENCOUNTER — Inpatient Hospital Stay (HOSPITAL_COMMUNITY): Payer: Medicare HMO

## 2019-12-07 DIAGNOSIS — Z7982 Long term (current) use of aspirin: Secondary | ICD-10-CM | POA: Diagnosis not present

## 2019-12-07 DIAGNOSIS — I70221 Atherosclerosis of native arteries of extremities with rest pain, right leg: Secondary | ICD-10-CM | POA: Diagnosis present

## 2019-12-07 DIAGNOSIS — I129 Hypertensive chronic kidney disease with stage 1 through stage 4 chronic kidney disease, or unspecified chronic kidney disease: Secondary | ICD-10-CM | POA: Diagnosis present

## 2019-12-07 DIAGNOSIS — K219 Gastro-esophageal reflux disease without esophagitis: Secondary | ICD-10-CM | POA: Diagnosis present

## 2019-12-07 DIAGNOSIS — I4510 Unspecified right bundle-branch block: Secondary | ICD-10-CM | POA: Diagnosis present

## 2019-12-07 DIAGNOSIS — Z87891 Personal history of nicotine dependence: Secondary | ICD-10-CM

## 2019-12-07 DIAGNOSIS — I2510 Atherosclerotic heart disease of native coronary artery without angina pectoris: Secondary | ICD-10-CM | POA: Diagnosis present

## 2019-12-07 DIAGNOSIS — E785 Hyperlipidemia, unspecified: Secondary | ICD-10-CM | POA: Diagnosis present

## 2019-12-07 DIAGNOSIS — Z8249 Family history of ischemic heart disease and other diseases of the circulatory system: Secondary | ICD-10-CM | POA: Diagnosis not present

## 2019-12-07 DIAGNOSIS — M130 Polyarthritis, unspecified: Secondary | ICD-10-CM | POA: Diagnosis present

## 2019-12-07 DIAGNOSIS — I739 Peripheral vascular disease, unspecified: Secondary | ICD-10-CM | POA: Diagnosis present

## 2019-12-07 DIAGNOSIS — Z79899 Other long term (current) drug therapy: Secondary | ICD-10-CM | POA: Diagnosis not present

## 2019-12-07 DIAGNOSIS — Z841 Family history of disorders of kidney and ureter: Secondary | ICD-10-CM | POA: Diagnosis not present

## 2019-12-07 DIAGNOSIS — Z8673 Personal history of transient ischemic attack (TIA), and cerebral infarction without residual deficits: Secondary | ICD-10-CM | POA: Diagnosis not present

## 2019-12-07 DIAGNOSIS — N183 Chronic kidney disease, stage 3 unspecified: Secondary | ICD-10-CM | POA: Diagnosis present

## 2019-12-07 DIAGNOSIS — I6523 Occlusion and stenosis of bilateral carotid arteries: Secondary | ICD-10-CM | POA: Diagnosis present

## 2019-12-07 DIAGNOSIS — Z419 Encounter for procedure for purposes other than remedying health state, unspecified: Secondary | ICD-10-CM

## 2019-12-07 DIAGNOSIS — Z825 Family history of asthma and other chronic lower respiratory diseases: Secondary | ICD-10-CM | POA: Diagnosis not present

## 2019-12-07 DIAGNOSIS — Z955 Presence of coronary angioplasty implant and graft: Secondary | ICD-10-CM

## 2019-12-07 DIAGNOSIS — Z79891 Long term (current) use of opiate analgesic: Secondary | ICD-10-CM

## 2019-12-07 DIAGNOSIS — R200 Anesthesia of skin: Secondary | ICD-10-CM | POA: Diagnosis not present

## 2019-12-07 DIAGNOSIS — R52 Pain, unspecified: Secondary | ICD-10-CM | POA: Diagnosis not present

## 2019-12-07 DIAGNOSIS — I252 Old myocardial infarction: Secondary | ICD-10-CM

## 2019-12-07 DIAGNOSIS — Z20822 Contact with and (suspected) exposure to covid-19: Secondary | ICD-10-CM | POA: Diagnosis present

## 2019-12-07 HISTORY — DX: Acute myocardial infarction, unspecified: I21.9

## 2019-12-07 HISTORY — DX: Headache, unspecified: R51.9

## 2019-12-07 HISTORY — PX: FEMORAL-POPLITEAL BYPASS GRAFT: SHX937

## 2019-12-07 HISTORY — PX: INTRAOPERATIVE ARTERIOGRAM: SHX5157

## 2019-12-07 HISTORY — PX: BYPASS GRAFT: SHX909

## 2019-12-07 HISTORY — DX: Cerebral infarction, unspecified: I63.9

## 2019-12-07 HISTORY — DX: Peripheral vascular disease, unspecified: I73.9

## 2019-12-07 LAB — COMPREHENSIVE METABOLIC PANEL
ALT: 13 U/L (ref 0–44)
AST: 20 U/L (ref 15–41)
Albumin: 3.5 g/dL (ref 3.5–5.0)
Alkaline Phosphatase: 105 U/L (ref 38–126)
Anion gap: 10 (ref 5–15)
BUN: 7 mg/dL — ABNORMAL LOW (ref 8–23)
CO2: 26 mmol/L (ref 22–32)
Calcium: 9.1 mg/dL (ref 8.9–10.3)
Chloride: 105 mmol/L (ref 98–111)
Creatinine, Ser: 1.31 mg/dL — ABNORMAL HIGH (ref 0.61–1.24)
GFR calc Af Amer: >60 mL/min (ref >60)
GFR calc non Af Amer: 55 mL/min — ABNORMAL LOW (ref >60)
Glucose, Bld: 94 mg/dL (ref 70–99)
Potassium: 4.3 mmol/L (ref 3.5–5.1)
Sodium: 141 mmol/L (ref 135–145)
Total Bilirubin: 0.7 mg/dL (ref 0.3–1.2)
Total Protein: 6.7 g/dL (ref 6.5–8.1)

## 2019-12-07 LAB — CBC
HCT: 47.1 % (ref 39.0–52.0)
Hemoglobin: 14.3 g/dL (ref 13.0–17.0)
MCH: 26.6 pg (ref 26.0–34.0)
MCHC: 30.4 g/dL (ref 30.0–36.0)
MCV: 87.5 fL (ref 80.0–100.0)
Platelets: 197 10*3/uL (ref 150–400)
RBC: 5.38 MIL/uL (ref 4.22–5.81)
RDW: 13.9 % (ref 11.5–15.5)
WBC: 5 10*3/uL (ref 4.0–10.5)
nRBC: 0 % (ref 0.0–0.2)

## 2019-12-07 LAB — SURGICAL PCR SCREEN
MRSA, PCR: NEGATIVE
Staphylococcus aureus: NEGATIVE

## 2019-12-07 LAB — TYPE AND SCREEN
ABO/RH(D): O POS
Antibody Screen: NEGATIVE

## 2019-12-07 LAB — PROTIME-INR
INR: 0.9 (ref 0.8–1.2)
Prothrombin Time: 12.1 seconds (ref 11.4–15.2)

## 2019-12-07 LAB — GLUCOSE, CAPILLARY: Glucose-Capillary: 90 mg/dL (ref 70–99)

## 2019-12-07 LAB — APTT: aPTT: 26 seconds (ref 24–36)

## 2019-12-07 SURGERY — BYPASS GRAFT FEMORAL-POPLITEAL ARTERY
Anesthesia: General | Site: Leg Upper | Laterality: Right

## 2019-12-07 MED ORDER — SODIUM CHLORIDE 0.9 % IV SOLN
INTRAVENOUS | Status: AC
  Filled 2019-12-07: qty 1.2

## 2019-12-07 MED ORDER — ROCURONIUM BROMIDE 10 MG/ML (PF) SYRINGE
PREFILLED_SYRINGE | INTRAVENOUS | Status: AC
  Filled 2019-12-07: qty 10

## 2019-12-07 MED ORDER — PREGABALIN 75 MG PO CAPS: 75.0000 mg | ORAL_CAPSULE | Freq: Three times a day (TID) | ORAL | Status: DC | PRN

## 2019-12-07 MED ORDER — HEPARIN SODIUM (PORCINE) 5000 UNIT/ML IJ SOLN
5000.0000 [IU] | Freq: Three times a day (TID) | INTRAMUSCULAR | Status: DC
  Administered 2019-12-07 – 2019-12-08 (×4): 5000 [IU] via SUBCUTANEOUS
  Filled 2019-12-07 (×4): qty 1

## 2019-12-07 MED ORDER — VANCOMYCIN HCL IN DEXTROSE 1-5 GM/200ML-% IV SOLN
1000.0000 mg | INTRAVENOUS | Status: AC
  Administered 2019-12-07: 1000 mg via INTRAVENOUS

## 2019-12-07 MED ORDER — DEXAMETHASONE SODIUM PHOSPHATE 10 MG/ML IJ SOLN
INTRAMUSCULAR | Status: AC
  Filled 2019-12-07: qty 1

## 2019-12-07 MED ORDER — ONDANSETRON HCL 4 MG/2ML IJ SOLN: 4.0000 mg | Freq: Four times a day (QID) | INTRAMUSCULAR | Status: DC | PRN

## 2019-12-07 MED ORDER — METOPROLOL TARTRATE 5 MG/5ML IV SOLN: 2.0000 mg | INTRAVENOUS | Status: DC | PRN

## 2019-12-07 MED ORDER — LABETALOL HCL 5 MG/ML IV SOLN: 10.0000 mg | INTRAVENOUS | Status: DC | PRN

## 2019-12-07 MED ORDER — FENTANYL CITRATE (PF) 100 MCG/2ML IJ SOLN: 25.0000 ug | INTRAMUSCULAR | Status: DC | PRN

## 2019-12-07 MED ORDER — PROMETHAZINE HCL 25 MG/ML IJ SOLN: 6.2500 mg | INTRAMUSCULAR | Status: DC | PRN

## 2019-12-07 MED ORDER — MUPIROCIN 2 % EX OINT
TOPICAL_OINTMENT | CUTANEOUS | Status: AC
  Administered 2019-12-07: 1 via TOPICAL
  Filled 2019-12-07: qty 22

## 2019-12-07 MED ORDER — PROTAMINE SULFATE 10 MG/ML IV SOLN
INTRAVENOUS | Status: DC | PRN
  Administered 2019-12-07: 60 mg via INTRAVENOUS
  Administered 2019-12-07: 20 mg via INTRAVENOUS

## 2019-12-07 MED ORDER — HEMOSTATIC AGENTS (NO CHARGE) OPTIME
TOPICAL | Status: DC | PRN
  Administered 2019-12-07: 1 via TOPICAL

## 2019-12-07 MED ORDER — PHENYLEPHRINE HCL-NACL 10-0.9 MG/250ML-% IV SOLN
INTRAVENOUS | Status: DC | PRN
  Administered 2019-12-07: 50 ug/min via INTRAVENOUS

## 2019-12-07 MED ORDER — DOCUSATE SODIUM 100 MG PO CAPS
100.0000 mg | ORAL_CAPSULE | Freq: Every day | ORAL | Status: DC
  Administered 2019-12-09: 100 mg via ORAL
  Filled 2019-12-07 (×3): qty 1

## 2019-12-07 MED ORDER — ROPINIROLE HCL 1 MG PO TABS
1.0000 mg | ORAL_TABLET | Freq: Every day | ORAL | Status: DC
  Administered 2019-12-07 – 2019-12-08 (×2): 1 mg via ORAL
  Filled 2019-12-07 (×2): qty 1

## 2019-12-07 MED ORDER — 0.9 % SODIUM CHLORIDE (POUR BTL) OPTIME
TOPICAL | Status: DC | PRN
  Administered 2019-12-07: 1000 mL

## 2019-12-07 MED ORDER — ASPIRIN EC 81 MG PO TBEC
81.0000 mg | DELAYED_RELEASE_TABLET | Freq: Every day | ORAL | Status: DC
  Administered 2019-12-08 – 2019-12-09 (×2): 81 mg via ORAL
  Filled 2019-12-07 (×2): qty 1

## 2019-12-07 MED ORDER — CLOPIDOGREL BISULFATE 75 MG PO TABS
75.0000 mg | ORAL_TABLET | Freq: Every day | ORAL | Status: DC
  Administered 2019-12-07 – 2019-12-08 (×2): 75 mg via ORAL
  Filled 2019-12-07 (×2): qty 1

## 2019-12-07 MED ORDER — MAGNESIUM SULFATE 2 GM/50ML IV SOLN: 2.0000 g | Freq: Every day | INTRAVENOUS | Status: DC | PRN

## 2019-12-07 MED ORDER — ROCURONIUM BROMIDE 100 MG/10ML IV SOLN
INTRAVENOUS | Status: DC | PRN
  Administered 2019-12-07: 30 mg via INTRAVENOUS
  Administered 2019-12-07: 50 mg via INTRAVENOUS
  Administered 2019-12-07: 70 mg via INTRAVENOUS
  Administered 2019-12-07: 20 mg via INTRAVENOUS

## 2019-12-07 MED ORDER — HYDRALAZINE HCL 20 MG/ML IJ SOLN: 5.0000 mg | INTRAMUSCULAR | Status: DC | PRN

## 2019-12-07 MED ORDER — ONDANSETRON HCL 4 MG/2ML IJ SOLN
INTRAMUSCULAR | Status: DC | PRN
  Administered 2019-12-07: 4 mg via INTRAVENOUS

## 2019-12-07 MED ORDER — TAMSULOSIN HCL 0.4 MG PO CAPS
0.4000 mg | ORAL_CAPSULE | Freq: Every day | ORAL | Status: DC
  Administered 2019-12-07 – 2019-12-09 (×3): 0.4 mg via ORAL
  Filled 2019-12-07 (×3): qty 1

## 2019-12-07 MED ORDER — OXYCODONE HCL 5 MG PO TABS
5.0000 mg | ORAL_TABLET | ORAL | Status: DC | PRN
  Administered 2019-12-07 – 2019-12-09 (×6): 10 mg via ORAL
  Filled 2019-12-07 (×6): qty 2

## 2019-12-07 MED ORDER — ALUM & MAG HYDROXIDE-SIMETH 200-200-20 MG/5ML PO SUSP: 15.0000 mL | ORAL | Status: DC | PRN

## 2019-12-07 MED ORDER — MIDAZOLAM HCL 5 MG/5ML IJ SOLN
INTRAMUSCULAR | Status: DC | PRN
  Administered 2019-12-07: 2 mg via INTRAVENOUS

## 2019-12-07 MED ORDER — FENTANYL CITRATE (PF) 100 MCG/2ML IJ SOLN
INTRAMUSCULAR | Status: DC | PRN
  Administered 2019-12-07: 100 ug via INTRAVENOUS
  Administered 2019-12-07: 50 ug via INTRAVENOUS
  Administered 2019-12-07: 25 ug via INTRAVENOUS

## 2019-12-07 MED ORDER — MIDAZOLAM HCL 2 MG/2ML IJ SOLN
INTRAMUSCULAR | Status: AC
  Filled 2019-12-07: qty 2

## 2019-12-07 MED ORDER — HEPARIN SODIUM (PORCINE) 1000 UNIT/ML IJ SOLN
INTRAMUSCULAR | Status: DC | PRN
  Administered 2019-12-07: 8000 [IU] via INTRAVENOUS

## 2019-12-07 MED ORDER — ACETAMINOPHEN 325 MG PO TABS
325.0000 mg | ORAL_TABLET | ORAL | Status: DC | PRN
  Administered 2019-12-08 – 2019-12-09 (×3): 650 mg via ORAL
  Filled 2019-12-07 (×3): qty 2

## 2019-12-07 MED ORDER — DEXAMETHASONE SODIUM PHOSPHATE 10 MG/ML IJ SOLN
INTRAMUSCULAR | Status: DC | PRN
  Administered 2019-12-07: 5 mg via INTRAVENOUS

## 2019-12-07 MED ORDER — EPHEDRINE 5 MG/ML INJ
INTRAVENOUS | Status: AC
  Filled 2019-12-07: qty 10

## 2019-12-07 MED ORDER — SODIUM CHLORIDE 0.9 % IV SOLN: INTRAVENOUS | Status: DC

## 2019-12-07 MED ORDER — CITALOPRAM HYDROBROMIDE 20 MG PO TABS
10.0000 mg | ORAL_TABLET | ORAL | Status: DC
  Administered 2019-12-08: 10 mg via ORAL
  Filled 2019-12-07: qty 1

## 2019-12-07 MED ORDER — ZOLPIDEM TARTRATE 5 MG PO TABS: 5.0000 mg | ORAL_TABLET | Freq: Every evening | ORAL | Status: DC | PRN

## 2019-12-07 MED ORDER — TRAZODONE HCL 100 MG PO TABS
100.0000 mg | ORAL_TABLET | Freq: Every day | ORAL | Status: DC
  Administered 2019-12-07 – 2019-12-08 (×2): 100 mg via ORAL
  Filled 2019-12-07 (×2): qty 1

## 2019-12-07 MED ORDER — GUAIFENESIN-DM 100-10 MG/5ML PO SYRP: 15.0000 mL | ORAL_SOLUTION | ORAL | Status: DC | PRN

## 2019-12-07 MED ORDER — MORPHINE SULFATE (PF) 2 MG/ML IV SOLN: 2.0000 mg | INTRAVENOUS | Status: DC | PRN

## 2019-12-07 MED ORDER — ONDANSETRON HCL 4 MG/2ML IJ SOLN
INTRAMUSCULAR | Status: AC
  Filled 2019-12-07: qty 2

## 2019-12-07 MED ORDER — MUPIROCIN 2 % EX OINT: 1.0000 "application " | TOPICAL_OINTMENT | Freq: Once | CUTANEOUS | Status: AC

## 2019-12-07 MED ORDER — POTASSIUM CHLORIDE CRYS ER 20 MEQ PO TBCR: 20.0000 meq | EXTENDED_RELEASE_TABLET | Freq: Every day | ORAL | Status: DC | PRN

## 2019-12-07 MED ORDER — LIDOCAINE 2% (20 MG/ML) 5 ML SYRINGE
INTRAMUSCULAR | Status: AC
  Filled 2019-12-07: qty 5

## 2019-12-07 MED ORDER — CHLORHEXIDINE GLUCONATE CLOTH 2 % EX PADS: 6.0000 | MEDICATED_PAD | Freq: Once | CUTANEOUS | Status: DC

## 2019-12-07 MED ORDER — SODIUM CHLORIDE 0.9 % IV SOLN: 500.0000 mL | Freq: Once | INTRAVENOUS | Status: DC | PRN

## 2019-12-07 MED ORDER — PANTOPRAZOLE SODIUM 40 MG PO TBEC
40.0000 mg | DELAYED_RELEASE_TABLET | Freq: Every day | ORAL | Status: DC
  Administered 2019-12-07 – 2019-12-09 (×3): 40 mg via ORAL
  Filled 2019-12-07 (×3): qty 1

## 2019-12-07 MED ORDER — IODIXANOL 320 MG/ML IV SOLN
INTRAVENOUS | Status: DC | PRN
  Administered 2019-12-07: 50 mL via INTRAVENOUS

## 2019-12-07 MED ORDER — PROPOFOL 10 MG/ML IV BOLUS
INTRAVENOUS | Status: DC | PRN
  Administered 2019-12-07: 150 mg via INTRAVENOUS

## 2019-12-07 MED ORDER — HEPARIN SODIUM (PORCINE) 1000 UNIT/ML IJ SOLN
INTRAMUSCULAR | Status: AC
  Filled 2019-12-07: qty 1

## 2019-12-07 MED ORDER — CLONAZEPAM 0.5 MG PO TABS: 0.5000 mg | ORAL_TABLET | Freq: Two times a day (BID) | ORAL | Status: DC | PRN

## 2019-12-07 MED ORDER — LISINOPRIL 10 MG PO TABS
10.0000 mg | ORAL_TABLET | Freq: Every day | ORAL | Status: DC
  Administered 2019-12-07 – 2019-12-09 (×3): 10 mg via ORAL
  Filled 2019-12-07 (×3): qty 1

## 2019-12-07 MED ORDER — METOPROLOL TARTRATE 12.5 MG HALF TABLET
ORAL_TABLET | ORAL | Status: AC
  Administered 2019-12-07: 25 mg via ORAL
  Filled 2019-12-07: qty 2

## 2019-12-07 MED ORDER — SIMVASTATIN 20 MG PO TABS
40.0000 mg | ORAL_TABLET | Freq: Every day | ORAL | Status: DC
  Administered 2019-12-07 – 2019-12-08 (×2): 40 mg via ORAL
  Filled 2019-12-07 (×2): qty 2

## 2019-12-07 MED ORDER — CELECOXIB 200 MG PO CAPS
200.0000 mg | ORAL_CAPSULE | Freq: Once | ORAL | Status: AC
  Administered 2019-12-07: 200 mg via ORAL
  Filled 2019-12-07: qty 1

## 2019-12-07 MED ORDER — FENTANYL CITRATE (PF) 250 MCG/5ML IJ SOLN
INTRAMUSCULAR | Status: AC
  Filled 2019-12-07: qty 5

## 2019-12-07 MED ORDER — SUGAMMADEX SODIUM 200 MG/2ML IV SOLN
INTRAVENOUS | Status: DC | PRN
  Administered 2019-12-07: 200 mg via INTRAVENOUS

## 2019-12-07 MED ORDER — SENNOSIDES-DOCUSATE SODIUM 8.6-50 MG PO TABS: 1.0000 | ORAL_TABLET | Freq: Every evening | ORAL | Status: DC | PRN

## 2019-12-07 MED ORDER — EPHEDRINE SULFATE 50 MG/ML IJ SOLN
INTRAMUSCULAR | Status: DC | PRN
  Administered 2019-12-07: 15 mg via INTRAVENOUS

## 2019-12-07 MED ORDER — SODIUM CHLORIDE 0.9 % IV SOLN
INTRAVENOUS | Status: DC | PRN
  Administered 2019-12-07: 500 mL

## 2019-12-07 MED ORDER — ACETAMINOPHEN 500 MG PO TABS
ORAL_TABLET | ORAL | Status: AC
  Administered 2019-12-07: 1000 mg via ORAL
  Filled 2019-12-07: qty 2

## 2019-12-07 MED ORDER — ALBUMIN HUMAN 5 % IV SOLN: INTRAVENOUS | Status: DC | PRN

## 2019-12-07 MED ORDER — PHENOL 1.4 % MT LIQD: 1.0000 | OROMUCOSAL | Status: DC | PRN

## 2019-12-07 MED ORDER — PROTAMINE SULFATE 10 MG/ML IV SOLN
INTRAVENOUS | Status: AC
  Filled 2019-12-07: qty 25

## 2019-12-07 MED ORDER — ACETAMINOPHEN 325 MG RE SUPP: 325.0000 mg | RECTAL | Status: DC | PRN

## 2019-12-07 MED ORDER — ACETAMINOPHEN 500 MG PO TABS: 1000.0000 mg | ORAL_TABLET | Freq: Once | ORAL | Status: AC

## 2019-12-07 MED ORDER — LIDOCAINE 2% (20 MG/ML) 5 ML SYRINGE
INTRAMUSCULAR | Status: DC | PRN
  Administered 2019-12-07: 60 mg via INTRAVENOUS

## 2019-12-07 MED ORDER — METOPROLOL TARTRATE 25 MG PO TABS
25.0000 mg | ORAL_TABLET | Freq: Two times a day (BID) | ORAL | Status: DC
  Administered 2019-12-07 – 2019-12-08 (×3): 25 mg via ORAL
  Filled 2019-12-07 (×4): qty 1

## 2019-12-07 MED ORDER — METOPROLOL TARTRATE 25 MG PO TABS
25.0000 mg | ORAL_TABLET | Freq: Once | ORAL | Status: AC
  Filled 2019-12-07: qty 1

## 2019-12-07 MED ORDER — PROPOFOL 10 MG/ML IV BOLUS
INTRAVENOUS | Status: AC
  Filled 2019-12-07: qty 40

## 2019-12-07 SURGICAL SUPPLY — 62 items
ADH SKN CLS APL DERMABOND .7 (GAUZE/BANDAGES/DRESSINGS) ×3
AGENT HMST SPONGE THK3/8 (HEMOSTASIS) ×1
BANDAGE ESMARK 6X9 LF (GAUZE/BANDAGES/DRESSINGS) IMPLANT
BNDG CMPR 9X6 STRL LF SNTH (GAUZE/BANDAGES/DRESSINGS)
BNDG ESMARK 6X9 LF (GAUZE/BANDAGES/DRESSINGS)
CANISTER SUCT 3000ML PPV (MISCELLANEOUS) ×2 IMPLANT
CANNULA VESSEL 3MM 2 BLNT TIP (CANNULA) ×4 IMPLANT
CLIP VESOCCLUDE MED 24/CT (CLIP) ×2 IMPLANT
CLIP VESOCCLUDE SM WIDE 24/CT (CLIP) ×2 IMPLANT
COVER SURGICAL LIGHT HANDLE (MISCELLANEOUS) ×1 IMPLANT
COVER WAND RF STERILE (DRAPES) ×1 IMPLANT
CUFF TOURN SGL QUICK 24 (TOURNIQUET CUFF)
CUFF TOURN SGL QUICK 34 (TOURNIQUET CUFF)
CUFF TOURN SGL QUICK 42 (TOURNIQUET CUFF) IMPLANT
CUFF TRNQT CYL 24X4X16.5-23 (TOURNIQUET CUFF) IMPLANT
CUFF TRNQT CYL 34X4.125X (TOURNIQUET CUFF) IMPLANT
DERMABOND ADVANCED (GAUZE/BANDAGES/DRESSINGS) ×3
DERMABOND ADVANCED .7 DNX12 (GAUZE/BANDAGES/DRESSINGS) IMPLANT
DRAIN HEMOVAC 1/8 X 5 (WOUND CARE) IMPLANT
DRAPE HALF SHEET 40X57 (DRAPES) IMPLANT
DRAPE X-RAY CASS 24X20 (DRAPES) ×1 IMPLANT
ELECT REM PT RETURN 9FT ADLT (ELECTROSURGICAL) ×2
ELECTRODE REM PT RTRN 9FT ADLT (ELECTROSURGICAL) ×1 IMPLANT
EVACUATOR SILICONE 100CC (DRAIN) IMPLANT
GAUZE SPONGE 4X4 16PLY XRAY LF (GAUZE/BANDAGES/DRESSINGS) ×1 IMPLANT
GLOVE BIO SURGEON STRL SZ 6.5 (GLOVE) ×5 IMPLANT
GLOVE BIO SURGEON STRL SZ7.5 (GLOVE) ×2 IMPLANT
GLOVE BIOGEL PI IND STRL 6.5 (GLOVE) IMPLANT
GLOVE BIOGEL PI INDICATOR 6.5 (GLOVE) ×6
GLOVE ECLIPSE 6.5 STRL STRAW (GLOVE) ×3 IMPLANT
GOWN STRL REUS W/ TWL LRG LVL3 (GOWN DISPOSABLE) ×3 IMPLANT
GOWN STRL REUS W/TWL LRG LVL3 (GOWN DISPOSABLE) ×14
GRAFT PROPATEN THIN WALL 6X80 (Vascular Products) ×1 IMPLANT
HEMOSTAT SPONGE AVITENE ULTRA (HEMOSTASIS) ×1 IMPLANT
KIT BASIN OR (CUSTOM PROCEDURE TRAY) ×2 IMPLANT
KIT TURNOVER KIT B (KITS) ×2 IMPLANT
LOOP VESSEL MAXI BLUE (MISCELLANEOUS) ×1 IMPLANT
LOOP VESSEL MINI RED (MISCELLANEOUS) ×2 IMPLANT
MARKER GRAFT CORONARY BYPASS (MISCELLANEOUS) ×1 IMPLANT
NS IRRIG 1000ML POUR BTL (IV SOLUTION) ×4 IMPLANT
PACK PERIPHERAL VASCULAR (CUSTOM PROCEDURE TRAY) ×2 IMPLANT
PAD ARMBOARD 7.5X6 YLW CONV (MISCELLANEOUS) ×4 IMPLANT
SET COLLECT BLD 21X3/4 12 (NEEDLE) ×1 IMPLANT
STOPCOCK 4 WAY LG BORE MALE ST (IV SETS) ×1 IMPLANT
SUT PROLENE 5 0 C 1 24 (SUTURE) ×2 IMPLANT
SUT PROLENE 6 0 CC (SUTURE) ×5 IMPLANT
SUT PROLENE 7 0 BV 1 (SUTURE) IMPLANT
SUT PROLENE 7 0 BV1 MDA (SUTURE) IMPLANT
SUT SILK 2 0 SH (SUTURE) ×2 IMPLANT
SUT SILK 3 0 (SUTURE) ×2
SUT SILK 3-0 18XBRD TIE 12 (SUTURE) IMPLANT
SUT VIC AB 2-0 SH 27 (SUTURE) ×4
SUT VIC AB 2-0 SH 27XBRD (SUTURE) ×2 IMPLANT
SUT VIC AB 3-0 SH 27 (SUTURE) ×8
SUT VIC AB 3-0 SH 27X BRD (SUTURE) ×4 IMPLANT
SUT VIC AB 4-0 PS2 27 (SUTURE) ×7 IMPLANT
TAPE UMBILICAL COTTON 1/8X30 (MISCELLANEOUS) ×2 IMPLANT
TOWEL GREEN STERILE (TOWEL DISPOSABLE) ×2 IMPLANT
TRAY FOLEY MTR SLVR 16FR STAT (SET/KITS/TRAYS/PACK) ×2 IMPLANT
TUBING EXTENTION W/L.L. (IV SETS) ×1 IMPLANT
UNDERPAD 30X30 (UNDERPADS AND DIAPERS) ×2 IMPLANT
WATER STERILE IRR 1000ML POUR (IV SOLUTION) ×2 IMPLANT

## 2019-12-07 NOTE — Evaluation (Signed)
Occupational Therapy Evaluation Patient Details Name: Marcus [name] MRN: [id number] DOB: 1950-11-02 Today's Date: 12/07/2019    History of Present Illness Pt is a 69 yo male s/p R femoral above the knee popliteal bypass graft. PMHx: CHF, C spine dx, depression, HTN, HLD, CVA in 2017.   Clinical Impression   Pt PTA: Pt living in single level home with family and reports independence with ADL and mobility occasionally with SPC. Pt currently performing ADL functional mobility with minguardA for transfers and sueprvisionA for mobility with RW. Pt minA overall for ADL as RLE sore and requires assist for LB ADL. Pt advised to use shower chair and BSC to increase independence. Pt reports good family support to assist with LB ADL at home. Pt does not require continued OT skilled services. OT signing off today.  O2 sats on RA>90% O2; HR 90 BPM; BP: 159/92, 69 BPM sitting EOB    Follow Up Recommendations  No OT follow up    Equipment Recommendations  None recommended by OT    Recommendations for Other Services       Precautions / Restrictions Precautions Precautions: Fall Restrictions Weight Bearing Restrictions: No      Mobility Bed Mobility Overal bed mobility: Needs Assistance Bed Mobility: Supine to Sit     Supine to sit: Supervision        Transfers Overall transfer level: Needs assistance Equipment used: Rolling walker (2 wheeled) Transfers: Sit to/from [employer] Sit to Stand: Min guard Stand pivot transfers: Min guard       General transfer comment: minguardA x2 from bed and from low commode; no physical assist to stand    Balance Overall balance assessment: No apparent balance deficits (not formally assessed)                                         ADL either performed or assessed with clinical judgement   ADL Overall ADL's : Needs assistance/impaired Eating/Feeding: Supervision/ safety   Grooming:  Supervision/safety;Standing   Upper Body Bathing: Supervision/ safety;Standing   Lower Body Bathing: Minimal assistance;Cueing for safety;Sitting/lateral leans;Sit to/from stand   Upper Body Dressing : Supervision/safety;Standing   Lower Body Dressing: Minimal assistance;Cueing for safety;Sitting/lateral leans;Sit to/from stand   Toilet Transfer: Min guard;Ambulation;RW   Toileting- Clothing Manipulation and Hygiene: Min guard;Sitting/lateral lean;Sit to/from stand       Functional mobility during ADLs: Supervision/safety;Rolling walker General ADL Comments: Pt requiring minA for LB ADL as groin area is very sore and pt unable to bend forward. pt advised to perform figure 4 technique when RLE is not so sore. Pt advised to have family assist as needed.     Vision Baseline Vision/History: No visual deficits Patient Visual Report: No change from baseline Vision Assessment?: No apparent visual deficits     Perception     Praxis      Pertinent Vitals/Pain       Hand Dominance Right   Extremity/Trunk Assessment Upper Extremity Assessment Upper Extremity Assessment: Overall WFL for tasks assessed   Lower Extremity Assessment Lower Extremity Assessment: Generalized weakness   Cervical / Trunk Assessment Cervical / Trunk Assessment: Normal   Communication Communication Communication: No difficulties   Cognition Arousal/Alertness: Awake/alert Behavior During Therapy: WFL for tasks assessed/performed Overall Cognitive Status: Within Functional Limits for tasks assessed  General Comments  Pt reports that he prefers to be independent and wants to return to PLOF. Pt reports that he has strong family support.    Exercises     Shoulder Instructions      Home Living Family/patient expects to be discharged to:: Private residence Living Arrangements: Children;Other (Comment)(graqndchildren) Available Help at Discharge:  Family;Available 24 hours/day Type of Home: House Home Access: Stairs to enter [employer] of Steps: 2   Home Layout: One level     Bathroom Shower/Tub: Occupational psychologist: Standard     Home Equipment: Cane - single point;Shower seat;Bedside commode          Prior Functioning/Environment Level of Independence: Independent with assistive device(s)        Comments: Drives, occasionally using SPC        OT Problem List: Pain;Decreased activity tolerance      OT Treatment/Interventions:      OT Goals(Current goals can be found in the care plan section) Acute Rehab OT Goals Patient Stated Goal: to go home OT Goal Formulation: With patient  OT Frequency:     Barriers to D/C:            Co-evaluation PT/OT/SLP Co-Evaluation/Treatment: Yes Reason for Co-Treatment: To address functional/ADL transfers   OT goals addressed during session: ADL's and self-care      AM-PAC OT "6 Clicks" Daily Activity     Outcome Measure Help from another person eating meals?: None Help from another person taking care of personal grooming?: None Help from another person toileting, which includes using toliet, bedpan, or urinal?: None Help from another person bathing (including washing, rinsing, drying)?: A Little Help from another person to put on and taking off regular upper body clothing?: None Help from another person to put on and taking off regular lower body clothing?: A Little 6 Click Score: 22   End of Session Equipment Utilized During Treatment: Gait belt;Rolling walker Nurse Communication: Mobility status  Activity Tolerance: Patient tolerated treatment well Patient left: Other (comment)(walking in hallway with PT)  OT Visit Diagnosis: Unsteadiness on feet (R26.81);Pain Pain - Right/Left: Right Pain - part of body: Leg                Time: 1438-1500 OT Time Calculation (min): 22 min Charges:  OT General Charges $OT Visit: 1 Visit OT  Evaluation $OT Eval Moderate Complexity: 1 Mod  Jefferey Pica, OTR/L Acute Rehabilitation Services Pager: [phone] Office: [phone]   Monroe Toure C 12/07/2019, 3:22 PM

## 2019-12-07 NOTE — Progress Notes (Signed)
  Progress Note    12/07/2019 4:35 PM Day of Surgery  Subjective:  Soreness at incisions   Vitals:   12/07/19 1300 12/07/19 1330  BP: (!) 157/76 (!) 151/97  Pulse: 62 61  Resp: 17 13  Temp:    SpO2: 99% 100%   Physical Exam: Lungs:  Non labored Incisions:  R groin and popliteal incision soft Extremities:  R peroneal and DP brisk by doppler Neurologic: A&O  CBC    Component Value Date/Time   WBC 5.0 12/07/2019 0743   RBC 5.38 12/07/2019 0743   HGB 14.3 12/07/2019 0743   HCT 47.1 12/07/2019 0743   PLT 197 12/07/2019 0743   MCV 87.5 12/07/2019 0743   MCH 26.6 12/07/2019 0743   MCHC 30.4 12/07/2019 0743   RDW 13.9 12/07/2019 0743   LYMPHSABS 2.0 08/03/2018 1015   MONOABS 0.7 08/03/2018 1015   EOSABS 0.0 08/03/2018 1015   BASOSABS 0.0 08/03/2018 1015    BMET    Component Value Date/Time   NA 141 12/07/2019 0743   K 4.3 12/07/2019 0743   CL 105 12/07/2019 0743   CO2 26 12/07/2019 0743   GLUCOSE 94 12/07/2019 0743   BUN 7 (L) 12/07/2019 0743   CREATININE 1.31 (H) 12/07/2019 0743   CALCIUM 9.1 12/07/2019 0743   GFRNONAA 55 (L) 12/07/2019 0743   GFRAA >60 12/07/2019 0743    INR    Component Value Date/Time   INR 0.9 12/07/2019 0743     Intake/Output Summary (Last 24 hours) at 12/07/2019 1635 Last data filed at 12/07/2019 1050 Gross per 24 hour  Intake 1450 ml  Output 460 ml  Net 990 ml     Assessment/Plan:  69 y.o. male is s/p R femoral to AK popliteal bypass with PTFE Day of Surgery   Well perfused R foot with peroneal and DP doppler signals Incisions are unremarkable Home when pain controlled and mobility improved; possibly tomorrow   Emilie Rutter, PA-C [facility] [phone] 12/07/2019 4:35 PM

## 2019-12-07 NOTE — Op Note (Signed)
Procedure: Right femoral to above-knee popliteal bypass with PTFE, intraoperative arteriogram  Preoperative diagnosis: Rest pain right foot  postoperative diagnosis: Same  Anesthesia: General  Operative findings: 1.  No adequate vein conduit for right leg bypass  2.  Coronary marker on proximal aspect of PTFE bypass  3.  Occlusion of the proximal tibial arteries with reconstitution of primarily peroneal artery  Operative details: After obtaining form consent, the patient was taken the operating.  The patient was placed in supine position operating table.  After induction general esthesia endotracheal ovation Foley catheter was placed.  Next a longitudinal incision was made right groin carried down through subcutaneous tissues down level right common femoral artery.  This was soft on palpation but did have some plaque along the anteromedial wall.  It was dissected free circumferentially and Vesseloops placed around it.  The profunda femoris and superficial femoral arteries were also dissected free circumferentially and Vesseloops placed around these.  Next in the medial portion the incision the saphenofemoral junction was exposed.  I dissected out the vein for the first 3 cm.  At this point the remainder of the vein had been previously harvested for coronary bypass grafting.  In light of the fact that we would not have adequate venous conduit to do a tibial bypass I decided to perform an intraoperative arteriogram the above-knee popliteal artery has a operation to try to bring him from rest pain back to claudication without a short durability PTFE graft to a diseased peroneal artery.  Therefore next a longitudinal incision was made on the medial aspect of the right leg above the knee.  Incision was carried on through subcutaneous tissues down the level sartorius muscle.  This was reflected posteriorly.  The above-knee popliteal space was entered.  Above-knee popliteal artery was dissected free  circumferentially.  It was slightly thickened.  It was fairly soft on palpation but became more calcified as you approach the knee.  Several geniculate branches were dissected free circumferentially and Vesseloops placed around this.  To make sure that we had an adequate popliteal artery throughout its course I performed intraoperative arteriogram introducing a 21-gauge butterfly needle into the above-knee popliteal artery.  This was done with inflow occlusion.  This showed wide patency of the popliteal artery with again proximal occlusion of the tibial vessels but good collateralization to these.  At this point a 6 mm propatent PTFE graft brought up in the operative field and tunneled in the subsartorial position.  Patient was given 8000 units of intravenous heparin.  Common femoral artery was controlled proximally distally.  Longitudinal opening was made on the anterolateral surface to avoid the plaque.  Graft was beveled and sewn endograft to side of artery using a running 5-0 Prolene suture.  Just prior to completion anastomosis it was for blood backbled and thoroughly flushed anastomosis was secured clamps released was pulsatile flow in the graft immediately.  Graft was cut to length for the above-knee popliteal artery.  This was also controlled proximally distally.  Longitudinal opening was made in the above-knee popliteal artery and the graft beveled and sewn end of graft to side of artery using a running 6-0 Prolene suture.  Just prior to completion anastomosis it was for blood backbled and thoroughly flushed reanastomosed was secured clamps released there is good flow in the above-knee popliteal artery immediately.  There is good Doppler flow in the above-knee popliteal artery as well as the peroneal at the ankle and the dorsalis pedis on the top of  the foot.  With clamping and unclamping of the graft flow augmented almost 100%.  At this point patient was given 80 mg of protamine.  Hemostasis was obtained  with Avitene.  Groin and above-knee popliteal incisions were closed with running [id number] Vicryl suture and Dermabond applied.  1 additional saphenectomy incision in the high medial thigh was closed with a running 3 oh followed by running 4-0 Vicryl suture.  Patient tolerated procedure well and there were no complications.  Initial sponge and needle counts correct in the case.  Patient was taken to recovery in stable condition.  Fabienne Bruns, MD [facility] [place] Office: [phone]

## 2019-12-07 NOTE — Anesthesia Procedure Notes (Signed)
Procedure Name: Intubation Date/Time: 12/07/2019 7:37 AM Performed by: Bryson Corona, CRNA Pre-anesthesia Checklist: Patient identified, Emergency Drugs available, Suction available and Patient being monitored Patient Re-evaluated:Patient Re-evaluated prior to induction Oxygen Delivery Method: Circle System Utilized Preoxygenation: Pre-oxygenation with 100% oxygen Induction Type: IV induction Ventilation: Mask ventilation without difficulty Laryngoscope Size: Mac and 4 Grade View: Grade I Tube type: Oral Tube size: 7.5 mm Number of attempts: 1 Airway Equipment and Method: Stylet and Oral airway Placement Confirmation: ETT inserted through vocal cords under direct vision,  positive ETCO2 and breath sounds checked- equal and bilateral Secured at: 23 cm Tube secured with: Tape Dental Injury: Teeth and Oropharynx as per pre-operative assessment  Comments: Intubation by Arvella Nigh

## 2019-12-07 NOTE — Evaluation (Addendum)
Physical Therapy Evaluation & Discharge Patient Details Name: Marcus [name] MRN: [id number] DOB: [birth year] Today's Date: 12/07/2019   History of Present Illness  Pt is a 69 yo male s/p R femoral above the knee popliteal bypass graft. PMHx: CHF, C spine dx, depression, HTN, HLD, CVA in 2017.    Clinical Impression  Patient evaluated by Physical Therapy with no further acute PT needs identified. PTA, pt independent and lives with supportive family. Today, pt progressing to mod indep with mobility; moving great. Educ re: importance of mobility, LE ROM/stretching, edema control. All education has been completed and the patient has no further questions. Encouraged continued hallway ambulation during hospital admission. Acute PT is signing off. Thank you for this referral.  Supine BP 157/87 Seated BP 159/92    Follow Up Recommendations No PT follow up    Equipment Recommendations  None recommended by PT    Recommendations for Other Services       Precautions / Restrictions Precautions Precautions: Fall Restrictions Weight Bearing Restrictions: No      Mobility  Bed Mobility Overal bed mobility: Modified Independent Bed Mobility: Supine to Sit     Supine to sit: Supervision        Transfers Overall transfer level: Needs assistance Equipment used: Rolling walker (2 wheeled);None Transfers: Sit to/from Stand Sit to Stand: Min guard;Modified independent (Device/Increase time) Stand pivot transfers: Min guard       General transfer comment: Reliant on momentum to power up from EOB with initial min guard to RW; progressing to mod indep for increased time without device  Ambulation/Gait Ambulation/Gait assistance: Supervision;Modified independent (Device/Increase time) Gait Distance (Feet): 500 Feet Assistive device: Rolling walker (2 wheeled);None Gait Pattern/deviations: Step-through pattern;Decreased stride length;Decreased weight shift to right;Antalgic   Gait  velocity interpretation: 1.31 - 2.62 ft/sec, indicative of limited community ambulator General Gait Details: Slow, antalgic gait initially with RW at supervision-level; progressed to hallway ambulation without device, mod indep for increased time  Stairs Stairs: (Pt declined stair training)          Wheelchair Mobility    Modified Rankin (Stroke Patients Only)       Balance Overall balance assessment: No apparent balance deficits (not formally assessed)                                           Pertinent Vitals/Pain Pain Assessment: Faces Faces Pain Scale: Hurts little more Pain Location: RLE Pain Descriptors / Indicators: Grimacing;Guarding Pain Intervention(s): Monitored during session    Home Living Family/patient expects to be discharged to:: Private residence Living Arrangements: Children;Other relatives Available Help at Discharge: Family;Available 24 hours/day Type of Home: House Home Access: Stairs to enter Entrance Stairs-Rails: None Entrance Stairs-Number of Steps: 2 Home Layout: One level Home Equipment: Cane - single point;Shower seat;Bedside commode      Prior Function Level of Independence: Independent with assistive device(s)         Comments: Drives, occasionally using SPC     Hand Dominance   Dominant Hand: Right    Extremity/Trunk Assessment   Upper Extremity Assessment Upper Extremity Assessment: Overall WFL for tasks assessed    Lower Extremity Assessment Lower Extremity Assessment: RLE deficits/detail RLE Deficits / Details: s/p revascularization sx; strength/ROM limited due to pain, still functionally at least 3/5    Cervical / Trunk Assessment Cervical / Trunk Assessment: Normal  Communication   Communication:  No difficulties  Cognition Arousal/Alertness: Awake/alert Behavior During Therapy: WFL for tasks assessed/performed Overall Cognitive Status: Within Functional Limits for tasks assessed                                         General Comments General comments (skin integrity, edema, etc.): Pt reports that he prefers to be independent and wants to return to PLOF. Pt reports that he has strong family support.    Exercises Other Exercises Other Exercises: educ on groin stretch, hamstring stretch, calf stretch   Assessment/Plan    PT Assessment Patent does not need any further PT services  PT Problem List         PT Treatment Interventions      PT Goals (Current goals can be found in the Care Plan section)  Acute Rehab PT Goals Patient Stated Goal: to go home PT Goal Formulation: All assessment and education complete, DC therapy    Frequency     Barriers to discharge        Co-evaluation   Reason for Co-Treatment: To address functional/ADL transfers   OT goals addressed during session: ADL's and self-care       AM-PAC PT "6 Clicks" Mobility  Outcome Measure Help needed turning from your back to your side while in a flat bed without using bedrails?: None Help needed moving from lying on your back to sitting on the side of a flat bed without using bedrails?: None Help needed moving to and from a bed to a chair (including a wheelchair)?: None Help needed standing up from a chair using your arms (e.g., wheelchair or bedside chair)?: None Help needed to walk in hospital room?: None Help needed climbing 3-5 steps with a railing? : None 6 Click Score: 24    End of Session Equipment Utilized During Treatment: Gait belt Activity Tolerance: Patient tolerated treatment well Patient left: in chair;with call bell/phone within reach Nurse Communication: Mobility status PT Visit Diagnosis: Other abnormalities of gait and mobility (R26.89)    Time: 1450-1511 PT Time Calculation (min) (ACUTE ONLY): 21 min   Charges:   PT Evaluation $PT Eval Low Complexity: 1 Low     Ina Homes, PT, DPT Acute Rehabilitation Services  Pager [phone] Office  [phone]  Malachy Chamber 12/07/2019, 4:47 PM

## 2019-12-07 NOTE — Progress Notes (Signed)
Pt transferred to 4E-09 via bed from PACU. Pt given CHG bath. Tele applied, CCMD notified. Pt oriented to call bell, bed and room. Call bell within reach. Right leg incisions c/d/i level 0. VSS. Will continue to monitor.  Theophilus Kinds, RN

## 2019-12-07 NOTE — Anesthesia Postprocedure Evaluation (Signed)
Anesthesia Post Note  Patient: AADEN BUCKMAN  Procedure(s) Performed: RIGHT FEMORAL-ABOVE KNEE POPLITEAL ARTERY BYPASS GRAFT USING PROPATEN GRAFT (Right Leg Upper) Intra Operative Arteriogram (Right Leg Upper)     Patient location during evaluation: PACU Anesthesia Type: General Level of consciousness: sedated Pain management: pain level controlled Vital Signs Assessment: post-procedure vital signs reviewed and stable Respiratory status: spontaneous breathing and respiratory function stable Cardiovascular status: stable Postop Assessment: no apparent nausea or vomiting Anesthetic complications: no    Last Vitals:  Vitals:   12/07/19 1156 12/07/19 1218  BP: 132/73 140/82  Pulse: 67 63  Resp: 17 17  Temp: 36.9 C 36.6 C  SpO2: 99% 99%    Last Pain:  Vitals:   12/07/19 1218  TempSrc: Oral  PainSc:                  Gevorg Brum,Liliana DANIEL

## 2019-12-07 NOTE — Interval H&P Note (Signed)
History and Physical Interval Note:  12/07/2019 7:16 AM  Marcus [name]  has presented today for surgery, with the diagnosis of PERIPHERAL ARTERY DISEASE.  The various methods of treatment have been discussed with the patient and family. After consideration of risks, benefits and other options for treatment, the patient has consented to  Procedure(s): BYPASS GRAFT FEMORAL-POPLITEAL ARTERY (Right) as a surgical intervention.  The patient's history has been reviewed, patient examined, no change in status, stable for surgery.  I have reviewed the patient's chart and labs.  Questions were answered to the patient's satisfaction.     Fabienne Bruns

## 2019-12-07 NOTE — Progress Notes (Signed)
Dr. Krista Blue made aware that patient arrived to short stay with elevated BP.  Patient states he has not taken his metoprolol in a few days.  Received orders for patient to receive home dose of 25mg  metoprolol.  Patient states that he can take acetaminophen ok without the associated allergy reaction stated on allergy list.  Per patient, Acetaminophen removed from allergy list.

## 2019-12-07 NOTE — Transfer of Care (Signed)
Immediate Anesthesia Transfer of Care Note  Patient: Marcus [name]  Procedure(s) Performed: RIGHT FEMORAL-ABOVE KNEE POPLITEAL ARTERY BYPASS GRAFT USING PROPATEN GRAFT (Right Leg Upper) Intra Operative Arteriogram (Right Leg Upper)  Patient Location: PACU  Anesthesia Type:General  Level of Consciousness: drowsy  Airway & Oxygen Therapy: Patient Spontanous Breathing and Patient connected to face mask oxygen  Post-op Assessment: Report given to RN and Post -op Vital signs reviewed and stable  Post vital signs: Reviewed and stable  Last Vitals:  Vitals Value Taken Time  BP 132/74 12/07/19 1126  Temp    Pulse 66 12/07/19 1127  Resp 19 12/07/19 1127  SpO2 100 % 12/07/19 1127  Vitals shown include unvalidated device data.  Last Pain:  Vitals:   12/07/19 0626  TempSrc:   PainSc: 6       Patients Stated Pain Goal: 3 (12/07/19 2493)  Complications: No apparent anesthesia complications

## 2019-12-08 ENCOUNTER — Encounter (HOSPITAL_COMMUNITY): Payer: Self-pay | Admitting: Vascular Surgery

## 2019-12-08 ENCOUNTER — Inpatient Hospital Stay (HOSPITAL_COMMUNITY): Payer: Medicare HMO

## 2019-12-08 DIAGNOSIS — R52 Pain, unspecified: Secondary | ICD-10-CM

## 2019-12-08 LAB — BASIC METABOLIC PANEL
Anion gap: 9 (ref 5–15)
BUN: 10 mg/dL (ref 8–23)
CO2: 24 mmol/L (ref 22–32)
Calcium: 8.6 mg/dL — ABNORMAL LOW (ref 8.9–10.3)
Chloride: 105 mmol/L (ref 98–111)
Creatinine, Ser: 1.35 mg/dL — ABNORMAL HIGH (ref 0.61–1.24)
GFR calc Af Amer: >60 mL/min (ref >60)
GFR calc non Af Amer: 53 mL/min — ABNORMAL LOW (ref >60)
Glucose, Bld: 120 mg/dL — ABNORMAL HIGH (ref 70–99)
Potassium: 4.2 mmol/L (ref 3.5–5.1)
Sodium: 138 mmol/L (ref 135–145)

## 2019-12-08 LAB — LIPID PANEL
Cholesterol: 144 mg/dL (ref 0–200)
HDL: 45 mg/dL (ref >40)
LDL Cholesterol: 76 mg/dL (ref 0–99)
Total CHOL/HDL Ratio: 3.2 RATIO
Triglycerides: 115 mg/dL (ref <150)
VLDL: 23 mg/dL (ref 0–40)

## 2019-12-08 LAB — CBC
HCT: 36.8 % — ABNORMAL LOW (ref 39.0–52.0)
Hemoglobin: 11.5 g/dL — ABNORMAL LOW (ref 13.0–17.0)
MCH: 26.8 pg (ref 26.0–34.0)
MCHC: 31.3 g/dL (ref 30.0–36.0)
MCV: 85.8 fL (ref 80.0–100.0)
Platelets: 169 10*3/uL (ref 150–400)
RBC: 4.29 MIL/uL (ref 4.22–5.81)
RDW: 13.9 % (ref 11.5–15.5)
WBC: 9.3 10*3/uL (ref 4.0–10.5)
nRBC: 0 % (ref 0.0–0.2)

## 2019-12-08 NOTE — Progress Notes (Addendum)
  Progress Note    12/08/2019 7:31 AM 1 Day Post-Op  Subjective:  Numbness in R foot which started overnight; denies rest pain.   Vitals:   12/08/19 0500 12/08/19 0600  BP:    Pulse:    Resp: 18 18  Temp:    SpO2:  96%   Physical Exam: Lungs:  Non labored Incisions:  R groin and popliteal incision c/d/i Extremities:  R peroneal brisk by doppler; ATA signal somewhat dampened compared to last night by doppler Abdomen:  Soft Neurologic: A&O  CBC    Component Value Date/Time   WBC 9.3 12/08/2019 0217   RBC 4.29 12/08/2019 0217   HGB 11.5 (L) 12/08/2019 0217   HCT 36.8 (L) 12/08/2019 0217   PLT 169 12/08/2019 0217   MCV 85.8 12/08/2019 0217   MCH 26.8 12/08/2019 0217   MCHC 31.3 12/08/2019 0217   RDW 13.9 12/08/2019 0217   LYMPHSABS 2.0 08/03/2018 1015   MONOABS 0.7 08/03/2018 1015   EOSABS 0.0 08/03/2018 1015   BASOSABS 0.0 08/03/2018 1015    BMET    Component Value Date/Time   NA 138 12/08/2019 0217   K 4.2 12/08/2019 0217   CL 105 12/08/2019 0217   CO2 24 12/08/2019 0217   GLUCOSE 120 (H) 12/08/2019 0217   BUN 10 12/08/2019 0217   CREATININE 1.35 (H) 12/08/2019 0217   CALCIUM 8.6 (L) 12/08/2019 0217   GFRNONAA 53 (L) 12/08/2019 0217   GFRAA >60 12/08/2019 0217    INR    Component Value Date/Time   INR 0.9 12/07/2019 0743     Intake/Output Summary (Last 24 hours) at 12/08/2019 0731 Last data filed at 12/08/2019 0047 Gross per 24 hour  Intake 1450 ml  Output 1710 ml  Net -260 ml     Assessment/Plan:  69 y.o. male is s/p R fem to AK pop with PTFE 1 Day Post-Op   Peroneal signal brisk by doppler Will add arterial duplex to ABIs today due to new R foot numbness overnight Dispo pending the above studies   Emilie Rutter, PA-C [facility] [phone] 12/08/2019 7:31 AM  Brisk biphasic peroneal almost biphasic DP fait PT doppler.  Incisions clean Agree with above. If above studies look reasonable pt would like to go home  around 10 am tomorrow  Fabienne Bruns, MD [facility] [place] Office: [phone]

## 2019-12-08 NOTE — Progress Notes (Signed)
ABI has been completed.   Preliminary results in CV Proc.   Blanch Media 12/08/2019 11:30 AM

## 2019-12-09 MED ORDER — PANTOPRAZOLE SODIUM 40 MG PO TBEC: 40.0000 mg | DELAYED_RELEASE_TABLET | Freq: Every day | ORAL | 11 refills | Status: DC

## 2019-12-09 MED ORDER — PANTOPRAZOLE SODIUM 40 MG PO TBEC: 40.0000 mg | DELAYED_RELEASE_TABLET | Freq: Every day | ORAL | 11 refills | Status: AC

## 2019-12-09 MED ORDER — ASPIRIN 81 MG PO TBEC: 81.0000 mg | DELAYED_RELEASE_TABLET | Freq: Every day | ORAL | 11 refills | Status: AC

## 2019-12-09 MED ORDER — ATORVASTATIN CALCIUM 40 MG PO TABS: 40.0000 mg | ORAL_TABLET | Freq: Every day | ORAL | 11 refills | Status: DC

## 2019-12-09 MED FILL — ASPIRIN LOW DOSE 81 MG TBEC: 81 | 30 days supply | Qty: 30 | Fill #0

## 2019-12-09 MED FILL — PANTOPRAZOLE SOD DR 40 MG T: 40 | 30 days supply | Qty: 30 | Fill #0

## 2019-12-09 MED FILL — ATORVASTATIN CALCIUM 40 MG: 40 | 30 days supply | Qty: 30 | Fill #0

## 2019-12-09 NOTE — Progress Notes (Signed)
Pharmacy note: discharge medications  Marcus [name] is on prilosec and plavix PTA and he is also noted on simvastatin 40mg  (last LDL was 76 on 11/28/19) -There is an drug interaction between plavix and prilosec (decreased plavix effectivenetss) -with PVD would also recommend a high intensity statin -the patient is agreeable to these changes  -Discussed with 11/30/19  Plan -Will modify discharge orders as follows 1) add protonix 40mg  po daily and atorvastatin 40mg  po daily and 2) stop taking prilosec and simvastatin -Discussed medication changes with patient  Doreatha Massed, PharmD Clinical Pharmacist **Pharmacist phone directory can now be found on [email or web address] ([email or web address]).  Listed under [facility].

## 2019-12-09 NOTE — Progress Notes (Addendum)
  Progress Note    12/09/2019 7:27 AM 2 Days Post-Op  Subjective:  Says his foot numbness is better and leg feels good.  Inquires about pain rx as he has pain management contract and he was due for refill 3/30.  Says he has been up walking  Afebrile HR 50's-70's NSR 90's-120's systolic 100% RA  Vitals:   12/09/19 0008 12/09/19 0428  BP: 102/62 (!) 96/56  Pulse: 72 65  Resp: 14 11  Temp: 97.6 F (36.4 C) 97.9 F (36.6 C)  SpO2: 93% 92%    Physical Exam: Cardiac:  regular Lungs:   Non labored Incisions:  Groin and lower leg incision look fine Extremities:  brisk peroneal and monophasic DP/PT. Foot is warm.   CBC    Component Value Date/Time   WBC 9.3 12/08/2019 0217   RBC 4.29 12/08/2019 0217   HGB 11.5 (L) 12/08/2019 0217   HCT 36.8 (L) 12/08/2019 0217   PLT 169 12/08/2019 0217   MCV 85.8 12/08/2019 0217   MCH 26.8 12/08/2019 0217   MCHC 31.3 12/08/2019 0217   RDW 13.9 12/08/2019 0217   LYMPHSABS 2.0 08/03/2018 1015   MONOABS 0.7 08/03/2018 1015   EOSABS 0.0 08/03/2018 1015   BASOSABS 0.0 08/03/2018 1015    BMET    Component Value Date/Time   NA 138 12/08/2019 0217   K 4.2 12/08/2019 0217   CL 105 12/08/2019 0217   CO2 24 12/08/2019 0217   GLUCOSE 120 (H) 12/08/2019 0217   BUN 10 12/08/2019 0217   CREATININE 1.35 (H) 12/08/2019 0217   CALCIUM 8.6 (L) 12/08/2019 0217   GFRNONAA 53 (L) 12/08/2019 0217   GFRAA >60 12/08/2019 0217    INR    Component Value Date/Time   INR 0.9 12/07/2019 0743     Intake/Output Summary (Last 24 hours) at 12/09/2019 0727 Last data filed at 12/09/2019 0009 Gross per 24 hour  Intake 600 ml  Output 1000 ml  Net -400 ml     Assessment:  69 y.o. male is s/p:  Right femoral to above-knee popliteal bypass with PTFE, intraoperative arteriogram  2 Days Post-Op  Plan: -pt with patent bypass with brisk peroneal and monophasic DP/PT.  His foot numbness is better.  ABI slightly improved from pre-op -pt has pain contract  and was due for refill on 3/30.  He and RN will try to contact their office about rx.  -case management for walker as he says cane will not suffice.  -DVT prophylaxis:  Sq heparin -discussed groin wound care and keeping groin dry when not showering and its importance.   Doreatha Massed, PA-C [facility] [phone] 12/09/2019 7:27 AM  Agree with above.  Rest pain resolved after femoropopliteal bypass.  Plan for discharge today after pain medication plan is in place.  He also needs a walker.  We will arrange for that.  Fabienne Bruns, MD [facility] [place] Office: [phone]

## 2019-12-09 NOTE — Discharge Instructions (Signed)
 [facility] [place]  Discharge instructions  Lower Extremity Bypass Surgery  Please refer to the following instruction for your post-procedure care. Your surgeon or physician assistant will discuss any changes with you.  Activity  You are encouraged to walk as much as you can. You can slowly return to normal activities during the month after your surgery. Avoid strenuous activity and heavy lifting until your doctor tells you it's OK. Avoid activities such as vacuuming or swinging a golf club. Do not drive until your doctor give the OK and you are no longer taking prescription pain medications. It is also normal to have difficulty with sleep habits, eating and bowel movement after surgery. These will go away with time.  Bathing/Showering  Shower daily after you go home. Do not soak in a bathtub, hot tub, or swim until the incision heals completely.  Incision Care  Clean your incision with mild soap and water. Shower every day. Pat the area dry with a clean towel. You do not need a bandage unless otherwise instructed. Do not apply any ointments or creams to your incision. If you have open wounds you will be instructed how to care for them or a visiting nurse may be arranged for you. If you have staples or sutures along your incision they will be removed at your post-op appointment. You may have skin glue on your incision. Do not peel it off. It will come off on its own in about one week.  Wash the groin wound with soap and water daily and pat dry. (No tub bath-only shower)  Then put a dry gauze or washcloth in the groin to keep this area dry to help prevent wound infection.  Do this daily and as needed.  Do not use Vaseline or neosporin on your incisions.  Only use soap and water on your incisions and then protect and keep dry.  Diet  Resume your normal diet. There are no special food restrictions following this procedure. A low fat/ low cholesterol diet is  recommended for all patients with vascular disease. In order to heal from your surgery, it is CRITICAL to get adequate nutrition. Your body requires vitamins, minerals, and protein. Vegetables are the best source of vitamins and minerals. Vegetables also provide the perfect balance of protein. Processed food has little nutritional value, so try to avoid this.  Medications  Resume taking all your medications unless your doctor or physician assistant tells you not to. If your incision is causing pain, you may take over-the-counter pain relievers such as acetaminophen (Tylenol). If you were prescribed a stronger pain medication, please aware these medication can cause nausea and constipation. Prevent nausea by taking the medication with a snack or meal. Avoid constipation by drinking plenty of fluids and eating foods with high amount of fiber, such as fruits, vegetables, and grains. Take Colace 100 mg (an over-the-counter stool softener) twice a day as needed for constipation.  Do not take Tylenol if you are taking prescription pain medications.  Follow Up  Our office will schedule a follow up appointment 2-3 weeks following discharge.  Please call us immediately for any of the following conditions  Severe or worsening pain in your legs or feet while at rest or while walking Increase pain, redness, warmth, or drainage (pus) from your incision site(s) Fever of 101 degree or higher The swelling in your leg with the bypass suddenly worsens and becomes more painful than when you were in the hospital If you have   been instructed to feel your graft pulse then you should do so every day. If you can no longer feel this pulse, call the office immediately. Not all patients are given this instruction.  Leg swelling is common after leg bypass surgery.  The swelling should improve over a few months following surgery. To improve the swelling, you may elevate your legs above the level of your heart while you are  sitting or resting. Your surgeon or physician assistant may ask you to apply an ACE wrap or wear compression (TED) stockings to help to reduce swelling.  Reduce your risk of vascular disease  Stop smoking. If you would like help call [facility] at 1-800-QUIT-NOW (1-800-784-8669) or [facility] at 336-586-4000.  Manage your cholesterol Maintain a desired weight Control your diabetes weight Control your diabetes Keep your blood pressure down  If you have any questions, please call the office at [phone]  

## 2019-12-09 NOTE — Discharge Summary (Addendum)
Discharge Summary     Marcus [name] 01/11/1951 69 y.o. male  [id number]  Admission Date: 12/07/2019  Discharge Date: 12/09/2019  Physician: Sherren Kerns, MD  Admission Diagnosis: PAD (peripheral artery disease) (HCC) [I73.9] Peripheral vascular disease (HCC) [I73.9]  HPI:   This is a 69 y.o. male a 1 to 2-week history of increasing pain in his right foot.  He states that this hurts continuously 24 hours a day.  He also has noted that his right calf becomes tight after walking less than a block.  He is a former smoker and quit about 10 years ago.  He is currently on Plavix aspirin and a statin.  He previously had an arteriogram performed by Dr. Myra Gianotti in January 2020.  This showed occlusion of the proximal tibial vessels with one-vessel runoff to the peroneal to the right foot.  At that time patient was having claudication symptoms and he was managed medically.  Of note he had similar findings in the left leg.  Other medical problems include multijoint arthritis, hyperlipidemia hypertension all of which are currently stable  Hospital Course:  The patient was admitted to the hospital and taken to the operating room on 12/07/2019 and underwent: Right femoral to above-knee popliteal bypass with PTFE, intraoperative arteriogram    Findings: 1.  No adequate vein conduit for right leg bypass 2.  Coronary marker on proximal aspect of PTFE bypass 3.  Occlusion of the proximal tibial arteries with reconstitution of primarily peroneal artery  The pt tolerated the procedure well and was transported to the PACU in good condition.   By POD 1, he was doing well but with c/o new right foot numbness overnight . ABI's were obtained and right was 0.6 and left was 0.84.  On POD 2, pt was doing well and right foot numbness was resolved.  He had brisk biphasic peroneal almost biphasic DP and faint PT doppler signals.  Incisions are clean.  He has a pain management contract and ws due a refill  2 days ago.  RN taking care of him today call the clinic and he has an appt this afternoon, therefore, pain medicine was not prescribed.  He has been started on a baby asa.  He also has an appt with his PCP on day of discharge at 1300.    Groin wound care discussed with pt.   The remainder of the hospital course consisted of increasing mobilization and increasing intake of solids without difficulty.  CBC    Component Value Date/Time   WBC 9.3 12/08/2019 0217   RBC 4.29 12/08/2019 0217   HGB 11.5 (L) 12/08/2019 0217   HCT 36.8 (L) 12/08/2019 0217   PLT 169 12/08/2019 0217   MCV 85.8 12/08/2019 0217   MCH 26.8 12/08/2019 0217   MCHC 31.3 12/08/2019 0217   RDW 13.9 12/08/2019 0217   LYMPHSABS 2.0 08/03/2018 1015   MONOABS 0.7 08/03/2018 1015   EOSABS 0.0 08/03/2018 1015   BASOSABS 0.0 08/03/2018 1015    BMET    Component Value Date/Time   NA 138 12/08/2019 0217   K 4.2 12/08/2019 0217   CL 105 12/08/2019 0217   CO2 24 12/08/2019 0217   GLUCOSE 120 (H) 12/08/2019 0217   BUN 10 12/08/2019 0217   CREATININE 1.35 (H) 12/08/2019 0217   CALCIUM 8.6 (L) 12/08/2019 0217   GFRNONAA 53 (L) 12/08/2019 0217   GFRAA >60 12/08/2019 0217     Discharge Instructions    Discharge patient   Complete  by: As directed    Discharge disposition: 01-Home or Self Care   Discharge patient date: 12/09/2019      Discharge Diagnosis:  PAD (peripheral artery disease) (HCC) [I73.9] Peripheral vascular disease (HCC) [I73.9]  Secondary Diagnosis: Patient Active Problem List   Diagnosis Date Noted  . PAD (peripheral artery disease) (HCC) 12/07/2019  . Peripheral vascular disease (HCC) 12/07/2019  . Chest pain 09/26/2017  . Cerebrovascular accident (CVA) (HCC)   . Acute combined systolic and diastolic CHF, NYHA class 3 (HCC) 07/16/2016  . Acute cerebral infarction (HCC) 07/16/2016  . Benign essential HTN 07/16/2016  . HLD (hyperlipidemia) 07/16/2016  . Non compliance w medication regimen  07/16/2016  . CKD (chronic kidney disease) stage 3, GFR 30-59 ml/min 07/16/2016  . CVA (cerebral vascular accident) (HCC) 07/16/2016  . Chest pain at rest 07/06/2014  . Hypertension 03/11/2011  . CAD (coronary artery disease) 03/11/2011   Past Medical History:  Diagnosis Date  . Arthritis   . Cervical spine disease   . CHF (congestive heart failure) (HCC)   . Depression   . GERD (gastroesophageal reflux disease)   . Headache    migraines in the "80's"  . Hyperlipidemia   . Hypertension   . Myocardial infarction (HCC)   . Peripheral vascular disease (HCC)   . Stroke (HCC)    x 2     Allergies as of 12/09/2019      Reactions   Penicillins Hives, Other (See Comments)   Has patient had a PCN reaction causing immediate rash, facial/tongue/throat swelling, SOB or lightheadedness with hypotension: No Has patient had a PCN reaction causing severe rash involving mucus membranes or skin necrosis: No Has patient had a PCN reaction that required hospitalization No Has patient had a PCN reaction occurring within the last 10 years: No If all of the above answers are "NO", then may proceed with Cephalosporin use.      Medication List    STOP taking these medications   omeprazole 20 MG capsule Commonly known as: PRILOSEC   simvastatin 40 MG tablet Commonly known as: ZOCOR     TAKE these medications   aspirin 81 MG EC tablet Take 1 tablet (81 mg total) by mouth daily at 6 (six) AM. Start taking on: December 10, 2019   atorvastatin 40 MG tablet Commonly known as: Lipitor Take 1 tablet (40 mg total) by mouth daily.   citalopram 10 MG tablet Commonly known as: CELEXA Take 10 mg by mouth 3 (three) times a week.   clonazePAM 0.5 MG tablet Commonly known as: KLONOPIN Take 0.5 mg by mouth 2 (two) times daily as needed for anxiety.   clopidogrel 75 MG tablet Commonly known as: PLAVIX Take 1 tablet (75 mg total) by mouth at bedtime.   lisinopril 10 MG tablet Commonly known as:  ZESTRIL Take 10 mg by mouth daily.   metoprolol tartrate 25 MG tablet Commonly known as: LOPRESSOR Take 25 mg by mouth 2 (two) times daily.   Muscle Rub 10-15 % Crea Apply 1 application topically as needed for muscle pain.   oxyCODONE-acetaminophen 7.5-325 MG tablet Commonly known as: PERCOCET Take 1 tablet by mouth 3 (three) times daily as needed for pain.   pantoprazole 40 MG tablet Commonly known as: PROTONIX Take 1 tablet (40 mg total) by mouth daily. Start taking on: December 10, 2019   pregabalin 75 MG capsule Commonly known as: LYRICA Take 75 mg by mouth 3 (three) times daily as needed (pain.).   rOPINIRole 1  MG tablet Commonly known as: REQUIP Take 1 mg by mouth at bedtime.   tamsulosin 0.4 MG Caps capsule Commonly known as: FLOMAX Take 0.4 mg by mouth daily.   traZODone 100 MG tablet Commonly known as: DESYREL Take 100 mg by mouth at bedtime.   Vitamin D-3 125 MCG (5000 UT) Tabs Take 5,000 Units by mouth in the morning and at bedtime.   zolpidem 10 MG tablet Commonly known as: AMBIEN Take 10 mg by mouth at bedtime as needed for sleep.            Durable Medical Equipment  (From admission, onward)         Start     Ordered   12/09/19 0745  For home use only DME Walker rolling  Once    Question Answer Comment  Walker: With 5 Inch Wheels   Patient needs a walker to treat with the following condition S/P femoral-popliteal bypass surgery      12/09/19 0744          Discharge Instructions: [facility] Discharge instructions Lower Extremity Bypass Surgery  Please refer to the following instruction for your post-procedure care. Your surgeon or physician assistant will discuss any changes with you.  Activity  You are encouraged to walk as much as you can. You can slowly return to normal activities during the month after your surgery. Avoid strenuous activity and heavy lifting until your doctor tells you it's OK. Avoid  activities such as vacuuming or swinging a golf club. Do not drive until your doctor give the OK and you are no longer taking prescription pain medications. It is also normal to have difficulty with sleep habits, eating and bowel movement after surgery. These will go away with time.  Bathing/Showering  You may shower after you go home. Do not soak in a bathtub, hot tub, or swim until the incision heals completely.  Incision Care  Clean your incision with mild soap and water. Shower every day. Pat the area dry with a clean towel. You do not need a bandage unless otherwise instructed. Do not apply any ointments or creams to your incision. If you have open wounds you will be instructed how to care for them or a visiting nurse may be arranged for you. If you have staples or sutures along your incision they will be removed at your post-op appointment. You may have skin glue on your incision. Do not peel it off. It will come off on its own in about one week.  Wash the groin wound with soap and water daily and pat dry. (No tub bath-only shower)  Then put a dry gauze or washcloth in the groin to keep this area dry to help prevent wound infection.  Do this daily and as needed.  Do not use Vaseline or neosporin on your incisions.  Only use soap and water on your incisions and then protect and keep dry.  Diet  Resume your normal diet. There are no special food restrictions following this procedure. A low fat/ low cholesterol diet is recommended for all patients with vascular disease. In order to heal from your surgery, it is CRITICAL to get adequate nutrition. Your body requires vitamins, minerals, and protein. Vegetables are the best source of vitamins and minerals. Vegetables also provide the perfect balance of protein. Processed food has little nutritional value, so try to avoid this.  Medications  Resume taking all your medications unless your doctor or Physician Assistant tells you  not to. If your  incision is causing pain, you may take over-the-counter pain relievers such as acetaminophen (Tylenol). If you were prescribed a stronger pain medication, please aware these medication can cause nausea and constipation. Prevent nausea by taking the medication with a snack or meal. Avoid constipation by drinking plenty of fluids and eating foods with high amount of fiber, such as fruits, vegetables, and grains. Take Colace 100 mg (an over-the-counter stool softener) twice a day as needed for constipation.  Do not take Tylenol if you are taking prescription pain medications.  Follow Up  Our office will schedule a follow up appointment 2-3 weeks following discharge.  Please call us immediately for any of the following conditions  .Severe or worsening pain in your legs or feet while at rest or while walking .Increase pain, redness, warmth, or drainage (pus) from your incision site(s) . Fever of 101 degree or higher . The swelling in your leg with the bypass suddenly worsens and becomes more painful than when you were in the hospital . If you have been instructed to feel your graft pulse then you should do so every day. If you can no longer feel this pulse, call the office immediately. Not all patients are given this instruction. .  Leg swelling is common after leg bypass surgery.  The swelling should improve over a few months following surgery. To improve the swelling, you may elevate your legs above the level of your heart while you are sitting or resting. Your surgeon or physician assistant may ask you to apply an ACE wrap or wear compression (TED) stockings to help to reduce swelling.  Reduce your risk of vascular disease  Stop smoking. If you would like help call [facility] at 1-800-QUIT-NOW (209-717-0994) or [facility] [address] at 984-416-7344.  . Manage your cholesterol . Maintain a desired weight . Control your diabetes weight . Control your diabetes . Keep your blood pressure down .  If you  have any questions, please call the office at [phone]   Prescriptions given: 1.  Aspirin 81mg  daily OTC   2.  Of note, , RN called pain clinic and pt will be seen there today and therefore no rx for pain meds given.   Disposition: home  Patient's condition: is Good  Follow up: 1. Dr. Dois Davenport in 2-3 weeks 2. PCP today at 1300 3. Pain management clinic at 1445.  Of note, Darrick Penna, RN called pain clinic and pt will be seen there today and therefore no rx for pain meds given.    Dois Davenport, PA-C [facility] [phone] 12/09/2019  12:11 PM  - For VQI Registry use ---   Post-op:  Wound infection: No  Graft infection: No  Transfusion: No    If yes, n/a units given New Arrhythmia: No Ipsilateral amputation: No, [ ]  Minor, [ ]  BKA, [ ]  AKA Discharge patency: [x ] Primary, [ ]  Primary assisted, [ ]  Secondary, [ ]  Occluded Patency judged by: [x ] Dopper only, [ ]  Palpable graft pulse, []  Palpable distal pulse, [ ]  ABI inc. > 0.15, [ ]  Duplex Discharge ABI: R not done, L  D/C Ambulatory Status: Ambulatory  Complications: MI: No, [ ]  Troponin only, [ ]  EKG or Clinical CHF: No Resp failure:No, [ ]  Pneumonia, [ ]  Ventilator Chg in renal function: No, [ ]  Inc. Cr > 0.5, [ ]  Temp. Dialysis,  [ ]  Permanent dialysis Stroke: No, [ ]  Minor, [ ]  Major Return to OR: No  Reason for  return to OR: [ ]  Bleeding, [ ]  Infection, [ ]  Thrombosis, [ ]  Revision  Discharge medications: Statin use:  yes ASA use:  yes Plavix use:  yes Beta blocker use: yes CCB use:  No ACEI use:   yes ARB use:  no Coumadin use: no

## 2019-12-09 NOTE — TOC Transition Note (Signed)
Transition of Care [facility]) - CM/SW Discharge Note Donn Pierini RN, BSN Transitions of Care Unit 4E- RN Case Manager [phone]    Patient Details  Name: CASSADY TURANO MRN: [id number] Date of Birth: [birth year]  Transition of Care [facility]) CM/SW Contact:  Darrold Span, RN Phone Number: 12/09/2019, 11:49 AM   Clinical Narrative:    Pt stable for transition home, order placed for DME-RW - call made to [facility] for DME needs, RW to be delivered to room prior to discharge.    Final next level of care: Home/Self Care Barriers to Discharge: No Barriers Identified           Discharge Placement                 home      Discharge Plan and Services                DME Arranged: Walker rolling DME Agency: [employer] Date DME Agency Contacted: 12/09/19 Time DME Agency Contacted: 1015 Representative spoke with at DME Agency: Ian Malkin HH Arranged: NA HH Agency: NA        Social Determinants of Health (SDOH) Interventions     Readmission Risk Interventions Readmission Risk Prevention Plan 12/09/2019  Transportation Screening Complete  PCP or Specialist Appt within 5-7 Days Complete  Home Care Screening Complete  Medication Review (RN CM) Complete  Some recent data might be hidden

## 2019-12-20 ENCOUNTER — Ambulatory Visit: Payer: Medicare HMO | Admitting: Podiatry

## 2019-12-21 ENCOUNTER — Encounter (HOSPITAL_COMMUNITY): Payer: Self-pay | Admitting: Emergency Medicine

## 2019-12-21 ENCOUNTER — Observation Stay (HOSPITAL_COMMUNITY): Payer: Medicare HMO

## 2019-12-21 ENCOUNTER — Observation Stay (HOSPITAL_COMMUNITY)
Admission: EM | Admit: 2019-12-21 | Discharge: 2019-12-22 | Disposition: A | Discharge status: Home / Self Care | Payer: Medicare HMO | Attending: Cardiovascular Disease | Admitting: Cardiovascular Disease

## 2019-12-21 DIAGNOSIS — R072 Precordial pain: Secondary | ICD-10-CM

## 2019-12-21 DIAGNOSIS — Z87891 Personal history of nicotine dependence: Secondary | ICD-10-CM | POA: Diagnosis not present

## 2019-12-21 DIAGNOSIS — I340 Nonrheumatic mitral (valve) insufficiency: Secondary | ICD-10-CM | POA: Insufficient documentation

## 2019-12-21 DIAGNOSIS — Z8249 Family history of ischemic heart disease and other diseases of the circulatory system: Secondary | ICD-10-CM | POA: Insufficient documentation

## 2019-12-21 DIAGNOSIS — Z951 Presence of aortocoronary bypass graft: Secondary | ICD-10-CM | POA: Diagnosis not present

## 2019-12-21 DIAGNOSIS — N183 Chronic kidney disease, stage 3 unspecified: Secondary | ICD-10-CM | POA: Insufficient documentation

## 2019-12-21 DIAGNOSIS — R0789 Other chest pain: Secondary | ICD-10-CM | POA: Diagnosis not present

## 2019-12-21 DIAGNOSIS — Z20822 Contact with and (suspected) exposure to covid-19: Secondary | ICD-10-CM | POA: Diagnosis not present

## 2019-12-21 DIAGNOSIS — Z955 Presence of coronary angioplasty implant and graft: Secondary | ICD-10-CM | POA: Diagnosis not present

## 2019-12-21 DIAGNOSIS — I700 Atherosclerosis of aorta: Secondary | ICD-10-CM | POA: Diagnosis not present

## 2019-12-21 DIAGNOSIS — Z7902 Long term (current) use of antithrombotics/antiplatelets: Secondary | ICD-10-CM | POA: Diagnosis not present

## 2019-12-21 DIAGNOSIS — I420 Dilated cardiomyopathy: Secondary | ICD-10-CM | POA: Insufficient documentation

## 2019-12-21 DIAGNOSIS — M1990 Unspecified osteoarthritis, unspecified site: Secondary | ICD-10-CM | POA: Diagnosis not present

## 2019-12-21 DIAGNOSIS — E785 Hyperlipidemia, unspecified: Secondary | ICD-10-CM | POA: Insufficient documentation

## 2019-12-21 DIAGNOSIS — I252 Old myocardial infarction: Secondary | ICD-10-CM | POA: Insufficient documentation

## 2019-12-21 DIAGNOSIS — F329 Major depressive disorder, single episode, unspecified: Secondary | ICD-10-CM | POA: Insufficient documentation

## 2019-12-21 DIAGNOSIS — I739 Peripheral vascular disease, unspecified: Secondary | ICD-10-CM | POA: Insufficient documentation

## 2019-12-21 DIAGNOSIS — I249 Acute ischemic heart disease, unspecified: Secondary | ICD-10-CM | POA: Diagnosis present

## 2019-12-21 DIAGNOSIS — Z79899 Other long term (current) drug therapy: Secondary | ICD-10-CM | POA: Insufficient documentation

## 2019-12-21 DIAGNOSIS — Z7982 Long term (current) use of aspirin: Secondary | ICD-10-CM | POA: Insufficient documentation

## 2019-12-21 DIAGNOSIS — I130 Hypertensive heart and chronic kidney disease with heart failure and stage 1 through stage 4 chronic kidney disease, or unspecified chronic kidney disease: Secondary | ICD-10-CM | POA: Insufficient documentation

## 2019-12-21 DIAGNOSIS — K219 Gastro-esophageal reflux disease without esophagitis: Secondary | ICD-10-CM | POA: Diagnosis not present

## 2019-12-21 DIAGNOSIS — I2510 Atherosclerotic heart disease of native coronary artery without angina pectoris: Secondary | ICD-10-CM | POA: Insufficient documentation

## 2019-12-21 DIAGNOSIS — Z8673 Personal history of transient ischemic attack (TIA), and cerebral infarction without residual deficits: Secondary | ICD-10-CM | POA: Insufficient documentation

## 2019-12-21 DIAGNOSIS — Z95828 Presence of other vascular implants and grafts: Secondary | ICD-10-CM | POA: Insufficient documentation

## 2019-12-21 DIAGNOSIS — Z880 Allergy status to penicillin: Secondary | ICD-10-CM | POA: Diagnosis not present

## 2019-12-21 DIAGNOSIS — I5042 Chronic combined systolic (congestive) and diastolic (congestive) heart failure: Secondary | ICD-10-CM | POA: Insufficient documentation

## 2019-12-21 DIAGNOSIS — Z841 Family history of disorders of kidney and ureter: Secondary | ICD-10-CM | POA: Insufficient documentation

## 2019-12-21 LAB — BASIC METABOLIC PANEL
Anion gap: 11 (ref 5–15)
BUN: 9 mg/dL (ref 8–23)
CO2: 23 mmol/L (ref 22–32)
Calcium: 9.2 mg/dL (ref 8.9–10.3)
Chloride: 105 mmol/L (ref 98–111)
Creatinine, Ser: 1.46 mg/dL — ABNORMAL HIGH (ref 0.61–1.24)
GFR calc Af Amer: 56 mL/min — ABNORMAL LOW (ref >60)
GFR calc non Af Amer: 48 mL/min — ABNORMAL LOW (ref >60)
Glucose, Bld: 91 mg/dL (ref 70–99)
Potassium: 4.2 mmol/L (ref 3.5–5.1)
Sodium: 139 mmol/L (ref 135–145)

## 2019-12-21 LAB — CBC
HCT: 42.2 % (ref 39.0–52.0)
Hemoglobin: 12.7 g/dL — ABNORMAL LOW (ref 13.0–17.0)
MCH: 26.7 pg (ref 26.0–34.0)
MCHC: 30.1 g/dL (ref 30.0–36.0)
MCV: 88.8 fL (ref 80.0–100.0)
Platelets: 250 10*3/uL (ref 150–400)
RBC: 4.75 MIL/uL (ref 4.22–5.81)
RDW: 14.3 % (ref 11.5–15.5)
WBC: 7.2 10*3/uL (ref 4.0–10.5)
nRBC: 0 % (ref 0.0–0.2)

## 2019-12-21 LAB — TROPONIN I (HIGH SENSITIVITY)
Troponin I (High Sensitivity): 12 ng/L (ref <18)
Troponin I (High Sensitivity): 10 ng/L (ref <18)

## 2019-12-21 MED ORDER — METOPROLOL TARTRATE 25 MG PO TABS
25.0000 mg | ORAL_TABLET | Freq: Two times a day (BID) | ORAL | Status: DC
  Administered 2019-12-21 – 2019-12-22 (×2): 25 mg via ORAL
  Filled 2019-12-21 (×2): qty 1

## 2019-12-21 MED ORDER — ASPIRIN EC 81 MG PO TBEC
81.0000 mg | DELAYED_RELEASE_TABLET | Freq: Every day | ORAL | Status: DC
  Administered 2019-12-22: 81 mg via ORAL
  Filled 2019-12-21: qty 1

## 2019-12-21 MED ORDER — NITROGLYCERIN 0.4 MG SL SUBL: 0.4000 mg | SUBLINGUAL_TABLET | SUBLINGUAL | Status: DC | PRN

## 2019-12-21 MED ORDER — OXYCODONE HCL 5 MG PO TABS
2.5000 mg | ORAL_TABLET | Freq: Three times a day (TID) | ORAL | Status: DC | PRN
  Administered 2019-12-22: 2.5 mg via ORAL
  Filled 2019-12-21: qty 1

## 2019-12-21 MED ORDER — LISINOPRIL 10 MG PO TABS
10.0000 mg | ORAL_TABLET | Freq: Every day | ORAL | Status: DC
  Administered 2019-12-22: 10 mg via ORAL
  Filled 2019-12-21: qty 1

## 2019-12-21 MED ORDER — ONDANSETRON HCL 4 MG/2ML IJ SOLN: 4.0000 mg | Freq: Four times a day (QID) | INTRAMUSCULAR | Status: DC | PRN

## 2019-12-21 MED ORDER — ATORVASTATIN CALCIUM 40 MG PO TABS
40.0000 mg | ORAL_TABLET | Freq: Every day | ORAL | Status: DC
  Administered 2019-12-22: 40 mg via ORAL
  Filled 2019-12-21: qty 1

## 2019-12-21 MED ORDER — ACETAMINOPHEN 325 MG PO TABS: 650.0000 mg | ORAL_TABLET | ORAL | Status: DC | PRN

## 2019-12-21 MED ORDER — TRAZODONE HCL 100 MG PO TABS
100.0000 mg | ORAL_TABLET | Freq: Every day | ORAL | Status: DC
  Administered 2019-12-21: 100 mg via ORAL
  Filled 2019-12-21: qty 1

## 2019-12-21 MED ORDER — CLOPIDOGREL BISULFATE 75 MG PO TABS
75.0000 mg | ORAL_TABLET | Freq: Every day | ORAL | Status: DC
  Administered 2019-12-21: 75 mg via ORAL
  Filled 2019-12-21: qty 1

## 2019-12-21 MED ORDER — HEPARIN BOLUS VIA INFUSION
4000.0000 [IU] | Freq: Once | INTRAVENOUS | Status: AC
  Administered 2019-12-21: 4000 [IU] via INTRAVENOUS
  Filled 2019-12-21: qty 4000

## 2019-12-21 MED ORDER — SODIUM CHLORIDE 0.9 % IV SOLN: INTRAVENOUS | Status: DC

## 2019-12-21 MED ORDER — TAMSULOSIN HCL 0.4 MG PO CAPS
0.4000 mg | ORAL_CAPSULE | Freq: Every day | ORAL | Status: DC
  Administered 2019-12-22: 0.4 mg via ORAL
  Filled 2019-12-21: qty 1

## 2019-12-21 MED ORDER — SODIUM CHLORIDE 0.9% FLUSH: 3.0000 mL | Freq: Once | INTRAVENOUS | Status: DC

## 2019-12-21 MED ORDER — OXYCODONE-ACETAMINOPHEN 5-325 MG PO TABS
1.0000 | ORAL_TABLET | Freq: Three times a day (TID) | ORAL | Status: DC | PRN
  Administered 2019-12-22 (×2): 1 via ORAL
  Filled 2019-12-21 (×2): qty 1

## 2019-12-21 MED ORDER — CLONAZEPAM 0.5 MG PO TABS: 0.5000 mg | ORAL_TABLET | Freq: Two times a day (BID) | ORAL | Status: DC | PRN

## 2019-12-21 MED ORDER — ISOSORBIDE MONONITRATE ER 30 MG PO TB24
15.0000 mg | ORAL_TABLET | Freq: Every day | ORAL | Status: DC
  Administered 2019-12-22: 15 mg via ORAL
  Filled 2019-12-21: qty 1

## 2019-12-21 MED ORDER — PANTOPRAZOLE SODIUM 40 MG PO TBEC
40.0000 mg | DELAYED_RELEASE_TABLET | Freq: Every day | ORAL | Status: DC
  Administered 2019-12-22: 40 mg via ORAL
  Filled 2019-12-21: qty 1

## 2019-12-21 MED ORDER — HEPARIN (PORCINE) 25000 UT/250ML-% IV SOLN
850.0000 [IU]/h | INTRAVENOUS | Status: DC
  Administered 2019-12-21: 1000 [IU]/h via INTRAVENOUS
  Filled 2019-12-21: qty 250

## 2019-12-21 MED ORDER — OXYCODONE-ACETAMINOPHEN 7.5-325 MG PO TABS: 1.0000 | ORAL_TABLET | Freq: Three times a day (TID) | ORAL | Status: DC | PRN

## 2019-12-21 MED ORDER — ROPINIROLE HCL 1 MG PO TABS
1.0000 mg | ORAL_TABLET | Freq: Every day | ORAL | Status: DC
  Administered 2019-12-21: 1 mg via ORAL
  Filled 2019-12-21: qty 1

## 2019-12-21 NOTE — Progress Notes (Signed)
ANTICOAGULATION CONSULT NOTE - Initial Consult  Pharmacy Consult for Heparin Indication: chest pain/ACS  Allergies  Allergen Reactions  . Penicillins Hives and Other (See Comments)    Has patient had a PCN reaction causing immediate rash, facial/tongue/throat swelling, SOB or lightheadedness with hypotension: No Has patient had a PCN reaction causing severe rash involving mucus membranes or skin necrosis: No Has patient had a PCN reaction that required hospitalization No Has patient had a PCN reaction occurring within the last 10 years: No If all of the above answers are "NO", then may proceed with Cephalosporin use.    Patient Measurements: Height: 5\' 7"  (170.2 cm) Weight: 77.1 kg (170 lb) IBW/kg (Calculated) : 66.1 Heparin Dosing Weight: 77.1 kg  Vital Signs: Temp: 98.5 F (36.9 C) (04/13 1755) Temp Source: Oral (04/13 1755) BP: 148/73 (04/13 1755) Pulse Rate: 77 (04/13 1755)  Labs: Recent Labs    12/21/19 1810  HGB 12.7*  HCT 42.2  PLT 250  CREATININE 1.46*  TROPONINIHS 12    Estimated Creatinine Clearance: 44.6 mL/min (A) (by C-G formula based on SCr of 1.46 mg/dL (H)).   Medical History: Past Medical History:  Diagnosis Date  . Arthritis   . Cervical spine disease   . CHF (congestive heart failure) (HCC)   . Depression   . GERD (gastroesophageal reflux disease)   . Headache    migraines in the "80's"  . Hyperlipidemia   . Hypertension   . Myocardial infarction (HCC)   . Peripheral vascular disease (HCC)   . Stroke [facility])    x 2     Assessment: 69 yo M presents with CP. Pharmacy asked to dose heparin. No AC noted PTA. Hgb 12.7, plts wnl, Scr 1.46.   Goal of Therapy:  Heparin level 0.3-0.7 units/ml Monitor platelets by anticoagulation protocol: Yes   Plan:  Give heparin 4000 unit IV bolus x1 Start heparin infusion at 1000 units/hr Check 6-hr HL Monitor daily HL, CBC, s/sx bleeding  78, PharmD PGY1 Pharmacy Resident Phone:  [phone] 12/21/2019  8:02 PM  Please check [email or web address] for unit-specific pharmacy phone numbers.

## 2019-12-21 NOTE — ED Provider Notes (Signed)
[facility] EMERGENCY DEPARTMENT Provider Note   CSN: [id number] Arrival date & time: 12/21/19  1738     History Chief Complaint  Patient presents with  . Chest Pain    Marcus [name] is a 69 y.o. male.  The history is provided by the patient and medical records. No language interpreter was used.   Marcus [name] is a 69 y.o. male who presents to the Emergency Department complaining of chest pain. He presents the emergency department complaining of left sided, sharp chest pain that began yesterday. Episodes are waxing and waning with no clear alleviating or worsening factors. There is no radiation. He denies any associated fevers, shortness of breath, diaphoresis, nausea, abdominal pain, leg swelling or pain. No prior similar symptoms. He contacted his cardiologist, who who recommended ED evaluation admission to evaluate his heart further.    Past Medical History:  Diagnosis Date  . Arthritis   . Cervical spine disease   . CHF (congestive heart failure) ([place])   . Depression   . GERD (gastroesophageal reflux disease)   . Headache    migraines in the "80's"  . Hyperlipidemia   . Hypertension   . Myocardial infarction ([place])   . Peripheral vascular disease ([place])   . Stroke [facility])    x 2    Patient Active Problem List   Diagnosis Date Noted  . Acute coronary syndrome ([place]) 12/21/2019  . PAD (peripheral artery disease) ([place]) 12/07/2019  . Peripheral vascular disease ([place]) 12/07/2019  . Chest pain 09/26/2017  . Cerebrovascular accident (CVA) ([place])   . Acute combined systolic and diastolic CHF, NYHA class 3 ([place]) 07/16/2016  . Acute cerebral infarction ([place]) 07/16/2016  . Benign essential HTN 07/16/2016  . HLD (hyperlipidemia) 07/16/2016  . Non compliance w medication regimen 07/16/2016  . CKD (chronic kidney disease) stage 3, GFR 30-59 ml/min 07/16/2016  . CVA (cerebral vascular accident) ([place]) 07/16/2016  . Chest pain at rest 07/06/2014  .  Hypertension 03/11/2011  . CAD (coronary artery disease) 03/11/2011    Past Surgical History:  Procedure Laterality Date  . ABDOMINAL AORTOGRAM W/LOWER EXTREMITY Bilateral 09/15/2018   Procedure: ABDOMINAL AORTOGRAM W/LOWER EXTREMITY;  Surgeon: Serafina Mitchell, MD;  Location: [place] CV LAB;  Service: Cardiovascular;  Laterality: Bilateral;  . ABDOMINAL AORTOGRAM W/LOWER EXTREMITY N/A 11/26/2019   Procedure: ABDOMINAL AORTOGRAM W/LOWER EXTREMITY;  Surgeon: Elam Dutch, MD;  Location: [place] CV LAB;  Service: Cardiovascular;  Laterality: N/A;  . breast lump removal    . BYPASS GRAFT  12/07/2019   RIGHT FEMORAL-ABOVE KNEE POPLITEAL ARTERY BYPASS GRAFT USING PROPATEN GRAFT (Right Leg Upper  . CORONARY ARTERY BYPASS GRAFT    . FEMORAL-POPLITEAL BYPASS GRAFT Right 12/07/2019   Procedure: RIGHT FEMORAL-ABOVE KNEE POPLITEAL ARTERY BYPASS GRAFT USING PROPATEN GRAFT;  Surgeon: Elam Dutch, MD;  Location: [place];  Service: Vascular;  Laterality: Right;  . INTRAOPERATIVE ARTERIOGRAM Right 12/07/2019   Procedure: Intra Operative Arteriogram;  Surgeon: Elam Dutch, MD;  Location: [place];  Service: Vascular;  Laterality: Right;  . LEFT HEART CATHETERIZATION WITH CORONARY/GRAFT ANGIOGRAM Right 05/01/2012   Procedure: LEFT HEART CATHETERIZATION WITH Beatrix Fetters;  Surgeon: Birdie Riddle, MD;  Location: [place] CATH LAB;  Service: Cardiovascular;  Laterality: Right;  . triple bypass         Family History  Problem Relation Age of Onset  . Kidney disease Mother   . Asthma Mother   . Hypertension Father  Social History   Tobacco Use  . Smoking status: Former Smoker    Years: 45.00  . Smokeless tobacco: Never Used  Substance Use Topics  . Alcohol use: Yes    Comment: occ  . Drug use: Yes    Types: Marijuana    Comment: last time 12/05/2019    Home Medications Prior to Admission medications   Medication Sig Start Date End Date Taking? Authorizing Provider    aspirin EC 81 MG EC tablet Take 1 tablet (81 mg total) by mouth daily at 6 (six) AM. 12/10/19  Yes Rhyne, Samantha J, PA-C  atorvastatin (LIPITOR) 40 MG tablet Take 1 tablet (40 mg total) by mouth daily. 12/09/19 12/08/20 Yes Fields, Janetta Hora, MD  Cholecalciferol (VITAMIN D-3) 125 MCG (5000 UT) TABS Take 5,000 Units by mouth in the morning and at bedtime.    Yes [provider]  citalopram (CELEXA) 10 MG tablet Take 10 mg by mouth in the morning and at bedtime.  08/14/17  Yes [provider]  clonazePAM (KLONOPIN) 0.5 MG tablet Take 0.5 mg by mouth 2 (two) times daily as needed for anxiety.  09/14/19  Yes [provider]  clopidogrel (PLAVIX) 75 MG tablet Take 1 tablet (75 mg total) by mouth at bedtime. 07/18/16  Yes Rai, Ripudeep K, MD  lisinopril (ZESTRIL) 10 MG tablet Take 10 mg by mouth daily. 09/06/19  Yes [provider]  Menthol-Methyl Salicylate (MUSCLE RUB) 10-15 % CREA Apply 1 application topically as needed for muscle pain.   Yes [provider]  metoprolol tartrate (LOPRESSOR) 25 MG tablet Take 25 mg by mouth 2 (two) times daily.   Yes [provider]  pantoprazole (PROTONIX) 40 MG tablet Take 1 tablet (40 mg total) by mouth daily. 12/10/19  Yes Fields, Janetta Hora, MD  pregabalin (LYRICA) 75 MG capsule Take 75 mg by mouth 3 (three) times daily as needed (pain.).  09/30/19  Yes [provider]  rOPINIRole (REQUIP) 1 MG tablet Take 1 mg by mouth at bedtime.  09/06/19  Yes [provider]  traZODone (DESYREL) 100 MG tablet Take 100 mg by mouth at bedtime. 10/29/19  Yes [provider]    Allergies    Other and Penicillins  Review of Systems   Review of Systems  All other systems reviewed and are negative.   Physical Exam Updated Vital Signs BP (!) 171/86   Pulse 76   Temp 98.5 F (36.9 C) (Oral)   Resp (!) 27   Ht 5\' 7"  (1.702 m)   Wt 77.1 kg   SpO2 98%   BMI 26.63 kg/m   Physical Exam Vitals and  nursing note reviewed.  Constitutional:      Appearance: He is well-developed.  HENT:     Head: Normocephalic and atraumatic.  Cardiovascular:     Rate and Rhythm: Normal rate and regular rhythm.     Heart sounds: No murmur.  Pulmonary:     Effort: Pulmonary effort is normal. No respiratory distress.     Breath sounds: Normal breath sounds.     Comments: Left sided chest wall tenderness Abdominal:     Palpations: Abdomen is soft.     Tenderness: There is no abdominal tenderness. There is no guarding or rebound.  Musculoskeletal:        General: No swelling or tenderness.     Comments: 2+ radial pulses bilaterally  Skin:    General: Skin is warm and dry.  Neurological:  Mental Status: He is alert and oriented to person, place, and time.  Psychiatric:        Behavior: Behavior normal.     ED Results / Procedures / Treatments   Labs (all labs ordered are listed, but only abnormal results are displayed) Labs Reviewed  BASIC METABOLIC PANEL - Abnormal; Notable for the following components:      Result Value   Creatinine, Ser 1.46 (*)    GFR calc non Af Amer 48 (*)    GFR calc Af Amer 56 (*)    All other components within normal limits  CBC - Abnormal; Notable for the following components:   Hemoglobin 12.7 (*)    All other components within normal limits  SARS CORONAVIRUS 2 (TAT 6-24 HRS)  BASIC METABOLIC PANEL  LIPID PANEL  PROTIME-INR  CBC  HEPARIN LEVEL (UNFRACTIONATED)  TROPONIN I (HIGH SENSITIVITY)  TROPONIN I (HIGH SENSITIVITY)    EKG EKG Interpretation  Date/Time:  Tuesday December 21 2019 20:44:27 EDT Ventricular Rate:  70 PR Interval:    QRS Duration: 115 QT Interval:  443 QTC Calculation: 478 R Axis:   49 Text Interpretation: Sinus rhythm Nonspecific intraventricular conduction delay Confirmed by Tilden Fossa [id number]) on 12/21/2019 11:15:37 PM   Radiology DG Chest 2 View  Result Date: 12/21/2019 CLINICAL DATA:  Intermittent chest pain since  yesterday EXAM: CHEST - 2 VIEW COMPARISON:  08/03/2018 FINDINGS: Frontal and lateral views of the chest demonstrates stable postsurgical changes from median sternotomy. Cardiac silhouette is unremarkable. No airspace disease, effusion, or pneumothorax. No acute bony abnormality. IMPRESSION: 1. Stable exam, no acute process. Electronically Signed   By: Sharlet Salina M.D.   On: 12/21/2019 19:55    Procedures Procedures (including critical care time)  Medications Ordered in ED Medications  sodium chloride flush (NS) 0.9 % injection 3 mL (3 mLs Intravenous Not Given 12/21/19 2154)  aspirin EC tablet 81 mg (has no administration in time range)  nitroGLYCERIN (NITROSTAT) SL tablet 0.4 mg (has no administration in time range)  acetaminophen (TYLENOL) tablet 650 mg (has no administration in time range)  ondansetron (ZOFRAN) injection 4 mg (has no administration in time range)  atorvastatin (LIPITOR) tablet 40 mg (has no administration in time range)  clonazePAM (KLONOPIN) tablet 0.5 mg (has no administration in time range)  clopidogrel (PLAVIX) tablet 75 mg (75 mg Oral Given 12/21/19 2156)  lisinopril (ZESTRIL) tablet 10 mg (has no administration in time range)  metoprolol tartrate (LOPRESSOR) tablet 25 mg (25 mg Oral Given 12/21/19 2156)  pantoprazole (PROTONIX) EC tablet 40 mg (has no administration in time range)  rOPINIRole (REQUIP) tablet 1 mg (1 mg Oral Given 12/21/19 2218)  tamsulosin (FLOMAX) capsule 0.4 mg (has no administration in time range)  traZODone (DESYREL) tablet 100 mg (100 mg Oral Given 12/21/19 2156)  isosorbide mononitrate (IMDUR) 24 hr tablet 15 mg (has no administration in time range)  0.9 %  sodium chloride infusion ( Intravenous New Bag/Given 12/21/19 2158)  heparin ADULT infusion 100 units/mL (25000 units/266mL sodium chloride 0.45%) (1,000 Units/hr Intravenous New Bag/Given 12/21/19 2101)  oxyCODONE-acetaminophen (PERCOCET/ROXICET) 5-325 MG per tablet 1 tablet (has no  administration in time range)    And  oxyCODONE (Oxy IR/ROXICODONE) immediate release tablet 2.5 mg (has no administration in time range)  heparin bolus via infusion 4,000 Units (4,000 Units Intravenous Bolus from Bag 12/21/19 2101)    ED Course  I have reviewed the triage vital signs and the nursing notes.  Pertinent labs & imaging  results that were available during my care of the patient were reviewed by me and considered in my medical decision making (see chart for details).    MDM Rules/Calculators/A&P                     Patient with history of hypertension, CKD, CVA, peripheral vascular disease here for evaluation of chest pain. EKG without acute ischemic changes. He has been evaluated by his cardiologist with plan to admit for further workup.   Presentation is not consistent with dissection, PE, pneumonia. Final Clinical Impression(s) / ED Diagnoses Final diagnoses:  None    Rx / DC Orders ED Discharge Orders    None       Tilden Fossa, MD 12/21/19 2316

## 2019-12-21 NOTE — ED Triage Notes (Signed)
Pt arrives to ER with c/o cp that started yesterday and has been "comign and going" currently has resolved. Pt has hx of cardia bypass surgery.

## 2019-12-21 NOTE — H&P (Signed)
Referring Physician:  [name] is an 69 y.o. male.                       Chief Complaint: Chest pain  HPI: 69 years old black male with CAD, CABG in 2011, PVD with recent Fem-POP bypass graft 12/07/2019 has recurrent non radiating retrosternal chest pain lasting for few minutes for 48 hours. He denies fever or cough. He denies sweating spell or nausea. He denies SL NTG use. His 1st HS-troponin I is normal at 12 ng. EKG shows diffuse inferior and lateral ischemic changes similar to prior EKG. Chest x-ray is pending.  Past Medical History:  Diagnosis Date  . Arthritis   . Cervical spine disease   . CHF (congestive heart failure) ([facility])   . Depression   . GERD (gastroesophageal reflux disease)   . Headache    migraines in the "80's"  . Hyperlipidemia   . Hypertension   . Myocardial infarction ([facility])   . Peripheral vascular disease ([facility])   . Stroke ([facility])    x 2      Past Surgical History:  Procedure Laterality Date  . ABDOMINAL AORTOGRAM W/LOWER EXTREMITY Bilateral 09/15/2018   Procedure: ABDOMINAL AORTOGRAM W/LOWER EXTREMITY;  Surgeon: Nada Libman, MD;  [facility];  Service: Cardiovascular;  Laterality: Bilateral;  . ABDOMINAL AORTOGRAM W/LOWER EXTREMITY N/A 11/26/2019   Procedure: ABDOMINAL AORTOGRAM W/LOWER EXTREMITY;  Surgeon: Sherren Kerns, MD;  Location: [facility];  Service: Cardiovascular;  Laterality: N/A;  . breast lump removal    . BYPASS GRAFT  12/07/2019   RIGHT FEMORAL-ABOVE KNEE POPLITEAL ARTERY BYPASS GRAFT USING PROPATEN GRAFT (Right Leg Upper  . CORONARY ARTERY BYPASS GRAFT    . FEMORAL-POPLITEAL BYPASS GRAFT Right 12/07/2019   Procedure: RIGHT FEMORAL-ABOVE KNEE POPLITEAL ARTERY BYPASS GRAFT USING PROPATEN GRAFT;  Surgeon: Sherren Kerns, MD;  Location: [facility] OR;  Service: Vascular;  Laterality: Right;  . INTRAOPERATIVE ARTERIOGRAM Right 12/07/2019   Procedure: Intra Operative Arteriogram;  Surgeon: Sherren Kerns, MD;   Location: [facility] OR;  Service: Vascular;  Laterality: Right;  . LEFT HEART CATHETERIZATION WITH CORONARY/GRAFT ANGIOGRAM Right 05/01/2012   Procedure: LEFT HEART CATHETERIZATION WITH Isabel Caprice;  Surgeon: Ricki Rodriguez, MD;  Location: [facility];  Service: Cardiovascular;  Laterality: Right;  . triple bypass      Family History  Problem Relation Age of Onset  . Kidney disease Mother   . Asthma Mother   . Hypertension Father    Social History:  reports that he has quit smoking. He quit after 45.00 years of use. He has never used smokeless tobacco. He reports current alcohol use. He reports current drug use. Drug: Marijuana.  Allergies:  Allergies  Allergen Reactions  . Penicillins Hives and Other (See Comments)    Has patient had a PCN reaction causing immediate rash, facial/tongue/throat swelling, SOB or lightheadedness with hypotension: No Has patient had a PCN reaction causing severe rash involving mucus membranes or skin necrosis: No Has patient had a PCN reaction that required hospitalization No Has patient had a PCN reaction occurring within the last 10 years: No If all of the above answers are "NO", then may proceed with Cephalosporin use.    (Not in a hospital admission)   Results for orders placed or performed during the hospital encounter of 12/21/19 (from the past 48 hour(s))  Basic metabolic panel     Status: Abnormal   Collection Time:  12/21/19  6:10 PM  Result Value Ref Range   Sodium 139 135 - 145 mmol/L   Potassium 4.2 3.5 - 5.1 mmol/L   Chloride 105 98 - 111 mmol/L   CO2 23 22 - 32 mmol/L   Glucose, Bld 91 70 - 99 mg/dL    Comment: Glucose reference range applies only to samples taken after fasting for at least 8 hours.   BUN 9 8 - 23 mg/dL   Creatinine, Ser 7.42 (H) 0.61 - 1.24 mg/dL   Calcium 9.2 8.9 - 59.5 mg/dL   GFR calc non Af Amer 48 (L) >60 mL/min   GFR calc Af Amer 56 (L) >60 mL/min   Anion gap 11 5 - 15    Comment: Performed at [facility], [address], [place], [place] [address]  CBC     Status: Abnormal   Collection Time: 12/21/19  6:10 PM  Result Value Ref Range   WBC 7.2 4.0 - 10.5 K/uL   RBC 4.75 4.22 - 5.81 MIL/uL   Hemoglobin 12.7 (L) 13.0 - 17.0 g/dL   HCT 64.3 32.9 - 51.8 %   MCV 88.8 80.0 - 100.0 fL   MCH 26.7 26.0 - 34.0 pg   MCHC 30.1 30.0 - 36.0 g/dL   RDW 84.1 66.0 - 63.0 %   Platelets 250 150 - 400 K/uL   nRBC 0.0 0.0 - 0.2 %    Comment: Performed at [facility], [address], [place], [place] [address]  Troponin I (High Sensitivity)     Status: None   Collection Time: 12/21/19  6:10 PM  Result Value Ref Range   Troponin I (High Sensitivity) 12 <18 ng/L    Comment: (NOTE) Elevated high sensitivity troponin I (hsTnI) values and significant  changes across serial measurements may suggest ACS but many other  chronic and acute conditions are known to elevate hsTnI results.  Refer to the "Links" section for chest pain algorithms and additional  guidance. Performed at [facility], [address], [place], [place] [address]    No results found.  Review Of Systems Constitutional: No fever, chills, weight loss or gain. Eyes: No vision change, wears glasses. No discharge or pain. Ears: No hearing loss, No tinnitus. Respiratory: No asthma, COPD, pneumonias. Positive shortness of breath. No hemoptysis. Cardiovascular: Positive chest pain, no palpitation, leg edema. Gastrointestinal: No nausea, vomiting, diarrhea, constipation. No GI bleed. No hepatitis. Genitourinary: No dysuria, hematuria, kidney stone. No incontinance. Neurological: No headache, stroke, seizures.  Psychiatry: No psych facility admission for anxiety, depression, suicide. No detox. Skin: No rash. Musculoskeletal: Positive joint pain, fibromyalgia, neck pain, back pain. Lymphadenopathy: No lymphadenopathy. Hematology: No anemia or easy bruising.   Blood pressure (!) 148/73, pulse 77, temperature 98.5 F  (36.9 C), temperature source Oral, resp. rate 15, height 5\' 7"  (1.702 m), weight 77.1 kg, SpO2 98 %. Body mass index is 26.63 kg/m. General appearance: alert, cooperative, appears stated age and no distress Head: Normocephalic, atraumatic. Eyes: Brown eyes, pink conjunctiva, corneas clear. PERRL, EOM's intact. Neck: No adenopathy, no carotid bruit, no JVD, supple, symmetrical, trachea midline and thyroid not enlarged. Resp: Clear to auscultation bilaterally. Cardio: Regular rate and rhythm, S1, S2 normal, II/VI systolic murmur, no click, rub or gallop GI: Soft, non-tender; bowel sounds normal; no organomegaly. Extremities: No edema, cyanosis or clubbing. Peripheral pulses weaker in right foot than left foot. Skin: Warm and dry.  Neurologic: Alert and oriented X 3, normal strength.  Normal coordination and slow gait.  Assessment/Plan Acute coronary syndrome CAD S/P LCX stent CABG PVD HTN S/P stroke Hyperlipidemia  Place in observation. IV heparin. Serial troponin I levels check. NM myocardial perfusion stress test in AM. Start small dose long acting nitrate and increase dose as tolerated.  Time spent: Review of old records, Lab, x-rays, EKG, other cardiac tests, examination, discussion with patient over 70 minutes.  Birdie Riddle, MD  12/21/2019, 7:29 PM

## 2019-12-22 ENCOUNTER — Observation Stay (HOSPITAL_COMMUNITY): Payer: Medicare HMO

## 2019-12-22 ENCOUNTER — Other Ambulatory Visit: Payer: Self-pay

## 2019-12-22 ENCOUNTER — Encounter (HOSPITAL_COMMUNITY): Payer: Self-pay | Admitting: Cardiovascular Disease

## 2019-12-22 DIAGNOSIS — R0789 Other chest pain: Secondary | ICD-10-CM | POA: Diagnosis not present

## 2019-12-22 LAB — HEPARIN LEVEL (UNFRACTIONATED): Heparin Unfractionated: 0.87 IU/mL — ABNORMAL HIGH (ref 0.30–0.70)

## 2019-12-22 LAB — LIPID PANEL
Cholesterol: 157 mg/dL (ref 0–200)
HDL: 47 mg/dL (ref >40)
LDL Cholesterol: 82 mg/dL (ref 0–99)
Total CHOL/HDL Ratio: 3.3 RATIO
Triglycerides: 141 mg/dL (ref <150)
VLDL: 28 mg/dL (ref 0–40)

## 2019-12-22 LAB — CBC
HCT: 36.8 % — ABNORMAL LOW (ref 39.0–52.0)
Hemoglobin: 11.4 g/dL — ABNORMAL LOW (ref 13.0–17.0)
MCH: 26.9 pg (ref 26.0–34.0)
MCHC: 31 g/dL (ref 30.0–36.0)
MCV: 86.8 fL (ref 80.0–100.0)
Platelets: 211 10*3/uL (ref 150–400)
RBC: 4.24 MIL/uL (ref 4.22–5.81)
RDW: 14.3 % (ref 11.5–15.5)
WBC: 6.5 10*3/uL (ref 4.0–10.5)
nRBC: 0 % (ref 0.0–0.2)

## 2019-12-22 LAB — SARS CORONAVIRUS 2 (TAT 6-24 HRS): SARS Coronavirus 2: NEGATIVE

## 2019-12-22 LAB — BASIC METABOLIC PANEL
Anion gap: 7 (ref 5–15)
BUN: 9 mg/dL (ref 8–23)
CO2: 26 mmol/L (ref 22–32)
Calcium: 8.8 mg/dL — ABNORMAL LOW (ref 8.9–10.3)
Chloride: 108 mmol/L (ref 98–111)
Creatinine, Ser: 1.47 mg/dL — ABNORMAL HIGH (ref 0.61–1.24)
GFR calc Af Amer: 56 mL/min — ABNORMAL LOW (ref >60)
GFR calc non Af Amer: 48 mL/min — ABNORMAL LOW (ref >60)
Glucose, Bld: 100 mg/dL — ABNORMAL HIGH (ref 70–99)
Potassium: 4.2 mmol/L (ref 3.5–5.1)
Sodium: 141 mmol/L (ref 135–145)

## 2019-12-22 LAB — PROTIME-INR
INR: 1 (ref 0.8–1.2)
Prothrombin Time: 12.9 seconds (ref 11.4–15.2)

## 2019-12-22 LAB — ECHOCARDIOGRAM COMPLETE
Height: 67 in
Weight: 2825.42 oz

## 2019-12-22 MED ORDER — VITAMIN D-3 125 MCG (5000 UT) PO TABS: 5000.0000 [IU] | ORAL_TABLET | Freq: Every day | ORAL | Status: DC

## 2019-12-22 MED ORDER — REGADENOSON 0.4 MG/5ML IV SOLN
0.4000 mg | Freq: Once | INTRAVENOUS | Status: AC
  Administered 2019-12-22: 0.4 mg via INTRAVENOUS
  Filled 2019-12-22: qty 5

## 2019-12-22 MED ORDER — NITROGLYCERIN 0.4 MG/SPRAY TL SOLN: 1.0000 | 1 refills | Status: AC | PRN

## 2019-12-22 MED ORDER — TECHNETIUM TC 99M TETROFOSMIN IV KIT
10.0000 | PACK | Freq: Once | INTRAVENOUS | Status: AC | PRN
  Administered 2019-12-22: 10 via INTRAVENOUS

## 2019-12-22 MED ORDER — NITROGLYCERIN 0.4 MG/SPRAY TL SOLN
1.0000 | Status: DC | PRN
  Filled 2019-12-22: qty 12

## 2019-12-22 MED ORDER — ISOSORBIDE MONONITRATE ER 30 MG PO TB24: 15.0000 mg | ORAL_TABLET | Freq: Every day | ORAL | 2 refills | Status: AC

## 2019-12-22 MED ORDER — REGADENOSON 0.4 MG/5ML IV SOLN
INTRAVENOUS | Status: AC
  Filled 2019-12-22: qty 5

## 2019-12-22 NOTE — ED Notes (Signed)
Pt requesting pain medication for leg pain, pt states he is in pain management, pt given PRN medication.

## 2019-12-22 NOTE — TOC Initial Note (Addendum)
Transition of Care [facility]) - Initial/Assessment Note    Patient Details  Name: Marcus [name] MRN: [id number] Date of Birth: [birth year]  [facility]) CM/SW Contact:    Marilu Favre, RN Phone Number: 12/22/2019, 12:36 PM  Clinical Narrative:                 Patient confirmed face sheet information. Patient from home with daughter. Has walker and shower chair , but not using at present.   Patient has PCP and transportation to appointments.   Expected Discharge Plan: Home/Self Care Barriers to Discharge: Continued Medical Work up   Patient Goals and CMS Choice Patient states their goals for this hospitalization and ongoing recovery are:: to return to home CMS Medicare.gov Compare Post Acute Care list provided to:: Patient Choice offered to / list presented to : NA  Expected Discharge Plan and Services Expected Discharge Plan: Home/Self Care   Discharge Planning Services: CM Consult Post Acute Care Choice: NA Living arrangements for the past 2 months: Single Family Home                 DME Arranged: N/A DME Agency: NA       HH Arranged: NA          Prior Living Arrangements/Services Living arrangements for the past 2 months: Single Family Home Lives with:: Adult Children Patient language and need for interpreter reviewed:: Yes Do you feel safe going back to the place where you live?: Yes      Need for Family Participation in Patient Care: Yes (Comment) Care giver support system in place?: Yes (comment)   Criminal Activity/Legal Involvement Pertinent to Current Situation/Hospitalization: No - Comment as needed  Activities of Daily Living Home Assistive Devices/Equipment: None ADL Screening (condition at time of admission) Patient's cognitive ability adequate to safely complete daily activities?: Yes Is the patient deaf or have difficulty hearing?: No Does the patient have difficulty seeing, even when wearing glasses/contacts?: No Does the patient  have difficulty concentrating, remembering, or making decisions?: No Patient able to express need for assistance with ADLs?: Yes Does the patient have difficulty dressing or bathing?: No Independently performs ADLs?: Yes (appropriate for developmental age) Does the patient have difficulty walking or climbing stairs?: No Weakness of Legs: None Weakness of Arms/Hands: None  Permission Sought/Granted   Permission granted to share information with : No              Emotional Assessment Appearance:: Appears stated age Attitude/Demeanor/Rapport: Engaged Affect (typically observed): Accepting Orientation: : Oriented to Place, Oriented to Self, Oriented to  Time, Oriented to Situation Alcohol / Substance Use: Not Applicable Psych Involvement: No (comment)  Admission diagnosis:  Precordial pain [R07.2] Acute coronary syndrome [facility]) [I24.9] Patient Active Problem List   Diagnosis Date Noted  . Acute coronary syndrome ([place]) 12/21/2019  . PAD (peripheral artery disease) ([place]) 12/07/2019  . Peripheral vascular disease ([place]) 12/07/2019  . Chest pain 09/26/2017  . Cerebrovascular accident (CVA) ([place])   . Acute combined systolic and diastolic CHF, NYHA class 3 ([place]) 07/16/2016  . Acute cerebral infarction ([place]) 07/16/2016  . Benign essential HTN 07/16/2016  . HLD (hyperlipidemia) 07/16/2016  . Non compliance w medication regimen 07/16/2016  . CKD (chronic kidney disease) stage 3, GFR 30-59 ml/min 07/16/2016  . CVA (cerebral vascular accident) ([place]) 07/16/2016  . Chest pain at rest 07/06/2014  . Hypertension 03/11/2011  . CAD (coronary artery disease) 03/11/2011   PCP:  Nolene Ebbs, MD Pharmacy:  [id number] [place], [place] - [address] [place] [address] Phone: [phone] Fax: [phone]     Social Determinants of Health (SDOH) Interventions    Readmission Risk Interventions Readmission  Risk Prevention Plan 12/09/2019  Transportation Screening Complete  PCP or Specialist Appt within 5-7 Days Complete  Home Care Screening Complete  Medication Review (RN CM) Complete  Some recent data might be hidden

## 2019-12-22 NOTE — Progress Notes (Signed)
ANTICOAGULATION CONSULT NOTE  Pharmacy Consult for Heparin Indication: chest pain/ACS  Allergies  Allergen Reactions  . Other Other (See Comments)    Tomato- hives  . Penicillins Hives and Other (See Comments)    Has patient had a PCN reaction causing immediate rash, facial/tongue/throat swelling, SOB or lightheadedness with hypotension: No Has patient had a PCN reaction causing severe rash involving mucus membranes or skin necrosis: No Has patient had a PCN reaction that required hospitalization No Has patient had a PCN reaction occurring within the last 10 years: No If all of the above answers are "NO", then may proceed with Cephalosporin use.    Patient Measurements: Height: 5\' 7"  (170.2 cm) Weight: 80.1 kg (176 lb 9.4 oz) IBW/kg (Calculated) : 66.1 Heparin Dosing Weight: 77.1 kg  Vital Signs: Temp: 97.8 F (36.6 C) (04/14 0358) Temp Source: Oral (04/14 0358) BP: 142/74 (04/14 0358) Pulse Rate: 62 (04/14 0358)  Labs: Recent Labs    12/21/19 1810 12/21/19 2113 12/22/19 0553  HGB 12.7*  --  11.4*  HCT 42.2  --  36.8*  PLT 250  --  211  HEPARINUNFRC  --   --  0.87*  CREATININE 1.46*  --   --   TROPONINIHS 12 10  --     Estimated Creatinine Clearance: 48.4 mL/min (A) (by C-G formula based on SCr of 1.46 mg/dL (H)).  Assessment: 69 y.o. male with chest pain for heparin   Goal of Therapy:  Heparin level 0.3-0.7 units/ml Monitor platelets by anticoagulation protocol: Yes   Plan:  Decrease Heparin 850 units/hr  78, PharmD, BCPS .

## 2019-12-22 NOTE — Discharge Summary (Signed)
Physician Discharge Summary  Patient ID: Marcus [name] MRN: [id number] DOB/AGE: December 18, 1950 69 y.o.  Admit date: 12/21/2019 Discharge date: 12/22/2019  Admission Diagnoses: Acute coronary syndrome CAD S/P LCX stent CABG PVD HTN S/P stroke Hyperlipidemia  Discharge Diagnoses:  Principle problem: Acute coronary syndrome [facility]) Active Problems:   Chest wall pain   CAD   CABG   S/P LCX stent   PVD   Recent Right Fem-Pop bypass   CKD, II   HTN   S/P stroke   Hyperlipidemia   Discharged Condition: good  Hospital Course: 69 years old black male with CAD, CABG in 2011, HTN, Hyperlipidemia, stroke had recent Fem-Pop bypass surgery. He presented to ER with recurrent chest pain x 2 days. His Troponin I levels were normal. He underwent nuclear stress test that showed fixed inferior wall defect and bo reversible ischemia. His LV function was normal by Echocardiogram with mild to moderate MR and mild TR. He has chest wall tenderness over prior left breast lump removal site.  He was given very small dose of isosorbide mononitrate and he tolerated it. He was also given NTG spray for as needed use. He will see me in 1 week and see his primary care in 1 month.  Consults: cardiology  Significant Diagnostic Studies: labs: Near normal CBC, Creatinine 1.47 mg. HS Troponin I levels were normal x 2. Lipid panel near normal with LDL cholesterol of 82 mg. And HDL of 47 mg.  EKG: NSR, Non-specific ST-T changes.  NM myocardial perfusion stress test: Fixed inferior wall perfusion defect and no reversible ischemia. EF 45 %.  Echocardiogram: Normal LV systolic function. Mild to moderate MR and mild TR.  Chest x-ray: Stable.  Treatments: cardiac meds: lisinopril (generic), metoprolol, aspirin, Clopidogrel and Atorvastatin.  Discharge Exam: Blood pressure 104/64, pulse 67, temperature 97.7 F (36.5 C), temperature source Oral, resp. rate 18, height 5\' 7"  (1.702 m), weight 80.1 kg, SpO2 97  %. General appearance: alert, cooperative and appears stated age. Head: Normocephalic, atraumatic. Eyes: Brown eyes, pink conjunctiva, corneas clear. PERRL, EOM's intact.  Neck: No adenopathy, no carotid bruit, no JVD, supple, symmetrical, trachea midline and thyroid not enlarged. Resp: Clear to auscultation bilaterally. Cardio: Regular rate and rhythm, S1, S2 normal, II/VI systolic murmur, no click, rub or gallop. GI: Soft, non-tender; bowel sounds normal; no organomegaly. Extremities: No edema, cyanosis or clubbing. Skin: Warm and dry.  Neurologic: Alert and oriented X 3, normal strength and tone. Normal coordination and gait.  Disposition: Discharge disposition: 01-Home or Self Care        Allergies as of 12/22/2019      Reactions   Other Other (See Comments)   Tomato- hives   Penicillins Hives, Other (See Comments)   Has patient had a PCN reaction causing immediate rash, facial/tongue/throat swelling, SOB or lightheadedness with hypotension: No Has patient had a PCN reaction causing severe rash involving mucus membranes or skin necrosis: No Has patient had a PCN reaction that required hospitalization No Has patient had a PCN reaction occurring within the last 10 years: No If all of the above answers are "NO", then may proceed with Cephalosporin use.      Medication List    TAKE these medications   aspirin 81 MG EC tablet Take 1 tablet (81 mg total) by mouth daily at 6 (six) AM.   atorvastatin 40 MG tablet Commonly known as: Lipitor Take 1 tablet (40 mg total) by mouth daily.   citalopram 10 MG tablet Commonly known as:  CELEXA Take 10 mg by mouth in the morning and at bedtime.   clonazePAM 0.5 MG tablet Commonly known as: KLONOPIN Take 0.5 mg by mouth 2 (two) times daily as needed for anxiety.   clopidogrel 75 MG tablet Commonly known as: PLAVIX Take 1 tablet (75 mg total) by mouth at bedtime.   isosorbide mononitrate 30 MG 24 hr tablet Commonly known as:  IMDUR Take 0.5 tablets (15 mg total) by mouth daily. Start taking on: December 23, 2019   lisinopril 10 MG tablet Commonly known as: ZESTRIL Take 10 mg by mouth daily.   metoprolol tartrate 25 MG tablet Commonly known as: LOPRESSOR Take 25 mg by mouth 2 (two) times daily.   Muscle Rub 10-15 % Crea Apply 1 application topically as needed for muscle pain.   nitroGLYCERIN 0.4 MG/SPRAY spray Commonly known as: NITROLINGUAL Place 1 spray under the tongue every 5 (five) minutes as needed for chest pain.   pantoprazole 40 MG tablet Commonly known as: PROTONIX Take 1 tablet (40 mg total) by mouth daily.   pregabalin 75 MG capsule Commonly known as: LYRICA Take 75 mg by mouth 3 (three) times daily as needed (pain.).   rOPINIRole 1 MG tablet Commonly known as: REQUIP Take 1 mg by mouth at bedtime.   traZODone 100 MG tablet Commonly known as: DESYREL Take 100 mg by mouth at bedtime.   Vitamin D-3 125 MCG (5000 UT) Tabs Take 5,000 Units by mouth daily. What changed: when to take this      Follow-up Information    Nolene Ebbs, MD. Schedule an appointment as soon as possible for a visit in 1 month(s).   Specialty: Internal Medicine Contact information: [place] [address] [phone]        Dixie Dials, MD. Schedule an appointment as soon as possible for a visit in 1 week(s).   Specialty: Cardiology Contact information: [place] [address] [phone]           Time spent: Review of old chart, current chart, lab, x-ray, cardiac tests and discussion with patient over 60 minutes.  Signed: Birdie Riddle 12/22/2019, 4:21 PM

## 2019-12-22 NOTE — Progress Notes (Signed)
  Echocardiogram 2D Echocardiogram has been performed.  Marcus [name] 12/22/2019, 12:17 PM

## 2019-12-22 NOTE — Progress Notes (Signed)
Pt given discharge summary and discharged via daughter as transportation.  

## 2019-12-30 ENCOUNTER — Other Ambulatory Visit: Payer: Self-pay

## 2019-12-30 ENCOUNTER — Encounter: Payer: Self-pay | Admitting: Vascular Surgery

## 2019-12-30 ENCOUNTER — Ambulatory Visit (INDEPENDENT_AMBULATORY_CARE_PROVIDER_SITE_OTHER): Payer: Self-pay | Admitting: Vascular Surgery

## 2019-12-30 VITALS — BP 159/80 | HR 55 | Temp 98.2°F | Resp 16 | Ht 67.0 in | Wt 173.0 lb

## 2019-12-30 DIAGNOSIS — I739 Peripheral vascular disease, unspecified: Secondary | ICD-10-CM

## 2019-12-30 NOTE — Progress Notes (Signed)
Patient is a 69 year old male who returns for postoperative follow-up today.  He underwent right femoral to above-knee popliteal bypass with PTFE approximately 23 days ago.  He has not had any incisional drainage.  He does not really have any pain in his right foot at this point.  However he states he has not really walked much.  Instead he states that he bought a scooter so he would not have to walk much.  I emphasized to him that the purpose for doing the operation was to make it easier for him to walk and that he needs to start walking at least 30 minutes daily.  Physical exam:  Vitals:   12/30/19 0810  BP: (!) 159/80  Pulse: (!) 55  Resp: 16  Temp: 98.2 F (36.8 C)  TempSrc: Temporal  SpO2: 98%  Weight: 173 lb (78.5 kg)  Height: 5\' 7"  (1.702 m)    Right lower extremity: Foot is warm both incisions are well-healed no drainage  Assessment: Patent above-knee popliteal bypass.  His bypass was to a blind segment above-knee popliteal artery and he has occlusion of the tibial origins with one-vessel peroneal runoff.  However, his rest pain seems to have resolved with the above-knee popliteal bypass.  Unfortunately he did not have adequate conduit to do a peroneal bypass.  If he continues to have significant symptoms he may need revision of this bypass and jump to the peroneal at some point in future.  Hopefully this will continue to alleviate his symptoms.  Plan: Patient is to walk 30 minutes every day.  He is to minimize the use of his scooter to maximize the benefits of the operation  He will follow-up in 2 months time for ABIs and a graft duplex scan.  , MD [facility] Office: [phone]

## 2020-01-03 ENCOUNTER — Ambulatory Visit (INDEPENDENT_AMBULATORY_CARE_PROVIDER_SITE_OTHER): Payer: Medicare HMO | Admitting: Podiatry

## 2020-01-03 ENCOUNTER — Other Ambulatory Visit: Payer: Self-pay

## 2020-01-03 VITALS — Temp 97.1°F

## 2020-01-03 DIAGNOSIS — M79675 Pain in left toe(s): Secondary | ICD-10-CM

## 2020-01-03 DIAGNOSIS — M79674 Pain in right toe(s): Secondary | ICD-10-CM | POA: Diagnosis not present

## 2020-01-03 DIAGNOSIS — L603 Nail dystrophy: Secondary | ICD-10-CM | POA: Diagnosis not present

## 2020-01-04 ENCOUNTER — Other Ambulatory Visit: Payer: Self-pay | Admitting: *Deleted

## 2020-01-04 DIAGNOSIS — I739 Peripheral vascular disease, unspecified: Secondary | ICD-10-CM

## 2020-01-04 DIAGNOSIS — I70211 Atherosclerosis of native arteries of extremities with intermittent claudication, right leg: Secondary | ICD-10-CM

## 2020-01-06 NOTE — Progress Notes (Signed)
   Subjective: 69 y.o. male presenting today with a chief complaint of shooting pain to the distal great toes bilaterally that began two months ago. He states it feels like he is stepping on rocks. He states the pain occurs suddenly and resolves quickly. He denies any known trauma or injury. He has not had any treatment of the symptoms. Patient is here for further evaluation and treatment.   Past Medical History:  Diagnosis Date  . Arthritis   . Cervical spine disease   . CHF (congestive heart failure) (HCC)   . Depression   . GERD (gastroesophageal reflux disease)   . Headache    migraines in the "80's"  . Hyperlipidemia   . Hypertension   . Myocardial infarction (HCC)   . Peripheral vascular disease (HCC)   . Stroke (HCC)    x 2    Objective: Physical Exam General: The patient is alert and oriented x3 in no acute distress.  Dermatology: Hyperkeratotic, discolored, thickened, onychodystrophy noted to the bilateral great toenails. Skin is warm, dry and supple bilateral lower extremities. Negative for open lesions or macerations.  Vascular: Palpable pedal pulses bilaterally. No edema or erythema noted. Capillary refill within normal limits.  Neurological: Epicritic and protective threshold grossly intact bilaterally.   Musculoskeletal Exam: Range of motion within normal limits to all pedal and ankle joints bilateral. Muscle strength 5/5 in all groups bilateral.   Assessment: #1 Dystrophic nails bilateral great toes   Plan of Care:  #1 Patient was evaluated. #2 Mechanical debridement of bilateral great toenails performed using a nail nipper. Filed with dremel without incident.  #3 Return to clinic as needed.    Felecia Shelling, DPM [facility]  Dr. Felecia Shelling, DPM    [address]                                        [place], [place] [address]                Office [phone]  Fax [phone]

## 2020-03-01 NOTE — Progress Notes (Signed)
POST OPERATIVE OFFICE NOTE    CC:  F/u for surgery  HPI:  This is a 69 y.o. male who is s/p right femoral to AK popliteal bypass with PTFE and intraoperative arteriogram on 12/07/2019 by Dr. Oneida Alar. Pt was seen back for his post operative visit on 12/30/2019 and at that time, he was not having any pain in his right foot, but had not walked much and had bought a scooter so that he wouldn't have to walk.  Dr. Oneida Alar instructed him to walk for 30 minutes per day and minimize use of his scooter.  He had him to follow up in 2 months with ABI's and he is here today for that appointment.   He states that his right leg is feeling really good. He is not having any claudication symptoms in the right leg. He does still have intermittent pains shooting into the right big toe but he also has same pain in his left foot. He has seen podiatry for this in April 2021 and they debrided some ingrown toe nails. He had temporary improvement following that but it has returned. He denies any rest pain or non healing wounds. He states however that his claudication symptoms on his left leg have worsened since he was here last. He says he is unable to walk a block without having to stop because of pain in left calf. With rest it will ease off but it comes immediately back. He has been trying to walk more since his last visit when he was instructed to ambulate 30 minutes daily. His increased left leg pain has been limiting this  He continues to take Aspirin, Plavix and statin daily   Allergies  Allergen Reactions  . Other Other (See Comments)    Tomato- hives  . Penicillins Hives and Other (See Comments)    Has patient had a PCN reaction causing immediate rash, facial/tongue/throat swelling, SOB or lightheadedness with hypotension: No Has patient had a PCN reaction causing severe rash involving mucus membranes or skin necrosis: No Has patient had a PCN reaction that required hospitalization No Has patient had a PCN reaction  occurring within the last 10 years: No If all of the above answers are "NO", then may proceed with Cephalosporin use.    Current Outpatient Medications  Medication Sig Dispense Refill  . aspirin EC 81 MG EC tablet Take 1 tablet (81 mg total) by mouth daily at 6 (six) AM. 30 tablet 11  . atorvastatin (LIPITOR) 40 MG tablet Take 1 tablet (40 mg total) by mouth daily. 30 tablet 11  . Cholecalciferol (VITAMIN D-3) 125 MCG (5000 UT) TABS Take 5,000 Units by mouth daily.    . citalopram (CELEXA) 10 MG tablet Take 10 mg by mouth in the morning and at bedtime.   2  . clonazePAM (KLONOPIN) 0.5 MG tablet Take 0.5 mg by mouth 2 (two) times daily as needed for anxiety.     . clopidogrel (PLAVIX) 75 MG tablet Take 1 tablet (75 mg total) by mouth at bedtime. 30 tablet 3  . HYDROcodone-acetaminophen (NORCO) 10-325 MG tablet     . isosorbide mononitrate (IMDUR) 30 MG 24 hr tablet Take 0.5 tablets (15 mg total) by mouth daily. 15 tablet 2  . lisinopril (ZESTRIL) 10 MG tablet Take 10 mg by mouth daily.    . Menthol-Methyl Salicylate (MUSCLE RUB) 10-15 % CREA Apply 1 application topically as needed for muscle pain.    . metoprolol tartrate (LOPRESSOR) 25 MG tablet Take 25 mg  by mouth 2 (two) times daily.    . montelukast (SINGULAIR) 10 MG tablet     . nitroGLYCERIN (NITROLINGUAL) 0.4 MG/SPRAY spray Place 1 spray under the tongue every 5 (five) minutes as needed for chest pain. 12 g 1  . pantoprazole (PROTONIX) 40 MG tablet Take 1 tablet (40 mg total) by mouth daily. 30 tablet 11  . pregabalin (LYRICA) 75 MG capsule Take 75 mg by mouth 3 (three) times daily as needed (pain.).     Marland Kitchen rOPINIRole (REQUIP) 1 MG tablet Take 1 mg by mouth at bedtime.     . traZODone (DESYREL) 100 MG tablet Take 100 mg by mouth at bedtime.     No current facility-administered medications for this visit.     ROS:  See HPI  Physical Exam: Vitals:   03/02/20 0856  BP: (!) 182/95  Pulse: 64  Resp: 20  Temp: (!) 97.5 F (36.4 C)   SpO2: 99%   General: well appearing, well nourished, not in any distress Cardiac: regular rate and rhythm, no carotid bruits Lungs: non labored, equal expansion bilaterally, without wheezing Extremities:  2 + femoral pulses bilaterally, bilateral lower extremities warm and well perfused. No palpable pulses. Doppler DP/ Pero right and DP/ Pero left Neuro: alert and oriented Abdomen:  Soft, non tender, non distended, normal bowel sounds  ABI/TBI 03/02/2020: Right:  0.80/0.44 (monophasic) Left: 0.94/0.35 (monophasic)  Arterial duplex 03/02/2020:  Right Graft #1: femoropopliteal (above-knee)  +------------------+--------+--------+--------+--------+           PSV cm/sStenosisWaveformComments  +------------------+--------+--------+--------+--------+  Inflow      76       biphasic      +------------------+--------+--------+--------+--------+  Prox Anastomosis 120       biphasic      +------------------+--------+--------+--------+--------+  Proximal Graft  129       biphasic      +------------------+--------+--------+--------+--------+  Mid Graft     48       biphasic      +------------------+--------+--------+--------+--------+  Distal Graft   69       biphasic      +------------------+--------+--------+--------+--------+  Distal Anastomosis106       biphasic      +------------------+--------+--------+--------+--------+  Outflow      159       biphasic      +------------------+--------+--------+--------+--------+   Patent femoropopliteal bypass graft of the right lower extremity. There is some diminished velocities in the mid graft. The patient does have noted poor runoff distal to the bypass graft in the right leg   Previous ABI/TBI  12/08/2019: Right:  0.60/0.60 (dampened M) Left:  0.84/0.40 (M)   Assessment/Plan:  This is a 69 y.o.  male who is s/p: right femoral to AK popliteal bypass with PTFE and intraoperative arteriogram on 12/07/2019 by Dr. Darrick Penna. He is doing well following bypass of the right lower extremity. Resolved claudication symptoms in the right leg. He has Doppler DP/ Peroneal signals in right foot. Non invasive studies show patent bypass graft and ABI's are stable. Slight decrease in right TBI. He is without rest pain or claudication in right leg. He however has worsening claudication symptoms in left leg over the past month. ABIs are stable with Doppler DP/Pero signals. - he has known occlusion of the proximal tibial vessels with single vessel runoff on the left similar to the right leg now with disabling claudication  -I have encouraged continued exercise therapy - he will continue medical management with Aspirin, Statin and Plavix - He presently does  not have rest pain and his non invasive studies today were essentially stable from prior -He will follow up in 4 weeks with Dr. Darrick Penna with left lower extremity arterial duplex   Nathanial Rancher, PA-C [facility] [phone]  Clinic MD:  Darrick Penna

## 2020-03-02 ENCOUNTER — Other Ambulatory Visit: Payer: Self-pay

## 2020-03-02 ENCOUNTER — Ambulatory Visit (INDEPENDENT_AMBULATORY_CARE_PROVIDER_SITE_OTHER): Payer: Self-pay | Admitting: Physician Assistant

## 2020-03-02 ENCOUNTER — Ambulatory Visit (HOSPITAL_COMMUNITY)
Admission: RE | Admit: 2020-03-02 | Discharge: 2020-03-02 | Disposition: A | Discharge status: Home / Self Care | Payer: Medicare HMO | Source: Ambulatory Visit | Attending: Vascular Surgery | Admitting: Vascular Surgery

## 2020-03-02 ENCOUNTER — Ambulatory Visit (INDEPENDENT_AMBULATORY_CARE_PROVIDER_SITE_OTHER)
Admission: RE | Admit: 2020-03-02 | Discharge: 2020-03-02 | Disposition: A | Discharge status: Home / Self Care | Payer: Medicare HMO | Source: Ambulatory Visit | Attending: Vascular Surgery | Admitting: Vascular Surgery

## 2020-03-02 ENCOUNTER — Encounter: Payer: Self-pay | Admitting: Physician Assistant

## 2020-03-02 VITALS — BP 182/95 | HR 64 | Temp 97.5°F | Resp 20 | Ht 67.0 in | Wt 184.3 lb

## 2020-03-02 DIAGNOSIS — I70211 Atherosclerosis of native arteries of extremities with intermittent claudication, right leg: Secondary | ICD-10-CM | POA: Diagnosis present

## 2020-03-02 DIAGNOSIS — I739 Peripheral vascular disease, unspecified: Secondary | ICD-10-CM | POA: Diagnosis present

## 2020-03-06 ENCOUNTER — Other Ambulatory Visit: Payer: Self-pay | Admitting: *Deleted

## 2020-03-06 DIAGNOSIS — I739 Peripheral vascular disease, unspecified: Secondary | ICD-10-CM

## 2020-03-22 ENCOUNTER — Ambulatory Visit (HOSPITAL_COMMUNITY)
Admission: RE | Admit: 2020-03-22 | Discharge: 2020-03-22 | Disposition: A | Discharge status: Home / Self Care | Payer: Medicare HMO | Source: Ambulatory Visit | Attending: Vascular Surgery | Admitting: Vascular Surgery

## 2020-03-22 ENCOUNTER — Other Ambulatory Visit: Payer: Self-pay

## 2020-03-22 DIAGNOSIS — I739 Peripheral vascular disease, unspecified: Secondary | ICD-10-CM | POA: Diagnosis present

## 2020-03-30 ENCOUNTER — Other Ambulatory Visit: Payer: Self-pay

## 2020-03-30 ENCOUNTER — Ambulatory Visit (INDEPENDENT_AMBULATORY_CARE_PROVIDER_SITE_OTHER): Payer: Medicare HMO | Admitting: Vascular Surgery

## 2020-03-30 ENCOUNTER — Encounter: Payer: Self-pay | Admitting: Vascular Surgery

## 2020-03-30 VITALS — BP 166/99 | HR 75 | Temp 97.9°F | Resp 20 | Ht 67.0 in | Wt 181.0 lb

## 2020-03-30 DIAGNOSIS — I739 Peripheral vascular disease, unspecified: Secondary | ICD-10-CM | POA: Diagnosis not present

## 2020-03-30 NOTE — Progress Notes (Signed)
Patient is a 69 year old male who returns for follow-up today.  He was seen in our APP clinic on March 02, 2020.  He has previously had a right femoral to above-knee popliteal bypass March 2021 for claudication.  This was done to a popliteal island.  He has one-vessel peroneal runoff on the right leg.  His claudication symptoms completely resolved in the right leg.  He now complains of claudication symptoms in the left leg occurring at about 1 block.  He does not have rest pain.  He has no open ulcers or wounds on the left foot.   Past Medical History:  Diagnosis Date  . Arthritis   . Cervical spine disease   . CHF (congestive heart failure) (HCC)   . Depression   . GERD (gastroesophageal reflux disease)   . Headache    migraines in the "80's"  . Hyperlipidemia   . Hypertension   . Myocardial infarction (HCC)   . Peripheral vascular disease (HCC)   . Stroke (HCC)    x 2    Past Surgical History:  Procedure Laterality Date  . ABDOMINAL AORTOGRAM W/LOWER EXTREMITY Bilateral 09/15/2018   Procedure: ABDOMINAL AORTOGRAM W/LOWER EXTREMITY;  Surgeon: Nada Libman, MD;  Location: [facility] INVASIVE [facility];  Service: Cardiovascular;  Laterality: Bilateral;  . ABDOMINAL AORTOGRAM W/LOWER EXTREMITY N/A 11/26/2019   Procedure: ABDOMINAL AORTOGRAM W/LOWER EXTREMITY;  Surgeon: Sherren Kerns, MD;  Location: [facility] INVASIVE CV [facility];  Service: Cardiovascular;  Laterality: N/A;  . breast lump removal    . BYPASS GRAFT  12/07/2019   RIGHT FEMORAL-ABOVE KNEE POPLITEAL ARTERY BYPASS GRAFT USING PROPATEN GRAFT (Right Leg Upper  . CORONARY ARTERY BYPASS GRAFT    . FEMORAL-POPLITEAL BYPASS GRAFT Right 12/07/2019   Procedure: RIGHT FEMORAL-ABOVE KNEE POPLITEAL ARTERY BYPASS GRAFT USING PROPATEN GRAFT;  Surgeon: Sherren Kerns, MD;  Location: [facility] OR;  Service: Vascular;  Laterality: Right;  . INTRAOPERATIVE ARTERIOGRAM Right 12/07/2019   Procedure: Intra Operative Arteriogram;  Surgeon: Sherren Kerns, MD;   Location: [facility] OR;  Service: Vascular;  Laterality: Right;  . LEFT HEART CATHETERIZATION WITH CORONARY/GRAFT ANGIOGRAM Right 05/01/2012   Procedure: LEFT HEART CATHETERIZATION WITH Isabel Caprice;  Surgeon: Ricki Rodriguez, MD;  Location: [facility] CATH LAB;  Service: Cardiovascular;  Laterality: Right;  . triple bypass      Social History   Socioeconomic History  . Marital status: Married    Spouse name: Not on file  . Number of children: Not on file  . Years of education: Not on file  . Highest education level: Not on file  Occupational History  . Not on file  Tobacco Use  . Smoking status: Former Smoker    Years: 45.00  . Smokeless tobacco: Never Used  Vaping Use  . Vaping Use: Never used  Substance and Sexual Activity  . Alcohol use: Yes    Comment: occ  . Drug use: Yes    Types: Marijuana    Comment: last time 12/05/2019  . Sexual activity: Not on file  Other Topics Concern  . Not on file  Social History Narrative  . Not on file   Social Determinants of Health   Financial Resource Strain:   . Difficulty of Paying Living Expenses:   Food Insecurity:   . Worried About Programme researcher, broadcasting/film/video in the Last Year:   . Barista in the Last Year:   Transportation Needs:   . Freight forwarder (Medical):   Marland Kitchen Lack of  Transportation (Non-Medical):   Physical Activity:   . Days of Exercise per Week:   . Minutes of Exercise per Session:   Stress:   . Feeling of Stress :   Social Connections:   . Frequency of Communication with Friends and Family:   . Frequency of Social Gatherings with Friends and Family:   . Attends Religious Services:   . Active Member of Clubs or Organizations:   . Attends Banker Meetings:   Marland Kitchen Marital Status:   Intimate Partner Violence:   . Fear of Current or Ex-Partner:   . Emotionally Abused:   Marland Kitchen Physically Abused:   . Sexually Abused:     Current Outpatient Medications on File Prior to Visit  Medication Sig Dispense  Refill  . aspirin EC 81 MG EC tablet Take 1 tablet (81 mg total) by mouth daily at 6 (six) AM. 30 tablet 11  . atorvastatin (LIPITOR) 40 MG tablet Take 1 tablet (40 mg total) by mouth daily. 30 tablet 11  . Cholecalciferol (VITAMIN D-3) 125 MCG (5000 UT) TABS Take 5,000 Units by mouth daily.    . citalopram (CELEXA) 10 MG tablet Take 10 mg by mouth in the morning and at bedtime.   2  . clonazePAM (KLONOPIN) 0.5 MG tablet Take 0.5 mg by mouth 2 (two) times daily as needed for anxiety.     . clopidogrel (PLAVIX) 75 MG tablet Take 1 tablet (75 mg total) by mouth at bedtime. 30 tablet 3  . HYDROcodone-acetaminophen (NORCO) 10-325 MG tablet     . isosorbide mononitrate (IMDUR) 30 MG 24 hr tablet Take 0.5 tablets (15 mg total) by mouth daily. 15 tablet 2  . lisinopril (ZESTRIL) 10 MG tablet Take 10 mg by mouth daily.    . Menthol-Methyl Salicylate (MUSCLE RUB) 10-15 % CREA Apply 1 application topically as needed for muscle pain.    . metoprolol tartrate (LOPRESSOR) 25 MG tablet Take 25 mg by mouth 2 (two) times daily.    . montelukast (SINGULAIR) 10 MG tablet     . nitroGLYCERIN (NITROLINGUAL) 0.4 MG/SPRAY spray Place 1 spray under the tongue every 5 (five) minutes as needed for chest pain. 12 g 1  . pantoprazole (PROTONIX) 40 MG tablet Take 1 tablet (40 mg total) by mouth daily. 30 tablet 11  . pregabalin (LYRICA) 75 MG capsule Take 75 mg by mouth 3 (three) times daily as needed (pain.).     Marland Kitchen rOPINIRole (REQUIP) 1 MG tablet Take 1 mg by mouth at bedtime.     . traZODone (DESYREL) 100 MG tablet Take 100 mg by mouth at bedtime.     No current facility-administered medications on file prior to visit.    Physical exam:  Vitals:   03/30/20 0819  BP: (!) 166/99  Pulse: 75  Resp: 20  Temp: 97.9 F (36.6 C)  SpO2: 97%  Weight: 181 lb (82.1 kg)  Height: 5\' 7"  (1.702 m)    Extremities: No palpable pedal pulses bilaterally feet are pink and warm  Data: I reviewed the patient's recent  arteriogram images.  In the left leg he has occlusion of the popliteal artery with reconstitution of one-vessel peroneal runoff.  The peroneal artery is very diseased and reconstitutes in its mid segment.  Recent ABIs right toe pressure was 88 left was 70.  Bypass graft on the right side was widely patent.  I reviewed and interpreted all the studies.  Assessment: Short distance claudication left leg but currently does not have  limb threatening ischemia.  I would not consider doing a tibial intervention for claudication symptoms alone and I discussed this with the patient today that that could put him in a limb threatening situation.  He will try to continue to walk for half an hour daily to improve his walking distance.  He is very satisfied with his right leg currently.  Plan: The patient will follow up in 3 months time with a graft duplex of his right leg and bilateral ABIs with toe pressures.  Would only consider revascularizing the left leg if he gets into a situation of a limb threatening situation as any intervention on the left leg may be of short durability.  Fabienne Bruns, MD [facility] Office: [phone]

## 2020-04-04 ENCOUNTER — Other Ambulatory Visit: Payer: Self-pay | Admitting: *Deleted

## 2020-04-04 DIAGNOSIS — I739 Peripheral vascular disease, unspecified: Secondary | ICD-10-CM

## 2020-04-06 ENCOUNTER — Telehealth: Payer: Self-pay

## 2020-04-06 NOTE — Telephone Encounter (Signed)
Pt called triage line. I have tried several times throughout day to return pt's call and rec'd no answer.

## 2020-04-07 ENCOUNTER — Telehealth: Payer: Self-pay

## 2020-04-07 ENCOUNTER — Ambulatory Visit: Payer: Medicare HMO

## 2020-04-07 ENCOUNTER — Telehealth: Payer: Self-pay | Admitting: *Deleted

## 2020-04-07 NOTE — Telephone Encounter (Signed)
Pt called to r/s his appt to Monday 8.2 due to childcare issues.

## 2020-04-07 NOTE — Telephone Encounter (Signed)
Patient left message with answering service. Returned call with no answer. Left message for him to call office and schedule an appointment.

## 2020-04-10 ENCOUNTER — Other Ambulatory Visit: Payer: Self-pay

## 2020-04-10 ENCOUNTER — Ambulatory Visit (INDEPENDENT_AMBULATORY_CARE_PROVIDER_SITE_OTHER): Payer: Medicare HMO | Admitting: Physician Assistant

## 2020-04-10 VITALS — BP 157/92 | HR 63 | Temp 97.8°F | Resp 20 | Ht 67.0 in | Wt 185.0 lb

## 2020-04-10 DIAGNOSIS — M79605 Pain in left leg: Secondary | ICD-10-CM | POA: Diagnosis not present

## 2020-04-10 NOTE — Progress Notes (Signed)
Office Note     CC:  follow up Requesting Provider:  Raymon Mutton., FNP  HPI: Marcus [name] is a 69 y.o. (November 07, 1950) male who presents with one week of burning right lower extremity discomfort.  He says it is not painful and no aggravating or alleviating factors.  He says it will start on the inside of his lower leg and sometimes move up to the right groin.  Lasts usually about 20 minutes.  Not associated with exercise. No rest pain.  Right femoral to AK popliteal bypass with PTFE in March, 2021. No surgical complications Continues asa, Plavix and statin.   Stays busy at home with grandchildren and not walking as a routine.   Past Medical History:  Diagnosis Date  . Arthritis   . Cervical spine disease   . CHF (congestive heart failure) (HCC)   . Depression   . GERD (gastroesophageal reflux disease)   . Headache    migraines in the "80's"  . Hyperlipidemia   . Hypertension   . Myocardial infarction (HCC)   . Peripheral vascular disease (HCC)   . Stroke (HCC)    x 2    Past Surgical History:  Procedure Laterality Date  . ABDOMINAL AORTOGRAM W/LOWER EXTREMITY Bilateral 09/15/2018   Procedure: ABDOMINAL AORTOGRAM W/LOWER EXTREMITY;  Surgeon: Nada Libman, MD;  Location: [facility];  Service: Cardiovascular;  Laterality: Bilateral;  . ABDOMINAL AORTOGRAM W/LOWER EXTREMITY N/A 11/26/2019   Procedure: ABDOMINAL AORTOGRAM W/LOWER EXTREMITY;  Surgeon: Sherren Kerns, MD;  Location: [facility];  Service: Cardiovascular;  Laterality: N/A;  . breast lump removal    . BYPASS GRAFT  12/07/2019   RIGHT FEMORAL-ABOVE KNEE POPLITEAL ARTERY BYPASS GRAFT USING PROPATEN GRAFT (Right Leg Upper  . CORONARY ARTERY BYPASS GRAFT    . FEMORAL-POPLITEAL BYPASS GRAFT Right 12/07/2019   Procedure: RIGHT FEMORAL-ABOVE KNEE POPLITEAL ARTERY BYPASS GRAFT USING PROPATEN GRAFT;  Surgeon: Sherren Kerns, MD;  Location: [facility] OR;  Service: Vascular;  Laterality: Right;  .  INTRAOPERATIVE ARTERIOGRAM Right 12/07/2019   Procedure: Intra Operative Arteriogram;  Surgeon: Sherren Kerns, MD;  Location: [facility] OR;  Service: Vascular;  Laterality: Right;  . LEFT HEART CATHETERIZATION WITH CORONARY/GRAFT ANGIOGRAM Right 05/01/2012   Procedure: LEFT HEART CATHETERIZATION WITH Isabel Caprice;  Surgeon: Ricki Rodriguez, MD;  Location: [facility];  Service: Cardiovascular;  Laterality: Right;  . triple bypass      Social History   Socioeconomic History  . Marital status: Married    Spouse name: Not on file  . Number of children: Not on file  . Years of education: Not on file  . Highest education level: Not on file  Occupational History  . Not on file  Tobacco Use  . Smoking status: Former Smoker    Years: 45.00  . Smokeless tobacco: Never Used  Vaping Use  . Vaping Use: Never used  Substance and Sexual Activity  . Alcohol use: Yes    Comment: occ  . Drug use: Yes    Types: Marijuana    Comment: last time 12/05/2019  . Sexual activity: Not on file  Other Topics Concern  . Not on file  Social History Narrative  . Not on file   Social Determinants of Health   Financial Resource Strain:   . Difficulty of Paying Living Expenses:   Food Insecurity:   . Worried About Programme researcher, broadcasting/film/video in the Last Year:   . [employer] of  Food in the Last Year:   Transportation Needs:   . Freight forwarder (Medical):   Marland Kitchen Lack of Transportation (Non-Medical):   Physical Activity:   . Days of Exercise per Week:   . Minutes of Exercise per Session:   Stress:   . Feeling of Stress :   Social Connections:   . Frequency of Communication with Friends and Family:   . Frequency of Social Gatherings with Friends and Family:   . Attends Religious Services:   . Active Member of Clubs or Organizations:   . Attends Banker Meetings:   Marland Kitchen Marital Status:   Intimate Partner Violence:   . Fear of Current or Ex-Partner:   . Emotionally Abused:   Marland Kitchen Physically  Abused:   . Sexually Abused:    Family History  Problem Relation Age of Onset  . Kidney disease Mother   . Asthma Mother   . Hypertension Father     Current Outpatient Medications  Medication Sig Dispense Refill  . aspirin EC 81 MG EC tablet Take 1 tablet (81 mg total) by mouth daily at 6 (six) AM. 30 tablet 11  . atorvastatin (LIPITOR) 40 MG tablet Take 1 tablet (40 mg total) by mouth daily. 30 tablet 11  . Cholecalciferol (VITAMIN D-3) 125 MCG (5000 UT) TABS Take 5,000 Units by mouth daily.    . citalopram (CELEXA) 10 MG tablet Take 10 mg by mouth in the morning and at bedtime.   2  . clonazePAM (KLONOPIN) 0.5 MG tablet Take 0.5 mg by mouth 2 (two) times daily as needed for anxiety.     . clopidogrel (PLAVIX) 75 MG tablet Take 1 tablet (75 mg total) by mouth at bedtime. 30 tablet 3  . HYDROcodone-acetaminophen (NORCO) 10-325 MG tablet     . isosorbide mononitrate (IMDUR) 30 MG 24 hr tablet Take 0.5 tablets (15 mg total) by mouth daily. 15 tablet 2  . lisinopril (ZESTRIL) 10 MG tablet Take 10 mg by mouth daily.    . Menthol-Methyl Salicylate (MUSCLE RUB) 10-15 % CREA Apply 1 application topically as needed for muscle pain.    . metoprolol tartrate (LOPRESSOR) 25 MG tablet Take 25 mg by mouth 2 (two) times daily.    . montelukast (SINGULAIR) 10 MG tablet     . nitroGLYCERIN (NITROLINGUAL) 0.4 MG/SPRAY spray Place 1 spray under the tongue every 5 (five) minutes as needed for chest pain. 12 g 1  . pantoprazole (PROTONIX) 40 MG tablet Take 1 tablet (40 mg total) by mouth daily. 30 tablet 11  . pregabalin (LYRICA) 75 MG capsule Take 75 mg by mouth 3 (three) times daily as needed (pain.).     Marland Kitchen rOPINIRole (REQUIP) 1 MG tablet Take 1 mg by mouth at bedtime.     . traZODone (DESYREL) 100 MG tablet Take 100 mg by mouth at bedtime.     No current facility-administered medications for this visit.    Allergies  Allergen Reactions  . Other Other (See Comments)    Tomato- hives  . Penicillins  Hives and Other (See Comments)    Has patient had a PCN reaction causing immediate rash, facial/tongue/throat swelling, SOB or lightheadedness with hypotension: No Has patient had a PCN reaction causing severe rash involving mucus membranes or skin necrosis: No Has patient had a PCN reaction that required hospitalization No Has patient had a PCN reaction occurring within the last 10 years: No If all of the above answers are "NO", then may proceed with  Cephalosporin use.     REVIEW OF SYSTEMS:   [X]  denotes positive finding, [ ]  denotes negative finding Cardiac  Comments:  Chest pain or chest pressure:    Shortness of breath upon exertion:    Short of breath when lying flat:    Irregular heart rhythm:        Vascular    Pain in calf, thigh, or hip brought on by ambulation:    Pain in feet at night that wakes you up from your sleep:     Blood clot in your veins:    Leg swelling:         Pulmonary    Oxygen at home:    Productive cough:     Wheezing:         Neurologic    Sudden weakness in arms or legs:     Sudden numbness in arms or legs:     Sudden onset of difficulty speaking or slurred speech:    Temporary loss of vision in one eye:     Problems with dizziness:         Gastrointestinal    Blood in stool:     Vomited blood:         Genitourinary    Burning when urinating:     Blood in urine:        Psychiatric    Major depression:         Hematologic    Bleeding problems:    Problems with blood clotting too easily:        Skin    Rashes or ulcers:        Constitutional    Fever or chills:      PHYSICAL EXAMINATION:  Vitals:   04/10/20 0808  BP: (!) 157/92  Pulse: 63  Resp: 20  Temp: 97.8 F (36.6 C)  TempSrc: Temporal  SpO2: 99%  Weight: 185 lb (83.9 kg)  Height: 5\' 7"  (1.702 m)    General:  WDWN in NAD; vital signs documented above Gait: Normal , unaided HENT: WNL, normocephalic Pulmonary: normal non-labored breathing  Cardiac: regular  HR,  Abdomen: soft, NT, no masses Skin: without rashes Vascular Exam/Pulses: 2+ palp right femoral pulse Extremities: without ischemic changes, without Gangrene , without cellulitis; without open wounds; brisk right DP,PT and peroneal Doppler signals. Brisk left DP signal. Motor function and sensory intact Musculoskeletal: no muscle wasting or atrophy  Neurologic: A&O X 3;  No focal weakness or paresthesias are detected Psychiatric:  The pt has Normal affect.   Non-Invasive Vascular Imaging:   03/02/2020  ABI/TBIToday's ABI   Today's TBIPrevious ABIPrevious TBI  +-------+----------------+-----------+------------+------------+  Right 0.80      0.44    0.60    0.60      +-------+----------------+-----------+------------+------------+  Left  0.94 (calcified)0.35    0.84    0.40      +-------+----------------+-----------+------------+------------+ Left  TP: 69 Right TP: 88   ASSESSMENT/PLAN:: 69 y.o. male here for follow up for intermittent discomfort of right lower extremity, burning in nature.  No claudication or rest pain.  Graft is patent. No tissue compromise. Encouraged to walk as much as he is able.  Reviewed signs and symptoms of limb threatening ischemia.  Follow-up in 3 months as previously arranged  03/04/2020, PA-C [facility] [phone]  Clinic MD:   78

## 2020-05-08 ENCOUNTER — Ambulatory Visit: Payer: Medicare HMO | Admitting: Podiatrist

## 2020-05-16 ENCOUNTER — Emergency Department (HOSPITAL_COMMUNITY)
Admission: EM | Admit: 2020-05-16 | Discharge: 2020-05-17 | Disposition: A | Discharge status: Home / Self Care | Payer: Medicare HMO | Attending: Emergency Medicine | Admitting: Emergency Medicine

## 2020-05-16 ENCOUNTER — Other Ambulatory Visit: Payer: Self-pay

## 2020-05-16 DIAGNOSIS — Z79899 Other long term (current) drug therapy: Secondary | ICD-10-CM | POA: Diagnosis not present

## 2020-05-16 DIAGNOSIS — Z20822 Contact with and (suspected) exposure to covid-19: Secondary | ICD-10-CM | POA: Diagnosis not present

## 2020-05-16 DIAGNOSIS — I130 Hypertensive heart and chronic kidney disease with heart failure and stage 1 through stage 4 chronic kidney disease, or unspecified chronic kidney disease: Secondary | ICD-10-CM | POA: Diagnosis not present

## 2020-05-16 DIAGNOSIS — I2510 Atherosclerotic heart disease of native coronary artery without angina pectoris: Secondary | ICD-10-CM | POA: Insufficient documentation

## 2020-05-16 DIAGNOSIS — I5041 Acute combined systolic (congestive) and diastolic (congestive) heart failure: Secondary | ICD-10-CM | POA: Diagnosis not present

## 2020-05-16 DIAGNOSIS — Z7982 Long term (current) use of aspirin: Secondary | ICD-10-CM | POA: Diagnosis not present

## 2020-05-16 DIAGNOSIS — Z8719 Personal history of other diseases of the digestive system: Secondary | ICD-10-CM | POA: Insufficient documentation

## 2020-05-16 DIAGNOSIS — R55 Syncope and collapse: Secondary | ICD-10-CM | POA: Diagnosis not present

## 2020-05-16 DIAGNOSIS — N183 Chronic kidney disease, stage 3 unspecified: Secondary | ICD-10-CM | POA: Diagnosis not present

## 2020-05-16 DIAGNOSIS — Z7902 Long term (current) use of antithrombotics/antiplatelets: Secondary | ICD-10-CM | POA: Diagnosis not present

## 2020-05-16 DIAGNOSIS — Z8673 Personal history of transient ischemic attack (TIA), and cerebral infarction without residual deficits: Secondary | ICD-10-CM | POA: Diagnosis not present

## 2020-05-16 LAB — COMPREHENSIVE METABOLIC PANEL
ALT: 14 U/L (ref 0–44)
AST: 26 U/L (ref 15–41)
Albumin: 3.1 g/dL — ABNORMAL LOW (ref 3.5–5.0)
Alkaline Phosphatase: 92 U/L (ref 38–126)
Anion gap: 10 (ref 5–15)
BUN: 17 mg/dL (ref 8–23)
CO2: 23 mmol/L (ref 22–32)
Calcium: 8.6 mg/dL — ABNORMAL LOW (ref 8.9–10.3)
Chloride: 108 mmol/L (ref 98–111)
Creatinine, Ser: 1.59 mg/dL — ABNORMAL HIGH (ref 0.61–1.24)
GFR calc Af Amer: 51 mL/min — ABNORMAL LOW (ref >60)
GFR calc non Af Amer: 44 mL/min — ABNORMAL LOW (ref >60)
Glucose, Bld: 106 mg/dL — ABNORMAL HIGH (ref 70–99)
Potassium: 5.4 mmol/L — ABNORMAL HIGH (ref 3.5–5.1)
Sodium: 141 mmol/L (ref 135–145)
Total Bilirubin: 1.1 mg/dL (ref 0.3–1.2)
Total Protein: 5.5 g/dL — ABNORMAL LOW (ref 6.5–8.1)

## 2020-05-16 LAB — CBC
HCT: 43.1 % (ref 39.0–52.0)
Hemoglobin: 12.6 g/dL — ABNORMAL LOW (ref 13.0–17.0)
MCH: 25.8 pg — ABNORMAL LOW (ref 26.0–34.0)
MCHC: 29.2 g/dL — ABNORMAL LOW (ref 30.0–36.0)
MCV: 88.1 fL (ref 80.0–100.0)
Platelets: 161 10*3/uL (ref 150–400)
RBC: 4.89 MIL/uL (ref 4.22–5.81)
RDW: 14.6 % (ref 11.5–15.5)
WBC: 4.4 10*3/uL (ref 4.0–10.5)
nRBC: 0 % (ref 0.0–0.2)

## 2020-05-16 LAB — ETHANOL: Alcohol, Ethyl (B): <10 mg/dL (ref <10)

## 2020-05-16 NOTE — ED Provider Notes (Signed)
[facility] EMERGENCY DEPARTMENT Provider Note   CSN: [id number] Arrival date & time: 05/16/20  2046     History Chief Complaint  Patient presents with  . Loss of Consciousness  . Hypotension    GAYLORD SEYDEL is a 69 y.o. male.  69 year old male with past medical history including CVA, PVD, CAD, CHF, hypertension, hyperlipidemia who presents with loss of consciousness.  EMS picked the patient up from home where his family reported to EMS that the patient had been drinking since yesterday.  He was sitting in a chair this evening and reportedly became unresponsive, did not fall and had no seizure activity.  On EMS arrival, the patient was reportedly hypotensive but responsive to pain.  He reported that he had smoked marijuana but denied any other drug use.  Patient received 800 mL normal saline in route and BP normal on arrival to the ED.  Patient himself denies any complaints at all.  He states that he was sitting in the chair and had to use the bathroom really badly.  He thinks that this is the reason that the episode happened because he has had a similar episode in this scenario in the distant past.  He reports that he was in his usual state of health earlier today and denies any recent illness.  No recent medication changes.  He denies any complaints.  The history is provided by the patient.  Loss of Consciousness      Past Medical History:  Diagnosis Date  . Arthritis   . Cervical spine disease   . CHF (congestive heart failure) (HCC)   . Depression   . GERD (gastroesophageal reflux disease)   . Headache    migraines in the "80's"  . Hyperlipidemia   . Hypertension   . Myocardial infarction (HCC)   . Peripheral vascular disease (HCC)   . Stroke [facility])    x 2    Patient Active Problem List   Diagnosis Date Noted  . Acute coronary syndrome (HCC) 12/21/2019  . PAD (peripheral artery disease) (HCC) 12/07/2019  . Peripheral vascular disease (HCC)  12/07/2019  . Chest pain 09/26/2017  . Cerebrovascular accident (CVA) (HCC)   . Acute combined systolic and diastolic CHF, NYHA class 3 (HCC) 07/16/2016  . Acute cerebral infarction (HCC) 07/16/2016  . Benign essential HTN 07/16/2016  . HLD (hyperlipidemia) 07/16/2016  . Non compliance w medication regimen 07/16/2016  . CKD (chronic kidney disease) stage 3, GFR 30-59 ml/min 07/16/2016  . CVA (cerebral vascular accident) (HCC) 07/16/2016  . Chest pain at rest 07/06/2014  . Hypertension 03/11/2011  . CAD (coronary artery disease) 03/11/2011    Past Surgical History:  Procedure Laterality Date  . ABDOMINAL AORTOGRAM W/LOWER EXTREMITY Bilateral 09/15/2018   Procedure: ABDOMINAL AORTOGRAM W/LOWER EXTREMITY;  Surgeon: Nada Libman, MD;  Location: [facility];  Service: Cardiovascular;  Laterality: Bilateral;  . ABDOMINAL AORTOGRAM W/LOWER EXTREMITY N/A 11/26/2019   Procedure: ABDOMINAL AORTOGRAM W/LOWER EXTREMITY;  Surgeon: Sherren Kerns, MD;  Location: [facility];  Service: Cardiovascular;  Laterality: N/A;  . breast lump removal    . BYPASS GRAFT  12/07/2019   RIGHT FEMORAL-ABOVE KNEE POPLITEAL ARTERY BYPASS GRAFT USING PROPATEN GRAFT (Right Leg Upper  . CORONARY ARTERY BYPASS GRAFT    . FEMORAL-POPLITEAL BYPASS GRAFT Right 12/07/2019   Procedure: RIGHT FEMORAL-ABOVE KNEE POPLITEAL ARTERY BYPASS GRAFT USING PROPATEN GRAFT;  Surgeon: Sherren Kerns, MD;  Location: [facility] OR;  Service: Vascular;  Laterality:  Right;  Marland Kitchen INTRAOPERATIVE ARTERIOGRAM Right 12/07/2019   Procedure: Intra Operative Arteriogram;  Surgeon: Sherren Kerns, MD;  Location: [facility] OR;  Service: Vascular;  Laterality: Right;  . LEFT HEART CATHETERIZATION WITH CORONARY/GRAFT ANGIOGRAM Right 05/01/2012   Procedure: LEFT HEART CATHETERIZATION WITH Isabel Caprice;  Surgeon: Ricki Rodriguez, MD;  Location: [facility]ATH [facility];  Service: Cardiovascular;  Laterality: Right;  . triple bypass         Family  History  Problem Relation Age of Onset  . Kidney disease Mother   . Asthma Mother   . Hypertension Father     Social History   Tobacco Use  . Smoking status: Former Smoker    Years: 45.00  . Smokeless tobacco: Never Used  Vaping Use  . Vaping Use: Never used  Substance Use Topics  . Alcohol use: Yes    Comment: occ  . Drug use: Yes    Types: Marijuana    Comment: last time 12/05/2019    Home Medications Prior to Admission medications   Medication Sig Start Date End Date Taking? Authorizing Provider  aspirin EC 81 MG EC tablet Take 1 tablet (81 mg total) by mouth daily at 6 (six) AM. 12/10/19   Rhyne, Ames Coupe, PA-C  atorvastatin (LIPITOR) 40 MG tablet Take 1 tablet (40 mg total) by mouth daily. 12/09/19 12/08/20  Sherren Kerns, MD  Cholecalciferol (VITAMIN D-3) 125 MCG (5000 UT) TABS Take 5,000 Units by mouth daily. 12/22/19   Orpah Cobb, MD  citalopram (CELEXA) 10 MG tablet Take 10 mg by mouth in the morning and at bedtime.  08/14/17   [provider]  clonazePAM (KLONOPIN) 0.5 MG tablet Take 0.5 mg by mouth 2 (two) times daily as needed for anxiety.  09/14/19   [provider]  clopidogrel (PLAVIX) 75 MG tablet Take 1 tablet (75 mg total) by mouth at bedtime. 07/18/16   Cathren Harsh, MD  HYDROcodone-acetaminophen [facility]) 10-325 MG tablet  02/02/20   [provider]  isosorbide mononitrate (IMDUR) 30 MG 24 hr tablet Take 0.5 tablets (15 mg total) by mouth daily. 12/23/19   Orpah Cobb, MD  lisinopril (ZESTRIL) 10 MG tablet Take 10 mg by mouth daily. 09/06/19   [provider]  Menthol-Methyl Salicylate (MUSCLE RUB) 10-15 % CREA Apply 1 application topically as needed for muscle pain.    [provider]  metoprolol tartrate (LOPRESSOR) 25 MG tablet Take 25 mg by mouth 2 (two) times daily.    [provider]  montelukast (SINGULAIR) 10 MG tablet  01/07/20   [provider]  nitroGLYCERIN (NITROLINGUAL) 0.4 MG/SPRAY  spray Place 1 spray under the tongue every 5 (five) minutes as needed for chest pain. 12/22/19   Orpah Cobb, MD  pantoprazole (PROTONIX) 40 MG tablet Take 1 tablet (40 mg total) by mouth daily. 12/10/19   Sherren Kerns, MD  pregabalin (LYRICA) 75 MG capsule Take 75 mg by mouth 3 (three) times daily as needed (pain.).  09/30/19   [provider]  rOPINIRole (REQUIP) 1 MG tablet Take 1 mg by mouth at bedtime.  09/06/19   [provider]  traZODone (DESYREL) 100 MG tablet Take 100 mg by mouth at bedtime. 10/29/19   [provider]    Allergies    Other and Penicillins  Review of Systems   Review of Systems  Cardiovascular: Positive for syncope.   All other systems reviewed and are negative except that which was mentioned in HPI  Physical  Exam Updated Vital Signs BP 113/76   Pulse 78   Temp 97.8 F (36.6 C) (Oral)   Resp 18   Ht 5\' 7"  (1.702 m)   Wt 81.6 kg   SpO2 96%   BMI 28.19 kg/m   Physical Exam Vitals and nursing note reviewed.  Constitutional:      General: He is not in acute distress.    Appearance: He is well-developed.  HENT:     Head: Normocephalic and atraumatic.  Eyes:     Conjunctiva/sclera: Conjunctivae normal.     Pupils: Pupils are equal, round, and reactive to light.  Cardiovascular:     Rate and Rhythm: Normal rate and regular rhythm.     Heart sounds: Normal heart sounds. No murmur heard.   Pulmonary:     Effort: Pulmonary effort is normal.     Breath sounds: Normal breath sounds.  Abdominal:     General: Bowel sounds are normal. There is no distension.     Palpations: Abdomen is soft.     Tenderness: There is no abdominal tenderness.  Musculoskeletal:     Cervical back: Neck supple.  Skin:    General: Skin is warm and dry.  Neurological:     Mental Status: He is alert and oriented to person, place, and time.     Comments: Fluent speech  Psychiatric:        Judgment: Judgment normal.     ED Results / Procedures  / Treatments   Labs (all labs ordered are listed, but only abnormal results are displayed) Labs Reviewed  COMPREHENSIVE METABOLIC PANEL - Abnormal; Notable for the following components:      Result Value   Potassium 5.4 (*)    Glucose, Bld 106 (*)    Creatinine, Ser 1.59 (*)    Calcium 8.6 (*)    Total Protein 5.5 (*)    Albumin 3.1 (*)    GFR calc non Af Amer 44 (*)    GFR calc Af Amer 51 (*)    All other components within normal limits  CBC - Abnormal; Notable for the following components:   Hemoglobin 12.6 (*)    MCH 25.8 (*)    MCHC 29.2 (*)    All other components within normal limits  SARS CORONAVIRUS 2 BY RT PCR [facility] ORDER, PERFORMED IN [facility])  ETHANOL    EKG EKG Interpretation  Date/Time:  Tuesday May 16 2020 20:49:42 EDT Ventricular Rate:  71 PR Interval:    QRS Duration: 112 QT Interval:  427 QTC Calculation: 464 R Axis:   1 Text Interpretation: Sinus rhythm Probable left atrial enlargement Incomplete right bundle branch block Nonspecific T abnormalities, lateral leads similar to previous Confirmed by [id number] [phone]) on 05/16/2020 9:12:39 PM   Radiology No results found.  Procedures Procedures (including critical care time)  Medications Ordered in ED Medications  calcium carbonate (TUMS - dosed in mg elemental calcium) chewable tablet 400 mg of elemental calcium (has no administration in time range)    ED Course  I have reviewed the triage vital signs and the nursing notes.  Pertinent labs & imaging results that were available during my care of the patient were reviewed by me and considered in my medical decision making (see chart for details).    MDM Rules/Calculators/A&P                          Well-appearing on exam, denying any complaints.  No preceding symptoms before his episode.  No symptoms suggestive of seizure activity.  Lab work today is unremarkable, stable creatinine compared to previous and hemoglobin  similar to previous, no signs of dehydration.  EKG is similar to previous.  Discussed with his cardiologist, Dr. Algie CofferKadakia, who states that his syncopal episode may have resulted from bladder distention triggering vasovagal syncope.  He can see the patient in the clinic in the morning and given that he is able to follow-up with the patient in less than 12 hours, I feel it is reasonable to have the patient follow in the clinic rather than be admitted given the current bed crisis due to COVID-19 pandemic.  Patient has had reassuring echo and stress test in April of this year.  Patient is in agreement with plan. Final Clinical Impression(s) / ED Diagnoses Final diagnoses:  Syncope, unspecified syncope type    Rx / DC Orders ED Discharge Orders    None       Eyanna Mcgonagle, Ambrose Finlandachel Morgan, MD 05/17/20 0009

## 2020-05-16 NOTE — ED Triage Notes (Signed)
Pt arrives via EMS from home where family report the pt has been drinking since yesterday. Pt was sitting in chair and became unresponsive, pt didn't fall. Upon EMS arrival pt was hypotensive, 80 palpated, and responsive to pain. Pt reported smoking mariajuana but denies any drug use. Pt received 800 mL normal saline. BP 114/74 Pt alert and oriented X 4 on arrival.

## 2020-05-17 DIAGNOSIS — R55 Syncope and collapse: Secondary | ICD-10-CM | POA: Diagnosis not present

## 2020-05-17 LAB — SARS CORONAVIRUS 2 BY RT PCR (HOSPITAL ORDER, PERFORMED IN ~~LOC~~ HOSPITAL LAB): SARS Coronavirus 2: NEGATIVE

## 2020-05-17 MED ORDER — CALCIUM CARBONATE ANTACID 500 MG PO CHEW
400.0000 mg | CHEWABLE_TABLET | Freq: Once | ORAL | Status: AC
  Administered 2020-05-17: 400 mg via ORAL
  Filled 2020-05-17: qty 2

## 2020-05-17 NOTE — ED Notes (Signed)
Discharge instructions reviewed with pt. Pt verbalized understanding.   

## 2020-05-23 ENCOUNTER — Other Ambulatory Visit: Payer: Self-pay | Admitting: *Deleted

## 2020-05-23 ENCOUNTER — Telehealth: Payer: Self-pay | Admitting: *Deleted

## 2020-05-23 DIAGNOSIS — M79604 Pain in right leg: Secondary | ICD-10-CM

## 2020-05-23 NOTE — Telephone Encounter (Signed)
Patient called and states he is having increasing pain to his right leg. He states it is difficult for him to walk due to the pain. Denies that leg is cold. Scheduled patient for RLE bypass graft [place] and to see the PA for evaluation. Patient aware to be here 11:45 am 9/15/211 for his [place] and he will see PA after that.

## 2020-05-24 ENCOUNTER — Ambulatory Visit (INDEPENDENT_AMBULATORY_CARE_PROVIDER_SITE_OTHER)
Admission: RE | Admit: 2020-05-24 | Discharge: 2020-05-24 | Disposition: A | Discharge status: Home / Self Care | Payer: Medicare HMO | Source: Ambulatory Visit | Attending: Vascular Surgery | Admitting: Vascular Surgery

## 2020-05-24 ENCOUNTER — Ambulatory Visit (INDEPENDENT_AMBULATORY_CARE_PROVIDER_SITE_OTHER): Payer: Medicare HMO | Admitting: Physician Assistant

## 2020-05-24 ENCOUNTER — Other Ambulatory Visit: Payer: Self-pay

## 2020-05-24 ENCOUNTER — Encounter: Payer: Self-pay | Admitting: *Deleted

## 2020-05-24 ENCOUNTER — Other Ambulatory Visit (HOSPITAL_COMMUNITY)
Admission: RE | Admit: 2020-05-24 | Discharge: 2020-05-24 | Disposition: A | Discharge status: Home / Self Care | Payer: Medicare HMO | Source: Ambulatory Visit | Attending: Vascular Surgery | Admitting: Vascular Surgery

## 2020-05-24 ENCOUNTER — Other Ambulatory Visit: Payer: Self-pay | Admitting: *Deleted

## 2020-05-24 VITALS — BP 155/85 | HR 83 | Temp 98.5°F | Resp 20 | Ht 67.0 in | Wt 182.8 lb

## 2020-05-24 DIAGNOSIS — I70211 Atherosclerosis of native arteries of extremities with intermittent claudication, right leg: Secondary | ICD-10-CM

## 2020-05-24 DIAGNOSIS — M79604 Pain in right leg: Secondary | ICD-10-CM

## 2020-05-24 DIAGNOSIS — Z20822 Contact with and (suspected) exposure to covid-19: Secondary | ICD-10-CM | POA: Insufficient documentation

## 2020-05-24 LAB — SARS CORONAVIRUS 2 (TAT 6-24 HRS): SARS Coronavirus 2: NEGATIVE

## 2020-05-24 NOTE — Progress Notes (Signed)
Office Note     CC:  follow up Requesting Provider:  Raymon Mutton., FNP  HPI: Marcus [name] is a 69 y.o. (11-Mar-1951) male who presents with acute onset of right calf pain. He states the pain began on Thursday. He called the office yesterday to set up appointment for evaluation of pain.  He states he had an episode of brief syncope recently when celebrating a relative's birthday.  He said he felt weak, sat down and slumped over for several moments.  EMS was called.  He was not hospitalized.  He states he has had syncopal episodes in the past and was supposed to follow-up with his cardiologist this past week but did not keep that appointment.  He has a history of coronary artery bypass graft and stent placements in the past.  He says he is compliant with aspirin statin and Plavix. He complains of bilateral foot pain with walking.  Past Medical History:  Diagnosis Date  . Arthritis   . Cervical spine disease   . CHF (congestive heart failure) (HCC)   . Depression   . GERD (gastroesophageal reflux disease)   . Headache    migraines in the "80's"  . Hyperlipidemia   . Hypertension   . Myocardial infarction (HCC)   . Peripheral vascular disease (HCC)   . Stroke (HCC)    x 2    Past Surgical History:  Procedure Laterality Date  . ABDOMINAL AORTOGRAM W/LOWER EXTREMITY Bilateral 09/15/2018   Procedure: ABDOMINAL AORTOGRAM W/LOWER EXTREMITY;  Surgeon: Nada Libman, MD;  Location: [facility];  Service: Cardiovascular;  Laterality: Bilateral;  . ABDOMINAL AORTOGRAM W/LOWER EXTREMITY N/A 11/26/2019   Procedure: ABDOMINAL AORTOGRAM W/LOWER EXTREMITY;  Surgeon: Sherren Kerns, MD;  Location: [facility];  Service: Cardiovascular;  Laterality: N/A;  . breast lump removal    . BYPASS GRAFT  12/07/2019   RIGHT FEMORAL-ABOVE KNEE POPLITEAL ARTERY BYPASS GRAFT USING PROPATEN GRAFT (Right Leg Upper  . CORONARY ARTERY BYPASS GRAFT    . FEMORAL-POPLITEAL BYPASS GRAFT Right  12/07/2019   Procedure: RIGHT FEMORAL-ABOVE KNEE POPLITEAL ARTERY BYPASS GRAFT USING PROPATEN GRAFT;  Surgeon: Sherren Kerns, MD;  Location: [facility];  Service: Vascular;  Laterality: Right;  . INTRAOPERATIVE ARTERIOGRAM Right 12/07/2019   Procedure: Intra Operative Arteriogram;  Surgeon: Sherren Kerns, MD;  Location: [facility] OR;  Service: Vascular;  Laterality: Right;  . LEFT HEART CATHETERIZATION WITH CORONARY/GRAFT ANGIOGRAM Right 05/01/2012   Procedure: LEFT HEART CATHETERIZATION WITH Isabel Caprice;  Surgeon: Ricki Rodriguez, MD;  Location: [facility];  Service: Cardiovascular;  Laterality: Right;  . triple bypass      Social History   Socioeconomic History  . Marital status: Married    Spouse name: Not on file  . Number of children: Not on file  . Years of education: Not on file  . Highest education level: Not on file  Occupational History  . Not on file  Tobacco Use  . Smoking status: Former Smoker    Years: 45.00  . Smokeless tobacco: Never Used  Vaping Use  . Vaping Use: Never used  Substance and Sexual Activity  . Alcohol use: Yes    Comment: occ  . Drug use: Yes    Types: Marijuana    Comment: last time 12/05/2019  . Sexual activity: Not on file  Other Topics Concern  . Not on file  Social History Narrative  . Not on file   Social Determinants of  Health   Financial Resource Strain:   . Difficulty of Paying Living Expenses: Not on file  Food Insecurity:   . Worried About Programme researcher, broadcasting/film/video in the Last Year: Not on file  . Ran Out of Food in the Last Year: Not on file  Transportation Needs:   . Lack of Transportation (Medical): Not on file  . Lack of Transportation (Non-Medical): Not on file  Physical Activity:   . Days of Exercise per Week: Not on file  . Minutes of Exercise per Session: Not on file  Stress:   . Feeling of Stress : Not on file  Social Connections:   . Frequency of Communication with Friends and Family: Not on file  . Frequency of  Social Gatherings with Friends and Family: Not on file  . Attends Religious Services: Not on file  . Active Member of Clubs or Organizations: Not on file  . Attends Banker Meetings: Not on file  . Marital Status: Not on file  Intimate Partner Violence:   . Fear of Current or Ex-Partner: Not on file  . Emotionally Abused: Not on file  . Physically Abused: Not on file  . Sexually Abused: Not on file   Family History  Problem Relation Age of Onset  . Kidney disease Mother   . Asthma Mother   . Hypertension Father     Current Outpatient Medications  Medication Sig Dispense Refill  . aspirin EC 81 MG EC tablet Take 1 tablet (81 mg total) by mouth daily at 6 (six) AM. 30 tablet 11  . Cholecalciferol (VITAMIN D-3) 125 MCG (5000 UT) TABS Take 5,000 Units by mouth daily.    . citalopram (CELEXA) 10 MG tablet Take 10 mg by mouth in the morning and at bedtime.   2  . clonazePAM (KLONOPIN) 0.5 MG tablet Take 0.5 mg by mouth 2 (two) times daily as needed for anxiety.     . clopidogrel (PLAVIX) 75 MG tablet Take 1 tablet (75 mg total) by mouth at bedtime. 30 tablet 3  . HYDROcodone-acetaminophen (NORCO) 10-325 MG tablet     . isosorbide mononitrate (IMDUR) 30 MG 24 hr tablet Take 0.5 tablets (15 mg total) by mouth daily. 15 tablet 2  . lisinopril (ZESTRIL) 10 MG tablet Take 10 mg by mouth daily.    . Menthol-Methyl Salicylate (MUSCLE RUB) 10-15 % CREA Apply 1 application topically as needed for muscle pain.    . metoprolol tartrate (LOPRESSOR) 25 MG tablet Take 25 mg by mouth 2 (two) times daily.    . montelukast (SINGULAIR) 10 MG tablet     . nitroGLYCERIN (NITROLINGUAL) 0.4 MG/SPRAY spray Place 1 spray under the tongue every 5 (five) minutes as needed for chest pain. 12 g 1  . omeprazole (PRILOSEC) 20 MG capsule Take 20 mg by mouth daily.    . pantoprazole (PROTONIX) 40 MG tablet Take 1 tablet (40 mg total) by mouth daily. 30 tablet 11  . pregabalin (LYRICA) 75 MG capsule Take 75  mg by mouth 3 (three) times daily as needed (pain.).     Marland Kitchen rOPINIRole (REQUIP) 1 MG tablet Take 1 mg by mouth at bedtime.     . simvastatin (ZOCOR) 40 MG tablet Take 40 mg by mouth at bedtime.    . tamsulosin (FLOMAX) 0.4 MG CAPS capsule Take 0.4 mg by mouth daily.    . traZODone (DESYREL) 100 MG tablet Take 100 mg by mouth at bedtime.     No current facility-administered  medications for this visit.    Allergies  Allergen Reactions  . Other Other (See Comments)    Tomato- hives  . Penicillins Hives and Other (See Comments)    Has patient had a PCN reaction causing immediate rash, facial/tongue/throat swelling, SOB or lightheadedness with hypotension: No Has patient had a PCN reaction causing severe rash involving mucus membranes or skin necrosis: No Has patient had a PCN reaction that required hospitalization No Has patient had a PCN reaction occurring within the last 10 years: No If all of the above answers are "NO", then may proceed with Cephalosporin use.     REVIEW OF SYSTEMS:   [X]  denotes positive finding, [ ]  denotes negative finding Cardiac  Comments:  Chest pain or chest pressure:    Shortness of breath upon exertion:    Short of breath when lying flat:    Irregular heart rhythm:        Vascular    Pain in calf, thigh, or hip brought on by ambulation: x   Pain in feet at night that wakes you up from your sleep:     Blood clot in your veins:    Leg swelling:         Pulmonary    Oxygen at home:    Productive cough:     Wheezing:         Neurologic    Sudden weakness in arms or legs:     Sudden numbness in arms or legs:     Sudden onset of difficulty speaking or slurred speech:    Temporary loss of vision in one eye:     Problems with dizziness:         Gastrointestinal    Blood in stool:     Vomited blood:         Genitourinary    Burning when urinating:     Blood in urine:        Psychiatric    Major depression:         Hematologic    Bleeding  problems:    Problems with blood clotting too easily:        Skin    Rashes or ulcers:        Constitutional    Fever or chills:      PHYSICAL EXAMINATION:  Vitals:   05/24/20 1203  BP: (!) 155/85  Pulse: 83  Resp: 20  Temp: 98.5 F (36.9 C)  TempSrc: Temporal  SpO2: 95%  Weight: 182 lb 12.8 oz (82.9 kg)  Height: 5\' 7"  (1.702 m)    General:  WDWN in NAD; vital signs documented above Gait: Unaided HENT: WNL, normocephalic Pulmonary: normal non-labored breathing , without Rales, rhonchi,  wheezing Cardiac: regular HR, without  Murmurs   Skin: without rashes Vascular Exam/Pulses: 2+ femoral pulses.  Brisk dopplerable popliteal, DP, PT and peroneal artery signals on the right Extremities: without acute ischemic changes, without Gangrene , without cellulitis; without open wounds; his right foot is warm with intact sensation and motor function Musculoskeletal: no muscle wasting or atrophy  Neurologic: A&O X 3;  No focal weakness or paresthesias are detected Psychiatric:  The pt has Normal affect.   Non-Invasive Vascular Imaging:   05/24/2020 Summary:  Right: Occluded right femoral to above knee bypass graft.     ASSESSMENT/PLAN:: 69 y.o. male here for acute right lower extremity pain and imaging findings of occluded right femoral to above-knee bypass graft.  Discussed with Dr. 05/26/2020.  We  will put him on peripheral vascular lab schedule tomorrow for arteriogram with possible thrombolysis.  Informed the patient he will need to make plans to possibly stay at least 1 night in the hospital.  Also strongly encouraged him to keep appointment with his cardiologist and have syncopal episodes evaluated.  He verbalizes understanding and is in agreement with this plan.  Milinda AntisSandra J Birdie Fetty, PA-C [facility] [phone]  Clinic MD:   Randie Heinzain

## 2020-05-25 ENCOUNTER — Encounter (HOSPITAL_COMMUNITY)
Admission: AD | Disposition: A | Discharge status: Home / Self Care | Payer: Self-pay | Source: Home / Self Care | Attending: Vascular Surgery

## 2020-05-25 ENCOUNTER — Other Ambulatory Visit: Payer: Self-pay

## 2020-05-25 ENCOUNTER — Inpatient Hospital Stay (HOSPITAL_COMMUNITY)
Admission: AD | Admit: 2020-05-25 | Discharge: 2020-05-27 | DRG: 253 | Disposition: A | Discharge status: Home / Self Care | Payer: Medicare HMO | Attending: Vascular Surgery | Admitting: Vascular Surgery

## 2020-05-25 ENCOUNTER — Inpatient Hospital Stay (HOSPITAL_COMMUNITY): Payer: Medicare HMO

## 2020-05-25 DIAGNOSIS — Z8673 Personal history of transient ischemic attack (TIA), and cerebral infarction without residual deficits: Secondary | ICD-10-CM | POA: Diagnosis not present

## 2020-05-25 DIAGNOSIS — I252 Old myocardial infarction: Secondary | ICD-10-CM | POA: Diagnosis not present

## 2020-05-25 DIAGNOSIS — Z7982 Long term (current) use of aspirin: Secondary | ICD-10-CM | POA: Diagnosis not present

## 2020-05-25 DIAGNOSIS — Z20822 Contact with and (suspected) exposure to covid-19: Secondary | ICD-10-CM | POA: Diagnosis present

## 2020-05-25 DIAGNOSIS — Z91018 Allergy to other foods: Secondary | ICD-10-CM | POA: Diagnosis not present

## 2020-05-25 DIAGNOSIS — Z79899 Other long term (current) drug therapy: Secondary | ICD-10-CM

## 2020-05-25 DIAGNOSIS — I70229 Atherosclerosis of native arteries of extremities with rest pain, unspecified extremity: Secondary | ICD-10-CM | POA: Diagnosis present

## 2020-05-25 DIAGNOSIS — Z880 Allergy status to penicillin: Secondary | ICD-10-CM

## 2020-05-25 DIAGNOSIS — E785 Hyperlipidemia, unspecified: Secondary | ICD-10-CM | POA: Diagnosis present

## 2020-05-25 DIAGNOSIS — N183 Chronic kidney disease, stage 3 unspecified: Secondary | ICD-10-CM | POA: Diagnosis present

## 2020-05-25 DIAGNOSIS — Z87891 Personal history of nicotine dependence: Secondary | ICD-10-CM | POA: Diagnosis not present

## 2020-05-25 DIAGNOSIS — Z951 Presence of aortocoronary bypass graft: Secondary | ICD-10-CM

## 2020-05-25 DIAGNOSIS — I70221 Atherosclerosis of native arteries of extremities with rest pain, right leg: Secondary | ICD-10-CM | POA: Diagnosis present

## 2020-05-25 DIAGNOSIS — I5042 Chronic combined systolic (congestive) and diastolic (congestive) heart failure: Secondary | ICD-10-CM | POA: Diagnosis present

## 2020-05-25 DIAGNOSIS — Z7902 Long term (current) use of antithrombotics/antiplatelets: Secondary | ICD-10-CM | POA: Diagnosis not present

## 2020-05-25 DIAGNOSIS — T82868A Thrombosis of vascular prosthetic devices, implants and grafts, initial encounter: Principal | ICD-10-CM | POA: Diagnosis present

## 2020-05-25 DIAGNOSIS — Y832 Surgical operation with anastomosis, bypass or graft as the cause of abnormal reaction of the patient, or of later complication, without mention of misadventure at the time of the procedure: Secondary | ICD-10-CM | POA: Diagnosis present

## 2020-05-25 DIAGNOSIS — I130 Hypertensive heart and chronic kidney disease with heart failure and stage 1 through stage 4 chronic kidney disease, or unspecified chronic kidney disease: Secondary | ICD-10-CM | POA: Diagnosis present

## 2020-05-25 DIAGNOSIS — Z79891 Long term (current) use of opiate analgesic: Secondary | ICD-10-CM | POA: Diagnosis not present

## 2020-05-25 DIAGNOSIS — K219 Gastro-esophageal reflux disease without esophagitis: Secondary | ICD-10-CM | POA: Diagnosis present

## 2020-05-25 DIAGNOSIS — F329 Major depressive disorder, single episode, unspecified: Secondary | ICD-10-CM | POA: Diagnosis present

## 2020-05-25 DIAGNOSIS — Z01818 Encounter for other preprocedural examination: Secondary | ICD-10-CM

## 2020-05-25 HISTORY — PX: LOWER EXTREMITY ANGIOGRAPHY: CATH118251

## 2020-05-25 LAB — CBC
HCT: 42.3 % (ref 39.0–52.0)
Hemoglobin: 12.9 g/dL — ABNORMAL LOW (ref 13.0–17.0)
MCH: 26.2 pg (ref 26.0–34.0)
MCHC: 30.5 g/dL (ref 30.0–36.0)
MCV: 85.8 fL (ref 80.0–100.0)
Platelets: 153 10*3/uL (ref 150–400)
RBC: 4.93 MIL/uL (ref 4.22–5.81)
RDW: 14.7 % (ref 11.5–15.5)
WBC: 5.8 10*3/uL (ref 4.0–10.5)
nRBC: 0 % (ref 0.0–0.2)

## 2020-05-25 LAB — POCT I-STAT, CHEM 8
BUN: 17 mg/dL (ref 8–23)
Calcium, Ion: 1.23 mmol/L (ref 1.15–1.40)
Chloride: 105 mmol/L (ref 98–111)
Creatinine, Ser: 1.9 mg/dL — ABNORMAL HIGH (ref 0.61–1.24)
Glucose, Bld: 98 mg/dL (ref 70–99)
HCT: 40 % (ref 39.0–52.0)
Hemoglobin: 13.6 g/dL (ref 13.0–17.0)
Potassium: 4.2 mmol/L (ref 3.5–5.1)
Sodium: 143 mmol/L (ref 135–145)
TCO2: 31 mmol/L (ref 22–32)

## 2020-05-25 LAB — HEPARIN LEVEL (UNFRACTIONATED): Heparin Unfractionated: 0.39 IU/mL (ref 0.30–0.70)

## 2020-05-25 LAB — FIBRINOGEN: Fibrinogen: 325 mg/dL (ref 210–475)

## 2020-05-25 LAB — MRSA PCR SCREENING: MRSA by PCR: NEGATIVE

## 2020-05-25 SURGERY — LOWER EXTREMITY ANGIOGRAPHY
Anesthesia: LOCAL | Laterality: Right

## 2020-05-25 MED ORDER — FENTANYL CITRATE (PF) 100 MCG/2ML IJ SOLN
INTRAMUSCULAR | Status: AC
  Filled 2020-05-25: qty 2

## 2020-05-25 MED ORDER — HEPARIN (PORCINE) 25000 UT/250ML-% IV SOLN: 800.0000 [IU]/h | INTRAVENOUS | Status: DC

## 2020-05-25 MED ORDER — ISOSORBIDE MONONITRATE ER 30 MG PO TB24
15.0000 mg | ORAL_TABLET | Freq: Every day | ORAL | Status: DC
  Administered 2020-05-25 – 2020-05-27 (×3): 15 mg via ORAL
  Filled 2020-05-25 (×3): qty 1

## 2020-05-25 MED ORDER — HEPARIN (PORCINE) IN NACL 1000-0.9 UT/500ML-% IV SOLN
INTRAVENOUS | Status: AC
  Filled 2020-05-25: qty 1000

## 2020-05-25 MED ORDER — SODIUM CHLORIDE 0.9 % IV SOLN: INTRAVENOUS | Status: DC

## 2020-05-25 MED ORDER — METOPROLOL TARTRATE 5 MG/5ML IV SOLN
5.0000 mg | Freq: Four times a day (QID) | INTRAVENOUS | Status: DC
  Administered 2020-05-25 – 2020-05-26 (×2): 5 mg via INTRAVENOUS
  Filled 2020-05-25 (×3): qty 5

## 2020-05-25 MED ORDER — HEPARIN SODIUM (PORCINE) 1000 UNIT/ML IJ SOLN
INTRAMUSCULAR | Status: AC
  Filled 2020-05-25: qty 1

## 2020-05-25 MED ORDER — MORPHINE SULFATE (PF) 4 MG/ML IV SOLN
5.0000 mg | INTRAVENOUS | Status: DC | PRN
  Administered 2020-05-25 – 2020-05-26 (×5): 5 mg via INTRAVENOUS
  Filled 2020-05-25 (×5): qty 2

## 2020-05-25 MED ORDER — HEPARIN SODIUM (PORCINE) 1000 UNIT/ML IJ SOLN
INTRAMUSCULAR | Status: DC | PRN
  Administered 2020-05-25: 9000 [IU] via INTRAVENOUS

## 2020-05-25 MED ORDER — TRAZODONE HCL 100 MG PO TABS
100.0000 mg | ORAL_TABLET | Freq: Every day | ORAL | Status: DC
  Administered 2020-05-25: 100 mg via ORAL
  Filled 2020-05-25 (×2): qty 1

## 2020-05-25 MED ORDER — IODIXANOL 320 MG/ML IV SOLN
INTRAVENOUS | Status: DC | PRN
  Administered 2020-05-25: 18 mL via INTRA_ARTERIAL

## 2020-05-25 MED ORDER — MIDAZOLAM HCL 2 MG/2ML IJ SOLN: 1.0000 mg | INTRAMUSCULAR | Status: DC | PRN

## 2020-05-25 MED ORDER — SODIUM CHLORIDE 0.9 % IV SOLN: 1.0000 mg/h | INTRAVENOUS | Status: DC

## 2020-05-25 MED ORDER — MIDAZOLAM HCL 2 MG/2ML IJ SOLN
INTRAMUSCULAR | Status: DC | PRN
  Administered 2020-05-25: 1 mg via INTRAVENOUS

## 2020-05-25 MED ORDER — LIDOCAINE HCL (PF) 1 % IJ SOLN
INTRAMUSCULAR | Status: AC
  Filled 2020-05-25: qty 30

## 2020-05-25 MED ORDER — HEPARIN (PORCINE) 25000 UT/250ML-% IV SOLN
800.0000 [IU]/h | INTRAVENOUS | Status: DC
  Administered 2020-05-25: 800 [IU]/h via INTRAVENOUS
  Filled 2020-05-25: qty 250

## 2020-05-25 MED ORDER — ONDANSETRON HCL 4 MG/2ML IJ SOLN: 4.0000 mg | Freq: Four times a day (QID) | INTRAMUSCULAR | Status: DC | PRN

## 2020-05-25 MED ORDER — MONTELUKAST SODIUM 10 MG PO TABS
10.0000 mg | ORAL_TABLET | Freq: Every day | ORAL | Status: DC
  Administered 2020-05-25 – 2020-05-26 (×2): 10 mg via ORAL
  Filled 2020-05-25 (×2): qty 1

## 2020-05-25 MED ORDER — PANTOPRAZOLE SODIUM 40 MG PO TBEC
40.0000 mg | DELAYED_RELEASE_TABLET | Freq: Every day | ORAL | Status: DC
  Administered 2020-05-25 – 2020-05-27 (×3): 40 mg via ORAL
  Filled 2020-05-25 (×3): qty 1

## 2020-05-25 MED ORDER — MIDAZOLAM HCL 2 MG/2ML IJ SOLN
INTRAMUSCULAR | Status: AC
  Filled 2020-05-25: qty 2

## 2020-05-25 MED ORDER — TAMSULOSIN HCL 0.4 MG PO CAPS
0.4000 mg | ORAL_CAPSULE | Freq: Every day | ORAL | Status: DC
  Administered 2020-05-25 – 2020-05-27 (×3): 0.4 mg via ORAL
  Filled 2020-05-25 (×3): qty 1

## 2020-05-25 MED ORDER — LIDOCAINE HCL (PF) 1 % IJ SOLN
INTRAMUSCULAR | Status: DC | PRN
  Administered 2020-05-25: 20 mL via INTRADERMAL

## 2020-05-25 MED ORDER — SIMVASTATIN 20 MG PO TABS
40.0000 mg | ORAL_TABLET | Freq: Every day | ORAL | Status: DC
  Administered 2020-05-25 – 2020-05-26 (×2): 40 mg via ORAL
  Filled 2020-05-25 (×2): qty 2

## 2020-05-25 MED ORDER — ASPIRIN 81 MG PO CHEW
81.0000 mg | CHEWABLE_TABLET | Freq: Every day | ORAL | Status: DC
  Administered 2020-05-26: 81 mg via ORAL
  Filled 2020-05-25 (×2): qty 1

## 2020-05-25 MED ORDER — HEPARIN (PORCINE) IN NACL 1000-0.9 UT/500ML-% IV SOLN
INTRAVENOUS | Status: DC | PRN
  Administered 2020-05-25 (×2): 500 mL

## 2020-05-25 MED ORDER — SODIUM CHLORIDE 0.9 % IV SOLN
1.0000 mg/h | INTRAVENOUS | Status: DC
  Administered 2020-05-25 – 2020-05-26 (×3): 1 mg/h
  Filled 2020-05-25 (×8): qty 10

## 2020-05-25 MED ORDER — PROPOFOL 1000 MG/100ML IV EMUL: 5.0000 ug/kg/min | INTRAVENOUS | Status: DC

## 2020-05-25 MED ORDER — FENTANYL CITRATE (PF) 100 MCG/2ML IJ SOLN
INTRAMUSCULAR | Status: DC | PRN
Start: 2020-05-25 — End: 2020-05-25
  Administered 2020-05-25: 50 ug via INTRAVENOUS

## 2020-05-25 MED ORDER — CLONAZEPAM 0.5 MG PO TABS
0.5000 mg | ORAL_TABLET | ORAL | Status: DC
  Administered 2020-05-25: 0.5 mg via ORAL
  Filled 2020-05-25 (×2): qty 1

## 2020-05-25 SURGICAL SUPPLY — 18 items
CATH 0.018 NAVICROSS ST 135 (CATHETERS) ×1 IMPLANT
CATH BEACON 5 .035 65 KMP TIP (CATHETERS) ×1 IMPLANT
CATH INFUS 135CMX50CM (CATHETERS) ×1 IMPLANT
CATH OMNI FLUSH 5F 65CM (CATHETERS) ×1 IMPLANT
FILTER CO2 0.2 MICRON (VASCULAR PRODUCTS) ×1 IMPLANT
KIT MICROPUNCTURE NIT STIFF (SHEATH) ×1 IMPLANT
KIT PV (KITS) ×2 IMPLANT
RESERVOIR CO2 (VASCULAR PRODUCTS) ×1 IMPLANT
SET FLUSH CO2 (MISCELLANEOUS) ×1 IMPLANT
SHEATH FLEX ANSEL ANG 6F 45CM (SHEATH) ×1 IMPLANT
SHEATH PINNACLE 5F 10CM (SHEATH) ×1 IMPLANT
SHEATH PROBE COVER 6X72 (BAG) ×1 IMPLANT
SYR MEDRAD MARK 7 150ML (SYRINGE) ×2 IMPLANT
TRANSDUCER W/STOPCOCK (MISCELLANEOUS) ×2 IMPLANT
TRAY PV CATH (CUSTOM PROCEDURE TRAY) ×2 IMPLANT
WIRE G V18X300CM (WIRE) ×1 IMPLANT
WIRE ROSEN-J .035X260CM (WIRE) ×1 IMPLANT
WIRE STARTER BENTSON 035X150 (WIRE) ×1 IMPLANT

## 2020-05-25 NOTE — H&P (Signed)
History and Physical Interval Note:  05/25/2020 1:08 PM  Marcus [name]  has presented today for surgery, with the diagnosis of clotted graft.  The various methods of treatment have been discussed with the patient and family. After consideration of risks, benefits and other options for treatment, the patient has consented to  Procedure(s): LOWER EXTREMITY ANGIOGRAPHY (Right) PERIPHERAL VASCULAR THROMBECTOMY (Right) as a surgical intervention.  The patient's history has been reviewed, patient examined, no change in status, stable for surgery.  I have reviewed the patient's chart and labs.  Questions were answered to the patient's satisfaction.    Occluded right fem AK pop prosthetic bypass  Cephus Shellinghristopher J Daniella Dewberry  Office Note     CC:  follow up Requesting Provider:  Raymon MuttonSmith, Fred A Jr., FNP  HPI: Marcus [name] is a 69 y.o. (12/10/1950) male who presents with acute onset of right calf pain. He states the pain began on Thursday. He called the office yesterday to set up appointment for evaluation of pain.  He states he had an episode of brief syncope recently when celebrating a relative's birthday.  He said he felt weak, sat down and slumped over for several moments.  EMS was called.  He was not hospitalized.  He states he has had syncopal episodes in the past and was supposed to follow-up with his cardiologist this past week but did not keep that appointment.  He has a history of coronary artery bypass graft and stent placements in the past.  He says he is compliant with aspirin statin and Plavix. He complains of bilateral foot pain with walking.      Past Medical History:  Diagnosis Date  . Arthritis   . Cervical spine disease   . CHF (congestive heart failure) (HCC)   . Depression   . GERD (gastroesophageal reflux disease)   . Headache    migraines in the "80's"  . Hyperlipidemia   . Hypertension   . Myocardial infarction (HCC)   . Peripheral vascular disease (HCC)   .  Stroke (HCC)    x 2         Past Surgical History:  Procedure Laterality Date  . ABDOMINAL AORTOGRAM W/LOWER EXTREMITY Bilateral 09/15/2018   Procedure: ABDOMINAL AORTOGRAM W/LOWER EXTREMITY;  Surgeon: Nada LibmanBrabham, Vance W, MD;  Location: [facility];  Service: Cardiovascular;  Laterality: Bilateral;  . ABDOMINAL AORTOGRAM W/LOWER EXTREMITY N/A 11/26/2019   Procedure: ABDOMINAL AORTOGRAM W/LOWER EXTREMITY;  Surgeon: Sherren KernsFields, Charles E, MD;  Location: [facility];  Service: Cardiovascular;  Laterality: N/A;  . breast lump removal    . BYPASS GRAFT  12/07/2019   RIGHT FEMORAL-ABOVE KNEE POPLITEAL ARTERY BYPASS GRAFT USING PROPATEN GRAFT (Right Leg Upper  . CORONARY ARTERY BYPASS GRAFT    . FEMORAL-POPLITEAL BYPASS GRAFT Right 12/07/2019   Procedure: RIGHT FEMORAL-ABOVE KNEE POPLITEAL ARTERY BYPASS GRAFT USING PROPATEN GRAFT;  Surgeon: Sherren KernsFields, Charles E, MD;  [facility];  Service: Vascular;  Laterality: Right;  . INTRAOPERATIVE ARTERIOGRAM Right 12/07/2019   Procedure: Intra Operative Arteriogram;  Surgeon: Sherren KernsFields, Charles E, MD;  [facility];  Service: Vascular;  Laterality: Right;  . LEFT HEART CATHETERIZATION WITH CORONARY/GRAFT ANGIOGRAM Right 05/01/2012   Procedure: LEFT HEART CATHETERIZATION WITH Isabel CapriceORONARY/GRAFT ANGIOGRAM;  Surgeon: Ricki RodriguezAjay S Kadakia, MD;  Location: [facility];  Service: Cardiovascular;  Laterality: Right;  . triple bypass      Social History        Socioeconomic History  . Marital status: Married  Spouse name: Not on file  . Number of children: Not on file  . Years of education: Not on file  . Highest education level: Not on file  Occupational History  . Not on file  Tobacco Use  . Smoking status: Former Smoker    Years: 45.00  . Smokeless tobacco: Never Used  Vaping Use  . Vaping Use: Never used  Substance and Sexual Activity  . Alcohol use: Yes    Comment: occ  . Drug use: Yes    Types: Marijuana    Comment:  last time 12/05/2019  . Sexual activity: Not on file  Other Topics Concern  . Not on file  Social History Narrative  . Not on file   Social Determinants of Health      Financial Resource Strain:   . Difficulty of Paying Living Expenses: Not on file  Food Insecurity:   . Worried About Programme researcher, broadcasting/film/video in the Last Year: Not on file  . Ran Out of Food in the Last Year: Not on file  Transportation Needs:   . Lack of Transportation (Medical): Not on file  . Lack of Transportation (Non-Medical): Not on file  Physical Activity:   . Days of Exercise per Week: Not on file  . Minutes of Exercise per Session: Not on file  Stress:   . Feeling of Stress : Not on file  Social Connections:   . Frequency of Communication with Friends and Family: Not on file  . Frequency of Social Gatherings with Friends and Family: Not on file  . Attends Religious Services: Not on file  . Active Member of Clubs or Organizations: Not on file  . Attends Banker Meetings: Not on file  . Marital Status: Not on file  Intimate Partner Violence:   . Fear of Current or Ex-Partner: Not on file  . Emotionally Abused: Not on file  . Physically Abused: Not on file  . Sexually Abused: Not on file        Family History  Problem Relation Age of Onset  . Kidney disease Mother   . Asthma Mother   . Hypertension Father           Current Outpatient Medications  Medication Sig Dispense Refill  . aspirin EC 81 MG EC tablet Take 1 tablet (81 mg total) by mouth daily at 6 (six) AM. 30 tablet 11  . Cholecalciferol (VITAMIN D-3) 125 MCG (5000 UT) TABS Take 5,000 Units by mouth daily.    . citalopram (CELEXA) 10 MG tablet Take 10 mg by mouth in the morning and at bedtime.   2  . clonazePAM (KLONOPIN) 0.5 MG tablet Take 0.5 mg by mouth 2 (two) times daily as needed for anxiety.     . clopidogrel (PLAVIX) 75 MG tablet Take 1 tablet (75 mg total) by mouth at bedtime. 30 tablet 3  .  HYDROcodone-acetaminophen (NORCO) 10-325 MG tablet     . isosorbide mononitrate (IMDUR) 30 MG 24 hr tablet Take 0.5 tablets (15 mg total) by mouth daily. 15 tablet 2  . lisinopril (ZESTRIL) 10 MG tablet Take 10 mg by mouth daily.    . Menthol-Methyl Salicylate (MUSCLE RUB) 10-15 % CREA Apply 1 application topically as needed for muscle pain.    . metoprolol tartrate (LOPRESSOR) 25 MG tablet Take 25 mg by mouth 2 (two) times daily.    . montelukast (SINGULAIR) 10 MG tablet     . nitroGLYCERIN (NITROLINGUAL) 0.4 MG/SPRAY spray Place 1  spray under the tongue every 5 (five) minutes as needed for chest pain. 12 g 1  . omeprazole (PRILOSEC) 20 MG capsule Take 20 mg by mouth daily.    . pantoprazole (PROTONIX) 40 MG tablet Take 1 tablet (40 mg total) by mouth daily. 30 tablet 11  . pregabalin (LYRICA) 75 MG capsule Take 75 mg by mouth 3 (three) times daily as needed (pain.).     Marland Kitchen rOPINIRole (REQUIP) 1 MG tablet Take 1 mg by mouth at bedtime.     . simvastatin (ZOCOR) 40 MG tablet Take 40 mg by mouth at bedtime.    . tamsulosin (FLOMAX) 0.4 MG CAPS capsule Take 0.4 mg by mouth daily.    . traZODone (DESYREL) 100 MG tablet Take 100 mg by mouth at bedtime.     No current facility-administered medications for this visit.         Allergies  Allergen Reactions  . Other Other (See Comments)    Tomato- hives  . Penicillins Hives and Other (See Comments)    Has patient had a PCN reaction causing immediate rash, facial/tongue/throat swelling, SOB or lightheadedness with hypotension: No Has patient had a PCN reaction causing severe rash involving mucus membranes or skin necrosis: No Has patient had a PCN reaction that required hospitalization No Has patient had a PCN reaction occurring within the last 10 years: No If all of the above answers are "NO", then may proceed with Cephalosporin use.     REVIEW OF SYSTEMS:   [X]  denotes positive finding, [ ]  denotes  negative finding Cardiac  Comments:  Chest pain or chest pressure:    Shortness of breath upon exertion:    Short of breath when lying flat:    Irregular heart rhythm:        Vascular    Pain in calf, thigh, or hip brought on by ambulation: x   Pain in feet at night that wakes you up from your sleep:     Blood clot in your veins:    Leg swelling:         Pulmonary    Oxygen at home:    Productive cough:     Wheezing:         Neurologic    Sudden weakness in arms or legs:     Sudden numbness in arms or legs:     Sudden onset of difficulty speaking or slurred speech:    Temporary loss of vision in one eye:     Problems with dizziness:         Gastrointestinal    Blood in stool:     Vomited blood:         Genitourinary    Burning when urinating:     Blood in urine:        Psychiatric    Major depression:         Hematologic    Bleeding problems:    Problems with blood clotting too easily:        Skin    Rashes or ulcers:        Constitutional    Fever or chills:      PHYSICAL EXAMINATION:     Vitals:   05/24/20 1203  BP: (!) 155/85  Pulse: 83  Resp: 20  Temp: 98.5 F (36.9 C)  TempSrc: Temporal  SpO2: 95%  Weight: 182 lb 12.8 oz (82.9 kg)  Height: 5\' 7"  (1.702 m)    General:  WDWN in NAD;  vital signs documented above Gait: Unaided HENT: WNL, normocephalic Pulmonary: normal non-labored breathing , without Rales, rhonchi,  wheezing Cardiac: regular HR, without  Murmurs   Skin: without rashes Vascular Exam/Pulses: 2+ femoral pulses.  Brisk dopplerable popliteal, DP, PT and peroneal artery signals on the right Extremities: without acute ischemic changes, without Gangrene , without cellulitis; without open wounds; his right foot is warm with intact sensation and motor function Musculoskeletal: no muscle wasting or atrophy       Neurologic: A&O X 3;  No  focal weakness or paresthesias are detected Psychiatric:  The pt has Normal affect.   Non-Invasive Vascular Imaging:   05/24/2020 Summary:  Right: Occluded right femoral to above knee bypass graft.     ASSESSMENT/PLAN:: 69 y.o. male here for acute right lower extremity pain and imaging findings of occluded right femoral to above-knee bypass graft.  Discussed with Dr. Randie Heinz.  We will put him on peripheral vascular lab schedule tomorrow for arteriogram with possible thrombolysis.  Informed the patient he will need to make plans to possibly stay at least 1 night in the hospital.  Also strongly encouraged him to keep appointment with his cardiologist and have syncopal episodes evaluated.  He verbalizes understanding and is in agreement with this plan.  Milinda Antis, PA-C [facility] [phone]

## 2020-05-25 NOTE — Progress Notes (Signed)
ANTICOAGULATION CONSULT NOTE - Initial Consult  Pharmacy Consult for heparin Indication: catheter directed thrombolysis  Allergies  Allergen Reactions  . Other Other (See Comments)    Tomato- hives  . Penicillins Hives and Other (See Comments)    Has patient had a PCN reaction causing immediate rash, facial/tongue/throat swelling, SOB or lightheadedness with hypotension: No Has patient had a PCN reaction causing severe rash involving mucus membranes or skin necrosis: No Has patient had a PCN reaction that required hospitalization No Has patient had a PCN reaction occurring within the last 10 years: No If all of the above answers are "NO", then may proceed with Cephalosporin use.    Patient Measurements: Height: 5\' 7"  (170.2 cm) Weight: 83 kg (183 lb) IBW/kg (Calculated) : 66.1  Vital Signs: Temp: 98.1 F (36.7 C) (09/16 0854) Temp Source: Oral (09/16 0854) BP: 123/64 (09/16 1900) Pulse Rate: 67 (09/16 1900)  Labs: Recent Labs    05/25/20 0921 05/25/20 1834  HGB 13.6 12.9*  HCT 40.0 42.3  PLT  --  153  HEPARINUNFRC  --  0.39  CREATININE 1.90*  --     Estimated Creatinine Clearance: 37.8 mL/min (A) (by C-G formula based on SCr of 1.9 mg/dL (H)).  Assessment: CC/HPI: 68 yo m presenting with right calf pain  PMH: CHF GERD HLD HTN PVD Stroke  Anticoag: thrombolysis for right femoral occlusion.  TPA + hep gtt  Renal: SCr 1.9  Heme/Onc: H&H 13.6/40  Goal of Therapy:  Heparin level 0.3 - 0.5 units/ml while on tpa Monitor platelets by anticoagulation protocol: Yes   Plan:  Continue heparin as ordered 800 units/hr Initial HL 2000 (0.3 - 0.5) Q6h while tpa infusing Daily hep lvl cbc  8:21 PM  _________________________________ Addendum: 8:21 PM  Initial hep lvl 0.39 - within goal  Continue current rate and recheck in 6 hrs

## 2020-05-25 NOTE — Op Note (Signed)
Patient name: Marcus [name] MRN: [id number] DOB: [birth year] Sex: male  05/25/2020 Pre-operative Diagnosis: Critical limb ischemia of the right lower extremity with occluded common femoral to above-knee popliteal prosthetic bypass Post-operative diagnosis:  Same Surgeon:  Cephus Shelling, MD Procedure Performed: 1.  Ultrasound-guided access of left common femoral artery 2.  CO2 aortogram including catheter selection of aorta 3.  Right lower extremity arteriogram with CO2 including selection of third order branches 4.  Placement of thrombolytic's catheter in the right common femoral to above-knee popliteal prosthetic bypass for initiation of thrombolysis  5.  45 minutes of monitored moderate conscious sedation time  Indications: 69 year old male who underwent a right common femoral to above-knee popliteal prosthetic bypass earlier this year with Dr. Darrick Penna.  After review of pictures this was a blind popliteal bypass given occlusion of the below-knee popliteal artery and tibial trifurcation and reconstitution of a distal peroneal artery.  He presented to clinic earlier this week with severe right foot pain and evidence of an occluded bypass.  Findings:   CO2 aortogram showed no flow-limiting stenosis in the aortoiliac segment.  Right lower extremity arteriogram showed a patent common femoral artery and only profunda runoff.  There was no proximal SFA stump.  The common femoral to above-knee popliteal prosthetic bypass is completely occluded.  CO2 arteriogram of the right leg only showed a island of above-knee popliteal artery and reconstitution of a very diseased peroneal distally.  Ultimately the bypass was cannulated and we were able to get a wire all the way down to the below-knee popliteal artery and place thrombolytics catheter to initiate thrombolysis in the ICU.   Procedure:  The patient was identified in the holding area and taken to room 8.  The patient was then placed supine on  the table and prepped and draped in the usual sterile fashion.  A time out was called.  Ultrasound was used to evaluate the left common femoral artery.  It was patent .  A digital ultrasound image was acquired.  A micropuncture needle was used to access the left common femoral artery under ultrasound guidance.  An 018 wire was advanced without resistance and a micropuncture sheath was placed.  The 018 wire was removed and a benson wire was placed.  The micropuncture sheath was exchanged for a 5 french sheath.  An omniflush catheter was advanced over the wire to the level of L-1.  An abdominal angiogram was obtained.  Next, using the omniflush catheter and a benson wire, the aortic bifurcation was crossed and the catheter was placed into theright external iliac artery and right runoff was obtained.  After evaluation images elected to attempt thrombolysis.  Ultimately was able to use a KMP catheter to get my wire into the bypass itself.  We exchanged for a Rosen wire for more support and placed a 6 [place] long Ansell sheath in the left groin over the aortic bifurcation.  Patient was given 100 units per kilogram heparin.  I then used a long straight catheter with a V 18 and I was able to cross the above-knee popliteal anastomosis on the distal bypass and get into the below-knee popliteal artery of the native vessel.  Ultimately the below-knee popliteal artery appears occluded below the knee and I could not get the wire to advance easily.  I elected to stop here and place thrombolytics catheter overnight and we exchanged for UniFuse catheter over the V 18 down the right leg bypass and the inner cannula was placed.  TPA will be run at 1 mg an hour through the Unifuse catheter and heparin will be run at 800 units an hour through the sheath.  Plan: Heparin will be run 800 units an hour through the sheath and TPA will be run at 1 mg an hour down the right leg thrombolytics catheter.   Cephus Shelling, MD [facility] [place] Office: [phone]

## 2020-05-25 NOTE — Progress Notes (Signed)
Dr Chestine Spore made aware of pt's creatinine

## 2020-05-26 ENCOUNTER — Encounter (HOSPITAL_COMMUNITY): Payer: Self-pay | Admitting: Vascular Surgery

## 2020-05-26 ENCOUNTER — Encounter (HOSPITAL_COMMUNITY)
Admission: AD | Disposition: A | Discharge status: Home / Self Care | Payer: Self-pay | Source: Home / Self Care | Attending: Vascular Surgery

## 2020-05-26 HISTORY — PX: LOWER EXTREMITY ANGIOGRAPHY: CATH118251

## 2020-05-26 HISTORY — PX: PERIPHERAL VASCULAR INTERVENTION: CATH118257

## 2020-05-26 LAB — BASIC METABOLIC PANEL
Anion gap: 7 (ref 5–15)
BUN: 11 mg/dL (ref 8–23)
CO2: 24 mmol/L (ref 22–32)
Calcium: 8.2 mg/dL — ABNORMAL LOW (ref 8.9–10.3)
Chloride: 110 mmol/L (ref 98–111)
Creatinine, Ser: 1.39 mg/dL — ABNORMAL HIGH (ref 0.61–1.24)
GFR calc Af Amer: 60 mL/min — ABNORMAL LOW (ref >60)
GFR calc non Af Amer: 51 mL/min — ABNORMAL LOW (ref >60)
Glucose, Bld: 113 mg/dL — ABNORMAL HIGH (ref 70–99)
Potassium: 4.1 mmol/L (ref 3.5–5.1)
Sodium: 141 mmol/L (ref 135–145)

## 2020-05-26 LAB — CBC
HCT: 40.1 % (ref 39.0–52.0)
HCT: 39.7 % (ref 39.0–52.0)
Hemoglobin: 11.9 g/dL — ABNORMAL LOW (ref 13.0–17.0)
Hemoglobin: 11.8 g/dL — ABNORMAL LOW (ref 13.0–17.0)
MCH: 26.6 pg (ref 26.0–34.0)
MCH: 25.9 pg — ABNORMAL LOW (ref 26.0–34.0)
MCHC: 29.7 g/dL — ABNORMAL LOW (ref 30.0–36.0)
MCHC: 29.7 g/dL — ABNORMAL LOW (ref 30.0–36.0)
MCV: 89.5 fL (ref 80.0–100.0)
MCV: 87.1 fL (ref 80.0–100.0)
Platelets: 135 10*3/uL — ABNORMAL LOW (ref 150–400)
Platelets: 127 10*3/uL — ABNORMAL LOW (ref 150–400)
RBC: 4.48 MIL/uL (ref 4.22–5.81)
RBC: 4.56 MIL/uL (ref 4.22–5.81)
RDW: 14.8 % (ref 11.5–15.5)
RDW: 14.8 % (ref 11.5–15.5)
WBC: 7.5 10*3/uL (ref 4.0–10.5)
WBC: 7.1 10*3/uL (ref 4.0–10.5)
nRBC: 0 % (ref 0.0–0.2)
nRBC: 0 % (ref 0.0–0.2)

## 2020-05-26 LAB — HEPARIN LEVEL (UNFRACTIONATED): Heparin Unfractionated: 0.3 IU/mL (ref 0.30–0.70)

## 2020-05-26 LAB — TRIGLYCERIDES: Triglycerides: 137 mg/dL (ref <150)

## 2020-05-26 LAB — FIBRINOGEN: Fibrinogen: 238 mg/dL (ref 210–475)

## 2020-05-26 LAB — CREATININE, SERUM
Creatinine, Ser: 1.46 mg/dL — ABNORMAL HIGH (ref 0.61–1.24)
GFR calc Af Amer: 56 mL/min — ABNORMAL LOW (ref >60)
GFR calc non Af Amer: 48 mL/min — ABNORMAL LOW (ref >60)

## 2020-05-26 SURGERY — LOWER EXTREMITY ANGIOGRAPHY
Anesthesia: LOCAL | Laterality: Right

## 2020-05-26 MED ORDER — CHLORHEXIDINE GLUCONATE CLOTH 2 % EX PADS: 6.0000 | MEDICATED_PAD | Freq: Every day | CUTANEOUS | Status: DC

## 2020-05-26 MED ORDER — ONDANSETRON HCL 4 MG/2ML IJ SOLN: 4.0000 mg | Freq: Four times a day (QID) | INTRAMUSCULAR | Status: DC | PRN

## 2020-05-26 MED ORDER — ACETAMINOPHEN 325 MG PO TABS
650.0000 mg | ORAL_TABLET | ORAL | Status: DC | PRN
  Administered 2020-05-27: 650 mg via ORAL
  Filled 2020-05-26: qty 2

## 2020-05-26 MED ORDER — FENTANYL CITRATE (PF) 100 MCG/2ML IJ SOLN
INTRAMUSCULAR | Status: AC
  Filled 2020-05-26: qty 2

## 2020-05-26 MED ORDER — OXYCODONE HCL 5 MG PO TABS: 5.0000 mg | ORAL_TABLET | ORAL | Status: DC | PRN

## 2020-05-26 MED ORDER — HEPARIN SODIUM (PORCINE) 5000 UNIT/ML IJ SOLN
5000.0000 [IU] | Freq: Three times a day (TID) | INTRAMUSCULAR | Status: DC
  Administered 2020-05-26: 5000 [IU] via SUBCUTANEOUS
  Filled 2020-05-26 (×2): qty 1

## 2020-05-26 MED ORDER — HYDRALAZINE HCL 20 MG/ML IJ SOLN: 5.0000 mg | INTRAMUSCULAR | Status: DC | PRN

## 2020-05-26 MED ORDER — SODIUM CHLORIDE 0.9% FLUSH
3.0000 mL | Freq: Two times a day (BID) | INTRAVENOUS | Status: DC
  Administered 2020-05-27: 3 mL via INTRAVENOUS

## 2020-05-26 MED ORDER — HEPARIN SODIUM (PORCINE) 1000 UNIT/ML IJ SOLN
INTRAMUSCULAR | Status: DC | PRN
  Administered 2020-05-26: 6000 [IU] via INTRAVENOUS

## 2020-05-26 MED ORDER — SODIUM CHLORIDE 0.9 % IV SOLN: INTRAVENOUS | Status: AC

## 2020-05-26 MED ORDER — MIDAZOLAM HCL 2 MG/2ML IJ SOLN
INTRAMUSCULAR | Status: AC
  Filled 2020-05-26: qty 2

## 2020-05-26 MED ORDER — CLOPIDOGREL BISULFATE 300 MG PO TABS
ORAL_TABLET | ORAL | Status: DC | PRN
  Administered 2020-05-26: 300 mg via ORAL

## 2020-05-26 MED ORDER — LIDOCAINE HCL (PF) 1 % IJ SOLN
INTRAMUSCULAR | Status: AC
  Filled 2020-05-26: qty 30

## 2020-05-26 MED ORDER — LABETALOL HCL 5 MG/ML IV SOLN: 10.0000 mg | INTRAVENOUS | Status: DC | PRN

## 2020-05-26 MED ORDER — HEPARIN SODIUM (PORCINE) 1000 UNIT/ML IJ SOLN
INTRAMUSCULAR | Status: AC
  Filled 2020-05-26: qty 1

## 2020-05-26 MED ORDER — SODIUM CHLORIDE 0.9% FLUSH: 3.0000 mL | INTRAVENOUS | Status: DC | PRN

## 2020-05-26 MED ORDER — MORPHINE SULFATE (PF) 2 MG/ML IV SOLN: 2.0000 mg | INTRAVENOUS | Status: DC | PRN

## 2020-05-26 MED ORDER — HEPARIN (PORCINE) IN NACL 1000-0.9 UT/500ML-% IV SOLN
INTRAVENOUS | Status: DC | PRN
  Administered 2020-05-26 (×2): 500 mL

## 2020-05-26 MED ORDER — SODIUM CHLORIDE 0.9 % IV SOLN: 250.0000 mL | INTRAVENOUS | Status: DC | PRN

## 2020-05-26 MED ORDER — FENTANYL CITRATE (PF) 100 MCG/2ML IJ SOLN
INTRAMUSCULAR | Status: DC | PRN
Start: 2020-05-26 — End: 2020-05-26
  Administered 2020-05-26: 50 ug via INTRAVENOUS

## 2020-05-26 MED ORDER — LIDOCAINE HCL (PF) 1 % IJ SOLN
INTRAMUSCULAR | Status: DC | PRN
  Administered 2020-05-26: 8 mL

## 2020-05-26 MED ORDER — CLOPIDOGREL BISULFATE 75 MG PO TABS
75.0000 mg | ORAL_TABLET | Freq: Every day | ORAL | Status: DC
  Administered 2020-05-27: 75 mg via ORAL
  Filled 2020-05-26: qty 1

## 2020-05-26 MED ORDER — MIDAZOLAM HCL 2 MG/2ML IJ SOLN
INTRAMUSCULAR | Status: DC | PRN
  Administered 2020-05-26: 1 mg via INTRAVENOUS

## 2020-05-26 MED ORDER — CLOPIDOGREL BISULFATE 75 MG PO TABS: 300.0000 mg | ORAL_TABLET | Freq: Once | ORAL | Status: DC

## 2020-05-26 MED ORDER — IODIXANOL 320 MG/ML IV SOLN
INTRAVENOUS | Status: DC | PRN
  Administered 2020-05-26: 65 mL

## 2020-05-26 MED ORDER — HEPARIN (PORCINE) IN NACL 1000-0.9 UT/500ML-% IV SOLN
INTRAVENOUS | Status: AC
  Filled 2020-05-26: qty 500

## 2020-05-26 MED ORDER — CLOPIDOGREL BISULFATE 300 MG PO TABS
ORAL_TABLET | ORAL | Status: AC
  Filled 2020-05-26: qty 1

## 2020-05-26 SURGICAL SUPPLY — 18 items
BAG SNAP BAND KOVER 36X36 (MISCELLANEOUS) ×2 IMPLANT
BALLN STERLING OTW 3X150X150 (BALLOONS) ×4
BALLN STERLING OTW 5X40X135 (BALLOONS) ×4
BALLOON STERLING OTW 3X150X150 (BALLOONS) ×1 IMPLANT
BALLOON STERLING OTW 5X40X135 (BALLOONS) ×1 IMPLANT
CATH QUICKCROSS .018X135CM (MICROCATHETER) ×2 IMPLANT
CLOSURE MYNX CONTROL 6F/7F (Vascular Products) ×2 IMPLANT
COVER DOME SNAP 22 D (MISCELLANEOUS) ×2 IMPLANT
GUIDEWIRE ZILIENT 12G 018 (WIRE) ×2 IMPLANT
KIT ENCORE 26 ADVANTAGE (KITS) ×2 IMPLANT
PROTECTION STATION PRESSURIZED (MISCELLANEOUS) ×4
SHEATH PINNACLE 6F 10CM (SHEATH) ×2 IMPLANT
STATION PROTECTION PRESSURIZED (MISCELLANEOUS) ×1 IMPLANT
STENT SYNERGY XD 3.0X32 (Permanent Stent) ×2 IMPLANT
STENT VIABAHN 6X50X120 (Permanent Stent) ×2 IMPLANT
TRAY PV CATH (CUSTOM PROCEDURE TRAY) ×2 IMPLANT
WIRE G V18X300CM (WIRE) ×2 IMPLANT
WIRE STARTER BENTSON 035X150 (WIRE) ×2 IMPLANT

## 2020-05-26 NOTE — Progress Notes (Addendum)
Progress Note    05/26/2020 8:49 AM 1 Day Post-Op  Subjective:  C/o "spasms" ol left lower leg   Vitals:   05/26/20 0600 05/26/20 0700  BP: 97/66   Pulse: (!) 46   Resp: (!) 8   Temp:  97.6 F (36.4 C)  SpO2: 91%     Physical Exam: Cardiac:  RRR Lungs:  nonlabored Extremities:  Left groin with cath in place. No bleeding or hematoma.  Both feet are warm with brisk Doppler signals.  Motor function and sensation intact   CBC    Component Value Date/Time   WBC 7.5 05/26/2020 0359   RBC 4.48 05/26/2020 0359   HGB 11.9 (L) 05/26/2020 0359   HCT 40.1 05/26/2020 0359   PLT 135 (L) 05/26/2020 0359   MCV 89.5 05/26/2020 0359   MCH 26.6 05/26/2020 0359   MCHC 29.7 (L) 05/26/2020 0359   RDW 14.8 05/26/2020 0359   LYMPHSABS 2.0 08/03/2018 1015   MONOABS 0.7 08/03/2018 1015   EOSABS 0.0 08/03/2018 1015   BASOSABS 0.0 08/03/2018 1015    BMET    Component Value Date/Time   NA 141 05/26/2020 0359   K 4.1 05/26/2020 0359   CL 110 05/26/2020 0359   CO2 24 05/26/2020 0359   GLUCOSE 113 (H) 05/26/2020 0359   BUN 11 05/26/2020 0359   CREATININE 1.39 (H) 05/26/2020 0359   CALCIUM 8.2 (L) 05/26/2020 0359   GFRNONAA 51 (L) 05/26/2020 0359   GFRAA 60 (L) 05/26/2020 0359     Intake/Output Summary (Last 24 hours) at 05/26/2020 0849 Last data filed at 05/26/2020 0200 Gross per 24 hour  Intake 1694 ml  Output 300 ml  Net 1394 ml    HOSPITAL MEDICATIONS Scheduled Meds: . aspirin  81 mg Oral Q0600  . Chlorhexidine Gluconate Cloth  6 each Topical Daily  . clonazePAM  0.5 mg Oral QODAY  . isosorbide mononitrate  15 mg Oral Daily  . metoprolol tartrate  5 mg Intravenous Q6H  . montelukast  10 mg Oral Daily  . pantoprazole  40 mg Oral Daily  . simvastatin  40 mg Oral QHS  . tamsulosin  0.4 mg Oral Daily  . traZODone  100 mg Oral QHS   Continuous Infusions: . sodium chloride 100 mL/hr at 05/26/20 0246  . alteplase (LIMB ISCHEMIA) 10 mg in normal saline (0.02 mg/mL)  infusion 1 mg/hr (05/26/20 0244)  . heparin 800 Units/hr (05/25/20 1530)   PRN Meds:.midazolam, morphine injection, ondansetron (ZOFRAN) IV  Assessment: 69 year old male with acute onset of right lower extremity pain found to have occluded common femoral to above-knee popliteal prosthetic bypass.  Status post CO2 aortogram and placement of thrombolytic catheter.    Vital signs stable.  Hemoglobin stable.  Creatinine improved   Plan: -Thrombolytics continue.  Return to the [facility] lab today for reassessment of right lower extremity occluded bypass graft. -DVT prophylaxis:  On alteplase/heparin infusion   Wendi Maya, PA-C [facility] [phone] 05/26/2020  8:49 AM   I have seen and evaluated the patient. I agree with the PA note as documented above.  No acute events overnight.  Had thrombolytics catheter placed in the right common femoral to above-knee popliteal prosthetic bypass yesterday for occluded bypass.  Complains of some spasms in his legs overnight.  Hemoglobin 12.9 today.  Fibrinogen down to 238.  Creatinine improved from 1.90 to 1.39.  Plan lysis catheter check in cath lab today.  Cephus Shelling, MD [facility]  Office: [phone]

## 2020-05-26 NOTE — Op Note (Signed)
    Patient name: KABIR BRANNOCK MRN: [id number] DOB: 27-Jul-1951 Sex: male  05/26/2020 Pre-operative Diagnosis: Occluded right femoral to above-knee popliteal artery bypass graft Post-operative diagnosis:  Same Surgeon:  Apolinar Junes C. Randie Heinz, MD Procedure Performed: 1.  Lytic recheck with right lower extremity angiogram 2.  Stent of right peroneal artery with 3 x 32 mm Synergy 3.  Stent of distal graft to SFA anastomosis with 6 x 50 mm Viabahn 4.  Moderate sedation with fentanyl and Versed for 46 minutes 5.  Mynx device closure left common femoral artery   Indications: 69 year old male with previous right common femoral to above-knee popliteal artery bypass in a blind popliteal configuration.  The bypass has subsequently occluded.  He has undergone lytics catheter placement in the graft he is now indicated for lytics recheck and possible intervention.  Findings: The distal anastomosis of the graft appeared to have stenosis greater than 50%.  After stenting and post dilating with 5 mm balloon there were 0% residual stenosis.  Below the knee the popliteal artery and this he reconstitutes what appears to be peroneal artery in line to the ankle.  After balloon angioplasty of the peroneal artery we had a dissection which was primarily stented there was no further dissection he now has no further residual stenosis with inline flow filling the dorsalis pedis artery via the peroneal.   Procedure:  The patient was identified in the holding area and taken to room 8.  The patient was then placed supine on the table and prepped and draped in the usual sterile fashion.  A time out was called.  The existing catheter was wired with a V 18 wire.  Right lower extremity angiography was performed through the existing sheath.  Patient was then given 6000 units of heparin.  We used a quick cross catheter V 18 to cross into the peroneal artery.  We confirmed intraluminal access.  We exchanged for a separate 018 wire then  performed balloon angioplasty of the peroneal artery with 3 mm balloon.  Completion demonstrated persistent dissection where we had crossed.  We elected then to begin balloon at 3 minutes at low pressure but dissection persisted.  This was then primarily stented with a 3 x 32 mm Synergy.  Completion demonstrated no residual dissection or stenosis there was brisk flow to the foot.  We then turned our attention towards the distal anastomosis of the graft.  This was primarily stented with a 6 x 50 mm Viabahn postdilated with 5 mm balloon.  Completion demonstrated no residual stenosis.  Satisfied we exchanged over Bentson wire for short 6 French sheath and deployed a minx device.  He tolerated procedure without immediate complication.   Contrast: 60cc  Ruble Buttler C. Randie Heinz, MD [facility] [place] Office: [phone] Pager: [phone]

## 2020-05-26 NOTE — Progress Notes (Signed)
ANTICOAGULATION CONSULT NOTE   Pharmacy Consult for heparin Indication: catheter directed thrombolysis  Allergies  Allergen Reactions  . Other Other (See Comments)    Tomato- hives  . Penicillins Hives and Other (See Comments)    Has patient had a PCN reaction causing immediate rash, facial/tongue/throat swelling, SOB or lightheadedness with hypotension: No Has patient had a PCN reaction causing severe rash involving mucus membranes or skin necrosis: No Has patient had a PCN reaction that required hospitalization No Has patient had a PCN reaction occurring within the last 10 years: No If all of the above answers are "NO", then may proceed with Cephalosporin use.    Patient Measurements: Height: 5\' 7"  (170.2 cm) Weight: 83 kg (183 lb) IBW/kg (Calculated) : 66.1  Vital Signs: Temp: 97.3 F (36.3 C) (09/17 0300) Temp Source: Oral (09/17 0300) BP: 125/61 (09/17 0200) Pulse Rate: 74 (09/17 0200)  Labs: Recent Labs    05/25/20 0921 05/25/20 0921 05/25/20 1834 05/26/20 0359  HGB 13.6   < > 12.9* 11.9*  HCT 40.0  --  42.3 40.1  PLT  --   --  153 135*  HEPARINUNFRC  --   --  0.39 0.30  CREATININE 1.90*  --   --   --    < > = values in this interval not displayed.    Estimated Creatinine Clearance: 37.8 mL/min (A) (by C-G formula based on SCr of 1.9 mg/dL (H)).  Assessment: CC/HPI: 69 yo m presenting with right calf pain  PMH: CHF GERD HLD HTN PVD Stroke  Anticoag: thrombolysis for right femoral occlusion.  TPA + hep gtt Heparin level therapeutic (0.3) on gtt at 800 units/hr. No bleeding noted.  Renal: SCr 1.9  Heme/Onc: H&H 13.6/40  Goal of Therapy:  Heparin level 0.3 - 0.5 units/ml while on tpa Monitor platelets by anticoagulation protocol: Yes   Plan:  Continue heparin 800 units/hr Heparin level q6h while TPA infusing  02-19-1988, PharmD, BCPS Please see amion for complete clinical pharmacist phone list 05/26/2020 5:24 AM

## 2020-05-26 NOTE — Plan of Care (Signed)

## 2020-05-27 ENCOUNTER — Encounter (HOSPITAL_COMMUNITY): Payer: Self-pay | Admitting: Vascular Surgery

## 2020-05-27 LAB — CBC
HCT: 38.9 % — ABNORMAL LOW (ref 39.0–52.0)
Hemoglobin: 11.7 g/dL — ABNORMAL LOW (ref 13.0–17.0)
MCH: 25.8 pg — ABNORMAL LOW (ref 26.0–34.0)
MCHC: 30.1 g/dL (ref 30.0–36.0)
MCV: 85.7 fL (ref 80.0–100.0)
Platelets: 126 10*3/uL — ABNORMAL LOW (ref 150–400)
RBC: 4.54 MIL/uL (ref 4.22–5.81)
RDW: 14.6 % (ref 11.5–15.5)
WBC: 6.6 10*3/uL (ref 4.0–10.5)
nRBC: 0 % (ref 0.0–0.2)

## 2020-05-27 LAB — BASIC METABOLIC PANEL
Anion gap: 8 (ref 5–15)
BUN: 8 mg/dL (ref 8–23)
CO2: 22 mmol/L (ref 22–32)
Calcium: 8.4 mg/dL — ABNORMAL LOW (ref 8.9–10.3)
Chloride: 110 mmol/L (ref 98–111)
Creatinine, Ser: 1.39 mg/dL — ABNORMAL HIGH (ref 0.61–1.24)
GFR calc Af Amer: 60 mL/min — ABNORMAL LOW (ref >60)
GFR calc non Af Amer: 51 mL/min — ABNORMAL LOW (ref >60)
Glucose, Bld: 99 mg/dL (ref 70–99)
Potassium: 3.8 mmol/L (ref 3.5–5.1)
Sodium: 140 mmol/L (ref 135–145)

## 2020-05-27 MED ORDER — CLOPIDOGREL BISULFATE 75 MG PO TABS
75.0000 mg | ORAL_TABLET | Freq: Every day | ORAL | 11 refills | Status: DC
Start: 2020-05-27 — End: 2021-05-02

## 2020-05-27 NOTE — Discharge Instructions (Signed)
° °  [facility] [place] ° °Discharge Instructions ° °Lower Extremity Angiogram; Angioplasty/Stenting ° °Please refer to the following instructions for your post-procedure care. Your surgeon or physician assistant will discuss any changes with you. ° °Activity ° °Avoid lifting more than 8 pounds (1 gallons of milk) for 72 hours (3 days) after your procedure. You may walk as much as you can tolerate. It's OK to drive after 72 hours. ° °Bathing/Showering ° °You may shower the day after your procedure. If you have a bandage, you may remove it at 24- 48 hours. Clean your incision site with mild soap and water. Pat the area dry with a clean towel. ° °Diet ° °Resume your pre-procedure diet. There are no special food restrictions following this procedure. All patients with peripheral vascular disease should follow a low fat/low cholesterol diet. In order to heal from your surgery, it is CRITICAL to get adequate nutrition. Your body requires vitamins, minerals, and protein. Vegetables are the best source of vitamins and minerals. Vegetables also provide the perfect balance of protein. Processed food has little nutritional value, so try to avoid this. ° °Medications ° °Resume taking all of your medications unless your doctor tells you not to. If your incision is causing pain, you may take over-the-counter pain relievers such as acetaminophen (Tylenol) ° °Follow Up ° °Follow up will be arranged at the time of your procedure. You may have an office visit scheduled or may be scheduled for surgery. Ask your surgeon if you have any questions. ° °Please call us immediately for any of the following conditions: °•Severe or worsening pain your legs or feet at rest or with walking. °•Increased pain, redness, drainage at your groin puncture site. °•Fever of 101 degrees or higher. °•If you have any mild or slow bleeding from your puncture site: lie down, apply firm constant pressure over the area with a piece of  gauze or a clean wash cloth for 30 minutes- no peeking!, call 911 right away if you are still bleeding after 30 minutes, or if the bleeding is heavy and unmanageable. ° °Reduce your risk factors of vascular disease: ° °Stop smoking. If you would like help call [facility] at 1-800-QUIT-NOW (1-800-784-8669) or [facility] at 336-586-4000. °Manage your cholesterol °Maintain a desired weight °Control your diabetes °Keep your blood pressure down ° °If you have any questions, please call the office at [phone] ° °

## 2020-05-27 NOTE — Discharge Summary (Signed)
Discharge Summary    Marcus [name] May 08, 1951 69 y.o. male  [id number]  Admission Date: 05/25/2020  Discharge Date: 05/27/2020  Physician: Cephus Shelling, MD  Admission Diagnosis: Critical lower limb ischemia [I99.8]   HPI:   This is a 69 y.o. male who presents with acuteonsetof right calf pain. He states the pain began on Thursday.He called the office yesterday to set up appointment for evaluation of pain.He states he had an episode of brief syncope recently when celebrating a relative's birthday. He said he felt weak,sat down and slumped over for several moments. EMS was called. He was not hospitalized. He states he has had syncopal episodes in the past and was supposed to follow-up with his cardiologist this past week but did not keep that appointment. He has a history of coronary artery bypass graft and stent placements in the past. He says he is compliant with aspirin statin and Plavix. He complains of bilateral foot pain with walking.  Hospital Course:  The patient was admitted to the hospital and taken to the operating room on 05/25/2020 and underwent: 1.  Ultrasound-guided access of left common femoral artery 2.  CO2 aortogram including catheter selection of aorta 3.  Right lower extremity arteriogram with CO2 including selection of third order branches 4.  Placement of thrombolytic's catheter in the right common femoral to above-knee popliteal prosthetic bypass for initiation of thrombolysis  5.  45 minutes of monitored moderate conscious sedation time    Findings:   CO2 aortogram showed no flow-limiting stenosis in the aortoiliac segment.  Right lower extremity arteriogram showed a patent common femoral artery and only profunda runoff.  There was no proximal SFA stump.  The common femoral to above-knee popliteal prosthetic bypass is completely occluded.  CO2 arteriogram of the right leg only showed a island of above-knee popliteal artery and  reconstitution of a very diseased peroneal distally.  Ultimately the bypass was cannulated and we were able to get a wire all the way down to the below-knee popliteal artery and place thrombolytics catheter to initiate thrombolysis in the ICU.  The pt tolerated the procedure well and was transported to the PACU in good condition.   On 9/17, he returned to the [facility] lab and underwent: 1.  Lytic recheck with right lower extremity angiogram 2.  Stent of right peroneal artery with 3 x 32 mm Synergy 3.  Stent of distal graft to SFA anastomosis with 6 x 50 mm Viabahn 4.  Moderate sedation with fentanyl and Versed for 46 minutes 5.  Mynx device closure left common femoral artery   Findings: The distal anastomosis of the graft appeared to have stenosis greater than 50%.  After stenting and post dilating with 5 mm balloon there were 0% residual stenosis.  Below the knee the popliteal artery and this he reconstitutes what appears to be peroneal artery in line to the ankle.  After balloon angioplasty of the peroneal artery we had a dissection which was primarily stented there was no further dissection he now has no further residual stenosis with inline flow filling the dorsalis pedis artery via the peroneal.  On 9/18, pt doing well and RLE symptoms improved.  He was started on Plavix.  He is discharged home with Plavix Rx.    The remainder of the hospital course consisted of increasing mobilization and increasing intake of solids without difficulty.  CBC    Component Value Date/Time   WBC 6.6 05/27/2020 0135   RBC 4.54 05/27/2020 0135  HGB 11.7 (L) 05/27/2020 0135   HCT 38.9 (L) 05/27/2020 0135   PLT 126 (L) 05/27/2020 0135   MCV 85.7 05/27/2020 0135   MCH 25.8 (L) 05/27/2020 0135   MCHC 30.1 05/27/2020 0135   RDW 14.6 05/27/2020 0135   LYMPHSABS 2.0 08/03/2018 1015   MONOABS 0.7 08/03/2018 1015   EOSABS 0.0 08/03/2018 1015   BASOSABS 0.0 08/03/2018 1015    BMET    Component Value Date/Time     NA 140 05/27/2020 0135   K 3.8 05/27/2020 0135   CL 110 05/27/2020 0135   CO2 22 05/27/2020 0135   GLUCOSE 99 05/27/2020 0135   BUN 8 05/27/2020 0135   CREATININE 1.39 (H) 05/27/2020 0135   CALCIUM 8.4 (L) 05/27/2020 0135   GFRNONAA 51 (L) 05/27/2020 0135   GFRAA 60 (L) 05/27/2020 0135      Discharge Instructions    Discharge patient   Complete by: As directed    Discharge disposition: 01-Home or Self Care   Discharge patient date: 05/27/2020      Discharge Diagnosis:  Critical lower limb ischemia [I99.8]  Secondary Diagnosis: Patient Active Problem List   Diagnosis Date Noted  . Critical lower limb ischemia 05/25/2020  . Acute coronary syndrome (HCC) 12/21/2019  . PAD (peripheral artery disease) (HCC) 12/07/2019  . Peripheral vascular disease (HCC) 12/07/2019  . Chest pain 09/26/2017  . Cerebrovascular accident (CVA) (HCC)   . Acute combined systolic and diastolic CHF, NYHA class 3 (HCC) 07/16/2016  . Acute cerebral infarction (HCC) 07/16/2016  . Benign essential HTN 07/16/2016  . HLD (hyperlipidemia) 07/16/2016  . Non compliance w medication regimen 07/16/2016  . CKD (chronic kidney disease) stage 3, GFR 30-59 ml/min 07/16/2016  . CVA (cerebral vascular accident) (HCC) 07/16/2016  . Chest pain at rest 07/06/2014  . Hypertension 03/11/2011  . CAD (coronary artery disease) 03/11/2011   Past Medical History:  Diagnosis Date  . Arthritis   . Cervical spine disease   . CHF (congestive heart failure) (HCC)   . Depression   . GERD (gastroesophageal reflux disease)   . Headache    migraines in the "80's"  . Hyperlipidemia   . Hypertension   . Myocardial infarction (HCC)   . Peripheral vascular disease (HCC)   . Stroke (HCC)    x 2     Allergies as of 05/27/2020      Reactions   Other Other (See Comments)   Tomato- hives   Penicillins Hives, Other (See Comments)   Has patient had a PCN reaction causing immediate rash, facial/tongue/throat swelling, SOB or  lightheadedness with hypotension: No Has patient had a PCN reaction causing severe rash involving mucus membranes or skin necrosis: No Has patient had a PCN reaction that required hospitalization No Has patient had a PCN reaction occurring within the last 10 years: No If all of the above answers are "NO", then may proceed with Cephalosporin use.      Medication List    TAKE these medications   aspirin 81 MG EC tablet Take 1 tablet (81 mg total) by mouth daily at 6 (six) AM.   clonazePAM 0.5 MG tablet Commonly known as: KLONOPIN Take 0.5 mg by mouth every other day.   clopidogrel 75 MG tablet Commonly known as: PLAVIX Take 1 tablet (75 mg total) by mouth daily with breakfast.   HYDROcodone-acetaminophen 10-325 MG tablet Commonly known as: NORCO Take 1 tablet by mouth every 6 (six) hours as needed for moderate pain.   isosorbide  mononitrate 30 MG 24 hr tablet Commonly known as: IMDUR Take 0.5 tablets (15 mg total) by mouth daily.   lisinopril 10 MG tablet Commonly known as: ZESTRIL Take 10 mg by mouth daily.   montelukast 10 MG tablet Commonly known as: SINGULAIR Take 10 mg by mouth daily.   nitroGLYCERIN 0.4 MG/SPRAY spray Commonly known as: NITROLINGUAL Place 1 spray under the tongue every 5 (five) minutes as needed for chest pain.   omeprazole 20 MG capsule Commonly known as: PRILOSEC Take 20 mg by mouth daily.   pantoprazole 40 MG tablet Commonly known as: PROTONIX Take 1 tablet (40 mg total) by mouth daily.   simvastatin 40 MG tablet Commonly known as: ZOCOR Take 40 mg by mouth at bedtime.   tamsulosin 0.4 MG Caps capsule Commonly known as: FLOMAX Take 0.4 mg by mouth daily.   traZODone 100 MG tablet Commonly known as: DESYREL Take 100 mg by mouth at bedtime.   Vitamin D-3 125 MCG (5000 UT) Tabs Take 5,000 Units by mouth daily.         Instructions:  [facility]  Discharge Instructions  Lower Extremity  Angiogram; Angioplasty/Stenting  Please refer to the following instructions for your post-procedure care. Your surgeon or physician assistant will discuss any changes with you.  Activity  Avoid lifting more than 8 pounds (1 gallons of milk) for 72 hours (3 days) after your procedure. You may walk as much as you can tolerate. It's OK to drive after 72 hours.  Bathing/Showering  You may shower the day after your procedure. If you have a bandage, you may remove it at 24- 48 hours. Clean your incision site with mild soap and water. Pat the area dry with a clean towel.  Diet  Resume your pre-procedure diet. There are no special food restrictions following this procedure. All patients with peripheral vascular disease should follow a low fat/low cholesterol diet. In order to heal from your surgery, it is CRITICAL to get adequate nutrition. Your body requires vitamins, minerals, and protein. Vegetables are the best source of vitamins and minerals. Vegetables also provide the perfect balance of protein. Processed food has little nutritional value, so try to avoid this.  Medications  Resume taking all of your medications unless your doctor tells you not to. If your incision is causing pain, you may take over-the-counter pain relievers such as acetaminophen (Tylenol)  Follow Up  Follow up will be arranged at the time of your procedure. You may have an office visit scheduled or may be scheduled for surgery. Ask your surgeon if you have any questions.  Please call us immediately for any of the following conditions: .Severe or worsening pain your legs or feet at rest or with walking. .Increased pain, redness, drainage at your groin puncture site. .Fever of 101 degrees or higher. .If you have any mild or slow bleeding from your puncture site: lie down, apply firm constant pressure over the area with a piece of gauze or a clean wash cloth for 30 minutes- no peeking!, call 911 right away if you are still  bleeding after 30 minutes, or if the bleeding is heavy and unmanageable.  Reduce your risk factors of vascular disease:  . Stop smoking. If you would like help call [facility] at 1-800-QUIT-NOW (903-176-5391) or [place] at 657-764-6184. . Manage your cholesterol . Maintain a desired weight . Control your diabetes . Keep your blood pressure down .  If you have any questions, please call the  office at [phone]  Prescriptions given: 1.  Plavix 75mg  daily #30 eleven refills  Disposition: home  Patient's condition: is Good  Follow up: 1. Dr. Darrick PennaFields in 6 weeks with ABI and RLE arterial duplex   Doreatha MassedSamantha Deb Loudin, PA-C [facility] [phone] 05/27/2020  9:10 AM

## 2020-05-27 NOTE — Progress Notes (Addendum)
  Progress Note    05/27/2020 9:02 AM 1 Day Post-Op  Subjective:  Says his foot feels better.   Tm 99.2 now afebrile HR 70's-80's NSR 130's-140's systolic 95% RA  Vitals:   05/27/20 0400 05/27/20 0800  BP:  138/75  Pulse:  72  Resp:  15  Temp: 99.2 F (37.3 C) 97.9 F (36.6 C)  SpO2:  98%    Physical Exam: Cardiac:  regular Lungs:  Non labored Incisions:  Left groin soft without hematoma Extremities:  Brisk right DP/AT and PT doppler signals   CBC    Component Value Date/Time   WBC 6.6 05/27/2020 0135   RBC 4.54 05/27/2020 0135   HGB 11.7 (L) 05/27/2020 0135   HCT 38.9 (L) 05/27/2020 0135   PLT 126 (L) 05/27/2020 0135   MCV 85.7 05/27/2020 0135   MCH 25.8 (L) 05/27/2020 0135   MCHC 30.1 05/27/2020 0135   RDW 14.6 05/27/2020 0135   LYMPHSABS 2.0 08/03/2018 1015   MONOABS 0.7 08/03/2018 1015   EOSABS 0.0 08/03/2018 1015   BASOSABS 0.0 08/03/2018 1015    BMET    Component Value Date/Time   NA 140 05/27/2020 0135   K 3.8 05/27/2020 0135   CL 110 05/27/2020 0135   CO2 22 05/27/2020 0135   GLUCOSE 99 05/27/2020 0135   BUN 8 05/27/2020 0135   CREATININE 1.39 (H) 05/27/2020 0135   CALCIUM 8.4 (L) 05/27/2020 0135   GFRNONAA 51 (L) 05/27/2020 0135   GFRAA 60 (L) 05/27/2020 0135    INR    Component Value Date/Time   INR 1.0 12/22/2019 0553     Intake/Output Summary (Last 24 hours) at 05/27/2020 0902 Last data filed at 05/26/2020 2315 Gross per 24 hour  Intake 807.9 ml  Output 1400 ml  Net -592.1 ml     Assessment:  69 y.o. male is s/p:  1.  Lytic recheck with right lower extremity angiogram 2.  Stent of right peroneal artery with 3 x 32 mm Synergy 3.  Stent of distal graft to SFA anastomosis with 6 x 50 mm Viabahn 4.  Moderate sedation with fentanyl and Versed for 46 minutes 5.  Mynx device closure left common femoral artery   1 Day Post-Op  Plan: -pt doing well this morning with brisk right PT and AT/DP doppler signals and symptoms  improved.  Pt will continue Plavix -left groin soft without hematoma -discharge home today and will f/u with Dr. Darrick Penna in 4-6 weeks with ABI and duplex.     Doreatha Massed, PA-C [facility] [phone] 05/27/2020 9:02 AM   I have interviewed and examined patient with PA and agree with assessment and plan above.  Graft is patent by physical exam now has stent in the peroneal artery with very strong peroneal signal distal.  Okay for discharge will need to continue Plavix indefinitely.  He will follow-up with PA or Dr. Darrick Penna in 4 to 6 weeks with right lower extremity duplex and ABIs.  Elvia Aydin C. Randie Heinz, MD [facility] Office: [phone] Pager: [phone]

## 2020-05-29 ENCOUNTER — Encounter (HOSPITAL_COMMUNITY): Payer: Self-pay | Admitting: Vascular Surgery

## 2020-05-29 ENCOUNTER — Telehealth: Payer: Self-pay | Admitting: *Deleted

## 2020-05-29 NOTE — Telephone Encounter (Signed)
Patient called c/o soreness and swelling of his right leg. Painful only when getting "in or out of bed", unable to bend his leg. Patient denies coldness, discoloration or bleeding. I instructed patient to walk, elevate and if pain worsens or swelling to call back and an appointment will be scheduled.  Patient voiced understanding of the instructions.  Patient was out walking while talking to me.

## 2020-06-29 ENCOUNTER — Ambulatory Visit (HOSPITAL_COMMUNITY): Payer: Medicare HMO

## 2020-06-29 ENCOUNTER — Encounter: Payer: Medicare HMO | Admitting: Vascular Surgery

## 2020-06-29 ENCOUNTER — Encounter (HOSPITAL_COMMUNITY): Payer: Medicare HMO

## 2020-06-29 ENCOUNTER — Inpatient Hospital Stay (HOSPITAL_COMMUNITY): Admission: RE | Admit: 2020-06-29 | Payer: Medicare HMO | Source: Ambulatory Visit

## 2020-07-27 ENCOUNTER — Encounter: Payer: Medicare HMO | Admitting: Vascular Surgery

## 2020-07-27 ENCOUNTER — Other Ambulatory Visit (HOSPITAL_COMMUNITY): Payer: Medicare HMO

## 2020-07-27 ENCOUNTER — Encounter (HOSPITAL_COMMUNITY): Payer: Medicare HMO

## 2020-09-07 ENCOUNTER — Other Ambulatory Visit (HOSPITAL_COMMUNITY): Payer: Medicare HMO

## 2020-09-07 ENCOUNTER — Encounter (HOSPITAL_COMMUNITY): Payer: Medicare HMO

## 2020-09-07 ENCOUNTER — Encounter: Payer: Medicare HMO | Admitting: Vascular Surgery

## 2020-09-21 ENCOUNTER — Other Ambulatory Visit: Payer: Self-pay | Admitting: *Deleted

## 2020-09-21 DIAGNOSIS — I739 Peripheral vascular disease, unspecified: Secondary | ICD-10-CM

## 2020-09-21 DIAGNOSIS — I70211 Atherosclerosis of native arteries of extremities with intermittent claudication, right leg: Secondary | ICD-10-CM

## 2020-10-05 ENCOUNTER — Other Ambulatory Visit: Payer: Self-pay

## 2020-10-05 ENCOUNTER — Ambulatory Visit (INDEPENDENT_AMBULATORY_CARE_PROVIDER_SITE_OTHER)
Admission: RE | Admit: 2020-10-05 | Discharge: 2020-10-05 | Disposition: A | Discharge status: Home / Self Care | Payer: Medicare HMO | Source: Ambulatory Visit | Attending: Vascular Surgery | Admitting: Vascular Surgery

## 2020-10-05 ENCOUNTER — Ambulatory Visit (INDEPENDENT_AMBULATORY_CARE_PROVIDER_SITE_OTHER): Payer: Medicare HMO | Admitting: Vascular Surgery

## 2020-10-05 ENCOUNTER — Ambulatory Visit (HOSPITAL_COMMUNITY)
Admission: RE | Admit: 2020-10-05 | Discharge: 2020-10-05 | Disposition: A | Discharge status: Home / Self Care | Payer: Medicare HMO | Source: Ambulatory Visit | Attending: Vascular Surgery | Admitting: Vascular Surgery

## 2020-10-05 ENCOUNTER — Encounter: Payer: Self-pay | Admitting: Vascular Surgery

## 2020-10-05 VITALS — BP 171/92 | HR 61 | Temp 98.1°F | Wt 184.1 lb

## 2020-10-05 DIAGNOSIS — I739 Peripheral vascular disease, unspecified: Secondary | ICD-10-CM

## 2020-10-05 DIAGNOSIS — I70211 Atherosclerosis of native arteries of extremities with intermittent claudication, right leg: Secondary | ICD-10-CM

## 2020-10-05 NOTE — Progress Notes (Signed)
Patient is a 70 year old male who returns for follow-up today.  He has previously undergone right femoral to above-knee popliteal bypass in March 2021 for claudication. He has one-vessel peroneal runoff to the right leg.  He underwent thrombolysis for an occluded graft May 25, 2020.  At that time he had stenting of his right peroneal artery.  The distal anastomosis was also stented with a 6 x 50 mm Viabahn.  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  He currently experiences claudication symptoms at about 3 blocks in the left leg.  He is asymptomatic in the right leg.  He has no rest pain.  He has no nonhealing wounds.  He is on a statin Plavix and aspirin.  He is not using any tobacco products.  Past Medical History:  Diagnosis Date  . Arthritis   . Cervical spine disease   . CHF (congestive heart failure) (HCC)   . Depression   . GERD (gastroesophageal reflux disease)   . Headache    migraines in the "80's"  . Hyperlipidemia   . Hypertension   . Myocardial infarction (HCC)   . Peripheral vascular disease (HCC)   . Stroke (HCC)    x 2    Past Surgical History:  Procedure Laterality Date  . ABDOMINAL AORTOGRAM W/LOWER EXTREMITY Bilateral 09/15/2018   Procedure: ABDOMINAL AORTOGRAM W/LOWER EXTREMITY;  Surgeon: Nada Libman, MD;  Location: [facility] INVASIVE CV LAB;  Service: Cardiovascular;  Laterality: Bilateral;  . ABDOMINAL AORTOGRAM W/LOWER EXTREMITY N/A 11/26/2019   Procedure: ABDOMINAL AORTOGRAM W/LOWER EXTREMITY;  Surgeon: Sherren Kerns, MD;  Location: [facility] INVASIVE CV LAB;  Service: Cardiovascular;  Laterality: N/A;  . breast lump removal    . BYPASS GRAFT  12/07/2019   RIGHT FEMORAL-ABOVE KNEE POPLITEAL ARTERY BYPASS GRAFT USING PROPATEN GRAFT (Right Leg Upper  . CORONARY ARTERY BYPASS GRAFT    . FEMORAL-POPLITEAL BYPASS GRAFT Right 12/07/2019   Procedure: RIGHT FEMORAL-ABOVE KNEE POPLITEAL ARTERY BYPASS GRAFT USING PROPATEN GRAFT;   Surgeon: Sherren Kerns, MD;  Location: [facility] OR;  Service: Vascular;  Laterality: Right;  . INTRAOPERATIVE ARTERIOGRAM Right 12/07/2019   Procedure: Intra Operative Arteriogram;  Surgeon: Sherren Kerns, MD;  Location: [facility] OR;  Service: Vascular;  Laterality: Right;  . LEFT HEART CATHETERIZATION WITH CORONARY/GRAFT ANGIOGRAM Right 05/01/2012   Procedure: LEFT HEART CATHETERIZATION WITH Isabel Caprice;  Surgeon: Ricki Rodriguez, MD;  Location: [facility] CATH LAB;  Service: Cardiovascular;  Laterality: Right;  . LOWER EXTREMITY ANGIOGRAPHY Right 05/25/2020   Procedure: LOWER EXTREMITY ANGIOGRAPHY;  Surgeon: Cephus Shelling, MD;  Location: [facility] INVASIVE CV LAB;  Service: Cardiovascular;  Laterality: Right;  . LOWER EXTREMITY ANGIOGRAPHY  05/26/2020   Procedure: Lower Extremity Angiography;  Surgeon: Maeola Harman, MD;  Location[facility] CV LAB;  Service: Cardiovascular;;  . PERIPHERAL VASCULAR INTERVENTION Right 05/26/2020   Procedure: PERIPHERAL VASCULAR INTERVENTION;  Surgeon: Maeola Harman, MD;  Location: [facility] INVASIVE CV LAB;  Service: Cardiovascular;  Laterality: Right;  PERONEAL FEM-POP BYPASS GRAFT  . triple bypass      Social History   Socioeconomic History  . Marital status: Married    Spouse name: Not on file  . Number of children: Not on file  . Years of education: Not on file  . Highest education level: Not on file  Occupational History  . Not on file  Tobacco Use  . Smoking status: Former Smoker    Years: 45.00  . Smokeless  tobacco: Never Used  Vaping Use  . Vaping Use: Never used  Substance and Sexual Activity  . Alcohol use: Yes    Comment: occ  . Drug use: Yes    Types: Marijuana    Comment: last time 12/05/2019  . Sexual activity: Not on file  Other Topics Concern  . Not on file  Social History Narrative  . Not on file   Social Determinants of Health   Financial Resource Strain: Not on file  Food Insecurity: Not on file   Transportation Needs: Not on file  Physical Activity: Not on file  Stress: Not on file  Social Connections: Not on file  Intimate Partner Violence: Not on file     Current Outpatient Medications on File Prior to Visit  Medication Sig Dispense Refill  . aspirin EC 81 MG EC tablet Take 1 tablet (81 mg total) by mouth daily at 6 (six) AM. 30 tablet 11  . Cholecalciferol (VITAMIN D-3) 125 MCG (5000 UT) TABS Take 5,000 Units by mouth daily.    . clonazePAM (KLONOPIN) 0.5 MG tablet Take 0.5 mg by mouth every other day.    . clopidogrel (PLAVIX) 75 MG tablet Take 1 tablet (75 mg total) by mouth daily with breakfast. 30 tablet 11  . HYDROcodone-acetaminophen (NORCO) 10-325 MG tablet Take 1 tablet by mouth every 6 (six) hours as needed for moderate pain.     . isosorbide mononitrate (IMDUR) 30 MG 24 hr tablet Take 0.5 tablets (15 mg total) by mouth daily. 15 tablet 2  . lisinopril (ZESTRIL) 10 MG tablet Take 10 mg by mouth daily.    . montelukast (SINGULAIR) 10 MG tablet Take 10 mg by mouth daily.     . nitroGLYCERIN (NITROLINGUAL) 0.4 MG/SPRAY spray Place 1 spray under the tongue every 5 (five) minutes as needed for chest pain. 12 g 1  . omeprazole (PRILOSEC) 20 MG capsule Take 20 mg by mouth daily.    . pantoprazole (PROTONIX) 40 MG tablet Take 1 tablet (40 mg total) by mouth daily. 30 tablet 11  . simvastatin (ZOCOR) 40 MG tablet Take 40 mg by mouth at bedtime.    . tamsulosin (FLOMAX) 0.4 MG CAPS capsule Take 0.4 mg by mouth daily.    . traZODone (DESYREL) 100 MG tablet Take 100 mg by mouth at bedtime.     No current facility-administered medications on file prior to visit.   Review of systems: He has no shortness of breath.  He has no chest pain.  Physical exam:  Vitals:   10/05/20 1529  BP: (!) 171/92  Pulse: 61  Temp: 98.1 F (36.7 C)  TempSrc: Skin  SpO2: 99%  Weight: 184 lb 1.6 oz (83.5 kg)    Extremities: No palpable pedal pulses  Skin: No open ulcer or wound  Data:  Patient had bilateral ABIs performed today which were 0.74 right and 0.94 on the left.  Vessels on the right side was noted to be calcified so ABI may be slightly elevated.  He also had a duplex ultrasound of the right lower extremity which showed the right femoral-popliteal bypass is patent with biphasic flow there was a 50 to 74% stenosis at the proximal popliteal artery and monophasic flow in the tibial vessels.  Assessment: Patent bypass right leg currently asymptomatic possible renarrowing of the popliteal artery but velocities currently are only 213 cm/s.  Left leg claudication but adequate perfusion patient not having lifestyle limiting symptoms currently.  Plan: Patient will follow up with right  lower extremity duplex exam and bilateral ABIs in 3 months time.  If his velocities on the right leg continue to increase we may need to consider repeat arteriogram.  Fabienne Bruns, MD [facility] [place] Office: [phone]

## 2020-10-09 ENCOUNTER — Other Ambulatory Visit: Payer: Self-pay

## 2020-10-09 DIAGNOSIS — I70211 Atherosclerosis of native arteries of extremities with intermittent claudication, right leg: Secondary | ICD-10-CM

## 2020-11-10 ENCOUNTER — Telehealth: Payer: Self-pay

## 2020-11-10 ENCOUNTER — Other Ambulatory Visit: Payer: Self-pay

## 2020-11-10 NOTE — Telephone Encounter (Signed)
Patient called with report of pain in bilateral legs that has been occurring for about 2 weeks. It is not constant but is severe - sometimes from knee to hip, sometimes from great toe to hip. The right side is worse - denies swelling, color or temperature change. He has had a R sided bypass in March 2021, which occluded and was fixed in September 2021. Discussed with PA and put patient on next week for bypass duplex, ABIs and follow up evaluation.

## 2020-11-10 NOTE — Telephone Encounter (Signed)
Opened in error

## 2020-11-15 ENCOUNTER — Ambulatory Visit (INDEPENDENT_AMBULATORY_CARE_PROVIDER_SITE_OTHER)
Admission: RE | Admit: 2020-11-15 | Discharge: 2020-11-15 | Disposition: A | Discharge status: Home / Self Care | Payer: Medicare HMO | Source: Ambulatory Visit | Attending: Vascular Surgery | Admitting: Vascular Surgery

## 2020-11-15 ENCOUNTER — Ambulatory Visit (INDEPENDENT_AMBULATORY_CARE_PROVIDER_SITE_OTHER): Payer: Medicare HMO | Admitting: Physician Assistant

## 2020-11-15 ENCOUNTER — Other Ambulatory Visit: Payer: Self-pay

## 2020-11-15 ENCOUNTER — Encounter: Payer: Self-pay | Admitting: Physician Assistant

## 2020-11-15 ENCOUNTER — Ambulatory Visit (HOSPITAL_COMMUNITY)
Admission: RE | Admit: 2020-11-15 | Discharge: 2020-11-15 | Disposition: A | Discharge status: Home / Self Care | Payer: Medicare HMO | Source: Ambulatory Visit | Attending: Vascular Surgery | Admitting: Vascular Surgery

## 2020-11-15 VITALS — BP 159/82 | HR 73 | Temp 98.0°F | Resp 20 | Ht 67.0 in | Wt 192.9 lb

## 2020-11-15 DIAGNOSIS — I70211 Atherosclerosis of native arteries of extremities with intermittent claudication, right leg: Secondary | ICD-10-CM

## 2020-11-15 DIAGNOSIS — I739 Peripheral vascular disease, unspecified: Secondary | ICD-10-CM | POA: Diagnosis not present

## 2020-11-15 NOTE — Progress Notes (Signed)
[facility] [place] HISTORY AND PHYSICAL   History of Present Illness:  Patient is a 70 year old male who returns for follow-up today.  He has previously undergone right femoral to above-knee popliteal bypass in March 2021 for claudication. He has one-vessel peroneal runoff to the right leg.  He underwent thrombolysis for an occluded graft May 25, 2020.  At that time he had stenting of his right peroneal artery.  The distal anastomosis was also stented with a 6 x 50 mm Viabahn.  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  He currently experiences claudication symptoms at about 3 blocks in the left leg.  He is asymptomatic in the right leg.  He has no rest pain.  He has no nonhealing wounds.  He is on a statin Plavix and aspirin.   He is here for follow up He has right knee pain that radiates to his hip.  He denise symptoms of claudication or rest pain and has no history of non healing wounds.  He has chronic lumbar pain and is followed by a pain management clinic.    Past Medical History:  Diagnosis Date  . Arthritis   . Cervical spine disease   . CHF (congestive heart failure) (HCC)   . Depression   . GERD (gastroesophageal reflux disease)   . Headache    migraines in the "80's"  . Hyperlipidemia   . Hypertension   . Myocardial infarction (HCC)   . Peripheral vascular disease (HCC)   . Stroke (HCC)    x 2    Past Surgical History:  Procedure Laterality Date  . ABDOMINAL AORTOGRAM W/LOWER EXTREMITY Bilateral 09/15/2018   Procedure: ABDOMINAL AORTOGRAM W/LOWER EXTREMITY;  Surgeon: Nada Libman, MD;  Location: [facility];  Service: Cardiovascular;  Laterality: Bilateral;  . ABDOMINAL AORTOGRAM W/LOWER EXTREMITY N/A 11/26/2019   Procedure: ABDOMINAL AORTOGRAM W/LOWER EXTREMITY;  Surgeon: Sherren Kerns, MD;  Location: [facility];  Service: Cardiovascular;  Laterality: N/A;  . breast lump removal    .  BYPASS GRAFT  12/07/2019   RIGHT FEMORAL-ABOVE KNEE POPLITEAL ARTERY BYPASS GRAFT USING PROPATEN GRAFT (Right Leg Upper  . CORONARY ARTERY BYPASS GRAFT    . FEMORAL-POPLITEAL BYPASS GRAFT Right 12/07/2019   Procedure: RIGHT FEMORAL-ABOVE KNEE POPLITEAL ARTERY BYPASS GRAFT USING PROPATEN GRAFT;  Surgeon: Sherren Kerns, MD;  Location: [facility] OR;  Service: Vascular;  Laterality: Right;  . INTRAOPERATIVE ARTERIOGRAM Right 12/07/2019   Procedure: Intra Operative Arteriogram;  Surgeon: Sherren Kerns, MD;  Location: [facility] OR;  Service: Vascular;  Laterality: Right;  . LEFT HEART CATHETERIZATION WITH CORONARY/GRAFT ANGIOGRAM Right 05/01/2012   Procedure: LEFT HEART CATHETERIZATION WITH Isabel Caprice;  Surgeon: Ricki Rodriguez, MD;  Location: [facility] CATH LAB;  Service: Cardiovascular;  Laterality: Right;  . LOWER EXTREMITY ANGIOGRAPHY Right 05/25/2020   Procedure: LOWER EXTREMITY ANGIOGRAPHY;  Surgeon: Cephus Shelling, MD;  Location: [facility];  Service: Cardiovascular;  Laterality: Right;  . LOWER EXTREMITY ANGIOGRAPHY  05/26/2020   Procedure: Lower Extremity Angiography;  Surgeon: Maeola Harman, MD;  Location: [facility] INVASIVE CV LAB;  Service: Cardiovascular;;  . PERIPHERAL VASCULAR INTERVENTION Right 05/26/2020   Procedure: PERIPHERAL VASCULAR INTERVENTION;  Surgeon: Maeola Harman, MD;  Location: [facility] INVASIVE CV LAB;  Service: Cardiovascular;  Laterality: Right;  PERONEAL FEM-POP BYPASS GRAFT  . triple bypass      ROS:   General:  No weight loss, Fever,  chills  HEENT: No recent headaches, no nasal bleeding, no visual changes, no sore throat  Neurologic: No dizziness, blackouts, seizures. No recent symptoms of stroke or mini- stroke. No recent episodes of slurred speech, or temporary blindness.  Cardiac: No recent episodes of chest pain/pressure, no shortness of breath at rest.  No shortness of breath with exertion.  Denies history of atrial fibrillation or  irregular heartbeat  Vascular: No history of rest pain in feet.  No history of claudication.  No history of non-healing ulcer, No history of DVT   Pulmonary: No home oxygen, no productive cough, no hemoptysis,  No asthma or wheezing  Musculoskeletal:  [ ]  Arthritis, [ ]  Low back pain,  [ ]  Joint pain  Hematologic:No history of hypercoagulable state.  No history of easy bleeding.  No history of anemia  Gastrointestinal: No hematochezia or melena,  No gastroesophageal reflux, no trouble swallowing  Urinary: [ ]  chronic Kidney disease, [ ]  on HD - [ ]  MWF or [ ]  TTHS, [ ]  Burning with urination, [ ]  Frequent urination, [ ]  Difficulty urinating;   Skin: No rashes  Psychological: No history of anxiety,  No history of depression  Social History Social History   Tobacco Use  . Smoking status: Former Smoker    Years: 45.00  . Smokeless tobacco: Never Used  Vaping Use  . Vaping Use: Never used  Substance Use Topics  . Alcohol use: Yes    Comment: occ  . Drug use: Yes    Types: Marijuana    Comment: last time 12/05/2019    Family History Family History  Problem Relation Age of Onset  . Kidney disease Mother   . Asthma Mother   . Hypertension Father     Allergies  Allergies  Allergen Reactions  . Other Other (See Comments)    Tomato- hives  . Penicillins Hives and Other (See Comments)    Has patient had a PCN reaction causing immediate rash, facial/tongue/throat swelling, SOB or lightheadedness with hypotension: No Has patient had a PCN reaction causing severe rash involving mucus membranes or skin necrosis: No Has patient had a PCN reaction that required hospitalization No Has patient had a PCN reaction occurring within the last 10 years: No If all of the above answers are "NO", then may proceed with Cephalosporin use.     Current Outpatient Medications  Medication Sig Dispense Refill  . aspirin EC 81 MG EC tablet Take 1 tablet (81 mg total) by mouth daily at 6 (six)  AM. 30 tablet 11  . Cholecalciferol (VITAMIN D-3) 125 MCG (5000 UT) TABS Take 5,000 Units by mouth daily.    . clonazePAM (KLONOPIN) 0.5 MG tablet Take 0.5 mg by mouth every other day.    HYDROcodone-acetaminophen (NORCO) 10-325 MG tablet Take 1 tablet by mouth every 6 (six) hours as needed for moderate pain.     . isosorbide mononitrate (IMDUR) 30 MG 24 hr tablet Take 0.5 tablets (15 mg total) by mouth daily. 15 tablet 2  . lisinopril (ZESTRIL) 10 MG tablet Take 10 mg by mouth daily.    . montelukast (SINGULAIR) 10 MG tablet Take 10 mg by mouth daily.     . nitroGLYCERIN (NITROLINGUAL) 0.4 MG/SPRAY spray Place 1 spray under the tongue every 5 (five) minutes as needed for chest pain. 12 g 1  . omeprazole (PRILOSEC) 20 MG capsule Take 20 mg by mouth daily.    . pantoprazole (PROTONIX) 40 MG tablet Take 1 tablet (40 mg  total) by mouth daily. 30 tablet 11  . simvastatin (ZOCOR) 40 MG tablet Take 40 mg by mouth at bedtime.    . tamsulosin (FLOMAX) 0.4 MG CAPS capsule Take 0.4 mg by mouth daily.    . traZODone (DESYREL) 100 MG tablet Take 100 mg by mouth at bedtime.    . clopidogrel (PLAVIX) 75 MG tablet Take 1 tablet (75 mg total) by mouth daily with breakfast. (Patient not taking: Reported on 11/15/2020) 30 tablet 11   No current facility-administered medications for this visit.    Physical Examination  There were no vitals filed for this visit.  There is no height or weight on file to calculate BMI.  General:  Alert and oriented, no acute distress HEENT: Normal Neck: No bruit or JVD Pulmonary: Clear to auscultation bilaterally Cardiac: Regular Rate and Rhythm without murmur Abdomen: Soft, non-tender, non-distended, no mass, no scars Skin: No rash Extremity Pulses:  2+ radial, brachial, femoral, dorsalis pedis, posterior tibial pulses bilaterally Musculoskeletal: No deformity or edema  Neurologic: Upper and lower extremity motor 5/5 and symmetric  DATA:  Right Graft #1:   +------------------+--------+--------+--------+--------+           PSV cm/sStenosisWaveformComments  +------------------+--------+--------+--------+--------+  Inflow      112       biphasic      +------------------+--------+--------+--------+--------+  Prox Anastomosis 150       biphasic      +------------------+--------+--------+--------+--------+  Proximal Graft  45       biphasic      +------------------+--------+--------+--------+--------+  Mid Graft     44       biphasic      +------------------+--------+--------+--------+--------+  Distal Graft   44       biphasic      +------------------+--------+--------+--------+--------+  Distal Anastomosis37       biphasic      +------------------+--------+--------+--------+--------+  Outflow      47       biphasic      +------------------+--------+--------+--------+--------+   Summary:  Right: Patent Femoral popliteal bypass graft.      ABI Findings:  +---------+------------------+-----+----------+--------+  Right  Rt Pressure (mmHg)IndexWaveform Comment   +---------+------------------+-----+----------+--------+  Brachial 182                      +---------+------------------+-----+----------+--------+  ATA   79        0.43 monophasic      +---------+------------------+-----+----------+--------+  PTA   153        0.83 monophasic      +---------+------------------+-----+----------+--------+  Great Toe72        0.39 Abnormal       +---------+------------------+-----+----------+--------+   +---------+------------------+-----+----------+-------+  Left   Lt Pressure (mmHg)IndexWaveform Comment  +---------+------------------+-----+----------+-------+  Brachial 185                       +---------+------------------+-----+----------+-------+  ATA   108        0.58 biphasic       +---------+------------------+-----+----------+-------+  PTA   208        1.12 monophasic      +---------+------------------+-----+----------+-------+  Great Toe65        0.35 Abnormal       +---------+------------------+-----+----------+-------+   +-------+-----------+-----------+------------+------------+  ABI/TBIToday's ABIToday's TBIPrevious ABIPrevious TBI  +-------+-----------+-----------+------------+------------+  Right 0.83    0.39    0.74    0.39      +-------+-----------+-----------+------------+------------+  Left  1.12    0.35    0.94    0.49      +-------+-----------+-----------+------------+------------+  Arterial wall calcification precludes accurate ankle pressures and ABIs.    Summary:  Right: Resting right ankle-brachial index indicates mild right lower  extremity arterial disease. The right toe-brachial index is abnormal.   Left: Resting left ankle-brachial index is within normal range. The left  toe-brachial index is abnormal. ABIs are unreliable.   ASSESSMENT:  PAD with history of common femoral to above-knee popliteal artery bypass in a blind popliteal configuration.  The bypass was occluded in sept. 2021.  He under went placement of lytic catheter followed by Stent of right peroneal artery with 3 x 32 mm Synergy and Stent of distal graft to SFA anastomosis with 6 x 50 mm Viabahn.    His bypass duplex shows a patent bypass and ABI have not changed since his last visit on 10/05/20.     PLAN: Stable right LE bypass.  I asked him if he could start a walking program if possible daily.  We discussed symptoms of non healing wounds and rest pain he should call.  He will be scheduled for f/u in 6 months to repeat ABI's and bypass duplex.   Mosetta Pigeon PA-C [facility] [place] Office: [phone]  MD in clinic [place]

## 2020-11-16 ENCOUNTER — Other Ambulatory Visit: Payer: Self-pay

## 2020-11-16 DIAGNOSIS — I739 Peripheral vascular disease, unspecified: Secondary | ICD-10-CM

## 2020-11-16 IMAGING — CR DG ANG/EXT/UNI/OR RIGHT
1 series · 1 of 1 positions shown · non-contrast
Comparison: None.

CLINICAL DATA: Right lower extremity bypass graft placement.

EXAM:
RIGHT TIGER/EXT/UNI/ OR

[ap ext rot]
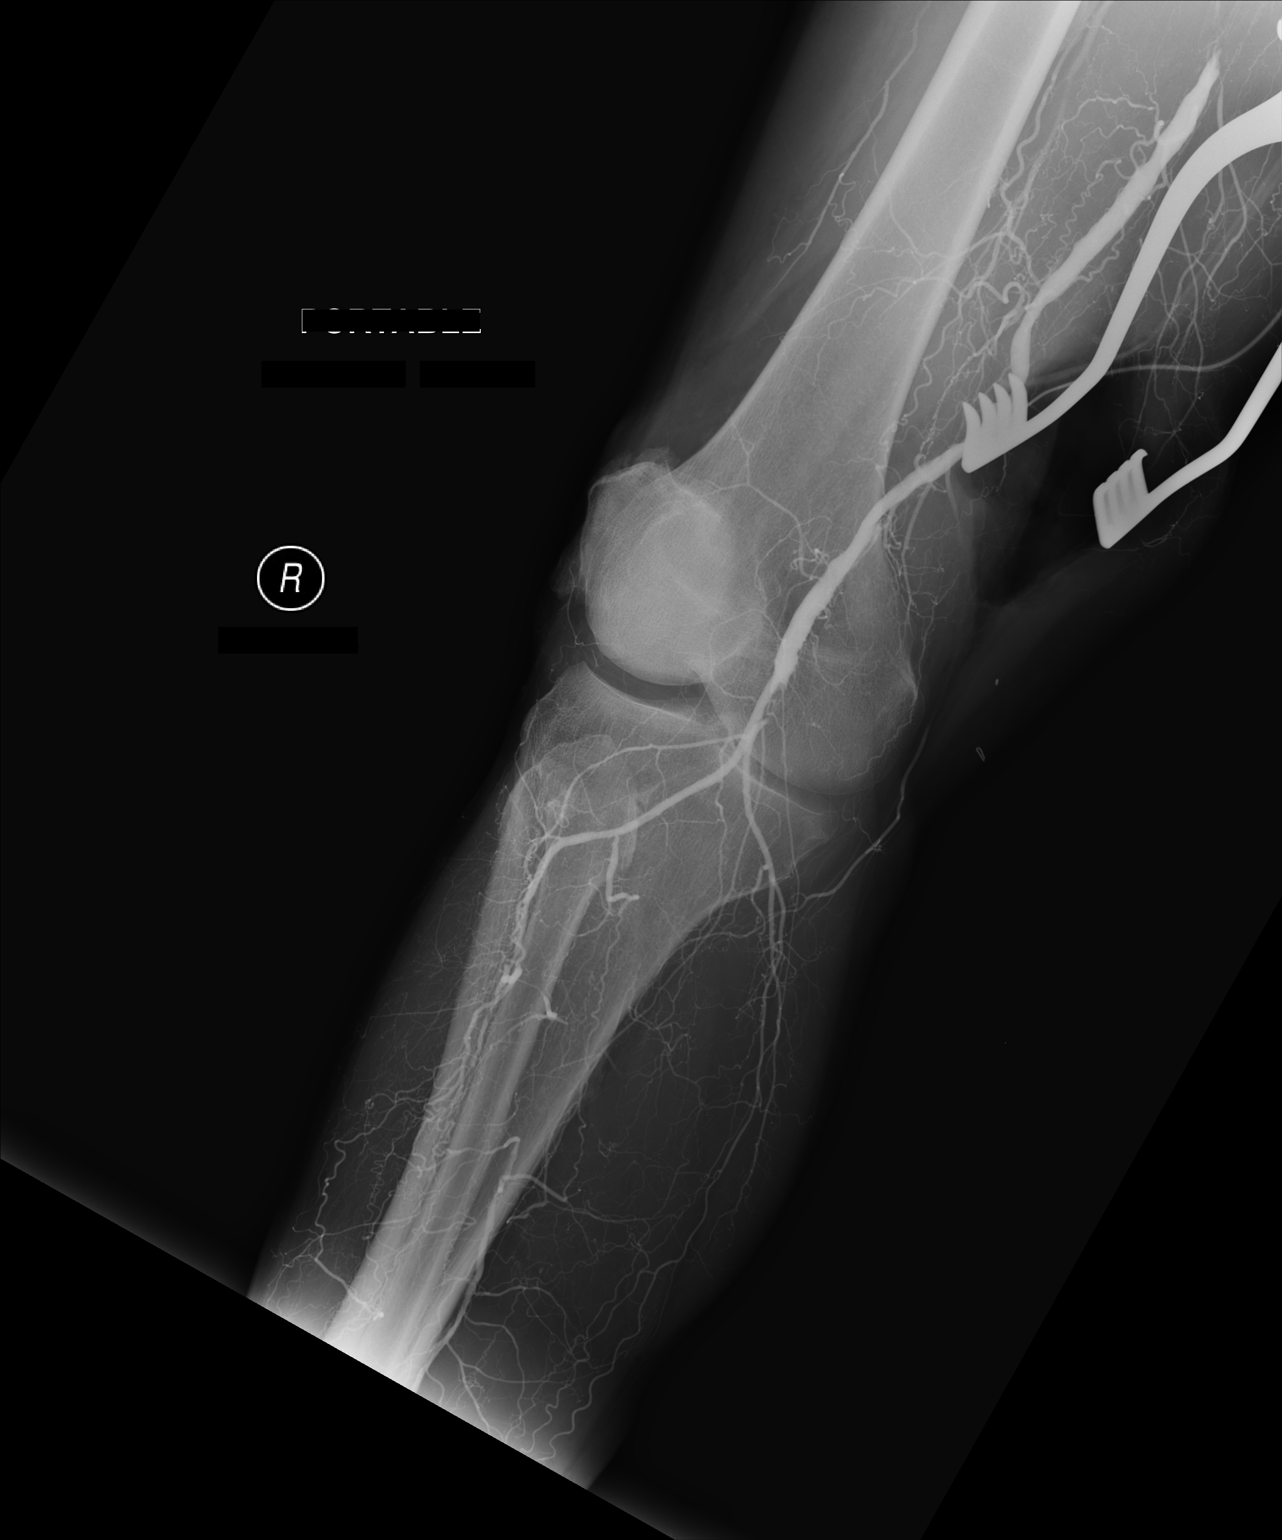

[1 of 1 positions shown; findings below may reference images not displayed]

FINDINGS: Intraoperative image demonstrates opacification of the native
popliteal artery which appears mildly diseased and patent. Below the
knee, there is occlusion all of the proximal tibial arteries in the
upper calf with collateral vessels extending into the calf.
IMPRESSION: Patent right popliteal artery without significant occlusive disease.
Proximal occlusive disease of the tibial arteries in the proximal
calf with collaterals extending below the level of occlusions.

## 2020-11-26 DIAGNOSIS — Z951 Presence of aortocoronary bypass graft: Secondary | ICD-10-CM | POA: Insufficient documentation

## 2020-12-28 ENCOUNTER — Other Ambulatory Visit (HOSPITAL_COMMUNITY): Payer: Medicare HMO

## 2020-12-28 ENCOUNTER — Encounter (HOSPITAL_COMMUNITY): Payer: Medicare HMO

## 2020-12-28 ENCOUNTER — Ambulatory Visit: Payer: Medicare HMO | Admitting: Vascular Surgery

## 2021-04-18 ENCOUNTER — Emergency Department (HOSPITAL_COMMUNITY): Payer: Medicare HMO

## 2021-04-18 ENCOUNTER — Inpatient Hospital Stay (HOSPITAL_COMMUNITY)
Admission: EM | Admit: 2021-04-18 | Discharge: 2021-05-04 | DRG: 064 | Disposition: A | Discharge status: Skilled Nursing Facility | Payer: Medicare HMO | Attending: Infectious Diseases | Admitting: Infectious Diseases

## 2021-04-18 ENCOUNTER — Encounter (HOSPITAL_COMMUNITY): Payer: Self-pay

## 2021-04-18 ENCOUNTER — Other Ambulatory Visit: Payer: Self-pay

## 2021-04-18 DIAGNOSIS — I5032 Chronic diastolic (congestive) heart failure: Secondary | ICD-10-CM | POA: Diagnosis present

## 2021-04-18 DIAGNOSIS — G8194 Hemiplegia, unspecified affecting left nondominant side: Secondary | ICD-10-CM | POA: Diagnosis not present

## 2021-04-18 DIAGNOSIS — M25572 Pain in left ankle and joints of left foot: Secondary | ICD-10-CM

## 2021-04-18 DIAGNOSIS — K5900 Constipation, unspecified: Secondary | ICD-10-CM | POA: Diagnosis present

## 2021-04-18 DIAGNOSIS — F1410 Cocaine abuse, uncomplicated: Secondary | ICD-10-CM | POA: Diagnosis present

## 2021-04-18 DIAGNOSIS — I739 Peripheral vascular disease, unspecified: Secondary | ICD-10-CM | POA: Diagnosis present

## 2021-04-18 DIAGNOSIS — E559 Vitamin D deficiency, unspecified: Secondary | ICD-10-CM | POA: Diagnosis present

## 2021-04-18 DIAGNOSIS — I619 Nontraumatic intracerebral hemorrhage, unspecified: Secondary | ICD-10-CM | POA: Diagnosis present

## 2021-04-18 DIAGNOSIS — M109 Gout, unspecified: Secondary | ICD-10-CM | POA: Diagnosis present

## 2021-04-18 DIAGNOSIS — Z841 Family history of disorders of kidney and ureter: Secondary | ICD-10-CM

## 2021-04-18 DIAGNOSIS — Z9114 Patient's other noncompliance with medication regimen: Secondary | ICD-10-CM

## 2021-04-18 DIAGNOSIS — Z79899 Other long term (current) drug therapy: Secondary | ICD-10-CM

## 2021-04-18 DIAGNOSIS — I639 Cerebral infarction, unspecified: Secondary | ICD-10-CM | POA: Diagnosis present

## 2021-04-18 DIAGNOSIS — R29702 NIHSS score 2: Secondary | ICD-10-CM | POA: Diagnosis not present

## 2021-04-18 DIAGNOSIS — R29701 NIHSS score 1: Secondary | ICD-10-CM | POA: Diagnosis not present

## 2021-04-18 DIAGNOSIS — G8929 Other chronic pain: Secondary | ICD-10-CM | POA: Diagnosis present

## 2021-04-18 DIAGNOSIS — Z8673 Personal history of transient ischemic attack (TIA), and cerebral infarction without residual deficits: Secondary | ICD-10-CM

## 2021-04-18 DIAGNOSIS — F419 Anxiety disorder, unspecified: Secondary | ICD-10-CM | POA: Diagnosis present

## 2021-04-18 DIAGNOSIS — Z823 Family history of stroke: Secondary | ICD-10-CM

## 2021-04-18 DIAGNOSIS — Z955 Presence of coronary angioplasty implant and graft: Secondary | ICD-10-CM

## 2021-04-18 DIAGNOSIS — Z91018 Allergy to other foods: Secondary | ICD-10-CM

## 2021-04-18 DIAGNOSIS — N183 Chronic kidney disease, stage 3 unspecified: Secondary | ICD-10-CM | POA: Diagnosis present

## 2021-04-18 DIAGNOSIS — I10 Essential (primary) hypertension: Secondary | ICD-10-CM | POA: Diagnosis present

## 2021-04-18 DIAGNOSIS — W1830XA Fall on same level, unspecified, initial encounter: Secondary | ICD-10-CM | POA: Diagnosis present

## 2021-04-18 DIAGNOSIS — M542 Cervicalgia: Secondary | ICD-10-CM | POA: Diagnosis present

## 2021-04-18 DIAGNOSIS — Z66 Do not resuscitate: Secondary | ICD-10-CM | POA: Diagnosis present

## 2021-04-18 DIAGNOSIS — Z79891 Long term (current) use of opiate analgesic: Secondary | ICD-10-CM

## 2021-04-18 DIAGNOSIS — I6329 Cerebral infarction due to unspecified occlusion or stenosis of other precerebral arteries: Secondary | ICD-10-CM | POA: Diagnosis not present

## 2021-04-18 DIAGNOSIS — E785 Hyperlipidemia, unspecified: Secondary | ICD-10-CM | POA: Diagnosis present

## 2021-04-18 DIAGNOSIS — M47816 Spondylosis without myelopathy or radiculopathy, lumbar region: Secondary | ICD-10-CM | POA: Diagnosis present

## 2021-04-18 DIAGNOSIS — Y9301 Activity, walking, marching and hiking: Secondary | ICD-10-CM | POA: Diagnosis present

## 2021-04-18 DIAGNOSIS — G629 Polyneuropathy, unspecified: Secondary | ICD-10-CM | POA: Diagnosis present

## 2021-04-18 DIAGNOSIS — F331 Major depressive disorder, recurrent, moderate: Secondary | ICD-10-CM

## 2021-04-18 DIAGNOSIS — I2510 Atherosclerotic heart disease of native coronary artery without angina pectoris: Secondary | ICD-10-CM | POA: Diagnosis present

## 2021-04-18 DIAGNOSIS — R29703 NIHSS score 3: Secondary | ICD-10-CM | POA: Diagnosis present

## 2021-04-18 DIAGNOSIS — N1831 Chronic kidney disease, stage 3a: Secondary | ICD-10-CM | POA: Diagnosis present

## 2021-04-18 DIAGNOSIS — Z87891 Personal history of nicotine dependence: Secondary | ICD-10-CM

## 2021-04-18 DIAGNOSIS — R109 Unspecified abdominal pain: Secondary | ICD-10-CM

## 2021-04-18 DIAGNOSIS — U071 COVID-19: Secondary | ICD-10-CM | POA: Diagnosis not present

## 2021-04-18 DIAGNOSIS — K219 Gastro-esophageal reflux disease without esophagitis: Secondary | ICD-10-CM | POA: Diagnosis present

## 2021-04-18 DIAGNOSIS — Z951 Presence of aortocoronary bypass graft: Secondary | ICD-10-CM

## 2021-04-18 DIAGNOSIS — E538 Deficiency of other specified B group vitamins: Secondary | ICD-10-CM | POA: Diagnosis present

## 2021-04-18 DIAGNOSIS — M459 Ankylosing spondylitis of unspecified sites in spine: Secondary | ICD-10-CM | POA: Diagnosis present

## 2021-04-18 DIAGNOSIS — R29704 NIHSS score 4: Secondary | ICD-10-CM | POA: Diagnosis not present

## 2021-04-18 DIAGNOSIS — R140 Abdominal distension (gaseous): Secondary | ICD-10-CM

## 2021-04-18 DIAGNOSIS — I252 Old myocardial infarction: Secondary | ICD-10-CM

## 2021-04-18 DIAGNOSIS — Z8249 Family history of ischemic heart disease and other diseases of the circulatory system: Secondary | ICD-10-CM

## 2021-04-18 DIAGNOSIS — L89621 Pressure ulcer of left heel, stage 1: Secondary | ICD-10-CM | POA: Diagnosis present

## 2021-04-18 DIAGNOSIS — Z880 Allergy status to penicillin: Secondary | ICD-10-CM

## 2021-04-18 DIAGNOSIS — R45851 Suicidal ideations: Secondary | ICD-10-CM | POA: Diagnosis present

## 2021-04-18 DIAGNOSIS — I130 Hypertensive heart and chronic kidney disease with heart failure and stage 1 through stage 4 chronic kidney disease, or unspecified chronic kidney disease: Secondary | ICD-10-CM | POA: Diagnosis present

## 2021-04-18 DIAGNOSIS — I63532 Cerebral infarction due to unspecified occlusion or stenosis of left posterior cerebral artery: Secondary | ICD-10-CM | POA: Diagnosis present

## 2021-04-18 DIAGNOSIS — Y92002 Bathroom of unspecified non-institutional (private) residence single-family (private) house as the place of occurrence of the external cause: Secondary | ICD-10-CM

## 2021-04-18 LAB — TROPONIN I (HIGH SENSITIVITY)
Troponin I (High Sensitivity): 9 ng/L (ref <18)
Troponin I (High Sensitivity): 11 ng/L (ref <18)

## 2021-04-18 LAB — APTT: aPTT: 29 seconds (ref 24–36)

## 2021-04-18 LAB — CBC WITH DIFFERENTIAL/PLATELET
Abs Immature Granulocytes: 0.02 10*3/uL (ref 0.00–0.07)
Basophils Absolute: 0 10*3/uL (ref 0.0–0.1)
Basophils Relative: 0 %
Eosinophils Absolute: 0 10*3/uL (ref 0.0–0.5)
Eosinophils Relative: 1 %
HCT: 45.8 % (ref 39.0–52.0)
Hemoglobin: 14.3 g/dL (ref 13.0–17.0)
Immature Granulocytes: 0 %
Lymphocytes Relative: 31 %
Lymphs Abs: 1.6 10*3/uL (ref 0.7–4.0)
MCH: 27 pg (ref 26.0–34.0)
MCHC: 31.2 g/dL (ref 30.0–36.0)
MCV: 86.4 fL (ref 80.0–100.0)
Monocytes Absolute: 0.5 10*3/uL (ref 0.1–1.0)
Monocytes Relative: 9 %
Neutro Abs: 3 10*3/uL (ref 1.7–7.7)
Neutrophils Relative %: 59 %
Platelets: 157 10*3/uL (ref 150–400)
RBC: 5.3 MIL/uL (ref 4.22–5.81)
RDW: 14.6 % (ref 11.5–15.5)
WBC: 5.2 10*3/uL (ref 4.0–10.5)
nRBC: 0 % (ref 0.0–0.2)

## 2021-04-18 LAB — COMPREHENSIVE METABOLIC PANEL
ALT: 19 U/L (ref 0–44)
AST: 26 U/L (ref 15–41)
Albumin: 3.7 g/dL (ref 3.5–5.0)
Alkaline Phosphatase: 111 U/L (ref 38–126)
Anion gap: 7 (ref 5–15)
BUN: 9 mg/dL (ref 8–23)
CO2: 26 mmol/L (ref 22–32)
Calcium: 9.3 mg/dL (ref 8.9–10.3)
Chloride: 105 mmol/L (ref 98–111)
Creatinine, Ser: 1.48 mg/dL — ABNORMAL HIGH (ref 0.61–1.24)
GFR, Estimated: 51 mL/min — ABNORMAL LOW (ref >60)
Glucose, Bld: 93 mg/dL (ref 70–99)
Potassium: 4.3 mmol/L (ref 3.5–5.1)
Sodium: 138 mmol/L (ref 135–145)
Total Bilirubin: 0.9 mg/dL (ref 0.3–1.2)
Total Protein: 6.6 g/dL (ref 6.5–8.1)

## 2021-04-18 LAB — URINALYSIS, ROUTINE W REFLEX MICROSCOPIC
Bilirubin Urine: NEGATIVE
Glucose, UA: NEGATIVE mg/dL
Hgb urine dipstick: NEGATIVE
Ketones, ur: NEGATIVE mg/dL
Leukocytes,Ua: NEGATIVE
Nitrite: NEGATIVE
Protein, ur: NEGATIVE mg/dL
Specific Gravity, Urine: 1.016 (ref 1.005–1.030)
pH: 7 (ref 5.0–8.0)

## 2021-04-18 LAB — HIV ANTIBODY (ROUTINE TESTING W REFLEX): HIV Screen 4th Generation wRfx: NONREACTIVE

## 2021-04-18 LAB — PROTIME-INR
INR: 1 (ref 0.8–1.2)
Prothrombin Time: 13.1 seconds (ref 11.4–15.2)

## 2021-04-18 LAB — HEMOGLOBIN A1C
Hgb A1c MFr Bld: 6.2 % — ABNORMAL HIGH (ref 4.8–5.6)
Mean Plasma Glucose: 131.24 mg/dL

## 2021-04-18 MED ORDER — ATORVASTATIN CALCIUM 80 MG PO TABS
80.0000 mg | ORAL_TABLET | Freq: Every day | ORAL | Status: DC
  Administered 2021-04-19 – 2021-05-04 (×16): 80 mg via ORAL
  Filled 2021-04-18 (×16): qty 1

## 2021-04-18 MED ORDER — ENOXAPARIN SODIUM 40 MG/0.4ML IJ SOSY
40.0000 mg | PREFILLED_SYRINGE | INTRAMUSCULAR | Status: DC
  Administered 2021-04-19: 40 mg via SUBCUTANEOUS
  Filled 2021-04-18 (×2): qty 0.4

## 2021-04-18 MED ORDER — CLOPIDOGREL BISULFATE 75 MG PO TABS
75.0000 mg | ORAL_TABLET | Freq: Every day | ORAL | Status: DC
  Administered 2021-04-19 – 2021-05-04 (×16): 75 mg via ORAL
  Filled 2021-04-18 (×16): qty 1

## 2021-04-18 MED ORDER — ACETAMINOPHEN 650 MG RE SUPP: 650.0000 mg | RECTAL | Status: DC | PRN

## 2021-04-18 MED ORDER — ACETAMINOPHEN 325 MG PO TABS
650.0000 mg | ORAL_TABLET | ORAL | Status: DC | PRN
  Administered 2021-04-18 – 2021-05-01 (×4): 650 mg via ORAL
  Filled 2021-04-18 (×7): qty 2

## 2021-04-18 MED ORDER — IOHEXOL 350 MG/ML SOLN
50.0000 mL | Freq: Once | INTRAVENOUS | Status: AC | PRN
  Administered 2021-04-18: 50 mL via INTRAVENOUS

## 2021-04-18 MED ORDER — ACETAMINOPHEN 160 MG/5ML PO SOLN: 650.0000 mg | ORAL | Status: DC | PRN

## 2021-04-18 MED ORDER — STROKE: EARLY STAGES OF RECOVERY BOOK
Freq: Once | Status: AC
  Filled 2021-04-18 (×2): qty 1

## 2021-04-18 MED ORDER — SENNOSIDES-DOCUSATE SODIUM 8.6-50 MG PO TABS: 1.0000 | ORAL_TABLET | Freq: Every evening | ORAL | Status: DC | PRN

## 2021-04-18 MED ORDER — ASPIRIN EC 81 MG PO TBEC
81.0000 mg | DELAYED_RELEASE_TABLET | Freq: Every day | ORAL | Status: DC
  Administered 2021-04-19 – 2021-05-04 (×16): 81 mg via ORAL
  Filled 2021-04-18 (×16): qty 1

## 2021-04-18 NOTE — ED Notes (Signed)
Pt transported to MRI 

## 2021-04-18 NOTE — ED Notes (Signed)
Call to [facility] 

## 2021-04-18 NOTE — ED Notes (Signed)
Spoke with admitting and they state that they are allowing for hypertension (permissive hypertension) and will enter parameters.

## 2021-04-18 NOTE — ED Notes (Signed)
After being on hold to [facility] I am advised that they will still not be able to take report as the charge RN has still not approved the bed.

## 2021-04-18 NOTE — ED Notes (Signed)
Pt was given meal after he passed the swallow screen.  Admitting MD paged to notify regarding elevated BP

## 2021-04-18 NOTE — Hospital Course (Addendum)
Marcus [name] is a 70 year old person multiple arterial clots history including 3 myocardial infarctions, stroke in 2017, an PAD who is admitted for two small acute cerebral infarctions at the right pons and left occipital lobe.  #Acute ischemic strokes of R pons and L occipital lobe Presented with acute left sided weakness and numbness which evolved into warm/tingling paresthesias. 04/18/21 MRI revealed small acute infarcts of R pons and L occipital lobe in addition to chronic small vessel infarcts. Patient was out of the window for tPA. 04/18/21 CTA Head and Neck showing 60% stenosis of R carotid bulb and focal high grade stenosis of proximal L inferior M2, also R PCA with mild stenosis. Pt with 45+ pack year history and not regularly adherent to risk reducing medications including Plavix, ASA, and Lipitor. LDL at 140 and A1C at 6.2. Permissive HTN allowed for first 48 hours and home 81mg  ASA, 75mg  Plavix, and 80mg  Lipitor were all restarted. Echo on 8/11 showed EF of 55-60% with no intracardiac source of embolism detected. Neurology recommended DAPT with 75mg  Plavix and 81mg  ASA for 3 weeks, followed by 81mg  ASA. Plavix therapy will be completed on 9/1. Patient also recommended to have a 30 day cardiac event monitor upon discharge to rule out Afib. Patient stable to be discharged to SNF. Patient will follow up with Neurology and Cardiology.  #Depression with suicidal thoughts Patient with history of depression, and acutely worsened after this most recent stroke. Describes "not wanting to be a burden," to his son and family who he has been living with. No active plan for suicide. Was evaluated by Pscyh and increased to 20mg  Celexa and started on 100mg  Trazodone nightly. Will be discharged with these medications and will require outpatient follow up with PCP.   #Gout in left great toe Patient has had gout flares in the past and feels that his symptoms are similar to what he has experienced. Reports  improvement with colchicine and his pain is well controlled with current regimen. Colchicine therapy finished on the day of discharge. Patient will start on Allopurinol for prevention of future gout flares. We will start this medication at discharge with 5 day course of steroids to prevent acute gout flare upon starting allopurinol. Start with dose 100mg  daily, will need dose titration by 100 mg every two to four weeks to reach and maintain the urate-lowering goal range <6 mg/dL   #Hypertension Restarted on home lisinopril 40mg  after stroke. Home dose of metoprolol discontinued on 8/21 due to bradycardia in the 50s. Will be discharged to SNF on all of his home meds.   #Left heel pain 2/2 stage p1 pressure injury vs acute gout of heel  Patient started to have left heel pain on 8/13, thought to be secondary to stage 1 pressure wound. 8/15 XR of L foot/ankle was negative for any bony or soft tissue abnormality. Pain improved with medical management and with propping foot up on soft pillow. Continued on home dose of Norco and gabapentin for foot pain. Started on short course of steroids as pain could also be 2/2 to gout flare.  #B12 deficiency 04/18/21 B12 low at 176 and patient was given B12 IM. Patient was continued on home vitamin D supplement and no further medical management requirement.

## 2021-04-18 NOTE — ED Triage Notes (Signed)
Pt BIB [facility] from home c/o left sided CP and  left sided numbness. Pt states he woke up at 5am like this. Pt unsure of LWK. Pt has hx of stents, CVAs. Pt does have a distended, tight belly but denies any abdominal pain. BP on scene 138/82 and HR of 64. Pt given 324 ASA but denied nitro. Pt states his pain is 8/10.

## 2021-04-18 NOTE — ED Provider Notes (Signed)
[facility] EMERGENCY DEPARTMENT Provider Note   CSN: [id number] Arrival date & time: 04/18/21  1132     History No chief complaint on file.   Marcus [name] is a 70 y.o. male.  Patient states that he felt normal he went to bed last night but then when he woke up to go to the bathroom he was unable to walk appropriately.  He was able to get out of bathroom into a chair that sit.  Feels like he lost significant coordination in his left leg.  Patient also complains of numbness to left arm  The history is provided by the patient and medical records. No language interpreter was used.  Weakness Severity:  Moderate Onset quality:  Sudden Duration:  12 hours Timing:  Constant Progression:  Waxing and waning Chronicity:  New Context: not alcohol use   Relieved by:  Nothing Worsened by:  Nothing Ineffective treatments:  None tried Associated symptoms: no abdominal pain, no chest pain, no cough, no diarrhea, no frequency, no headaches and no seizures       Past Medical History:  Diagnosis Date   Arthritis    Cervical spine disease    CHF (congestive heart failure) (HCC)    Depression    GERD (gastroesophageal reflux disease)    Headache    migraines in the "80's"   Hyperlipidemia    Hypertension    Myocardial infarction [facility])    Peripheral vascular disease (HCC)    Stroke (HCC)    x 2    Patient Active Problem List   Diagnosis Date Noted   Critical lower limb ischemia (HCC) 05/25/2020   Acute coronary syndrome (HCC) 12/21/2019   PAD (peripheral artery disease) (HCC) 12/07/2019   Peripheral vascular disease (HCC) 12/07/2019   Chest pain 09/26/2017   Cerebrovascular accident (CVA) (HCC)    Acute combined systolic and diastolic CHF, NYHA class 3 (HCC) 07/16/2016   Acute cerebral infarction (HCC) 07/16/2016   Benign essential HTN 07/16/2016   HLD (hyperlipidemia) 07/16/2016   Non compliance w medication regimen 07/16/2016   CKD (chronic kidney  disease) stage 3, GFR 30-59 ml/min (HCC) 07/16/2016   CVA (cerebral vascular accident) (HCC) 07/16/2016   Chest pain at rest 07/06/2014   Hypertension 03/11/2011   CAD (coronary artery disease) 03/11/2011    Past Surgical History:  Procedure Laterality Date   ABDOMINAL AORTOGRAM W/LOWER EXTREMITY Bilateral 09/15/2018   Procedure: ABDOMINAL AORTOGRAM W/LOWER EXTREMITY;  Surgeon: Nada Libman, MD;  Location: [facility] INVASIVE CV LAB;  Service: Cardiovascular;  Laterality: Bilateral;   ABDOMINAL AORTOGRAM W/LOWER EXTREMITY N/A 11/26/2019   Procedure: ABDOMINAL AORTOGRAM W/LOWER EXTREMITY;  Surgeon: Sherren Kerns, MD;  Location: [facility] INVASIVE CV LAB;  Service: Cardiovascular;  Laterality: N/A;   breast lump removal     BYPASS GRAFT  12/07/2019   RIGHT FEMORAL-ABOVE KNEE POPLITEAL ARTERY BYPASS GRAFT USING PROPATEN GRAFT (Right Leg Upper   CORONARY ARTERY BYPASS GRAFT     FEMORAL-POPLITEAL BYPASS GRAFT Right 12/07/2019   Procedure: RIGHT FEMORAL-ABOVE KNEE POPLITEAL ARTERY BYPASS GRAFT USING PROPATEN GRAFT;  Surgeon: Sherren Kerns, MD;  Location: [facility] OR;  Service: Vascular;  Laterality: Right;   INTRAOPERATIVE ARTERIOGRAM Right 12/07/2019   Procedure: Intra Operative Arteriogram;  Surgeon: Sherren Kerns, MD;  Location: [facility] OR;  Service: Vascular;  Laterality: Right;   LEFT HEART CATHETERIZATION WITH CORONARY/GRAFT ANGIOGRAM Right 05/01/2012   Procedure: LEFT HEART CATHETERIZATION WITH Isabel Caprice;  Surgeon: Ricki Rodriguez, MD;  Location: [facility] CATH  LAB;  Service: Cardiovascular;  Laterality: Right;   LOWER EXTREMITY ANGIOGRAPHY Right 05/25/2020   Procedure: LOWER EXTREMITY ANGIOGRAPHY;  Surgeon: Cephus Shelling, MD;  Location: [facility] INVASIVE CV LAB;  Service: Cardiovascular;  Laterality: Right;   LOWER EXTREMITY ANGIOGRAPHY  05/26/2020   Procedure: Lower Extremity Angiography;  Surgeon: Maeola Harman, MD;  Location: [facility] INVASIVE CV LAB;  Service: Cardiovascular;;    PERIPHERAL VASCULAR INTERVENTION Right 05/26/2020   Procedure: PERIPHERAL VASCULAR INTERVENTION;  Surgeon: Maeola Harman, MD;  Location: [facility] INVASIVE CV LAB;  Service: Cardiovascular;  Laterality: Right;  PERONEAL FEM-POP BYPASS GRAFT   triple bypass         Family History  Problem Relation Age of Onset   Kidney disease Mother    Asthma Mother    Hypertension Father     Social History   Tobacco Use   Smoking status: Former    Years: 45.00    Types: Cigarettes   Smokeless tobacco: Never  Vaping Use   Vaping Use: Never used  Substance Use Topics   Alcohol use: Yes    Comment: occ   Drug use: Yes    Types: Marijuana    Comment: last time 12/05/2019    Home Medications Prior to Admission medications   Medication Sig Start Date End Date Taking? Authorizing Provider  cholecalciferol (VITAMIN D3) 25 MCG (1000 UNIT) tablet Take 1,000 Units by mouth daily.   Yes [provider]  lisinopril (ZESTRIL) 40 MG tablet Take 40 mg by mouth daily. 04/09/21  Yes [provider]  metoprolol tartrate (LOPRESSOR) 50 MG tablet Take 50 mg by mouth 2 (two) times daily. 11/30/20  Yes [provider]  omeprazole (PRILOSEC) 20 MG capsule Take 20 mg by mouth daily.   Yes [provider]  aspirin EC 81 MG EC tablet Take 1 tablet (81 mg total) by mouth daily at 6 (six) AM. Patient not taking: Reported on 04/18/2021 12/10/19   Dara Lords, PA-C  atorvastatin (LIPITOR) 80 MG tablet Take 80 mg by mouth daily. 11/30/20   [provider]  Cholecalciferol (VITAMIN D-3) 125 MCG (5000 UT) TABS Take 5,000 Units by mouth daily. Patient not taking: Reported on 04/18/2021 12/22/19   Orpah Cobb, MD  clopidogrel (PLAVIX) 75 MG tablet Take 1 tablet (75 mg total) by mouth daily with breakfast. Patient not taking: No sig reported 05/27/20   Dara Lords, PA-C  furosemide (LASIX) 40 MG tablet Take 40 mg by mouth daily. 02/06/21   [provider]   gabapentin (NEURONTIN) 300 MG capsule Take 300 mg by mouth 3 (three) times daily.    [provider]  HYDROcodone-acetaminophen (NORCO) 10-325 MG tablet Take 1 tablet by mouth every 6 (six) hours as needed for moderate pain.  Patient not taking: Reported on 04/18/2021 02/02/20   [provider]  isosorbide mononitrate (IMDUR) 30 MG 24 hr tablet Take 0.5 tablets (15 mg total) by mouth daily. Patient not taking: Reported on 04/18/2021 12/23/19   Orpah Cobb, MD  nitroGLYCERIN (NITROLINGUAL) 0.4 MG/SPRAY spray Place 1 spray under the tongue every 5 (five) minutes as needed for chest pain. Patient not taking: Reported on 04/18/2021 12/22/19   Orpah Cobb, MD  pantoprazole (PROTONIX) 40 MG tablet Take 1 tablet (40 mg total) by mouth daily. Patient not taking: Reported on 04/18/2021 12/10/19   Sherren Kerns, MD    Allergies    Other and Penicillins  Review of Systems   Review of Systems  Constitutional:  Negative for appetite change and fatigue.  HENT:  Negative for congestion, ear discharge and sinus pressure.   Eyes:  Negative for discharge.  Respiratory:  Negative for cough.   Cardiovascular:  Negative for chest pain.  Gastrointestinal:  Negative for abdominal pain and diarrhea.  Genitourinary:  Negative for frequency and hematuria.  Musculoskeletal:  Negative for back pain.  Skin:  Negative for rash.  Neurological:  Positive for weakness. Negative for seizures and headaches.       Poor coordination of the left arm and leg  Psychiatric/Behavioral:  Negative for hallucinations.    Physical Exam Updated Vital Signs BP (!) 197/89 (BP Location: Right Arm)   Pulse (!) 57   Temp 97.7 F (36.5 C) (Oral)   Resp 13   Ht 5\' 7"  (1.702 m)   Wt 81.6 kg   SpO2 99%   BMI 28.19 kg/m   Physical Exam Constitutional:      Appearance: He is well-developed.  HENT:     Head: Normocephalic.     Nose: Nose normal.  Eyes:     General: No scleral icterus.     Conjunctiva/sclera: Conjunctivae normal.  Neck:     Thyroid: No thyromegaly.  Cardiovascular:     Rate and Rhythm: Normal rate and regular rhythm.     Heart sounds: No murmur heard.   No friction rub. No gallop.  Pulmonary:     Breath sounds: No stridor. No wheezing or rales.  Chest:     Chest wall: No tenderness.  Abdominal:     General: There is no distension.     Tenderness: There is no abdominal tenderness. There is no rebound.  Musculoskeletal:        General: Normal range of motion.     Cervical back: Neck supple.  Lymphadenopathy:     Cervical: No cervical adenopathy.  Skin:    Findings: No erythema or rash.  Neurological:     Mental Status: He is alert and oriented to person, place, and time.     Motor: No abnormal muscle tone.     Coordination: Coordination normal.     Comments: Poor coordination left leg with numbness in left arm  Psychiatric:        Behavior: Behavior normal.    ED Results / Procedures / Treatments   Labs (all labs ordered are listed, but only abnormal results are displayed) Labs Reviewed  COMPREHENSIVE METABOLIC PANEL - Abnormal; Notable for the following components:      Result Value   Creatinine, Ser 1.48 (*)    GFR, Estimated 51 (*)    All other components within normal limits  CBC WITH DIFFERENTIAL/PLATELET  TROPONIN I (HIGH SENSITIVITY)  TROPONIN I (HIGH SENSITIVITY)    EKG EKG Interpretation  Date/Time:  Wednesday April 18 2021 11:38:03 EDT Ventricular Rate:  62 PR Interval:  150 QRS Duration: 118 QT Interval:  454 QTC Calculation: 462 R Axis:   26 Text Interpretation: Sinus rhythm Left ventricular hypertrophy Inferior infarct, age indeterminate Lateral leads are also involved Confirmed by Bethann BerkshireZammit, Yanil Dawe [phone]([id number]) on 04/18/2021 12:02:02 PM  Radiology MR BRAIN WO CONTRAST  Result Date: 04/18/2021 CLINICAL DATA:  Dizziness, nonspecific; left-sided numbness EXAM: MRI HEAD WITHOUT CONTRAST TECHNIQUE: Multiplanar, multiecho pulse  sequences of the brain and surrounding structures were obtained without intravenous contrast. COMPARISON:  November 2017 FINDINGS: Brain: Foci of mildly reduced diffusion are present at the right lateral aspect of the pons and parasagittal left occipital lobe. There are multiple chronic small  vessel infarcts including involvement of the corona radiata, ventral corpus callosum, basal ganglia and thalami, pons, and cerebellum. Additional patchy foci of T2 hyperintensity in the supratentorial white matter are nonspecific but may reflect minor chronic microvascular ischemic changes. Ventricles and sulci are within normal limits in size and configuration. Foci of susceptibility reflecting chronic microhemorrhage are present in the left thalamus. There is no intracranial mass or mass effect. There is no hydrocephalus or extra-axial fluid collection. Vascular: Major vessel flow voids at the skull base are preserved. Skull and upper cervical spine: Normal marrow signal is preserved. Sinuses/Orbits: Paranasal sinuses are aerated. Orbits are unremarkable. Other: Sella is unremarkable.  Mastoid air cells are clear. IMPRESSION: Small acute infarcts of the right pons and left occipital lobe. Several chronic small vessel infarcts. Left thalamic chronic microhemorrhages. Electronically Signed   By: Guadlupe Spanish M.D.   On: 04/18/2021 13:58   DG Chest Port 1 View  Result Date: 04/18/2021 CLINICAL DATA:  LEFT-sided chest pain EXAM: PORTABLE CHEST 1 VIEW COMPARISON:  12/21/2019 FINDINGS: Sternotomy wires overlie normal cardiac silhouette. Normal pulmonary vasculature. No effusion, infiltrate, or pneumothorax. No acute osseous abnormality. IMPRESSION: No acute cardiopulmonary process. Electronically Signed   By: Genevive Bi M.D.   On: 04/18/2021 13:20    Procedures Procedures   Medications Ordered in ED Medications - No data to display  ED Course  I have reviewed the triage vital signs and the nursing  notes.  Pertinent labs & imaging results that were available during my care of the patient were reviewed by me and considered in my medical decision making (see chart for details). CRITICAL CARE Performed by: Bethann Berkshire Total critical care time: 40 minutes Critical care time was exclusive of separately billable procedures and treating other patients. Critical care was necessary to treat or prevent imminent or life-threatening deterioration. Critical care was time spent personally by me on the following activities: development of treatment plan with patient and/or surrogate as well as nursing, discussions with consultants, evaluation of patient's response to treatment, examination of patient, obtaining history from patient or surrogate, ordering and performing treatments and interventions, ordering and review of laboratory studies, ordering and review of radiographic studies, pulse oximetry and re-evaluation of patient's condition.    MDM Rules/Calculators/A&P                           MRI shows small stroke.  Neurology will consult with medicine admission Final Clinical Impression(s) / ED Diagnoses Final diagnoses:  None    Rx / DC Orders ED Discharge Orders     None        Bethann Berkshire, MD 04/18/21 1450

## 2021-04-18 NOTE — ED Notes (Signed)
Report was given to 3W 

## 2021-04-18 NOTE — ED Notes (Signed)
Paged second contact at this time

## 2021-04-18 NOTE — ED Notes (Signed)
Charge RN on 3W states that bed is not approved by her as covid swab has not been obtained.  Placed order and will swab pt

## 2021-04-18 NOTE — ED Notes (Signed)
Call to 3W to give report, 3W RN to call back to take report.

## 2021-04-18 NOTE — ED Notes (Signed)
Call back to 3W to give report   

## 2021-04-18 NOTE — H&P (Addendum)
Date: 04/18/2021               Patient Name:  Marcus [name] MRN: [id number]  DOB: [birth year] Age / Sex: 70 y.o., male   PCP: Pcp, No              Medical Service: Internal Medicine Teaching Service              Attending Physician: Dr. Erlinda Hong    First Contact: Marcelline Mates, MS 3 Pager: [phone]  Second Contact: Dr. Elza Rafter Pager: [phone]  Third Contact Dr. Eliezer Bottom Pager: [phone]       After Hours (After 5p/  First Contact Pager: [phone]  weekends / holidays): Second Contact Pager: [phone]   Chief Complaint: Left sided weakness and numbness  History of Present Illness:   Marcus [name] is a 70 year old person with HTN, HLD, multiple MI with CABG, prior stroke in 2017, CHF, peripheral vascular disease and CKD who presents with acute left sided weakness and left sided numbness.  Marcus Torosyan notes that he was in his usual state of health until he went to the bathroom this morning around 7am. Upon reaching the bathroom, he fell due to left sided weakness but did not lose consciousness and did not hurt himself. He was able to get off the floor, urinated, and went back to the recliner where he was sleeping. He then woke up roughly 40 minutes later needing to urinate again (endorses baseline urinary urgency and incomplete voiding) and again fell without injuring himself, due to left sided weakness. He eventually got to the bathroom and sat there until his son's wife found him and called EMS.   The pt also notes that he felt numbness in his left extremities which has slowly turned into sharp/cold paresthesias. He notes that his left sided strength has returned throughout the day, both in his left leg and left arm.   Otherwise, Marcus [name] is feeling like himself and denies any other concerns. He notes that he often doesn't take the medications he has been prescribed to lower his risk of strokes and heart attacks, as noted directly below in more detail.  Meds:  Takes  regularly:  1000 unit Cholecalciferol 20mg  omeprazole 40mg  Lisinopril 10-325 Hydrocodone Acetaminophen  Meds he takes occasionally, but not often: 75mg  Plavix 80mg  Lipitor 50mg  Lopressor BID 40mg  lasix 30mg  imdur 81mg  aspirin  He was prescribed 300mg  Gabapentin TID two days ago and has not yet picked this up  Allergies: Penicillin allergy with hives. No difficulty breathing  Past Medical History:  Diagnosis Date   Arthritis    Cervical spine disease    CHF (congestive heart failure) (HCC)    Depression    GERD (gastroesophageal reflux disease)    Headache    migraines in the "80's"   Hyperlipidemia    Hypertension    Myocardial infarction [facility])    Peripheral vascular disease (HCC)    Stroke (HCC)    x 2    Family History:  Mother with CKD and asthma, died 3 years ago Father with HTN and stroke, died 5 years ago. Has 10 siblings, and denies strokes or heart attacks.  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 He last worked at as recently as 1.5 years ago. He lives at his son's home with his son, his son's wife, and their two sons. He smoked 1-1.5 packs of  cigarettes daily for 45 years, and his last cigarette was 10+ yrs ago He consumed beer or liquor drinks occasionally until his wife died of cancer two years ago, and has since lost the taste for alcohol. He smokes marijuana occasionally and used cocaine occasionally until one year ago, but denies any other illicit drug use. He is completes all ADLs and IADLs including cooking, shopping and paying bills. He has always been able to afford his medications He would appreciate his son, Marshawn being involved in his care.  Review of Systems: A complete ROS was negative except as per HPI.  Physical Exam: Blood pressure 120/90, pulse (!) 59, temperature 97.7 F (36.5 C), temperature source Oral, resp. rate 14, height  5\' 7"  (1.702 m), weight 81.6 kg, SpO2 99 %. General: Pleasant, gentleman laying comfortable in bed. No acute distress. Head: Normocephalic. Atraumatic. CV: RRR. No murmurs, rubs, or gallops. 1+ bilateral ankle edema Pulmonary: Lungs CTAB. Normal effort. No wheezing or rales. Abdominal: Soft, nontender, nondistended. Normal bowel sounds. Extremities: Palpable radial pulses. 1+ dorsalis pedis pulses bilaterally. Both feet cool to touch. Normal ROM. Skin: Warm and dry. No obvious rash or lesions. Neuro: A&Ox3. Paresthesias (pins and needles reported) on left upper and lower extremities. Strength equal in bilateral extremities. Finger abduction 3/5 on Left. Decreased sensation on left lateral thigh. CN I-XII intact. L and R heel to shin normal, and finger to nose bilaterally. Pt is left handed Psych: Normal mood and affect   EKG: personally reviewed my interpretation is normal sinus rhythm at 60 bpm. No ischemic changes.  CXR: personally reviewed my interpretation is borderline enlarged cardiac silhouette without lung opacities or silhouette signs.  Assessment & Plan by Problem:  Acute ischemic infarcts of right pons and left occipital lobe Pt presented with acute LLE and LUE weakness and numbness which has improved throughout the day. 04/18/21 MRI Brain revealed small acute infarcts of the R pons and L occipital lobe in addition to chronic small vessel infarcts. Out of the window for tPA. 04/18/21 CTA Head and Neck showing 60% stenosis of R carotid bulb and focal high grade stenosis of proximal L inferior M2, also R PCA with mild stenosis. Pt with history of 45+ pack year history, HTN, three prior myocardial infarcations, PAD, and a prior stroke in 2017 and not regularly adherent to risk reducing medications including Plavix, Aspirin, and Lipitor. Pt denies difficulty affording medications, and intends to begin taking them after this stroke. Stroke team following this pt, and will follow their  recs. -INR, aPTT, Hgb A1c, lipid panel, TSH and B12 level pending -Permissive HTN for first 24-48 hours -Echo pending -81mg  ASA, 75mg  Plavix, 80mg  Lipitor -PT/OT/SLP eval and treat -Cardiac telemetry monitoring -Frequent neuro checks  Incomplete urinary voiding Reports of several months of urinary urgency and incomplete voiding suspicious for BPH. No dysuria or abnormal color. Has not followed with a urologist in the past.  -Will check UA to r/o infection.   DVT prophylaxis: Lovenox  Diet: NPO pending SLP eval Fluids: None Code: DNR Dispo: Admit patient to Observation with expected length of stay less than 2 midnights.    Signed: 2018, Medical Student 04/18/2021, 3:34 PM  Pager: [phone]  Attestation for Student Documentation:  I personally was present and performed or re-performed the history, physical exam and medical decision-making activities of this service and have verified that the service and findings are accurately documented in the student's note.  , DO 04/18/2021, 5:37 PM

## 2021-04-18 NOTE — Consult Note (Signed)
Neurology Consultation  Reason for Consult: stroke on MRI brain.  Referring Physician: Clelia Schaumann, MD.   CC: numbness and tingling on left side, weaker on left side, and ataxic gait this a.m.   History is obtained from: patient.   HPI: Marcus [name] is a 70 y.o. male with a PMHx of GERD, depression, CHF, HLD, HTN, CKD III, severe PAD s/p Fem/Pop bypass 2021, CAD s/p MI in 3/22 and CABG, PCI to right coronary artery 3/22, ankylosing spondylitis of spine, and subacute left pontine infarct in 2017. Remote  strokes in right thalamus, left pontine, and central pontine lacunar. In 2017, he was discharged on Plavix and ASA. He is supposed to be on Plavix for his PAD and coronary stents, but is not taking it. He admits he is no adherent to his medications and just chooses not to take them sometimes. States his BP med was supposed to be BID, but he was only taking it qd.   He lives with his son and daughter n law in [place], [place].    This a.m., patient woke up and had difficulty walking. States his legs were wobbly and he had to use his cane. When he arrived in bathroom, he fell backwards onto the commode. He urinated, but sat on commode for 30 mins before he could get up and walk. He used his cane and made it to the living room. Daughter n law called 911. His son had already left for work.   He reports diplopia 2 weeks ago which spontaneously resolved. Had blurred vision yesterday which has dissipated. Eyes checked last year without change in Rx.   He normally has some fatigue to his BLEs due to his past vascular surgeries (bypass, angioplasty). He uses a cane and does not climb stairs. His 2017 stroke left him with memory issues and difficulty getting his words out at times. NP does not find any out patient neurology f/up notes in Epic.   In care everywhere, he saw an orthopaedic MD yesterday at [facility] for lumbar spondylosis with chronic pain. Norco 10/325 x 1 month was prescribed, but only one month,  as his first UDS was "inconsistent with human urine". He repeated UDS, but results not on chart yet. Her was slated for MRI Lspine. BP there 190/97.  He had a nerve block in 2021.   Neurology asked to consult due to findings of stroke on MRI brain.   ROS: A robust ROS was performed and is negative except as noted in the HPI.   Past Medical History:  Diagnosis Date   Arthritis    Cervical spine disease    CHF (congestive heart failure) (HCC)    Depression    GERD (gastroesophageal reflux disease)    Headache    migraines in the "80's"   Hyperlipidemia    Hypertension    Myocardial infarction (HCC)    Peripheral vascular disease (HCC)    Stroke (HCC)    x 2  CKD III  Family History  Problem Relation Age of Onset   Kidney disease Mother    Asthma Mother    Hypertension Father     Social History:  Prior tobacco abuse. ETOH use maybe once a week. Marijuana use every other day.   Medications No current facility-administered medications for this encounter.  Current Outpatient Medications:    cholecalciferol (VITAMIN D3) 25 MCG (1000 UNIT) tablet, Take 1,000 Units by mouth daily., Disp: , Rfl:    lisinopril (ZESTRIL) 40 MG tablet, Take  40 mg by mouth daily., Disp: , Rfl:    metoprolol tartrate (LOPRESSOR) 50 MG tablet, Take 50 mg by mouth 2 (two) times daily., Disp: , Rfl:    omeprazole (PRILOSEC) 20 MG capsule, Take 20 mg by mouth daily., Disp: , Rfl:    aspirin EC 81 MG EC tablet, Take 1 tablet (81 mg total) by mouth daily at 6 (six) AM. (Patient not taking: Reported on 04/18/2021), Disp: 30 tablet, Rfl: 11   atorvastatin (LIPITOR) 80 MG tablet, Take 80 mg by mouth daily., Disp: , Rfl:    Cholecalciferol (VITAMIN D-3) 125 MCG (5000 UT) TABS, Take 5,000 Units by mouth daily. (Patient not taking: Reported on 04/18/2021), Disp: , Rfl:    clopidogrel (PLAVIX) 75 MG tablet, Take 1 tablet (75 mg total) by mouth daily with breakfast. (Patient not taking: No sig reported), Disp: 30  tablet, Rfl: 11   furosemide (LASIX) 40 MG tablet, Take 40 mg by mouth daily., Disp: , Rfl:    gabapentin (NEURONTIN) 300 MG capsule, Take 300 mg by mouth 3 (three) times daily., Disp: , Rfl:    HYDROcodone-acetaminophen (NORCO) 10-325 MG tablet, Take 1 tablet by mouth every 6 (six) hours as needed for moderate pain.  (Patient not taking: Reported on 04/18/2021), Disp: , Rfl:    isosorbide mononitrate (IMDUR) 30 MG 24 hr tablet, Take 0.5 tablets (15 mg total) by mouth daily. (Patient not taking: Reported on 04/18/2021), Disp: 15 tablet, Rfl: 2   nitroGLYCERIN (NITROLINGUAL) 0.4 MG/SPRAY spray, Place 1 spray under the tongue every 5 (five) minutes as needed for chest pain. (Patient not taking: Reported on 04/18/2021), Disp: 12 g, Rfl: 1   pantoprazole (PROTONIX) 40 MG tablet, Take 1 tablet (40 mg total) by mouth daily. (Patient not taking: Reported on 04/18/2021), Disp: 30 tablet, Rfl: 11   Exam: Current vital signs: BP (!) 197/89 (BP Location: Right Arm)   Pulse (!) 57   Temp 97.7 F (36.5 C) (Oral)   Resp 13   Ht 5\' 7"  (1.702 m)   Wt 81.6 kg   SpO2 99%   BMI 28.19 kg/m  Vital signs in last 24 hours: Temp:  [97.7 F (36.5 C)] 97.7 F (36.5 C) (08/10 1138) Pulse Rate:  [55-62] 57 (08/10 1422) Resp:  [13-24] 13 (08/10 1422) BP: (173-197)/(63-123) 197/89 (08/10 1422) SpO2:  [98 %-100 %] 99 % (08/10 1422) Weight:  [81.6 kg] 81.6 kg (08/10 1139)  PE: GENERAL: Chronically ill appearing male who appears older than his stated age. Awake, alert in NAD.  HEENT: normocephalic and atraumatic. Missing some teeth.  LUNGS - Normal respiratory effort.  CV - RRR on tele. ABDOMEN - Soft, nontender. Ext: warm, well perfused. Psych: affect light.   NEURO:  Mental Status: Alert and oriented to self, age, hospital, date, day, year. [place]  Speech/Language: speech is without dysarthria. He hesitates on some answers like he is searching. No receptive aphasia.  Naming, repetition,  fluency, and comprehension intact.  Cranial Nerves:  II: PERRL. visual fields full.  III, IV, VI: EOMI. Lid elevation symmetric and full.  V: sensation is intact and symmetrical to face.  VII: Smile is symmetrical.  VIII:hearing intact to voice. IX, X: palate elevation is symmetric. Phonation is hoarse.  XI: normal sternocleidomastoid strength. Trapezius 4+/5 on the left.  ZOX:WRUEAVXII:tongue is symmetrical without fasciculations.   Motor:  RUE: grips  5/5       triceps 5/5     biceps  5/5  LUE: grips  5/5      triceps  4+/5      biceps  4+/5 RLE:  knee 5/5    thigh  5/5      plantar flexion  5/5     dorsiflexion  5/5 LLE:  knee 5/5   thigh  4/5     plantar flexion  4/5   dorsiflexion   4/5 Tone is normal. Bulk is normal.  Sensation- Intact to light touch bilaterally in all four extremities. Sensation to light touch is less in the LUE and LLE. Extinction absent to DSS.  Coordination: FTN intact bilaterally but ataxic on the LUE. HKS intact bilaterally but can not perform up to knee with LLE. Pronator drift noted to LLE.  DTRs:  RUE:  biceps 2+     brachioradialis 2+ RLE:  patella 1+     tibial 2+ LUE:  biceps 2+    brachioradialis  2+ LLE: patella 1+    tibial 1+ Gait- deferred.  NIHSS:  1a Level of Consciousness: 0 1b LOC Questions: 0 1c LOC Commands: 0 2 Best Gaze: 0 3 Visual: 0 4 Facial Palsy: 0 5a Motor Arm - left: 0 5b Motor Arm - Right: 0 6a Motor Leg - Left: 1 6b Motor Leg - Right: 0 7 Limb Ataxia: 1 8 Sensory: 1 9 Best Language: 0 10 Dysarthria: 0 11 Extinction and Inattention: 0 TOTAL: 3  Labs I have reviewed labs in epic and the results pertinent to this consultation are:  CBC    Component Value Date/Time   WBC 5.2 04/18/2021 1221   RBC 5.30 04/18/2021 1221   HGB 14.3 04/18/2021 1221   HCT 45.8 04/18/2021 1221   PLT 157 04/18/2021 1221   MCV 86.4 04/18/2021 1221   MCH 27.0 04/18/2021 1221   MCHC 31.2 04/18/2021 1221   RDW 14.6 04/18/2021 1221    LYMPHSABS 1.6 04/18/2021 1221   MONOABS 0.5 04/18/2021 1221   EOSABS 0.0 04/18/2021 1221   BASOSABS 0.0 04/18/2021 1221    CMP     Component Value Date/Time   NA 138 04/18/2021 1221   K 4.3 04/18/2021 1221   CL 105 04/18/2021 1221   CO2 26 04/18/2021 1221   GLUCOSE 93 04/18/2021 1221   BUN 9 04/18/2021 1221   CREATININE 1.48 (H) 04/18/2021 1221   CALCIUM 9.3 04/18/2021 1221   PROT 6.6 04/18/2021 1221   ALBUMIN 3.7 04/18/2021 1221   AST 26 04/18/2021 1221   ALT 19 04/18/2021 1221   ALKPHOS 111 04/18/2021 1221   BILITOT 0.9 04/18/2021 1221   GFRNONAA 51 (L) 04/18/2021 1221   GFRAA 60 (L) 05/27/2020 0135    Imaging MD reviewed the images obtained.  CTA head and neck pending.   MRI brain Small acute infarcts of the right pons and left occipital lobe.  Several chronic small vessel infarcts. Left thalamic chronic microhemorrhages.   Assessment: 70 yo male with multiple stroke risk factors, including HLD, HTN, prior strokes, history of tobacco and marijuana abuse, medication non adherence, CAD, and PAD. He has a history of prior strokes of subacute left pontine, chronic central pontine lacunar infarction, and chronic right thalamic infarction. The right pons infarct would account for his gait disturbance and the numbness, tingling, and weakness of the left side.  Impression: -Small acute infarcts in right pons and left occipital lobe. OSW for tPA. Etiology likely small vessel disease -Chronic medication non adherence as per chart notes found on visits to other providers -marijuana  abuse.  -history of failed UDS.   Recommendations/Plan:  -Medicine admit.  -OSW for tPA.  -INR/aPTT.  - HgbA1c, fasting lipid panel, TSH and Vit B12 level.  -If Vitamin B12 level is not over 500, replete.  - Needs high intensity statin and is non adherent to Lipitor 80mg  qd. -LDL goal < 70.  -HbA1c goal < 7.  -CTA head and neck pending - Frequent neuro checks. - follow NIHSS per protocol.  -  Echocardiogram. -ASA 81mg  po + Plavix 75mg  PO daily. Duration depending on CTA head/neck - Risk factor modification. - cardiac telemetry monitoring for arrhythmia. - PT consult, OT consult, Speech consult. - stroke education. - Stroke team to follow. -Case management/SW for medication adherence. Is it a money issue?   Pt seen by , NP/Neuro and later by MD. Note/plan to be edited by MD as needed.  Pager:    Attending Neurohospitalist Addendum Patient seen and examined with APP/Resident. Agree with the history and physical as documented above. Agree with the plan as documented, which I helped formulate. I have independently reviewed the chart, obtained history, review of systems and examined the patient.I have personally reviewed pertinent head/neck/spine imaging (CT/MRI). Please feel free to call with any questions. Plan relayed to the primary team admitting resident  -- , MD Neurologist [facility] Pager: [phone]

## 2021-04-18 NOTE — ED Notes (Signed)
Patient transported to CT 

## 2021-04-19 ENCOUNTER — Observation Stay (HOSPITAL_COMMUNITY): Payer: Medicare HMO

## 2021-04-19 ENCOUNTER — Inpatient Hospital Stay (HOSPITAL_COMMUNITY): Payer: Medicare HMO

## 2021-04-19 DIAGNOSIS — I639 Cerebral infarction, unspecified: Secondary | ICD-10-CM

## 2021-04-19 DIAGNOSIS — E538 Deficiency of other specified B group vitamins: Secondary | ICD-10-CM | POA: Diagnosis present

## 2021-04-19 DIAGNOSIS — F1410 Cocaine abuse, uncomplicated: Secondary | ICD-10-CM | POA: Diagnosis present

## 2021-04-19 DIAGNOSIS — I252 Old myocardial infarction: Secondary | ICD-10-CM | POA: Diagnosis not present

## 2021-04-19 DIAGNOSIS — M459 Ankylosing spondylitis of unspecified sites in spine: Secondary | ICD-10-CM | POA: Diagnosis present

## 2021-04-19 DIAGNOSIS — I6389 Other cerebral infarction: Secondary | ICD-10-CM | POA: Diagnosis not present

## 2021-04-19 DIAGNOSIS — Z8673 Personal history of transient ischemic attack (TIA), and cerebral infarction without residual deficits: Secondary | ICD-10-CM

## 2021-04-19 DIAGNOSIS — R29704 NIHSS score 4: Secondary | ICD-10-CM | POA: Diagnosis not present

## 2021-04-19 DIAGNOSIS — Z66 Do not resuscitate: Secondary | ICD-10-CM | POA: Diagnosis present

## 2021-04-19 DIAGNOSIS — Y9301 Activity, walking, marching and hiking: Secondary | ICD-10-CM | POA: Diagnosis present

## 2021-04-19 DIAGNOSIS — I63532 Cerebral infarction due to unspecified occlusion or stenosis of left posterior cerebral artery: Secondary | ICD-10-CM | POA: Diagnosis present

## 2021-04-19 DIAGNOSIS — E7800 Pure hypercholesterolemia, unspecified: Secondary | ICD-10-CM | POA: Diagnosis not present

## 2021-04-19 DIAGNOSIS — I5032 Chronic diastolic (congestive) heart failure: Secondary | ICD-10-CM | POA: Diagnosis present

## 2021-04-19 DIAGNOSIS — E785 Hyperlipidemia, unspecified: Secondary | ICD-10-CM | POA: Diagnosis present

## 2021-04-19 DIAGNOSIS — G8194 Hemiplegia, unspecified affecting left nondominant side: Secondary | ICD-10-CM | POA: Diagnosis present

## 2021-04-19 DIAGNOSIS — I6329 Cerebral infarction due to unspecified occlusion or stenosis of other precerebral arteries: Secondary | ICD-10-CM | POA: Diagnosis present

## 2021-04-19 DIAGNOSIS — R29703 NIHSS score 3: Secondary | ICD-10-CM | POA: Diagnosis present

## 2021-04-19 DIAGNOSIS — R45851 Suicidal ideations: Secondary | ICD-10-CM | POA: Diagnosis present

## 2021-04-19 DIAGNOSIS — N1831 Chronic kidney disease, stage 3a: Secondary | ICD-10-CM | POA: Diagnosis present

## 2021-04-19 DIAGNOSIS — I130 Hypertensive heart and chronic kidney disease with heart failure and stage 1 through stage 4 chronic kidney disease, or unspecified chronic kidney disease: Secondary | ICD-10-CM | POA: Diagnosis present

## 2021-04-19 DIAGNOSIS — U071 COVID-19: Secondary | ICD-10-CM | POA: Diagnosis not present

## 2021-04-19 DIAGNOSIS — R29702 NIHSS score 2: Secondary | ICD-10-CM | POA: Diagnosis not present

## 2021-04-19 DIAGNOSIS — F331 Major depressive disorder, recurrent, moderate: Secondary | ICD-10-CM | POA: Diagnosis present

## 2021-04-19 DIAGNOSIS — R29701 NIHSS score 1: Secondary | ICD-10-CM | POA: Diagnosis not present

## 2021-04-19 DIAGNOSIS — W1830XA Fall on same level, unspecified, initial encounter: Secondary | ICD-10-CM | POA: Diagnosis present

## 2021-04-19 DIAGNOSIS — I10 Essential (primary) hypertension: Secondary | ICD-10-CM | POA: Diagnosis not present

## 2021-04-19 DIAGNOSIS — L89621 Pressure ulcer of left heel, stage 1: Secondary | ICD-10-CM | POA: Diagnosis present

## 2021-04-19 DIAGNOSIS — I739 Peripheral vascular disease, unspecified: Secondary | ICD-10-CM | POA: Diagnosis present

## 2021-04-19 DIAGNOSIS — Y92002 Bathroom of unspecified non-institutional (private) residence single-family (private) house as the place of occurrence of the external cause: Secondary | ICD-10-CM | POA: Diagnosis not present

## 2021-04-19 DIAGNOSIS — G629 Polyneuropathy, unspecified: Secondary | ICD-10-CM | POA: Diagnosis present

## 2021-04-19 DIAGNOSIS — M109 Gout, unspecified: Secondary | ICD-10-CM | POA: Diagnosis present

## 2021-04-19 LAB — ECHOCARDIOGRAM COMPLETE
Area-P 1/2: 2.46 cm2
Calc EF: 54.1 %
Height: 67 in
S' Lateral: 4 cm
Single Plane A2C EF: 50.1 %
Single Plane A4C EF: 56 %
Weight: 2880 oz

## 2021-04-19 LAB — RAPID URINE DRUG SCREEN, HOSP PERFORMED
Amphetamines: NOT DETECTED
Barbiturates: NOT DETECTED
Benzodiazepines: NOT DETECTED
Cocaine: POSITIVE — AB
Opiates: NOT DETECTED
Tetrahydrocannabinol: POSITIVE — AB

## 2021-04-19 LAB — BASIC METABOLIC PANEL
Anion gap: 8 (ref 5–15)
BUN: 14 mg/dL (ref 8–23)
CO2: 25 mmol/L (ref 22–32)
Calcium: 8.8 mg/dL — ABNORMAL LOW (ref 8.9–10.3)
Chloride: 106 mmol/L (ref 98–111)
Creatinine, Ser: 1.51 mg/dL — ABNORMAL HIGH (ref 0.61–1.24)
GFR, Estimated: 49 mL/min — ABNORMAL LOW (ref >60)
Glucose, Bld: 116 mg/dL — ABNORMAL HIGH (ref 70–99)
Potassium: 3.6 mmol/L (ref 3.5–5.1)
Sodium: 139 mmol/L (ref 135–145)

## 2021-04-19 LAB — VITAMIN B12: Vitamin B-12: 176 pg/mL — ABNORMAL LOW (ref 180–914)

## 2021-04-19 LAB — LIPID PANEL
Cholesterol: 226 mg/dL — ABNORMAL HIGH (ref 0–200)
HDL: 38 mg/dL — ABNORMAL LOW (ref >40)
LDL Cholesterol: 141 mg/dL — ABNORMAL HIGH (ref 0–99)
Total CHOL/HDL Ratio: 5.9 RATIO
Triglycerides: 237 mg/dL — ABNORMAL HIGH (ref <150)
VLDL: 47 mg/dL — ABNORMAL HIGH (ref 0–40)

## 2021-04-19 LAB — SARS CORONAVIRUS 2 (TAT 6-24 HRS): SARS Coronavirus 2: NEGATIVE

## 2021-04-19 LAB — TSH: TSH: 2.545 u[IU]/mL (ref 0.350–4.500)

## 2021-04-19 MED ORDER — HYDROCODONE-ACETAMINOPHEN 10-325 MG PO TABS
1.0000 | ORAL_TABLET | Freq: Four times a day (QID) | ORAL | Status: DC | PRN
  Administered 2021-04-19 – 2021-05-04 (×27): 1 via ORAL
  Filled 2021-04-19 (×28): qty 1

## 2021-04-19 MED ORDER — CYANOCOBALAMIN 1000 MCG/ML IJ SOLN
1000.0000 ug | Freq: Once | INTRAMUSCULAR | Status: AC
  Administered 2021-04-19: 1000 ug via INTRAMUSCULAR
  Filled 2021-04-19: qty 1

## 2021-04-19 MED ORDER — PANTOPRAZOLE SODIUM 40 MG PO TBEC
40.0000 mg | DELAYED_RELEASE_TABLET | Freq: Every day | ORAL | Status: DC
  Administered 2021-04-19 – 2021-05-04 (×16): 40 mg via ORAL
  Filled 2021-04-19 (×16): qty 1

## 2021-04-19 MED ORDER — VITAMIN D 25 MCG (1000 UNIT) PO TABS
1000.0000 [IU] | ORAL_TABLET | Freq: Every day | ORAL | Status: DC
  Administered 2021-04-19 – 2021-05-04 (×16): 1000 [IU] via ORAL
  Filled 2021-04-19 (×16): qty 1

## 2021-04-19 MED ORDER — CITALOPRAM HYDROBROMIDE 10 MG PO TABS
10.0000 mg | ORAL_TABLET | Freq: Every day | ORAL | Status: DC
  Administered 2021-04-19 – 2021-04-20 (×2): 10 mg via ORAL
  Filled 2021-04-19 (×2): qty 1

## 2021-04-19 MED ORDER — GABAPENTIN 300 MG PO CAPS
300.0000 mg | ORAL_CAPSULE | Freq: Three times a day (TID) | ORAL | Status: DC
  Administered 2021-04-19 – 2021-05-04 (×46): 300 mg via ORAL
  Filled 2021-04-19 (×46): qty 1

## 2021-04-19 NOTE — Progress Notes (Signed)
Inpatient Rehab Admissions Coordinator Note:   Per therapy recommendations, pt was screened for CIR candidacy by Estill Dooms, PT, DPT.  At this time we are recommending a CIR consult and I will request an order per our protocol.  Please contact me with questions.   Estill Dooms, PT, DPT [phone] 04/19/21 12:06 PM

## 2021-04-19 NOTE — Evaluation (Signed)
Occupational Therapy Evaluation Patient Details Name: Marcus [name] MRN: [id number] DOB: 24-Nov-1950 Today's Date: 04/19/2021    History of Present Illness 70 yo male presenting to ED on 8/10 with L sided weakness and ataxic gait. MRI showing acute R pons and L occipital infarcts. PMH including GERD, depression, CHF, HLD, HTN, CKD III, severe PAD s/p Fem/Pop bypass 2021, CAD s/p MI in 3/22 and CABG, PCI to right coronary artery 3/22, ankylosing spondylitis of spine, and subacute left pontine infarct in 2017   Clinical Impression   PTA, pt was living with his son and daughter-in-law (and their three children) and was performing ADLs, light IADLs, and using cane for mobility; family assist with socks and shoes. Pt currently requiring Min-Mod A for UB ADLs, Mod-Max A for LB ADLs, and Min-Mod A for functional mobility with RW. Pt will require further acute OT to facilitate safe dc. Recommend dc to CIR for intensive OT to optimize safety, independence with ADLs, and return to PLOF.     Follow Up Recommendations  CIR    Equipment Recommendations  None recommended by OT    Recommendations for Other Services PT consult;Rehab consult;Speech consult     Precautions / Restrictions Precautions Precautions: Fall      Mobility Bed Mobility Overal bed mobility: Needs Assistance Bed Mobility: Supine to Sit     Supine to sit: Min guard;HOB elevated     General bed mobility comments: Min Guard A for safety    Transfers Overall transfer level: Needs assistance   Transfers: Sit to/from Stand Sit to Stand: Min assist         General transfer comment: Min A for gaining balance in standing    Balance Overall balance assessment: Needs assistance Sitting-balance support: No upper extremity supported;Feet supported Sitting balance-Leahy Scale: Fair     Standing balance support: Bilateral upper extremity supported;No upper extremity supported;During functional activity Standing  balance-Leahy Scale: Poor Standing balance comment: Reliant on UE support and physical A                           ADL either performed or assessed with clinical judgement   ADL Overall ADL's : Needs assistance/impaired Eating/Feeding: Set up;Sitting   Grooming: Oral care;Minimal assistance;Moderate assistance;Standing Grooming Details (indicate cue type and reason): Requiring Min-Mod A for posterior lean in standing at sink. Requiring increased time and effort to perform oral care due to decreased strength, coorindation, and sensation at L hand. Pt with difficutly positioning hand on tooth brush and maintaining grasp. Hitting himself in the chin mutiple times with tooth brush due to poor body awareness. Upper Body Bathing: Minimal assistance;Sitting   Lower Body Bathing: Moderate assistance;Sit to/from stand   Upper Body Dressing : Minimal assistance;Sitting Upper Body Dressing Details (indicate cue type and reason): donning gown like jacket Lower Body Dressing: Maximal assistance;Sit to/from stand Lower Body Dressing Details (indicate cue type and reason): donning socks Toilet Transfer: Moderate assistance;+2 for safety/equipment;Ambulation;RW (simulated recliner)           Functional mobility during ADLs: Moderate assistance;Rolling walker;+2 for safety/equipment General ADL Comments: Pt presenting with numbness and weakness throughout L side, poor coorindation, decreased balance, and poor safety     Vision Baseline Vision/History: Wears glasses Wears Glasses: At all times Patient Visual Report: No change from baseline       Perception     Praxis      Pertinent Vitals/Pain Pain Assessment: Faces Faces Pain Scale: Hurts  even more Pain Location: "my whole left side" Pain Descriptors / Indicators: Constant;Grimacing;Numbness;Pins and needles Pain Intervention(s): Monitored during session     Hand Dominance Left   Extremity/Trunk Assessment Upper Extremity  Assessment Upper Extremity Assessment: LUE deficits/detail LUE Deficits / Details: Reports numbness, tingling, and pens/needles from L traps to fingers. Noting tightness of traps. Able to perform AROM of ahnd,wrist, elbow, and shoulder but poor proprioception and body awareness, Difficulty with finger opposition requiring increased time and effort; watching hand LUE Sensation: decreased light touch;decreased proprioception LUE Coordination: decreased fine motor;decreased gross motor   Lower Extremity Assessment Lower Extremity Assessment: Defer to PT evaluation   Cervical / Trunk Assessment Cervical / Trunk Assessment: Other exceptions Cervical / Trunk Exceptions: Lower back pain   Communication Communication Communication: Expressive difficulties   Cognition Arousal/Alertness: Awake/alert Behavior During Therapy: WFL for tasks assessed/performed;Impulsive Overall Cognitive Status: Impaired/Different from baseline Area of Impairment: Attention;Following commands;Safety/judgement;Awareness;Problem solving                   Current Attention Level: Selective;Sustained   Following Commands: Follows one step commands with increased time;Follows multi-step commands inconsistently Safety/Judgement: Decreased awareness of safety Awareness: Intellectual;Emergent Problem Solving: Difficulty sequencing;Slow processing General Comments: Pt slightly impulsive and moving quickly. Dereased awareness of safety during activity. Does report "I am not gunna even try moving without anyone" when asked about getting out of chair. Pt requiring increased time and cues. Very motivated.   General Comments       Exercises     Shoulder Instructions      Home Living Family/patient expects to be discharged to:: Private residence Living Arrangements: Children (son and daughter-in-law; three grandchildren) Available Help at Discharge: Family Type of Home: House Home Access: Stairs to  enter Secretary/administrator of Steps: 1 Entrance Stairs-Rails: None Home Layout: Two level;Able to live on main level with bedroom/bathroom     Bathroom Shower/Tub:  (bathroom upstairs)   Bathroom Toilet: Standard     Home Equipment: Cane - single point;Shower seat;Bedside commode   Additional Comments: Sleeping on couch or air mattress on main level of son's home. Sponge baths at sink as shower is upstairs      Prior Functioning/Environment Level of Independence: Independent with assistive device(s)        Comments: walks with cane. assistance for socks and shoes due to back pain        OT Problem List: Decreased strength;Decreased range of motion;Decreased activity tolerance;Impaired balance (sitting and/or standing);Decreased knowledge of use of DME or AE;Decreased knowledge of precautions;Pain;Impaired UE functional use      OT Treatment/Interventions: Self-care/ADL training;Therapeutic exercise;Energy conservation;DME and/or AE instruction;Therapeutic activities;Patient/family education    OT Goals(Current goals can be found in the care plan section) Acute Rehab OT Goals Patient Stated Goal: "Do what ever it takes to be me again" OT Goal Formulation: With patient Time For Goal Achievement: 05/03/21 Potential to Achieve Goals: Good  OT Frequency: Min 2X/week   Barriers to D/C:            Co-evaluation PT/OT/SLP Co-Evaluation/Treatment: Yes Reason for Co-Treatment: For patient/therapist safety;To address functional/ADL transfers   OT goals addressed during session: ADL's and self-care      AM-PAC OT "6 Clicks" Daily Activity     Outcome Measure Help from another person eating meals?: A Little Help from another person taking care of personal grooming?: A Lot Help from another person toileting, which includes using toliet, bedpan, or urinal?: A Lot Help from another person bathing (including  washing, rinsing, drying)?: A Lot Help from another person to put on  and taking off regular upper body clothing?: A Little Help from another person to put on and taking off regular lower body clothing?: A Lot 6 Click Score: 14   End of Session Equipment Utilized During Treatment: Gait belt;Rolling walker Nurse Communication: Mobility status  Activity Tolerance: Patient tolerated treatment well.c Patient left: in chair;with call bell/phone within reach;with chair alarm set;with nursing/sitter in room  OT Visit Diagnosis: Unsteadiness on feet (R26.81);Other abnormalities of gait and mobility (R26.89);Muscle weakness (generalized) (M62.81);History of falling (Z91.81);Other symptoms and signs involving cognitive function;Pain Pain - Right/Left: Left Pain - part of body: Arm;Leg                Time: 0092-3300 OT Time Calculation (min): 21 min Charges:  OT General Charges $OT Visit: 1 Visit OT Evaluation $OT Eval Moderate Complexity: 1 Mod  Lowanda Cashaw MSOT, OTR/L Acute Rehab Pager: [phone] Office: [phone]  Theodoro Grist Anjoli Diemer 04/19/2021, 9:43 AM

## 2021-04-19 NOTE — TOC Initial Note (Signed)
Transition of Care [facility]) - Initial/Assessment Note    Patient Details  Name: Marcus [name] MRN: [id number] Date of Birth: Jun 14, 1951  Transition [facility] Care [facility]) CM/SW Contact:    Kermit Balo, RN Phone Number: 04/19/2021, 11:49 AM  Clinical Narrative:                 Pt is from home with his son. He states he would like to find a place of his own and asked about assistance. TOC is not able to find pts apartments through the housing authority. CM has updated him.  Pt uses medicaid transportation. He does his own medications at home and denies any issues.  Recommendations are for CIR.  TOC following.  Expected Discharge Plan: IP Rehab Facility Barriers to Discharge: Continued Medical Work up   Patient Goals and CMS Choice   CMS Medicare.gov Compare Post Acute Care list provided to:: Patient Choice offered to / list presented to : Patient  Expected Discharge Plan and Services Expected Discharge Plan: IP Rehab Facility   Discharge Planning Services: CM Consult Post Acute Care Choice: IP Rehab Living arrangements for the past 2 months: Single Family Home                                      Prior Living Arrangements/Services Living arrangements for the past 2 months: Single Family Home Lives with:: Adult Children Patient language and need for interpreter reviewed:: Yes Do you feel safe going back to the place where you live?: Yes      Need for Family Participation in Patient Care: Yes (Comment)   Current home services: DME (cane) Criminal Activity/Legal Involvement Pertinent to Current Situation/Hospitalization: No - Comment as needed  Activities of Daily Living Home Assistive Devices/Equipment: None ADL Screening (condition at time of admission) Patient's cognitive ability adequate to safely complete daily activities?: Yes Is the patient deaf or have difficulty hearing?: No Does the patient have difficulty seeing, even when wearing glasses/contacts?: No Does  the patient have difficulty concentrating, remembering, or making decisions?: No Patient able to express need for assistance with ADLs?: Yes Does the patient have difficulty dressing or bathing?: No Independently performs ADLs?: No Communication: Independent Dressing (OT): Independent Grooming: Needs assistance Is this a change from baseline?: Pre-admission baseline Feeding: Independent Bathing: Needs assistance Is this a change from baseline?: Pre-admission baseline Toileting: Needs assistance Is this a change from baseline?: Pre-admission baseline In/Out Bed: Needs assistance Is this a change from baseline?: Change from baseline, expected to last >3 days Walks in Home: Needs assistance Is this a change from baseline?: Change from baseline, expected to last >3 days Does the patient have difficulty walking or climbing stairs?: Yes Weakness of Legs: Left Weakness of Arms/Hands: Left  Permission Sought/Granted                  Emotional Assessment Appearance:: Appears stated age Attitude/Demeanor/Rapport: Engaged Affect (typically observed): Accepting Orientation: : Oriented to Self, Oriented to Place, Oriented to  Time, Oriented to Situation   Psych Involvement: No (comment)  Admission diagnosis:  CVA (cerebrovascular accident due to intracerebral hemorrhage) (HCC) [I61.9] Stroke, acute, thrombotic [facility]) [I63.9] Patient Active Problem List   Diagnosis Date Noted   CVA (cerebrovascular accident due to intracerebral hemorrhage) (HCC) 04/18/2021   Critical lower limb ischemia (HCC) 05/25/2020   Acute coronary syndrome (HCC) 12/21/2019   PAD (peripheral artery disease) (HCC) 12/07/2019  Peripheral vascular disease (HCC) 12/07/2019   Chest pain 09/26/2017   Cerebrovascular accident (CVA) (HCC)    Acute combined systolic and diastolic CHF, NYHA class 3 (HCC) 07/16/2016   Acute cerebral infarction (HCC) 07/16/2016   Benign essential HTN 07/16/2016   HLD (hyperlipidemia)  07/16/2016   Non compliance w medication regimen 07/16/2016   CKD (chronic kidney disease) stage 3, GFR 30-59 ml/min (HCC) 07/16/2016   CVA (cerebral vascular accident) (HCC) 07/16/2016   Chest pain at rest 07/06/2014   Hypertension 03/11/2011   CAD (coronary artery disease) 03/11/2011   PCP:  Oneita Hurt, No Pharmacy:   [id number] [place], [place] - [address] AT [facility] [address] [place] [address] Phone: [phone] Fax: [phone]     Social Determinants of Health (SDOH) Interventions    Readmission Risk Interventions Readmission Risk Prevention Plan 12/09/2019  Transportation Screening Complete  PCP or Specialist Appt within 5-7 Days Complete  Home Care Screening Complete  Medication Review (RN CM) Complete  Some recent data might be hidden

## 2021-04-19 NOTE — Evaluation (Signed)
Physical Therapy Evaluation Patient Details Name: Marcus [name] MRN: [id number] DOB: 1951/07/07 Today's Date: 04/19/2021   History of Present Illness  70 yo male presenting to ED on 8/10 with L sided weakness and ataxic gait. MRI showing acute R pons and L occipital infarcts. PMH including GERD, depression, CHF, HLD, HTN, CKD III, severe PAD s/p Fem/Pop bypass 2021, CAD s/p MI in 3/22 and CABG, PCI to right coronary artery 3/22, ankylosing spondylitis of spine, and subacute left pontine infarct in 2017  Clinical Impression  PTA, patient lives with son, daughter in law, and 3 grandchildren and using cane for mobility. Patient requiring min-modA for ambulation with RW due to ataxia and impaired balance. Increasing assist level with fatigue. Patient presents with impaired balance, generalized weakness, impaired sensation, impaired coordination, decreased activity tolerance, and impaired functional mobility. Patient will benefit from skilled PT services during acute stay to address listed deficits. Recommend CIR following discharge to maximize functional independence and safety prior to returning home.     Follow Up Recommendations CIR    Equipment Recommendations  Other (comment) (TBD)    Recommendations for Other Services Rehab consult     Precautions / Restrictions Precautions Precautions: Fall Restrictions Weight Bearing Restrictions: No      Mobility  Bed Mobility Overal bed mobility: Needs Assistance Bed Mobility: Supine to Sit     Supine to sit: Min guard;HOB elevated     General bed mobility comments: Min guard A for safety    Transfers Overall transfer level: Needs assistance Equipment used: [name] Transfers: Sit to/from Stand Sit to Stand: Min assist         General transfer comment: Min A for gaining balance upon standing. Cues for hand placement  Ambulation/Gait Ambulation/Gait assistance: Min assist;Mod assist Gait Distance (Feet): 20  Feet Assistive device: [name] Gait Pattern/deviations: Step-to pattern;Decreased stance time - left;Decreased step length - left;Decreased stride length;Decreased weight shift to left;Ataxic;Staggering left;Wide base of support Gait velocity: decreased   General Gait Details: Initially minA for balance. Required cues for managment of RW especially with turns. Patient impulsive at times. With fatigue, patient required modA to maintain balance and staggering to L. Unsteady throughout  Stairs            Wheelchair Mobility    Modified Rankin (Stroke Patients Only) Modified Rankin (Stroke Patients Only) Pre-Morbid Rankin Score: Moderate disability Modified Rankin: Moderately severe disability     Balance Overall balance assessment: Needs assistance Sitting-balance support: No upper extremity supported;Feet supported Sitting balance-Leahy Scale: Fair     Standing balance support: Bilateral upper extremity supported;No upper extremity supported;During functional activity Standing balance-Leahy Scale: Poor Standing balance comment: Reliant on UE support and external assist                             Pertinent Vitals/Pain Pain Assessment: Faces Faces Pain Scale: Hurts even more Pain Location: "my whole left side" Pain Descriptors / Indicators: Constant;Grimacing;Numbness;Pins and needles Pain Intervention(s): Monitored during session    Home Living Family/patient expects to be discharged to:: Private residence Living Arrangements: Children (son and daughter-in-law; three grandchildren) Available Help at Discharge: Family Type of Home: House Home Access: Stairs to enter Entrance Stairs-Rails: None Entrance Stairs-Number of Steps: 1 Home Layout: Two level;Able to live on main level with bedroom/bathroom Home Equipment: Gilmer Mor - single point;Shower seat;Bedside commode Additional Comments: Sleeping on couch or air mattress on main level of son's  home. Sponge baths at sink as shower is upstairs    Prior Function Level of Independence: Independent with assistive device(s)         Comments: walks with cane. assistance for socks and shoes due to back pain     Hand Dominance   Dominant Hand: Left    Extremity/Trunk Assessment   Upper Extremity Assessment Upper Extremity Assessment: Defer to OT evaluation LUE Deficits / Details: Reports numbness, tingling, and pens/needles from L traps to fingers. Noting tightness of traps. Able to perform AROM of ahnd,wrist, elbow, and shoulder but poor proprioception and body awareness, Difficulty with finger opposition requiring increased time and effort; watching hand LUE Sensation: decreased light touch;decreased proprioception LUE Coordination: decreased fine motor;decreased gross motor    Lower Extremity Assessment Lower Extremity Assessment: LLE deficits/detail LLE Deficits / Details: grossly 4+/5. Patient with complaints of L side pain due to hx of pinched nerve on L side LLE Sensation: decreased light touch;decreased proprioception LLE Coordination: decreased fine motor;decreased gross motor    Cervical / Trunk Assessment Cervical / Trunk Assessment: Other exceptions Cervical / Trunk Exceptions: Lower back pain  Communication   Communication: Expressive difficulties  Cognition Arousal/Alertness: Awake/alert Behavior During Therapy: WFL for tasks assessed/performed;Impulsive Overall Cognitive Status: Impaired/Different from baseline Area of Impairment: Attention;Following commands;Safety/judgement;Awareness;Problem solving                   Current Attention Level: Selective;Sustained   Following Commands: Follows one step commands with increased time;Follows multi-step commands inconsistently Safety/Judgement: Decreased awareness of safety Awareness: Intellectual;Emergent Problem Solving: Difficulty sequencing;Slow processing General Comments: Pt slightly impulsive  and moving quickly. Dereased awareness of safety during activity. Does report "I am not gunna even try moving without anyone" when asked about getting out of chair. Pt requiring increased time and cues. Very motivated.      General Comments      Exercises     Assessment/Plan    PT Assessment Patient needs continued PT services  PT Problem List Decreased strength;Decreased activity tolerance;Decreased mobility;Decreased balance;Decreased coordination;Decreased knowledge of use of DME;Decreased cognition;Decreased safety awareness;Decreased knowledge of precautions;Impaired sensation       PT Treatment Interventions DME instruction;Gait training;Stair training;Therapeutic activities;Functional mobility training;Therapeutic exercise;Balance training;Neuromuscular re-education;Patient/family education    PT Goals (Current goals can be found in the Care Plan section)  Acute Rehab PT Goals Patient Stated Goal: "Do what ever it takes to be me again" PT Goal Formulation: With patient Time For Goal Achievement: 05/03/21 Potential to Achieve Goals: Good    Frequency Min 4X/week   Barriers to discharge        Co-evaluation PT/OT/SLP Co-Evaluation/Treatment: Yes Reason for Co-Treatment: For patient/therapist safety;To address functional/ADL transfers PT goals addressed during session: Mobility/safety with mobility;Balance;Proper use of DME OT goals addressed during session: ADL's and self-care       AM-PAC PT "6 Clicks" Mobility  Outcome Measure Help needed turning from your back to your side while in a flat bed without using bedrails?: A Little Help needed moving from lying on your back to sitting on the side of a flat bed without using bedrails?: A Little Help needed moving to and from a bed to a chair (including a wheelchair)?: A Little Help needed standing up from a chair using your arms (e.g., wheelchair or bedside chair)?: A Little Help needed to walk in hospital room?: A  Lot Help needed climbing 3-5 steps with a railing? : A Lot 6 Click Score: 16    End of Session Equipment  Utilized During Treatment: Gait belt Activity Tolerance: Patient tolerated treatment well Patient left: in chair;with call bell/phone within reach;with chair alarm set Nurse Communication: Mobility status PT Visit Diagnosis: Unsteadiness on feet (R26.81);Muscle weakness (generalized) (M62.81);Other abnormalities of gait and mobility (R26.89);Difficulty in walking, not elsewhere classified (R26.2);Ataxic gait (R26.0);Other symptoms and signs involving the nervous system (L07.867)    Time: 5449-2010 PT Time Calculation (min) (ACUTE ONLY): 20 min   Charges:   PT Evaluation $PT Eval Moderate Complexity: 1 Mod          Saina Waage A. Dan Humphreys PT, DPT Acute Rehabilitation Services Pager [phone] Office [phone]   Viviann Spare 04/19/2021, 10:02 AM

## 2021-04-19 NOTE — Progress Notes (Signed)
  Echocardiogram 2D Echocardiogram has been performed.  Burnard Hawthorne 04/19/2021, 9:28 AM

## 2021-04-19 NOTE — Progress Notes (Signed)
Lower extremity venous bilateral study completed.   Please see CV Proc for preliminary results.   Morrill Bomkamp, RDMS, RVT  

## 2021-04-19 NOTE — Progress Notes (Addendum)
   Subjective:  Marcus [name] is a 70 year old person multiple arterial clots history including 3 myocardial infarctions, stroke in 2017, an PAD who is admitted for acute strokes of R pons and L occipital lobes and left sided weakness and left sided paresthesias.   Marcus [name] notes that he is doing well overall this morning. He has continued to endorse left sided paresthesias and left hand clumsiness- specifically noting that brushing his teeth was difficult. He was seen by PT and OT this morning and was recommended to CIR.   Objective:  Vital signs in last 24 hours: Vitals:   04/18/21 2130 04/18/21 2205 04/19/21 0400 04/19/21 0715  BP: (!) 186/97 (!) 173/72 138/76 (!) 156/85  Pulse: 88 88 69 66  Resp: (!) 24 19 18 20   Temp:  98.3 F (36.8 C) 97.7 F (36.5 C) 98 F (36.7 C)  TempSrc:  Oral Oral Oral  SpO2: 99% 97% 99% 99%  Weight:      Height:       Weight change:   Intake/Output Summary (Last 24 hours) at 04/19/2021 1135 Last data filed at 04/19/2021 0700 Gross per 24 hour  Intake --  Output 200 ml  Net -200 ml   General: Pleasant, well-appearing gentleman sitting up in chair. No acute distress. CV: RRR. No murmurs, rubs, or gallops. Mild ankle edema Pulmonary: Lungs CTAB. Normal effort. No wheezing or rales. Abdominal: Soft, nontender, nondistended. Normal bowel sounds. Extremities: Palpable radial and 1+ DP pulses. Normal ROM. Skin: Warm and dry. No obvious rash or lesions. Neuro: A&Ox3. Moves all extremities. Extremity strength preserved bilaterally. Paresthesias in V1 and V2, otherwise CN II-XII intact. Paresthesias reported in LUE and LLE. Psych: Normal mood and affect    Assessment/Plan:  Principal Problem:   Acute cerebral infarction [facility]) Active Problems:   Hypertension   HLD (hyperlipidemia)   CKD (chronic kidney disease) stage 3, GFR 30-59 ml/min (HCC)  Acute ischemic strokes of R pons and L occipital lobe Presented with acute left sided weakness and  numbness which evolved into paresthesias. 04/18/21 MRI revealed small acute infarcts of R pons and L occipital lobe in addition to chronic small vessel infarcts. Out of the window for tPA. 04/18/21 CTA Head and Neck showing 60% stenosis of R carotid bult and focal high grade stenosis of proximal L inferior M2, also R PCA with mild stenosis. Pt with 45+ pack year history and not regularly adherent to risk reducing medications including Plavix, ASA, and Lipitor. 04/19/21 Echo with EF 55-60% and grade I diastolic dysfunction, and no intracardiac source of embolism detected. LDL at 140 and A1C at 6.2. PT/OT have recommended CIR for this patient. -Permissive HTN for first 48 hours -81mg  ASA, 75mg  Plavix, and 80mg  Lipitor -Frequent neuro checks and continuing on telemetry  B12 deficiency 04/18/21 B12 low at 176. - B12 IM today  Prior to admission living arrangement: son's home Anticipated discharge location: CIR Barriers to discharge: CIR placement DVT prophylaxis: Lovenox Dispo: 1-2 days  , Medical Student 04/19/2021, 11:35 AM

## 2021-04-19 NOTE — Progress Notes (Addendum)
STROKE TEAM PROGRESS NOTE   ATTENDING NOTE: I reviewed above note and agree with the assessment and plan. Pt was seen and examined.   70 year old male with history of hypertension, CAD status post stent and CABG, CKD, PAD admitted for diplopia for 3 weeks and difficulty walking with fall.  Patient stated that she had diplopia 3 [place] ago out [place] so he did not seek for medical attention.  Diplopia gradually getting better however he had difficulty walking, with falling at home.  MRI showed right pontine subacute infarct and left PCA small infarcts.  CTA head and neck right ICA 60% stenosis, left M2 stenosis and bilateral ICA siphon atherosclerosis.  LDL 141, A1c 6.2, UDS positive for cocaine and THC.  LE venous Doppler negative for DVT.  EF 55 to 60%.  Creatinine 0.70.  On exam, awake, alert, eyes open, orientated to age, place, time. No aphasia, fluent language, mild dysarthria, following all simple commands. Able to name and repeat. No gaze palsy, tracking bilaterally, visual field full. Mild left nasolabial fold flattening. Tongue midline. RUE 5/5, left UE 5-/5 with pronator drift. RLE 5/5, LLE proximal 4-/5 and distal 4/5. Sensation decreased on the left, right FTN intact but left FTN dysmetria, gait not tested.   Etiology for patient stroke likely small vessel disease given multiple stroke risk factors, however cardioembolic source cannot be completed ruled out given multifocal infarcts.  Patient admitted he has not compliant with medication, medication compliance education provided.  Recommend aspirin 81 and Plavix 75 DAPT for 3 weeks and then aspirin alone.  He is on Lipitor 80, continue on discharge.  Recommend 30-day card event monitor as outpatient to rule out A. fib given multifocal infarcts.  PT/OT recommend CIR.  For detailed assessment and plan, please refer to above as I have made changes wherever appropriate.   Neurology will sign off. Please call with questions. Pt [facility]  [facility] 4 weeks. Thanks for the consult.   Marvel PlanJindong Jackelyne Sayer, MD PhD Stroke Neurology 04/19/2021 7:10 PM    INTERVAL HISTORY  Mr. Su HiltRoberts presented with numbness/tingling of his left face, left upper and lower extremities and ataxic gait.  He woke up and had difficulty walking.  He fell backwards onto the commode.  He has had intermittent vision changes for the past 2 weeks and worsening back pain.   He has been noncompliant with his medications.  He was prescribed plavix for his PAD and coronary stents but his is not taking it.  He has been treating his back pain with Marijuana which "takes the edge off".  His UDS was positive for cocaine and Tetrahydorcannabinol.    MRI brain was obtained which revealed small acute infarcts of the right pons and left occipital lobe  Vitals:   04/19/21 0400 04/19/21 0715 04/19/21 1213 04/19/21 1600  BP: 138/76 (!) 156/85 (!) 160/80 (!) 151/79  Pulse: 69 66 66 (!) 57  Resp: 18 20 14 14   Temp: 97.7 F (36.5 C) 98 F (36.7 C) 97.9 F (36.6 C) 97.8 F (36.6 C)  TempSrc: Oral Oral Oral Oral  SpO2: 99% 99% 98%   Weight:      Height:       CBC:  Recent Labs  Lab 04/18/21 1221  WBC 5.2  NEUTROABS 3.0  HGB 14.3  HCT 45.8  MCV 86.4  PLT 157   Basic Metabolic Panel:  Recent Labs  Lab 04/18/21 1221 04/19/21 0146  NA 138 139  K 4.3 3.6  CL 105 106  CO2 26 25  GLUCOSE 93 116*  BUN 9 14  CREATININE 1.48* 1.51*  CALCIUM 9.3 8.8*   Lipid Panel:  Recent Labs  Lab 04/19/21 0146  CHOL 226*  TRIG 237*  HDL 38*  CHOLHDL 5.9  VLDL 47*  LDLCALC 141*   HgbA1c:  Recent Labs  Lab 04/18/21 1221  HGBA1C 6.2*   Urine Drug Screen:  Recent Labs  Lab 04/19/21 1118  LABOPIA NONE DETECTED  COCAINSCRNUR POSITIVE*  LABBENZ NONE DETECTED  AMPHETMU NONE DETECTED  THCU POSITIVE*  LABBARB NONE DETECTED    Alcohol Level No results for input(s): ETH in the last 168 hours.  IMAGING past 24 hours ECHOCARDIOGRAM  COMPLETE  Result Date: 04/19/2021    ECHOCARDIOGRAM REPORT   Patient Name:   Marcus [name] Date of Exam: 04/19/2021 Medical Rec #:  [id number]       Height:       67.0 in Accession #:    [id number]      Weight:       180.0 lb Date of Birth:  Mar 20, 1951       BSA:          1.934 m Patient Age:    69 years        BP:           156/85 mmHg Patient Gender: M               HR:           60 bpm. Exam Location:  Inpatient Procedure: 2D Echo, Cardiac Doppler and Color Doppler Indications:    Stroke I63.9  History:        Patient has prior history of Echocardiogram examinations, most                 recent 12/22/2019. CHF, Previous Myocardial Infarction, Stroke;                 Risk Factors:Hypertension, Dyslipidemia and Former Smoker. PVD.  Sonographer:    Renella Cunas RDCS Referring Phys: [id number] Leda Gauze IMPRESSIONS  1. Left ventricular ejection fraction, by estimation, is 55 to 60%. The left ventricle has normal function. The left ventricle has no regional wall motion abnormalities. Left ventricular diastolic parameters are consistent with Grade I diastolic dysfunction (impaired relaxation).  2. Right ventricular systolic function is normal. The right ventricular size is normal. Tricuspid regurgitation signal is inadequate for assessing PA pressure.  3. The mitral valve is grossly normal. Trivial mitral valve regurgitation.  4. The aortic valve is tricuspid. Aortic valve regurgitation is not visualized. Comparison(s): Changes from prior study are noted. 12/22/2019: LVEF 60-65%. Conclusion(s)/Recommendation(s): No intracardiac source of embolism detected on this transthoracic study. A transesophageal echocardiogram is recommended to exclude cardiac source of embolism if clinically indicated. FINDINGS  Left Ventricle: Left ventricular ejection fraction, by estimation, is 55 to 60%. The left ventricle has normal function. The left ventricle has no regional wall motion abnormalities. The left ventricular internal  cavity size was normal in size. There is  no left ventricular hypertrophy. Left ventricular diastolic parameters are consistent with Grade I diastolic dysfunction (impaired relaxation). Indeterminate filling pressures. Right Ventricle: The right ventricular size is normal. No increase in right ventricular wall thickness. Right ventricular systolic function is normal. Tricuspid regurgitation signal is inadequate for assessing PA pressure. Left Atrium: Left atrial size was normal in size. Right Atrium: Right atrial size was normal in size. Pericardium: There  is no evidence of pericardial effusion. Mitral Valve: The mitral valve is grossly normal. Trivial mitral valve regurgitation. Tricuspid Valve: The tricuspid valve is grossly normal. Tricuspid valve regurgitation is trivial. Aortic Valve: The aortic valve is tricuspid. Aortic valve regurgitation is not visualized. Pulmonic Valve: The pulmonic valve was normal in structure. Pulmonic valve regurgitation is not visualized. Aorta: The aortic root and ascending aorta are structurally normal, with no evidence of dilitation. IAS/Shunts: No atrial level shunt detected by color flow Doppler.  LEFT VENTRICLE PLAX 2D LVIDd:         5.20 cm      Diastology LVIDs:         4.00 cm      LV e' medial:    6.09 cm/s LV PW:         0.90 cm      LV E/e' medial:  13.1 LV IVS:        0.90 cm      LV e' lateral:   10.20 cm/s LVOT diam:     2.00 cm      LV E/e' lateral: 7.8 LV SV:         67 LV SV Index:   34 LVOT Area:     3.14 cm  LV Volumes (MOD) LV vol d, MOD A2C: 112.0 ml LV vol d, MOD A4C: 121.0 ml LV vol s, MOD A2C: 55.9 ml LV vol s, MOD A4C: 53.3 ml LV SV MOD A2C:     56.1 ml LV SV MOD A4C:     121.0 ml LV SV MOD BP:      63.3 ml RIGHT VENTRICLE RV S prime:     11.80 cm/s TAPSE (M-mode): 1.8 cm LEFT ATRIUM             Index       RIGHT ATRIUM           Index LA diam:        3.90 cm 2.02 cm/m  RA Area:     12.20 cm LA Vol (A2C):   30.4 ml 15.72 ml/m RA Volume:   33.20 ml  17.17  ml/m LA Vol (A4C):   17.2 ml 8.89 ml/m LA Biplane Vol: 23.2 ml 12.00 ml/m  AORTIC VALVE LVOT Vmax:   112.50 cm/s LVOT Vmean:  74.600 cm/s LVOT VTI:    0.212 m  AORTA Ao Root diam: 3.30 cm Ao Asc diam:  3.30 cm MITRAL VALVE MV Area (PHT): 2.46 cm    SHUNTS MV Decel Time: 308 msec    Systemic VTI:  0.21 m MV E velocity: 79.80 cm/s  Systemic Diam: 2.00 cm MV A velocity: 87.10 cm/s MV E/A ratio:  0.92 Zoila Shutter MD Electronically signed by Zoila Shutter MD Signature Date/Time: 04/19/2021/10:36:08 AM    Final    VAS [place] LOWER EXTREMITY VENOUS (DVT)  Result Date: 04/19/2021  Lower Venous DVT Study Patient Name:  XAVI TOMASIK  Date of Exam:   04/19/2021 Medical Rec #: [id number]        Accession #:    [id number] Date of Birth: 16-Sep-1950        Patient Gender: M Patient Age:   56 years Exam Location:  [facility] Procedure:      VAS [place] LOWER EXTREMITY VENOUS (DVT) Referring Phys: Garner Gavel --------------------------------------------------------------------------------  Indications: Stroke.  Comparison Study: No prior venous studies. Performing Technologist: Jean Rosenthal  Examination Guidelines: A complete evaluation includes B-mode imaging, spectral Doppler, color Doppler, and  power Doppler as needed of all accessible portions of each vessel. Bilateral testing is considered an integral part of a complete examination. Limited examinations for reoccurring indications may be performed as noted. The reflux portion of the exam is performed with the patient in reverse Trendelenburg.  +---------+---------------+---------+-----------+----------+--------------+ RIGHT    CompressibilityPhasicitySpontaneityPropertiesThrombus Aging +---------+---------------+---------+-----------+----------+--------------+ CFV      Full           Yes      Yes                                 +---------+---------------+---------+-----------+----------+--------------+ SFJ      Full                                                         +---------+---------------+---------+-----------+----------+--------------+ FV Prox  Full                                                        +---------+---------------+---------+-----------+----------+--------------+ FV Mid   Full                                                        +---------+---------------+---------+-----------+----------+--------------+ FV DistalFull                                                        +---------+---------------+---------+-----------+----------+--------------+ PFV      Full                                                        +---------+---------------+---------+-----------+----------+--------------+ POP      Full           Yes      Yes                                 +---------+---------------+---------+-----------+----------+--------------+ PTV      Full                                                        +---------+---------------+---------+-----------+----------+--------------+ PERO     Full                                                        +---------+---------------+---------+-----------+----------+--------------+   +---------+---------------+---------+-----------+----------+--------------+  LEFT     CompressibilityPhasicitySpontaneityPropertiesThrombus Aging +---------+---------------+---------+-----------+----------+--------------+ CFV      Full           Yes      Yes                                 +---------+---------------+---------+-----------+----------+--------------+ SFJ      Full                                                        +---------+---------------+---------+-----------+----------+--------------+ FV Prox  Full                                                        +---------+---------------+---------+-----------+----------+--------------+ FV Mid   Full                                                         +---------+---------------+---------+-----------+----------+--------------+ FV DistalFull                                                        +---------+---------------+---------+-----------+----------+--------------+ PFV      Full                                                        +---------+---------------+---------+-----------+----------+--------------+ POP      Full           Yes      Yes                                 +---------+---------------+---------+-----------+----------+--------------+ PTV      Full                                                        +---------+---------------+---------+-----------+----------+--------------+ PERO     Full                                                        +---------+---------------+---------+-----------+----------+--------------+     Summary: RIGHT: - There is no evidence of deep vein thrombosis in the lower extremity.  - No cystic structure found in the popliteal fossa.  LEFT: - There is no evidence of deep vein thrombosis in the lower extremity.  - No  cystic structure found in the popliteal fossa.  *See table(s) above for measurements and observations.    Preliminary     PHYSICAL EXAM GENERAL: Chronically ill appearing male who appears older than his stated age. Awake, alert in NAD.  HEENT: normocephalic and atraumatic. Missing some teeth.  LUNGS - Normal respiratory effort.  CV - RRR on tele. ABDOMEN - Soft, nontender. Ext: warm, well perfused.   Neuro - awake, alert, eyes open, orientated to age, place, time.  No aphasia, fluent language, mild dysarthria following all simple commands. Able to name and repeat. No gaze palsy, tracking bilaterally, visual field full. Mild left nasolabial fold flattening. Tongue midline.  RUE 5/5, left UE 5-/5 with pronator drift. RLE 5/5, LLE proximal 4-/5 and distal 4/5.  Sensation decreased on the left, right FTN intact but left FTN dysmetria, Gait not  tested.  ASSESSMENT/PLAN Mr. MIKEL HARDGROVE is a 70 y.o. male with history of GERD, depression, CHF, HLD, HTN, CKD III, severe PAD s/p Fem/Pop bypass 2021, CAD s/p MI in 3/22 and CABG, PCI to right coronary artery 3/22, ankylosing spondylitis of spine, and subacute left pontine infarct in 2017 presenting with difficulty walking.   Stroke:   Right pontine and left PCA infarcts likely secondary to small vessel disease given multiple risk factors.  However, cardioembolic source cannot be completely ruled out CTA head & neck focal high-grade stenosis of the proximal left inferior M2 division, Calcified atherosclerotic plaque at the right carotid bulb resulting in approximately 60% stenosis  MRI  small acute infarcts of the right pons and left occipital lobe 2D Echo EF 55-60%, grade 1 diastolic dysfunction BLE ultrasound: negative for DVT LDL 141 HgbA1c 6.2 VTE prophylaxis - Lovenox    Diet   Diet regular Room service appropriate? Yes; Fluid consistency: Thin  Recommend 30 day cardiac event monitoring as outpatient No antithrombotic prior to admission, now on aspirin 81 mg daily and clopidogrel 75 mg daily.  Therapy recommendations:  CIR Disposition:  pending Encouraged patient to take all medications as prescribed and education the patient about his stroke and high risk for recurrent strokes  Hypertension Home meds:  Imdur 15mg  daily, lisinopril 40mg  daily, lopressor 50mg  bid (does not take regularly) Stable Permissive hypertension (OK if < 220/120) but gradually normalize in 5-7 days Long-term BP goal normotensive  Hyperlipidemia Home meds:  atorvastatin 80mg  daily (does not take regularly) LDL 141, goal < 70 Restarted Atorvastatin 80mg  daily High intensity statin  Continue statin at discharge  Diabetes type II Controlled Home meds:  none HgbA1c 6.2, goal < 7.0 CBGs No results for input(s): GLUCAP in the last 72 hours.  SSI  Other Stroke Risk Factors Advanced Age >/= 11   Cigarette smoker advised to stop smoking ETOH use, alcohol level <10, advised to drink no more than 1- 2 drink(s) a day Substance abuse - UDS:  THC POSITIVE, Cocaine POSITIVE. Patient advised to stop using due to stroke risk. Obesity, Body mass index is 28.19 kg/m., associated with increased stroke risk, recommend weight loss, diet and exercise as appropriate  Hx stroke/TIA:  subacute left pontine infarct in 2017, Remote strokes in right thalamus, left pontine, and central pontine lacunar. In 2017, he was discharged on Plavix and ASA. Coronary artery disease s/p MI in 3/22 and CABG, PCI to right coronary artery 3/22  Other Active Problems severe PAD s/p Fem/Pop bypass 2021  Hospital day # 0  Lissy Olivencia-Simmons, ACNP-BC Stroke NP  To contact Stroke Continuity provider, please refer to  [email or web address]. After hours, contact General Neurology  

## 2021-04-19 NOTE — Progress Notes (Signed)
Pt emotional and tearful. Pt considering to end his life. No plan yet. He states that he will be a burden to his sons if he returns home and he adamantly doesn't want to go to a nursing home. Treatment team aware and en route.

## 2021-04-20 DIAGNOSIS — F331 Major depressive disorder, recurrent, moderate: Secondary | ICD-10-CM

## 2021-04-20 DIAGNOSIS — I639 Cerebral infarction, unspecified: Secondary | ICD-10-CM | POA: Diagnosis not present

## 2021-04-20 LAB — BASIC METABOLIC PANEL
Anion gap: 8 (ref 5–15)
BUN: 12 mg/dL (ref 8–23)
CO2: 25 mmol/L (ref 22–32)
Calcium: 9 mg/dL (ref 8.9–10.3)
Chloride: 106 mmol/L (ref 98–111)
Creatinine, Ser: 1.33 mg/dL — ABNORMAL HIGH (ref 0.61–1.24)
GFR, Estimated: 58 mL/min — ABNORMAL LOW (ref >60)
Glucose, Bld: 101 mg/dL — ABNORMAL HIGH (ref 70–99)
Potassium: 4.2 mmol/L (ref 3.5–5.1)
Sodium: 139 mmol/L (ref 135–145)

## 2021-04-20 MED ORDER — CITALOPRAM HYDROBROMIDE 10 MG PO TABS
20.0000 mg | ORAL_TABLET | Freq: Every day | ORAL | Status: DC
  Administered 2021-04-21 – 2021-05-04 (×14): 20 mg via ORAL
  Filled 2021-04-20 (×14): qty 2

## 2021-04-20 MED ORDER — RIVAROXABAN 10 MG PO TABS
10.0000 mg | ORAL_TABLET | Freq: Every day | ORAL | Status: DC
  Administered 2021-04-20 – 2021-05-04 (×15): 10 mg via ORAL
  Filled 2021-04-20 (×16): qty 1

## 2021-04-20 MED ORDER — TRAZODONE HCL 100 MG PO TABS
100.0000 mg | ORAL_TABLET | Freq: Every evening | ORAL | Status: DC | PRN
  Administered 2021-04-20 – 2021-05-02 (×6): 100 mg via ORAL
  Filled 2021-04-20 (×6): qty 1

## 2021-04-20 NOTE — Progress Notes (Signed)
Physical Therapy Treatment Patient Details Name: Marcus [name] MRN: [id number] DOB: July 06, 1951 Today's Date: 04/20/2021    History of Present Illness 70 yo male presenting to ED on 8/10 with L sided weakness and ataxic gait. MRI showing acute R pons and L occipital infarcts. PMH including GERD, depression, CHF, HLD, HTN, CKD III, severe PAD s/p Fem/Pop bypass 2021, CAD s/p MI in 3/22 and CABG, PCI to right coronary artery 3/22, ankylosing spondylitis of spine, and subacute left pontine infarct in 2017    PT Comments    Upon arrival, patient with flat affect and depressed mood stating "I don't want to live anymore." Provided encouragement and notified RN. Session limited to in room ambulation due to patient stating "I don't want anyone to see me like this." Patient requires min-modA for ambulation with RW and LOB x1 requiring modA to maintain balance. Patient continues to be limited by L LE numbness and ataxia. Following ambulation, patient sat in recliner and within 2 minutes requested to return to bed. Updated d/c recommendation to SNF at discharge to maximize functional independence and safety.     Follow Up Recommendations  SNF;Supervision/Assistance - 24 hour     Equipment Recommendations  Rolling Charlye Spare with 5" wheels    Recommendations for Other Services       Precautions / Restrictions Precautions Precautions: Fall Restrictions Weight Bearing Restrictions: No    Mobility  Bed Mobility Overal bed mobility: Needs Assistance Bed Mobility: Supine to Sit;Sit to Supine     Supine to sit: Min guard Sit to supine: Min guard   General bed mobility comments: Min guard A for safety    Transfers Overall transfer level: Needs assistance Equipment used: Rolling Shagun Wordell (2 wheeled) Transfers: Sit to/from [employer] Sit to Stand: Min assist Stand pivot transfers: Min assist       General transfer comment: minA to rise and steady upon standing. cues for hand  placement with poor follow through. Sat in chair after ambulation and requested to return to bed, performed stand pivot with minA and RW  Ambulation/Gait Ambulation/Gait assistance: Min assist;Mod assist Gait Distance (Feet): 40 Feet Assistive device: Rolling Orian Amberg (2 wheeled) Gait Pattern/deviations: Step-to pattern;Decreased stance time - left;Decreased step length - left;Decreased stride length;Decreased weight shift to left;Ataxic;Staggering left;Wide base of support Gait velocity: decreased   General Gait Details: refused out of room mobility due to "I don't want anyone to see me like this". MinA initially for balance due to ataxia and with fatigue required modA. LOB x 1 to L during turning requiring modA to recover. Cues for slowed pace during turns for safety, poor follow through.   Stairs             Wheelchair Mobility    Modified Rankin (Stroke Patients Only) Modified Rankin (Stroke Patients Only) Pre-Morbid Rankin Score: Moderate disability Modified Rankin: Moderately severe disability     Balance Overall balance assessment: Needs assistance Sitting-balance support: No upper extremity supported;Feet supported Sitting balance-Leahy Scale: Fair     Standing balance support: Bilateral upper extremity supported;No upper extremity supported;During functional activity Standing balance-Leahy Scale: Poor Standing balance comment: Reliant on UE support and external assist                            Cognition Arousal/Alertness: Awake/alert Behavior During Therapy: Impulsive;Flat affect Overall Cognitive Status: Impaired/Different from baseline Area of Impairment: Attention;Following commands;Safety/judgement;Awareness;Problem solving  Current Attention Level: Selective;Sustained   Following Commands: Follows one step commands with increased time;Follows multi-step commands inconsistently Safety/Judgement: Decreased awareness of  safety Awareness: Intellectual;Emergent Problem Solving: Difficulty sequencing;Slow processing General Comments: Depressed mood and states "I don't want to live anymore". Provided encouragement and notified RN. Increased time required to process commands      Exercises      General Comments        Pertinent Vitals/Pain Pain Assessment: Faces Faces Pain Scale: Hurts little more Pain Location: L LE Pain Descriptors / Indicators: Cramping;Aching;Grimacing Pain Intervention(s): Monitored during session;Repositioned;Limited activity within patient's tolerance    Home Living                      Prior Function            PT Goals (current goals can now be found in the care plan section) Acute Rehab PT Goals Patient Stated Goal: to be less of a burden PT Goal Formulation: With patient Time For Goal Achievement: 05/03/21 Potential to Achieve Goals: Good Progress towards PT goals: Progressing toward goals    Frequency    Min 3X/week      PT Plan Discharge plan needs to be updated;Frequency needs to be updated    Co-evaluation              AM-PAC PT "6 Clicks" Mobility   Outcome Measure  Help needed turning from your back to your side while in a flat bed without using bedrails?: A Little Help needed moving from lying on your back to sitting on the side of a flat bed without using bedrails?: A Little Help needed moving to and from a bed to a chair (including a wheelchair)?: A Little Help needed standing up from a chair using your arms (e.g., wheelchair or bedside chair)?: A Little Help needed to walk in hospital room?: A Lot Help needed climbing 3-5 steps with a railing? : A Lot 6 Click Score: 16    End of Session Equipment Utilized During Treatment: Gait belt Activity Tolerance: Patient tolerated treatment well Patient left: with call bell/phone within reach;in bed;with bed alarm set Nurse Communication: Mobility status PT Visit Diagnosis:  Unsteadiness on feet (R26.81);Muscle weakness (generalized) (M62.81);Other abnormalities of gait and mobility (R26.89);Difficulty in walking, not elsewhere classified (R26.2);Ataxic gait (R26.0);Other symptoms and signs involving the nervous system (D66.440)     Time: 3474-2595 PT Time Calculation (min) (ACUTE ONLY): 35 min  Charges:  $Gait Training: 23-37 mins                     Shellie Goettl A. Dan Humphreys PT, DPT Acute Rehabilitation Services Pager [phone] Office [phone]    Viviann Spare 04/20/2021, 5:11 PM

## 2021-04-20 NOTE — Progress Notes (Signed)
Inpatient Rehab Admissions Coordinator:   Met with pt at the bedside to discuss CIR recommendations and goals/expectations of admission.  I reviewed 3 hr/day of therapy with average length of stay to be about 2 weeks, dependent upon progress.  I let pt know we typically recommend supervision 24/7 at discharge from CIR, which patient states he does not have.  I was able to speak to pt's son, who confirms that pt is welcome to stay with him, but he cannot provide 24/7 supervision as he works and has 3 small children to care for.  I will continue to explore dispo options. I did mention SNF to pt's son and he states he has been talking to pt about this, yet pt still adamantly refuses.   Shann Medal, PT, DPT Admissions Coordinator [phone] 04/20/21  2:21 PM

## 2021-04-20 NOTE — Consult Note (Signed)
[facility] Face-to-Face Psychiatry Consult   Reason for Consult: Depression and suicidal thoughts after stroke. Referring Physician: Ryann A. Atway Patient Identification: Marcus [name] MRN:  [id number] Principal Diagnosis: Acute cerebral infarction [facility]) Diagnosis:  Principal Problem:   Acute cerebral infarction [facility]) Active Problems:   Hypertension   HLD (hyperlipidemia)   CKD (chronic kidney disease) stage 3, GFR 30-59 ml/min (HCC)   Acute CVA (cerebrovascular accident) (HCC)   Total Time spent with patient: 45 minutes  Subjective: "I do not have a place to go" HPI:  Marcus [name] is a 70 y.o. male patient with past medical history of CAD s/p and, CABG, CKD, HLD, HTN and PAD and reported history of depression admitted with acute ischemic infarcts of right pons and left occipital lobe.  Psych consult placed due to depression and suicidal thoughts.  Patient is seen and examined today.  Patient states he has been feeling depressed because he cannot stay with his son.  His son and his's son's wife works and cannot take care of him. He states his son does not wants his kids to see him like that.  Patient stated he does not have a place to go to.  He denies active suicidal ideation but endorses passive suicidal ideation states" if I cannot go to a place to live alone, I just want to go".  When asked about what he meant by "I just want to go " he said "I don't want to live anymore".  He states he does not want to be a burden on anyone.  He denies any plan or intent.  He contracts for safety at this time.  He states he cannot go to his daughter's apartment as they do not allow him to live there.  He states if Child psychotherapist can find a place for him, he will be ok.  He does not want to go to assisted living facility.  He states "I want to be alone".   He denies homicidal ideation.  He endorses depressed mood, poor sleep, poor memory, anxiety, feeling hopeless, helpless, worthless. Patient states he wakes  up multiple times at night because of racing thoughts.  He denies any changes in appetite.  He denies manic type high energy episodes but endorses racing thoughts.  He states he has been thinking about negative thoughts.  Patient states he has been using trazodone on and off to help him with sleep and anxiety.  Patient reports visual hallucinations 3 weeks ago states he was seeing double and things floating. Currently, he denies any visual or auditory hallucinations. He denies history of verbal, physical and sexual abuse.  He reports using marijuana 2-3 times per week with last use 2 weeks ago.  He reports using cocaine occasionally when he cannot find pain medications with last use of cocaine 2 weeks ago.  He reports drinking alcohol occasionally (beer and liquor).  He quit smoking 10 years ago.  Patient denies past psychiatric hospitalization.  Patient denies past suicidal attempts.  Patient states he had been on Celexa but it does not help him.  He states trazodone helps him calm down. Patient was living with elder daughter in [place] before coming to [place] last Sunday.  He has been living with his step son since then. He denies access to guns. He is on disability. On examination, pt is anxious and cooperative, and oriented x4. His speech is mildly slurred with normal volume. Pt's mood is depressed with constricted and anxious affect. He is not  responding to internal stimuli. Endorses passive SI, denies HI or AVH. Was seeing double and things floating 3 weeks ago.  Past Psychiatric History: Reported history of depression Celexa in the past- States its doesn't do anything  Risk to Self: Endorses passive suicidal ideation without a plan or intent.  Contracts for safety at this time Risk to Others:   Prior Inpatient Therapy:   Prior Outpatient Therapy:    Past Medical History:  Past Medical History:  Diagnosis Date   Arthritis    Cervical spine disease    CHF (congestive heart failure)  (HCC)    Depression    GERD (gastroesophageal reflux disease)    Headache    migraines in the "80's"   Hyperlipidemia    Hypertension    Myocardial infarction [facility])    Peripheral vascular disease (HCC)    Stroke (HCC)    x 2    Past Surgical History:  Procedure Laterality Date   ABDOMINAL AORTOGRAM W/LOWER EXTREMITY Bilateral 09/15/2018   Procedure: ABDOMINAL AORTOGRAM W/LOWER EXTREMITY;  Surgeon: Nada Libman, MD;  Location: [facility] INVASIVE CV LAB;  Service: Cardiovascular;  Laterality: Bilateral;   ABDOMINAL AORTOGRAM W/LOWER EXTREMITY N/A 11/26/2019   Procedure: ABDOMINAL AORTOGRAM W/LOWER EXTREMITY;  Surgeon: Sherren Kerns, MD;  Location: [facility] INVASIVE CV LAB;  Service: Cardiovascular;  Laterality: N/A;   breast lump removal     BYPASS GRAFT  12/07/2019   RIGHT FEMORAL-ABOVE KNEE POPLITEAL ARTERY BYPASS GRAFT USING PROPATEN GRAFT (Right Leg Upper   CORONARY ARTERY BYPASS GRAFT     FEMORAL-POPLITEAL BYPASS GRAFT Right 12/07/2019   Procedure: RIGHT FEMORAL-ABOVE KNEE POPLITEAL ARTERY BYPASS GRAFT USING PROPATEN GRAFT;  Surgeon: Sherren Kerns, MD;  Location: [facility] OR;  Service: Vascular;  Laterality: Right;   INTRAOPERATIVE ARTERIOGRAM Right 12/07/2019   Procedure: Intra Operative Arteriogram;  Surgeon: Sherren Kerns, MD;  Location: [facility] OR;  Service: Vascular;  Laterality: Right;   LEFT HEART CATHETERIZATION WITH CORONARY/GRAFT ANGIOGRAM Right 05/01/2012   Procedure: LEFT HEART CATHETERIZATION WITH Isabel Caprice;  Surgeon: Ricki Rodriguez, MD;  Location: [facility] CATH LAB;  Service: Cardiovascular;  Laterality: Right;   LOWER EXTREMITY ANGIOGRAPHY Right 05/25/2020   Procedure: LOWER EXTREMITY ANGIOGRAPHY;  Surgeon: Cephus Shelling, MD;  Location: [facility] INVAS[facility] CV LAB;  Service: Cardiovascular;  Laterality: Right;   LOWER EXTREMITY ANGIOGRAPHY  05/26/2020   Procedure: Lower Extremity Angiography;  Surgeon: Maeola Harman, MD;  Location: [facility] INVASIVE CV LAB;  Service:  Cardiovascular;;   PERIPHERAL VASCULAR INTERVENTION Right 05/26/2020   Procedure: PERIPHERAL VASCULAR INTERVENTION;  Surgeon: Maeola Harman, MD;  Location: [facility] INVASIVE CV LAB;  Service: Cardiovascular;  Laterality: Right;  PERONEAL FEM-POP BYPASS GRAFT   triple bypass     Family History:  Family History  Problem Relation Age of Onset   Kidney disease Mother    Asthma Mother    Hypertension Father    Family Psychiatric  History: None per chart  Social History:  Social History   Substance and Sexual Activity  Alcohol Use Yes   Comment: occ     Social History   Substance and Sexual Activity  Drug Use Yes   Types: Marijuana   Comment: last time 12/05/2019    Social History   Socioeconomic History   Marital status: Married    Spouse name: Not on file   Number of children: Not on file   Years of education: Not on file   Highest education level: Not on file  Occupational History  Not on file  Tobacco Use   Smoking status: Former    Years: 45.00    Types: Cigarettes   Smokeless tobacco: Never  Vaping Use   Vaping Use: Never used  Substance and Sexual Activity   Alcohol use: Yes    Comment: occ   Drug use: Yes    Types: Marijuana    Comment: last time 12/05/2019   Sexual activity: Not on file  Other Topics Concern   Not on file  Social History Narrative   Not on file   Social Determinants of Health   Financial Resource Strain: Not on file  Food Insecurity: Not on file  Transportation Needs: Not on file  Physical Activity: Not on file  Stress: Not on file  Social Connections: Not on file   Additional Social History:    Allergies:   Allergies  Allergen Reactions   Other Other (See Comments)    Tomato- hives   Penicillins Hives and Other (See Comments)    Has patient had a PCN reaction causing immediate rash, facial/tongue/throat swelling, SOB or lightheadedness with hypotension: No Has patient had a PCN reaction causing severe rash involving  mucus membranes or skin necrosis: No Has patient had a PCN reaction that required hospitalization No Has patient had a PCN reaction occurring within the last 10 years: No If all of the above answers are "NO", then may proceed with Cephalosporin use.    Labs:  Results for orders placed or performed during the hospital encounter of 04/18/21 (from the past 48 hour(s))  CBC with Differential/Platelet     Status: None   Collection Time: 04/18/21 12:21 PM  Result Value Ref Range   WBC 5.2 4.0 - 10.5 K/uL   RBC 5.30 4.22 - 5.81 MIL/uL   Hemoglobin 14.3 13.0 - 17.0 g/dL   HCT 91.4 78.2 - 95.6 %   MCV 86.4 80.0 - 100.0 fL   MCH 27.0 26.0 - 34.0 pg   MCHC 31.2 30.0 - 36.0 g/dL   RDW 21.3 08.6 - 57.8 %   Platelets 157 150 - 400 K/uL   nRBC 0.0 0.0 - 0.2 %   Neutrophils Relative % 59 %   Neutro Abs 3.0 1.7 - 7.7 K/uL   Lymphocytes Relative 31 %   Lymphs Abs 1.6 0.7 - 4.0 K/uL   Monocytes Relative 9 %   Monocytes Absolute 0.5 0.1 - 1.0 K/uL   Eosinophils Relative 1 %   Eosinophils Absolute 0.0 0.0 - 0.5 K/uL   Basophils Relative 0 %   Basophils Absolute 0.0 0.0 - 0.1 K/uL   Immature Granulocytes 0 %   Abs Immature Granulocytes 0.02 0.00 - 0.07 K/uL    Comment: Performed at [facility], [address] [place], [place] [address]  Comprehensive metabolic panel     Status: Abnormal   Collection Time: 04/18/21 12:21 PM  Result Value Ref Range   Sodium 138 135 - 145 mmol/L   Potassium 4.3 3.5 - 5.1 mmol/L   Chloride 105 98 - 111 mmol/L   CO2 26 22 - 32 mmol/L   Glucose, Bld 93 70 - 99 mg/dL    Comment: Glucose reference range applies only to samples taken after fasting for at least 8 hours.   BUN 9 8 - 23 mg/dL   Creatinine, Ser 9.52 (H) 0.61 - 1.24 mg/dL   Calcium 9.3 8.9 - 84.1 mg/dL   Total Protein 6.6 6.5 - 8.1 g/dL   Albumin 3.7 3.5 - 5.0  g/dL   AST 26 15 - 41 U/L   ALT 19 0 - 44 U/L   Alkaline Phosphatase 111 38 - 126 U/L   Total Bilirubin 0.9 0.3 - 1.2 mg/dL   GFR,  Estimated 51 (L) >60 mL/min    Comment: (NOTE) Calculated using the CKD-EPI Creatinine Equation (2021)    Anion gap 7 5 - 15    Comment: Performed at [facility], [address] [place], [place] [address]  Troponin I (High Sensitivity)     Status: None   Collection Time: 04/18/21 12:21 PM  Result Value Ref Range   Troponin I (High Sensitivity) 9 <18 ng/L    Comment: (NOTE) Elevated high sensitivity troponin I (hsTnI) values and significant  changes across serial measurements may suggest ACS but many other  chronic and acute conditions are known to elevate hsTnI results.  Refer to the "Links" section for chest pain algorithms and additional  guidance. Performed at [facility], [address] [place], [place] [address]   Hemoglobin A1c     Status: Abnormal   Collection Time: 04/18/21 12:21 PM  Result Value Ref Range   Hgb A1c MFr Bld 6.2 (H) 4.8 - 5.6 %    Comment: (NOTE) Pre diabetes:          5.7%-6.4%  Diabetes:              >6.4%  Glycemic control for   <7.0% adults with diabetes    Mean Plasma Glucose 131.24 mg/dL    Comment: Performed at [facility], [address], [place], [place] [address]  Troponin I (High Sensitivity)     Status: None   Collection Time: 04/18/21  2:45 PM  Result Value Ref Range   Troponin I (High Sensitivity) 11 <18 ng/L    Comment: (NOTE) Elevated high sensitivity troponin I (hsTnI) values and significant  changes across serial measurements may suggest ACS but many other  chronic and acute conditions are known to elevate hsTnI results.  Refer to the "Links" section for chest pain algorithms and additional  guidance. Performed at [facility], [address], [place], [place] [address]   Protime-INR     Status: None   Collection Time: 04/18/21  4:19 PM  Result Value Ref Range   Prothrombin Time 13.1 11.4 - 15.2 seconds   INR 1.0 0.8 - 1.2    Comment: (NOTE) INR goal varies based on device and disease  states. Performed at [facility], [address] [place], [place] [address]   APTT     Status: None   Collection Time: 04/18/21  4:19 PM  Result Value Ref Range   aPTT 29 24 - 36 seconds    Comment: Performed at [facility], [address] [place], [place] [address]  Urinalysis, Routine w reflex microscopic Urine, Clean Catch     Status: None   Collection Time: 04/18/21  5:30 PM  Result Value Ref Range   Color, Urine YELLOW YELLOW   APPearance CLEAR CLEAR   Specific Gravity, Urine 1.016 1.005 - 1.030   pH 7.0 5.0 - 8.0   Glucose, UA NEGATIVE NEGATIVE mg/dL   Hgb urine dipstick NEGATIVE NEGATIVE   Bilirubin Urine NEGATIVE NEGATIVE   Ketones, ur NEGATIVE NEGATIVE mg/dL   Protein, ur NEGATIVE NEGATIVE mg/dL   Nitrite NEGATIVE NEGATIVE   Leukocytes,Ua NEGATIVE NEGATIVE    Comment: Performed at [facility], [address], [place], [place] [address]  HIV Antibody (routine testing w rflx)     Status: None   Collection Time: 04/18/21  6:50 PM  Result Value Ref Range   HIV Screen 4th Generation wRfx Non Reactive Non Reactive    Comment: Performed at [facility], [address] [place], [place] [address]  SARS CORONAVIRUS 2 (TAT 6-24 HRS) Nasopharyngeal Nasopharyngeal Swab     Status: None   Collection Time: 04/18/21  9:07 PM   Specimen: Nasopharyngeal Swab  Result Value Ref Range   SARS Coronavirus 2 NEGATIVE NEGATIVE    Comment: (NOTE) SARS-CoV-2 target nucleic acids are NOT DETECTED.  The SARS-CoV-2 RNA is generally detectable in upper and lower respiratory specimens during the acute phase of infection. Negative results do not preclude SARS-CoV-2 infection, do not rule out co-infections with other pathogens, and should not be used as the sole basis for treatment or other patient management decisions. Negative results must be combined with clinical observations, patient history, and epidemiological information. The expected result is  Negative.  Fact Sheet for Patients: HairSlick.no  Fact Sheet for Healthcare Providers: quierodirigir.com  This test is not yet approved or cleared by the [place] FDA and  has been authorized for detection and/or diagnosis of SARS-CoV-2 by FDA under an Emergency Use Authorization (EUA). This EUA will remain  in effect (meaning this test can be used) for the duration of the COVID-19 declaration under Se ction 564(b)(1) of the Act, 21 U.S.C. section 360bbb-3(b)(1), unless the authorization is terminated or revoked sooner.  Performed at [facility], [address] [place], [place] [address]   Lipid panel     Status: Abnormal   Collection Time: 04/19/21  1:46 AM  Result Value Ref Range   Cholesterol 226 (H) 0 - 200 mg/dL   Triglycerides 130 (H) <150 mg/dL   HDL 38 (L) >86 mg/dL   Total CHOL/HDL Ratio 5.9 RATIO   VLDL 47 (H) 0 - 40 mg/dL   LDL Cholesterol 578 (H) 0 - 99 mg/dL    Comment:        Total Cholesterol/HDL:CHD Risk Coronary Heart Disease Risk Table                     Men   Women  1/2 Average Risk   3.4   3.3  Average Risk       5.0   4.4  2 X Average Risk   9.6   7.1  3 X Average Risk  23.4   11.0        Use the calculated Patient Ratio above and the CHD Risk Table to determine the patient's CHD Risk.        ATP III CLASSIFICATION (LDL):  <100     mg/dL   Optimal  469-629  mg/dL   Near or Above                    Optimal  130-159  mg/dL   Borderline  528-413  mg/dL   High  >244     mg/dL   Very High Performed at [facility], [address] [place], [place] [address]   Basic metabolic panel     Status: Abnormal   Collection Time: 04/19/21  1:46 AM  Result Value Ref Range   Sodium 139 135 - 145 mmol/L   Potassium 3.6 3.5 - 5.1 mmol/L   Chloride 106 98 - 111 mmol/L  CO2 25 22 - 32 mmol/L   Glucose, Bld 116 (H) 70 - 99 mg/dL    Comment: Glucose reference range applies only to samples  taken after fasting for at least 8 hours.   BUN 14 8 - 23 mg/dL   Creatinine, Ser 5.62 (H) 0.61 - 1.24 mg/dL   Calcium 8.8 (L) 8.9 - 10.3 mg/dL   GFR, Estimated 49 (L) >60 mL/min    Comment: (NOTE) Calculated using the CKD-EPI Creatinine Equation (2021)    Anion gap 8 5 - 15    Comment: Performed at [facility], [address], [place], [place] [address]  TSH     Status: None   Collection Time: 04/19/21  1:46 AM  Result Value Ref Range   TSH 2.545 0.350 - 4.500 uIU/mL    Comment: Performed by a 3rd Generation assay with a functional sensitivity of <=0.01 uIU/mL. Performed at [facility], [address], [place], [place] [address]   Vitamin B12     Status: Abnormal   Collection Time: 04/19/21  1:46 AM  Result Value Ref Range   Vitamin B-12 176 (L) 180 - 914 pg/mL    Comment: (NOTE) This assay is not validated for testing neonatal or myeloproliferative syndrome specimens for Vitamin B12 levels. Performed at [facility], [address] [place], [place] [address]   Rapid urine drug screen (hospital performed)     Status: Abnormal   Collection Time: 04/19/21 11:18 AM  Result Value Ref Range   Opiates NONE DETECTED NONE DETECTED   Cocaine POSITIVE (A) NONE DETECTED   Benzodiazepines NONE DETECTED NONE DETECTED   Amphetamines NONE DETECTED NONE DETECTED   Tetrahydrocannabinol POSITIVE (A) NONE DETECTED   Barbiturates NONE DETECTED NONE DETECTED    Comment: (NOTE) DRUG SCREEN FOR MEDICAL PURPOSES ONLY.  IF CONFIRMATION IS NEEDED FOR ANY PURPOSE, NOTIFY LAB WITHIN 5 DAYS.  LOWEST DETECTABLE LIMITS FOR URINE DRUG SCREEN Drug Class                     Cutoff (ng/mL) Amphetamine and metabolites    1000 Barbiturate and metabolites    200 Benzodiazepine                 200 Tricyclics and metabolites     300 Opiates and metabolites        300 Cocaine and metabolites        300 THC                            50 Performed at [facility] Lab, [address], [place], [place] [address]   Basic metabolic panel     Status: Abnormal   Collection Time: 04/20/21  4:43 AM  Result Value Ref Range   Sodium 139 135 - 145 mmol/L   Potassium 4.2 3.5 - 5.1 mmol/L   Chloride 106 98 - 111 mmol/L   CO2 25 22 - 32 mmol/L   Glucose, Bld 101 (H) 70 - 99 mg/dL    Comment: Glucose reference range applies only to samples taken after fasting for at least 8 hours.   BUN 12 8 - 23 mg/dL   Creatinine, Ser 2.44 (H) 0.61 - 1.24 mg/dL   Calcium 9.0 8.9 - 01.0 mg/dL   GFR, Estimated 58 (L) >60 mL/min    Comment: (NOTE) Calculated using the CKD-EPI Creatinine Equation (2021)    Anion gap  8 5 - 15    Comment: Performed at [facility], [address] [place], [place] [address]    Current Facility-Administered Medications  Medication Dose Route Frequency Provider Last Rate Last Admin    stroke: mapping our early stages of recovery book   Does not apply Once Atway, Rayann N, DO       acetaminophen (TYLENOL) tablet 650 mg  650 mg Oral Q4H PRN Atway, Rayann N, DO   650 mg at 04/18/21 2017   Or   acetaminophen (TYLENOL) 160 MG/5ML solution 650 mg  650 mg Per Tube Q4H PRN Atway, Rayann N, DO       Or   acetaminophen (TYLENOL) suppository 650 mg  650 mg Rectal Q4H PRN Atway, Rayann N, DO       aspirin EC tablet 81 mg  81 mg Oral Daily Kirby-Graham, Beather ArbourKaren J, NP   81 mg at 04/20/21 0807   atorvastatin (LIPITOR) tablet 80 mg  80 mg Oral Daily Leda GauzeKirby-Graham, Karen J, NP   80 mg at 04/20/21 [id number]   cholecalciferol (VITAMIN D3) tablet 1,000 Units  1,000 Units Oral Daily Eliezer BottomAslam, Sadia, MD   1,000 Units at 04/20/21 0809   citalopram (CELEXA) tablet 10 mg  10 mg Oral Daily Aslam, Leanna SatoSadia, MD   10 mg at 04/20/21 0809   clopidogrel (PLAVIX) tablet 75 mg  75 mg Oral Daily Leda GauzeKirby-Graham, Karen J, NP   75 mg at 04/20/21 0809   enoxaparin (LOVENOX) injection 40 mg  40 mg Subcutaneous Q24H Atway, Rayann N, DO   40 mg at 04/19/21 1600   gabapentin (NEURONTIN) capsule 300 mg  300 mg  Oral TID Eliezer BottomAslam, Sadia, MD   300 mg at 04/20/21 0810   HYDROcodone-acetaminophen (NORCO) 10-325 MG per tablet 1 tablet  1 tablet Oral Q6H PRN Eliezer BottomAslam, Sadia, MD   1 tablet at 04/20/21 0808   pantoprazole (PROTONIX) EC tablet 40 mg  40 mg Oral Daily Aslam, Leanna SatoSadia, MD   40 mg at 04/20/21 [id number]   senna-docusate (Senokot-S) tablet 1 tablet  1 tablet Oral QHS PRN Atway, Rayann N, DO        Musculoskeletal: Strength & Muscle Tone: within normal limits Gait & Station: normal Patient leans: N/A            Psychiatric Specialty Exam:  Presentation  General Appearance: Appropriate for Environment  Eye Contact:Fair  Speech:Slurred  Speech Volume:Normal  Handedness: No data recorded  Mood and Affect  Mood:Anxious; Depressed; Hopeless  Affect:Congruent; Constricted   Thought Process  Thought Processes:Coherent  Descriptions of Associations:Intact  Orientation:Full (Time, Place and Person)  Thought Content:WDL  History of Schizophrenia/Schizoaffective disorder:No data recorded Duration of Psychotic Symptoms:No data recorded Hallucinations:Hallucinations: None (Was seeing double and things floating 3 weeks ago.)  Ideas of Reference:None  Suicidal Thoughts:Suicidal Thoughts: Yes, Passive SI Passive Intent and/or Plan: Without Plan  Homicidal Thoughts:Homicidal Thoughts: No   Sensorium  Memory:Immediate Good; Recent Fair; Remote Fair  Judgment:Fair  Insight:Good   Executive Functions  Concentration:Fair  Attention Span:Fair  Recall:Fair  Fund of Knowledge:Fair  Language:Fair   Psychomotor Activity  Psychomotor Activity:Psychomotor Activity: Normal   Assets  Assets:Communication Skills; Desire for Improvement; Resilience; Social Support   Sleep  Sleep:Sleep: Fair   Physical Exam: Physical Exam Vitals and nursing note reviewed.  Constitutional:      General: He is not in acute distress.    Appearance: Normal appearance. He is not  ill-appearing, toxic-appearing or diaphoretic.  HENT:  Head: Normocephalic and atraumatic.  Pulmonary:     Effort: Pulmonary effort is normal.  Neurological:     General: No focal deficit present.     Mental Status: He is alert and oriented to person, place, and time.   Review of Systems  Constitutional:  Negative for chills and fever.  Respiratory:  Negative for cough and shortness of breath.   Cardiovascular:  Negative for chest pain.  Gastrointestinal:  Negative for constipation, diarrhea, nausea and vomiting.  Skin:  Negative for rash.  Neurological:  Positive for sensory change and focal weakness. Negative for headaches.  Psychiatric/Behavioral:  Positive for depression and suicidal ideas. The patient is nervous/anxious and has insomnia.        Passive suicidal thoughts.  No intent or plan.  Contracts for safety at this time.  Blood pressure (!) 171/76, pulse (!) 59, temperature 97.8 F (36.6 C), temperature source Oral, resp. rate 16, height 5\' 7"  (1.702 m), weight 81.6 kg, SpO2 99 %. Body mass index is 28.19 kg/m.  Treatment Plan Summary: Plan   -Patient does not meet criteria for inpatient hospitalization.  Patient has passive suicidal thoughts without intent or plan likely due to his current housing situation.  Patient contracts for safety at this time. Pt is recommended for CIR. CSW can help him provide resources for housing before discharge.  -Increase Celexa to 20 mg daily. -Restart trazodone 100 mg nightly as needed for insomnia. -Monitor for suicidal thoughts. -Monitor for side effects. -CSW can help him provide housing resources before discharge. -Psychiatry will sign off.  Disposition: Patient does not meet criteria for psychiatric inpatient admission. Supportive therapy provided about ongoing stressors. Discussed crisis plan, support from social network, calling 911, coming to the Emergency Department, and calling Suicide Hotline.  , MD 04/20/2021  11:35 AM

## 2021-04-20 NOTE — Progress Notes (Signed)
HD#2 SUBJECTIVE:  Patient Summary: Marcus [name] is a 70 year old person multiple arterial clots history including 3 myocardial infarctions, stroke in 2017, an PAD who is admitted for acute strokes of R pons and L occipital lobes.   Overnight Events: No acute events reported.   Interim History: This is hospital day 2 for Marcus [name] who was seen and evaluated at the bedside this morning. He is resting comfortably in bed. He continues to have left sided deficits and notes that he is not able to brush his teeth or walk still. Patient expressed passive suicidal thoughts yesterday afternoon and worsening depression after his stroke. When asked about this he states "I'm ready to go. I don't want to suffer". He denies having an active plan to commit suicide at this time, however, he says "I don't want to be a burden to anybody." He reports that his son has made him feel like a burden "in so many words". He has had similar thoughts in the past; however, notes that he has not done this in the past, in part due to his grandkids. He still does endorse that he needs them but "I don't want to be a burden".   OBJECTIVE:  Vital Signs: Vitals:   04/19/21 1934 04/19/21 2013 04/20/21 0001 04/20/21 0427  BP:  (!) 189/77 (!) 154/85 (!) 148/84  Pulse:  (!) 56 (!) 55 (!) 55  Resp: 20 18 18 18   Temp:  97.8 F (36.6 C) 97.6 F (36.4 C) 97.6 F (36.4 C)  TempSrc:  Oral    SpO2:  98% 97% 98%  Weight:      Height:       Supplemental O2: Room Air SpO2: 98 %  Filed Weights   04/18/21 1139  Weight: 81.6 kg     Intake/Output Summary (Last 24 hours) at 04/20/2021 06/20/2021 Last data filed at 04/19/2021 2015 Gross per 24 hour  Intake --  Output 450 ml  Net -450 ml   Net IO Since Admission: -450 mL [04/20/21 0628]  Physical Exam: General: Pleasant, sad appearing gentleman in bed. No acute distress. CV: RRR. No murmurs, rubs, or gallops. Mild ankle edema Pulmonary: Lungs CTAB. Normal effort. No wheezing or  rales. Extremities: Palpable radial and 1+ DP pulses. Normal ROM. Skin: Warm and dry. No obvious rash or lesions. Neuro: A&Ox3. Moves all extremities. Extremity strength preserved bilaterally. Paresthesias in V1 and V2, otherwise CN II-XII intact. Paresthesias reported in LUE and LLE. Psych: Depressed mood and affect      ASSESSMENT/PLAN:  Assessment: Principal Problem:   Acute cerebral infarction (HCC) Active Problems:   Hypertension   HLD (hyperlipidemia)   CKD (chronic kidney disease) stage 3, GFR 30-59 ml/min (HCC)   Acute CVA (cerebrovascular accident) (HCC)   Plan:  Acute ischemic strokes of R pons and L occipital lobe Patient recommended to receive dual antiplatelet therapy with aspirin and Plavix for 3 weeks, followed by aspirin therapy alone thereafter. Will also continue on 80 mg Lipitor. - Frequent neuro checks and continuing on telemetry - 30 day cardiac event monitor on discharge to rule out Afib  Depression with suicidal thoughts Patient has a history of depression and notes that he has had suicidal thoughts recently and in the past, and feels like he is a burden on his family as noted in the subjective above. Does not have an active plan to commit suicide. Denies any homicidal thoughts. Started the patient on low dose Celexa 10mg  yesterday. - Psych consulted,  appreciate recommendations  - Increased Celexa to 20 mg daily  - Started on Trazodone 100 mg nightly  - Will consult TOC regarding future housing situation  Prior to admission living arrangement: son's home vs new housing situation? Anticipated discharge location: CIR Barriers to discharge: CIR placement DVT prophylaxis: Xarelto 10mg  started on 8/12 Dispo: 1-2 days pending CIR  Signature: Francetta Ilg, D.O.  Internal Medicine Resident, PGY-1 10/12 Internal Medicine Residency  Pager: [phone] 6:28 AM, 04/20/2021   Please contact the on call pager after 5 pm and on weekends at  [phone].

## 2021-04-21 LAB — BASIC METABOLIC PANEL
Anion gap: 8 (ref 5–15)
BUN: 9 mg/dL (ref 8–23)
CO2: 25 mmol/L (ref 22–32)
Calcium: 8.9 mg/dL (ref 8.9–10.3)
Chloride: 104 mmol/L (ref 98–111)
Creatinine, Ser: 1.38 mg/dL — ABNORMAL HIGH (ref 0.61–1.24)
GFR, Estimated: 55 mL/min — ABNORMAL LOW (ref >60)
Glucose, Bld: 100 mg/dL — ABNORMAL HIGH (ref 70–99)
Potassium: 4 mmol/L (ref 3.5–5.1)
Sodium: 137 mmol/L (ref 135–145)

## 2021-04-21 NOTE — Progress Notes (Signed)
Subjective:  Marcus [name] is a 70 year old person with multiple arterial clots history including three myocardial infarctions, stroke in 2017 and PAD who is admitted for acute strokes of the R pons and L occipital lobes with left sided weakness and paresthesias.   Marcus [name] notes that his L heel began hurting this morning. He told his nurse, received prn pain control with improvement, and has been elevating his heel off the bed with a pillow under the calf.  Marcus [name] also notes that he is willing to do "whatever it takes" to get his strength back, "as long as no one intends to put [him] in a long term nursing home." Pt noted he is willing to go to a skilled nursing facility for acute rehabilitation.   Marcus [name] has endorsed passive suicidal ideation without intent or plan. He spoke with psychiatry yesterday. He notes today that he still has "negative thoughts in [his] head."   Objective:  Vital signs in last 24 hours: Vitals:   04/20/21 2200 04/20/21 2351 04/21/21 0337 04/21/21 0908  BP: (!) 149/78 139/84 135/69 (!) 147/87  Pulse: (!) 56 (!) 57 (!) 52 (!) 58  Resp:  17 17 18   Temp: 97.8 F (36.6 C) 98.1 F (36.7 C) 98 F (36.7 C) (!) 97.4 F (36.3 C)  TempSrc: Axillary Oral  Oral  SpO2: 100% 97% 97% 98%  Weight:      Height:       Weight change:   Intake/Output Summary (Last 24 hours) at 04/21/2021 1314 Last data filed at 04/20/2021 2200 Gross per 24 hour  Intake 60 ml  Output --  Net 60 ml   General: Pleasant, gentleman sitting up in bed. No acute distress. CV: RRR. No murmurs, rubs, or gallops. No LE edema Pulmonary: Lungs CTAB. Normal effort. No wheezing or rales. Extremities: Palpable radial and weak 1+ DP pulses. L heel tender to touch. Skin: Warm and dry. No obvious rash or lesions, including none specifically on the L heel. Neuro: A&Ox3. Moves all extremities. 4-5/5 LLE strength. Endorses warm/tingling paresthesias in Left V1, Left V2, LUE and LLE; otherwise  CN I-XII intact. Psych: Mildly depressed mood with demonstration of situational-appropriate positive affect range   Assessment/Plan:  Principal Problem:   Acute cerebral infarction [facility]) Active Problems:   Hypertension   HLD (hyperlipidemia)   CKD (chronic kidney disease) stage 3, GFR 30-59 ml/min (HCC)   Acute CVA (cerebrovascular accident) (HCC)   Moderate episode of recurrent major depressive disorder (HCC)  Acute ischemic strokes of R pons and L occipital lobe Presented with acute left sided weakness and numbness which evolved into warm/tingling paresthesias. 04/18/21 MRI revealed acute infarcts and chronic small vessel infarcts. Out of tPA window. 04/18/21 CTA Head and Neck showed 60% stenosis of R carotid bulb and focal high grade stenosis of proximal L inferior M2, also R PCA w mild stenosis. Pt with 45+ pack year history and not adherent to risk reducing medications including Plavix, ASA, and Lipitor. 04/19/21 Echo with EF 55-60% and grade 1 diastolic dysfunction, and no intracardiac source of embolism detected. LDL at 140 and A1C at 6.2. Neurology recommended DAPT with 75mg  Plavix and 81mg  ASA for 3 weeks, followed by 81mg  ASA. -Continue 80mg  Lipitor as well -Discharge with 30 day cardiac event monitor to rule out Afib  Depression with passive suicidal thoughts Patient with history of depression, and acutely worsened after this most recent stroke. Describes "not wanting to be a burden," to his son  and family who he has been living with. No active plan for suicide. Was evaluated by Pscyh and increased to 20mg  Celexa and started on 100mg  Trazodone nightly.  Prior to admission living arrangement: son's home Anticipated discharge location: SNF for acute rehab Barriers to discharge: medically stable, needs SNF placement Dispo: 1-2 days    LOS: 2 days   , Medical Student 04/21/2021, 1:14 PM

## 2021-04-22 LAB — MAGNESIUM: Magnesium: 2 mg/dL (ref 1.7–2.4)

## 2021-04-22 MED ORDER — KETOROLAC TROMETHAMINE 15 MG/ML IJ SOLN
15.0000 mg | Freq: Once | INTRAMUSCULAR | Status: AC
  Administered 2021-04-22: 15 mg via INTRAVENOUS
  Filled 2021-04-22: qty 1

## 2021-04-22 NOTE — TOC Initial Note (Addendum)
Transition of Care [facility]) - Initial/Assessment Note    Patient Details  Name: Marcus [name] MRN: [id number] Date of Birth: 10-May-1951  Transition of Care [facility]) CM/SW Contact:    Eduard Roux, LCSW Phone Number: 04/22/2021, 9:35 AM  Clinical Narrative:                  CSW spoke with patient via phone. CSW introduced self and explained role. Patient states he was residing with his son. He shared his son is unable to care for him and he can not go back there to stay. He does not want to be a burden to his son or his family. Patient state he does not want to stay in nursing home or ALF, states " I will live on the streets first". He states " my son has small children and I do not want them to see me like this either". Patient reports he receives disability. He reports having housing but gave it up to assist his daughter with his grandchildren. Patient is agreeable to short term rehab at [facility]. CSW explained the SNF process and possible placement barriers. CSW encouraged patient to continue to explore his housing options- CSW can only provide resources but can not secure housing. Patient states understanding.   CSW discussed with patient cocaine and alcohol use- he states "I have a lot going on". He reports not using drugs or alcohol often but only when in pain or to calm his anxiety. Patient was reluctant but CSW  suggested healthier alternative coping mechanism and encouraged him to utilize resources provided.   CSW provided substance abuse,mental health and housing resources.  Antony Blackbird, MSW, LCSW Clinical Social Worker     Expected Discharge Plan: Skilled Nursing Facility Barriers to Discharge: Awaiting State Approval [facility]), Active Substance Use - Placement, SNF Pending bed offer   Patient Goals and CMS Choice   CMS Medicare.gov Compare Post Acute Care list provided to:: Patient Choice offered to / list presented to : Patient  Expected Discharge Plan and  Services Expected Discharge Plan: Skilled Nursing Facility In-house Referral: Clinical Social Work Discharge Planning Services: CM Consult Post Acute Care Choice: IP Rehab Living arrangements for the past 2 months: Single Family Home                                      Prior Living Arrangements/Services Living arrangements for the past 2 months: Single Family Home Lives with:: Adult Children, Self Patient language and need for interpreter reviewed:: No Do you feel safe going back to the place where you live?: Yes      Need for Family Participation in Patient Care: Yes (Comment) Care giver support system in place?: Yes (comment) Current home services: DME (cane) Criminal Activity/Legal Involvement Pertinent to Current Situation/Hospitalization: No - Comment as needed  Activities of Daily Living Home Assistive Devices/Equipment: None ADL Screening (condition at time of admission) Patient's cognitive ability adequate to safely complete daily activities?: Yes Is the patient deaf or have difficulty hearing?: No Does the patient have difficulty seeing, even when wearing glasses/contacts?: No Does the patient have difficulty concentrating, remembering, or making decisions?: No Patient able to express need for assistance with ADLs?: Yes Does the patient have difficulty dressing or bathing?: No Independently performs ADLs?: No Communication: Independent Dressing (OT): Independent Grooming: Needs assistance Is this a change from baseline?: Pre-admission baseline Feeding: Independent Bathing: Needs assistance Is  this a change from baseline?: Pre-admission baseline Toileting: Needs assistance Is this a change from baseline?: Pre-admission baseline In/Out Bed: Needs assistance Is this a change from baseline?: Change from baseline, expected to last >3 days Walks in Home: Needs assistance Is this a change from baseline?: Change from baseline, expected to last >3 days Does the  patient have difficulty walking or climbing stairs?: Yes Weakness of Legs: Left Weakness of Arms/Hands: Left  Permission Sought/Granted Permission sought to share information with : Family Supports Permission granted to share information with : Yes, Verbal Permission Granted  Share Information with NAME: Martiona and Dedrick Heffner  Permission granted to share info w AGENCY: SNFs  Permission granted to share info w Relationship: daughter & son     Emotional Assessment Appearance:: Appears stated age Attitude/Demeanor/Rapport: Engaged Affect (typically observed): Pleasant, Accepting, Appropriate Orientation: : Oriented to Self, Oriented to Place, Oriented to  Time, Oriented to Situation Alcohol / Substance Use: Alcohol Use, Illicit Drugs Psych Involvement: No (comment)  Admission diagnosis:  CVA (cerebrovascular accident due to intracerebral hemorrhage) (HCC) [I61.9] Stroke, acute, thrombotic (HCC) [I63.9] Acute CVA (cerebrovascular accident) [facility]) [I63.9] Patient Active Problem List   Diagnosis Date Noted   Moderate episode of recurrent major depressive disorder (HCC)    Acute CVA (cerebrovascular accident) (HCC) 04/19/2021   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   PAD (peripheral artery disease) (HCC) 12/07/2019   Peripheral vascular disease (HCC) 12/07/2019   Acute combined systolic and diastolic CHF, NYHA class 3 (HCC) 07/16/2016   Acute cerebral infarction (HCC) 07/16/2016   Benign essential HTN 07/16/2016   HLD (hyperlipidemia) 07/16/2016   Non compliance w medication regimen 07/16/2016   CKD (chronic kidney disease) stage 3, GFR 30-59 ml/min (HCC) 07/16/2016   Hypertension 03/11/2011   CAD (coronary artery disease) 03/11/2011   PCP:  Oneita Hurt, No Pharmacy:   [id number] [place] Otto, [place] - [address] AT [facility] [address] [place] [address] Phone: [phone] Fax: [phone]     Social Determinants of Health (SDOH) Interventions     Readmission Risk Interventions Readmission Risk Prevention Plan 12/09/2019  Transportation Screening Complete  PCP or Specialist Appt within 5-7 Days Complete  Home Care Screening Complete  Medication Review (RN CM) Complete  Some recent data might be hidden

## 2021-04-22 NOTE — Progress Notes (Signed)
Notified Dr Ned Card that tele called to report pt having lots of PVCs and Bigeminy.  He is asleep with no signs of distress.  Did awaken easily and denied any chest discomfort, SOB, dizziness, lightheadedness, etc.  Dr noted but no new orders at this time.

## 2021-04-22 NOTE — NC FL2 (Signed)
[place] MEDICAID FL2 LEVEL OF CARE SCREENING TOOL     IDENTIFICATION  Patient Name: CARTEL MAUSS Birthdate: 06/23/1951 Sex: male Admission Date (Current Location): 04/18/2021  [facility] and [place] Number:  Producer, television/film/video and Address:  [facility], 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6 kg) Height:  5\' 7"  (170.2 cm)  BEHAVIORAL SYMPTOMS/MOOD NEUROLOGICAL BOWEL NUTRITION STATUS      Continent Diet (please see discharge summary)  AMBULATORY STATUS COMMUNICATION OF NEEDS Skin   Supervision Verbally Normal                       Personal Care Assistance Level of Assistance  Bathing, Dressing, Feeding Bathing Assistance: Limited assistance Feeding assistance: Independent Dressing Assistance: Limited  assistance     Functional Limitations Info  Sight, Hearing, Speech Sight Info: Impaired (wears glasses) Hearing Info: Adequate Speech Info: Adequate    SPECIAL CARE FACTORS FREQUENCY  PT (By licensed PT), OT (By licensed OT)     PT Frequency: 5x per week OT Frequency: 5x per week            Contractures Contractures Info: Not present    Additional Factors Info  Code Status, Allergies Code Status Info: DNR Allergies Info: Tomato,Penicillins           Current Medications (04/22/2021):  This is the current hospital active medication list Current Facility-Administered Medications  Medication Dose Route Frequency Provider Last Rate Last Admin   acetaminophen (TYLENOL) tablet 650 mg  650 mg Oral Q4H PRN Atway, Rayann N, DO   650 mg at 04/18/21 2017   Or   acetaminophen (TYLENOL) 160 MG/5ML solution 650 mg  650 mg Per Tube Q4H PRN Atway, Rayann N, DO       Or   acetaminophen (TYLENOL) suppository 650 mg  650 mg Rectal Q4H PRN Atway, Rayann N, DO       aspirin EC tablet 81 mg  81 mg Oral Daily Kirby-Graham, 2018, NP   81 mg at 04/22/21 1056   atorvastatin (LIPITOR) tablet 80 mg  80 mg Oral Daily 04/24/21, NP   80 mg at 04/22/21 1056   cholecalciferol (VITAMIN D3) tablet 1,000 Units  1,000 Units Oral Daily 04/24/21, MD   1,000 Units at  04/22/21 1056   citalopram (CELEXA) tablet 20 mg  20 mg Oral Daily Doda, Vandana, MD   20 mg at 04/22/21 1056   clopidogrel (PLAVIX) tablet 75 mg  75 mg Oral Daily Kirby-Graham, Beather Arbour, NP   75 mg at 04/22/21 1056   gabapentin (NEURONTIN) capsule 300 mg  300 mg Oral TID Eliezer Bottom, MD   300 mg at 04/22/21 1056   HYDROcodone-acetaminophen (NORCO) 10-325 MG per tablet 1 tablet  1 tablet Oral Q6H PRN Eliezer Bottom, MD   1 tablet at 04/22/21 0737   pantoprazole (PROTONIX) EC tablet 40 mg  40 mg Oral Daily Aslam, Leanna Sato, MD   40 mg at 04/22/21 1056   rivaroxaban (XARELTO) tablet 10 mg  10 mg Oral Daily Atway, Rayann N, DO   10 mg at  04/22/21 1056   senna-docusate (Senokot-S) tablet 1 tablet  1 tablet Oral QHS PRN Atway, Rayann N, DO       traZODone (DESYREL) tablet 100 mg  100 mg Oral QHS PRN Karsten Ro, MD   100 mg at 04/21/21 2129     Discharge Medications: Please see discharge summary for a list of discharge medications.  Relevant Imaging Results:  Relevant Lab Results:   Additional Information SSN [id number] patient states has received covid vaccine but no booster shot.  Eduard Roux, LCSW

## 2021-04-22 NOTE — Progress Notes (Addendum)
HD#4 SUBJECTIVE:  Patient Summary: Marcus [name] is a 70 year old person multiple arterial clots history including 3 myocardial infarctions, stroke in 2017, an PAD who is admitted for acute strokes of R pons and L occipital lobes.   Overnight Events: No acute events overnight  Interim History: This is hospital day 4 for Marcus [name] who was seen and evaluated at the bedside this morning. He continues to endorse some left heel pain and has kept his foot propped up on a pillow. Pain is moderately well controlled with hydrocodone. Patient states he is still unable to use his left hand for activities such as writing or brushing his teeth. He is motivated to build up his strength and is agreeable to go to a SNF.   OBJECTIVE:  Vital Signs: Vitals:   04/21/21 1333 04/21/21 1727 04/21/21 2011 04/21/21 2358  BP: (!) 141/79 (!) 145/83 139/72 133/63  Pulse: 86 92 76 70  Resp: 18 16 17 17   Temp: 98.2 F (36.8 C) 98 F (36.7 C) 98.1 F (36.7 C) 98.3 F (36.8 C)  TempSrc: Oral  Oral   SpO2: 96% 98% 97% 93%  Weight:      Height:       Supplemental O2: Room Air SpO2: 93 %  Filed Weights   04/18/21 1139  Weight: 81.6 kg    No intake or output data in the 24 hours ending 04/22/21 04/24/21 Net IO Since Admission: -400 mL [04/22/21 0638]  Physical Exam: General: Pleasant, gentleman sitting up in bed. No acute distress. CV: RRR. No murmurs, rubs, or gallops. No LE edema Pulmonary: Lungs CTAB. Normal effort. No wheezing or rales. Extremities: Palpable radial and weak 1+ DP pulses. L heel and ankle tender to touch. Skin: Warm and dry. No obvious rash or lesions, including none specifically on the L heel. Neuro: A&Ox3. Moves all extremities. 4-5/5 LLE strength. Endorses warm/tingling paresthesias in Left V1, Left V2, LUE and LLE; otherwise CN I-XII intact. Psych: Mildly depressed mood with demonstration of situational-appropriate positive affect range    ASSESSMENT/PLAN:   Assessment: Principal Problem:   Acute cerebral infarction (HCC) Active Problems:   Hypertension   HLD (hyperlipidemia)   CKD (chronic kidney disease) stage 3, GFR 30-59 ml/min (HCC)   Acute CVA (cerebrovascular accident) (HCC)   Moderate episode of recurrent major depressive disorder (HCC)   Plan: #Acute ischemic strokes of R pons and L occipital lobe Patient recommended to receive dual antiplatelet therapy with aspirin and Plavix for 3 weeks, followed by aspirin therapy alone thereafter. Will also continue on 80 mg Lipitor. - Continue meds as above - Discharge with 30 day cardiac event monitor to rule out Afib - Medically stable for discharge, SNF placement still pending   #Depression with passive suicidal thoughts Patient with history of depression, which acutely worsened after this most recent stroke. He describes "not wanting to be a burden," to his son and family who he has been living with. Endorses some passive suicidal thoughts, but has no active plan for suicide. Was evaluated by Pscyh and increased to 20mg  Celexa and started on 100mg  Trazodone nightly. -Continue meds as above  #Left heel pain Patient continues to endorse left heel pain thought to be secondary to stage 1 pressure wound. He is still having breakthrough pain with hydrocodone and he continues to have his foot propped up on a soft pillow. He also endorses some tenderness up into his ankle/achilles tendon. Could also represent a heel spur vs achilles tendonitis vs  plantar fasciitis. - Continue pain management with hydrocodone prn - Continue to elevate foot off bed - Given 1 dose of Toradol  - Could consider xray of left foot/ankle if pain does not improve   Best Practice: Diet: Regular diet IVF: Fluids: none VTE: rivaroxaban (XARELTO) tablet 10 mg Start: 04/20/21 1445 Code: DNR Therapy Recs: CIR vs SNF, DME: none DISPO: Anticipated discharge in 1-2 days to Skilled nursing facility pending  placement  .  Signature: Elza Rafter, D.O.  Internal Medicine Resident, PGY-1 [facility] Res[facility]  Pager: [phone] 6:38 AM, 04/22/2021   Please contact the on call pager after 5 pm and on weekends at [phone].

## 2021-04-22 NOTE — Plan of Care (Signed)
  Problem: Education: Goal: Knowledge of General Education information will improve Description: Including pain rating scale, medication(s)/side effects and non-pharmacologic comfort measures Outcome: Progressing   Problem: Health Behavior/Discharge Planning: Goal: Ability to manage health-related needs will improve Outcome: Progressing   Problem: Clinical Measurements: Goal: Ability to maintain clinical measurements within normal limits will improve Outcome: Progressing Goal: Will remain free from infection Outcome: Progressing Goal: Diagnostic test results will improve Outcome: Progressing Goal: Respiratory complications will improve Outcome: Progressing Goal: Cardiovascular complication will be avoided Outcome: Progressing   Problem: Activity: Goal: Risk for activity intolerance will decrease Outcome: Progressing   Problem: Nutrition: Goal: Adequate nutrition will be maintained Outcome: Progressing   Problem: Coping: Goal: Level of anxiety will decrease Outcome: Progressing   Problem: Elimination: Goal: Will not experience complications related to bowel motility Outcome: Progressing Goal: Will not experience complications related to urinary retention Outcome: Progressing   Problem: Pain Managment: Goal: General experience of comfort will improve Outcome: Progressing   Problem: Safety: Goal: Ability to remain free from injury will improve Outcome: Progressing   Problem: Skin Integrity: Goal: Risk for impaired skin integrity will decrease Outcome: Progressing   Problem: Education: Goal: Knowledge of secondary prevention will improve Outcome: Progressing Goal: Knowledge of patient specific risk factors addressed and post discharge goals established will improve Outcome: Progressing   Problem: Self-Care: Goal: Ability to participate in self-care as condition permits will improve Outcome: Progressing

## 2021-04-23 ENCOUNTER — Inpatient Hospital Stay (HOSPITAL_COMMUNITY): Payer: Medicare HMO

## 2021-04-23 DIAGNOSIS — I639 Cerebral infarction, unspecified: Secondary | ICD-10-CM | POA: Diagnosis not present

## 2021-04-23 LAB — BASIC METABOLIC PANEL
Anion gap: 6 (ref 5–15)
BUN: 13 mg/dL (ref 8–23)
CO2: 27 mmol/L (ref 22–32)
Calcium: 8.7 mg/dL — ABNORMAL LOW (ref 8.9–10.3)
Chloride: 105 mmol/L (ref 98–111)
Creatinine, Ser: 1.71 mg/dL — ABNORMAL HIGH (ref 0.61–1.24)
GFR, Estimated: 43 mL/min — ABNORMAL LOW (ref >60)
Glucose, Bld: 124 mg/dL — ABNORMAL HIGH (ref 70–99)
Potassium: 4 mmol/L (ref 3.5–5.1)
Sodium: 138 mmol/L (ref 135–145)

## 2021-04-23 LAB — SARS CORONAVIRUS 2 BY RT PCR (HOSPITAL ORDER, PERFORMED IN ~~LOC~~ HOSPITAL LAB): SARS Coronavirus 2: POSITIVE — AB

## 2021-04-23 MED ORDER — POLYETHYLENE GLYCOL 3350 17 G PO PACK
17.0000 g | PACK | Freq: Every day | ORAL | Status: DC
  Administered 2021-04-23 – 2021-05-03 (×8): 17 g via ORAL
  Filled 2021-04-23 (×9): qty 1

## 2021-04-23 MED ORDER — SENNOSIDES-DOCUSATE SODIUM 8.6-50 MG PO TABS
2.0000 | ORAL_TABLET | Freq: Every day | ORAL | Status: DC
  Administered 2021-04-23 – 2021-04-24 (×2): 2 via ORAL
  Filled 2021-04-23 (×2): qty 2

## 2021-04-23 NOTE — Care Management Important Message (Signed)
Important Message  Patient Details  Name: Marcus [name] MRN: [id number] Date of Birth: 07/12/1951   Medicare Important Message Given:  Yes     Dorena Bodo 04/23/2021, 4:31 PM

## 2021-04-23 NOTE — TOC CAGE-AID Note (Signed)
Transition of Care [facility]) - CAGE-AID Screening   Patient Details  Name: Marcus [name] MRN: [id number] Date of Birth: Mar 06, 1951  Transition of Care [facility]) CM/SW Contact:    Lossie Faes Tarpley-Carter, LCSWA Phone Number: 04/23/2021, 3:03 PM   Clinical Narrative: Pt participated in Cage-Aid.  Pt admitted to substance use, but states no ETOH use.  Pt was offered resources, pt accepted.  CSW will provide pt with resources and attach to dc summary.  Nayali Talerico Tarpley-Carter, MSW, LCSW-A Pronouns:  She/Her/Hers [facility]itions [facility] Clinical Social Worker Direct Number:  [phone] [email or web address]   CAGE-AID Screening:    Have You Ever Felt You Ought to Cut Down on Your Drinking or Drug Use?: Yes Have People Annoyed You By [employer] Your Drinking Or Drug Use?: No Have You Felt Bad Or Guilty About Your Drinking Or Drug Use?: Yes Have You Ever Had a Drink or Used Drugs First Thing In The Morning to Steady Your Nerves or to Get Rid of a Hangover?: No CAGE-AID Score: 2  Substance Abuse Education Offered: Yes  Substance abuse interventions: Transport planner

## 2021-04-23 NOTE — Progress Notes (Signed)
Subjective:  Marcus [name] is a 70 year old person with a history of multiple arterial clots including three prior myocardial infarctions, prior stroke in 2017, and peripheral arterial disease who presented with acute L sided weakness and paresthesias and MRI demonstrated acute infarcts of the R pons and L occipital lobe.   Marcus [name] notes that he is "doing alright" this morning. He continues to have pain in his left ankle, now describing pain along his ankle joint line. He took two Norco 10-325 yesterday which provided pain relief for 1.5 hours at a time. He denies any worsening of his pain and has not fallen since the morning of his stroke 8/10.  He also notes that he's "getting sick," and endorses nasal congestion and a new cough. Notes the cough has been productive but has swallowed the phlegm and can't describe what it looks like. He has not felt subjectively feverish. Denies any other concerns relatedly.  He also notes that his left facial warmness/tingling has resolved since Saturday. He is however continuing to be bothered by left arm and hand numbness/tingling.  Objective:  Vital signs in last 24 hours: Vitals:   04/22/21 1707 04/22/21 2000 04/23/21 0000 04/23/21 0400  BP: (!) 152/80 128/66 130/68 137/70  Pulse: 98 85 86 78  Resp: 18     Temp: 98.5 F (36.9 C) 98.3 F (36.8 C) 98.4 F (36.9 C) 98.4 F (36.9 C)  TempSrc: Oral Oral Oral Oral  SpO2: 96% 95% 96% 94%  Weight:      Height:       Weight change:   Intake/Output Summary (Last 24 hours) at 04/23/2021 0857 Last data filed at 04/22/2021 1710 Gross per 24 hour  Intake 588 ml  Output 150 ml  Net 438 ml   General: Pleasant gentleman sitting up comfortably in bed. No acute distress. CV: RRR. No murmurs, rubs, or gallops. No LE edema Extremities: Palpable radial and 1+ DP pulses. Normal ROM. Skin: Warm and dry. No obvious rash or lesions. Neuro: A&Ox3. Moves all extremities. Paresthesias in LUE. Strength 4+/5 in  LUE.  Psych: Normal mood and affect   Assessment/Plan:  Principal Problem:   Acute cerebral infarction (HCC) Active Problems:   Hypertension   HLD (hyperlipidemia)   CKD (chronic kidney disease) stage 3, GFR 30-59 ml/min (HCC)   Acute CVA (cerebrovascular accident) (HCC)   Moderate episode of recurrent major depressive disorder (HCC)  Acute ischemic strokes of R pons and L occipital lobe Presented with acute left sided weakness and numbness which evolved into warm/tingling paresthesias. 04/18/21 MRI demonstrated acute infarcts and chronic small vessel infarcts. Out of tPA window. 04/18/21 CTA Head and Neck showed 60% stenosis of R carotid bulb and focal high grade stenosis of proximal L inferior M2, also R PCA w mild stenosis. Pt with 45+ pack year history and not adherent to risk-reducing medications including Plavix, ASA, and Lipitor. 04/19/21 Echo with EF 55-60% and grade 1 diastolic dysfunction and no intracardiac source of embolism detected. LDL at 140 and A1C at 6.2. Neurology recommended DAPT with 75mg  Plavix and 81mg  ASA for 3 weeks, followed by 81mg  ASA indefinitely as tolerated. -Continue 80mg  Lipitor as well -Discharge with 30 day cardiac event monitor to rule out Afib   Depression with passive suicidal thoughts Patient with history of depression and acute worsened after this most recent stroke. Describes "not wanting to be a burden,": to his son and family who he has been living with. No active plan for suicide  and was evaluated by Psych and increased to 20mg  Celexa and started on 100mg  Trazodone nightly.  Left heel pain Continues to endorse left heel pain thought to be secondary to stage 1 pressure wound. He has his foot propped up on a soft pillow and notes that his home medication Norco 10-325 q 6 hrs prn has been helpful, only using this twice yesterday. Also endorses tenderness of the ankle joint. -Continue current pain management with Norco 10-325 q 6hrs prn -Continue  elevating foot -XR of L foot/ankle today  Cough Afebrile with new cough. Clinically appears well.  -Covid nasal swab today   Prior to admission living arrangement: son's home Anticipated discharge location: SNF Barriers to discharge: SNF placement Dispo: 1-2 days    LOS: 4 days   , Medical Student 04/23/2021, 8:57 AM

## 2021-04-23 NOTE — Plan of Care (Signed)
  Problem: Education: Goal: Knowledge of General Education information will improve Description: Including pain rating scale, medication(s)/side effects and non-pharmacologic comfort measures Outcome: Progressing   Problem: Health Behavior/Discharge Planning: Goal: Ability to manage health-related needs will improve Outcome: Progressing   Problem: Clinical Measurements: Goal: Ability to maintain clinical measurements within normal limits will improve Outcome: Progressing Goal: Will remain free from infection Outcome: Progressing Goal: Diagnostic test results will improve Outcome: Progressing Goal: Respiratory complications will improve Outcome: Progressing Goal: Cardiovascular complication will be avoided Outcome: Progressing   Problem: Activity: Goal: Risk for activity intolerance will decrease Outcome: Progressing   Problem: Nutrition: Goal: Adequate nutrition will be maintained Outcome: Progressing   Problem: Coping: Goal: Level of anxiety will decrease Outcome: Progressing   Problem: Elimination: Goal: Will not experience complications related to urinary retention Outcome: Progressing   Problem: Pain Managment: Goal: General experience of comfort will improve Outcome: Progressing   Problem: Safety: Goal: Ability to remain free from injury will improve Outcome: Progressing   Problem: Skin Integrity: Goal: Risk for impaired skin integrity will decrease Outcome: Progressing   Problem: Education: Goal: Knowledge of secondary prevention will improve Outcome: Progressing Goal: Knowledge of patient specific risk factors addressed and post discharge goals established will improve Outcome: Progressing   Problem: Self-Care: Goal: Ability to participate in self-care as condition permits will improve Outcome: Progressing

## 2021-04-23 NOTE — Progress Notes (Signed)
Physical Therapy Treatment Patient Details Name: Marcus [name] MRN: [id number] DOB: 04-Aug-1951 Today's Date: 04/23/2021    History of Present Illness 70 yo male presenting to ED on 8/10 with L sided weakness and ataxic gait. MRI showing acute R pons and L occipital infarcts. PMH including GERD, depression, CHF, HLD, HTN, CKD III, severe PAD s/p Fem/Pop bypass 2021, CAD s/p MI in 3/22 and CABG, PCI to right coronary artery 3/22, ankylosing spondylitis of spine, and subacute left pontine infarct in 2017    PT Comments    Pt tolerated treatment well and was very motivated to walk during session. Pt increased ambulation distance compared to previous session, requiring similar level of assist and a chair follow. Pt decreased balance when ambulating remains, with LOB when initiating task and difficulty with turning/pivoting. Updated d/c recommendation to CIR, as pt appears to be increasingly motivated to maximize function with remaining impairments.   Follow Up Recommendations  CIR;Supervision/Assistance - 24 hour     Equipment Recommendations  Rolling walker with 5" wheels    Recommendations for Other Services       Precautions / Restrictions Precautions Precautions: Fall Restrictions Weight Bearing Restrictions: No    Mobility  Bed Mobility Overal bed mobility: Needs Assistance Bed Mobility: Sit to Supine     Supine to sit: Min guard Sit to supine: Supervision   General bed mobility comments: Supervision for safety    Transfers Overall transfer level: Needs assistance Equipment used: Rolling walker (2 wheeled) Transfers: Sit to/from [employer] Sit to Stand: Mod assist Stand pivot transfers: Min assist       General transfer comment: Performed sit to stand x2 with the 1st trial requiring modA to power up to standing, while the 2nd trial required more of a minA. Verbal cueing provided for hand placement on RW. MinA for stand pivot transfer to increase  steadiness.  Ambulation/Gait Ambulation/Gait assistance: Mod assist;+2 safety/equipment Gait Distance (Feet): 120 Feet Assistive device: Rolling walker (2 wheeled) Gait Pattern/deviations: Decreased step length - left;Decreased stance time - left;Decreased stride length;Trunk flexed;Step-to pattern;Ataxic Gait velocity: decreased Gait velocity interpretation: <1.8 ft/sec, indicate of risk for recurrent falls General Gait Details: Ambulated in hallway with modA +2 for chair follow. Mod A to steady and guide RW with LOB x1 when initially standing. Pt also demonstrated difficulty with turning/pivoting to return back to room. Verbal cueing provided for upright posture, hand placement on RW, and lifting head with multiple standing breaks to make adjustments   Stairs             Wheelchair Mobility    Modified Rankin (Stroke Patients Only)       Balance Overall balance assessment: Needs assistance Sitting-balance support: Feet unsupported;No upper extremity supported Sitting balance-Leahy Scale: Fair     Standing balance support: Bilateral upper extremity supported;During functional activity Standing balance-Leahy Scale: Poor Standing balance comment: Requires BUE and external support                            Cognition Arousal/Alertness: Awake/alert Behavior During Therapy: WFL for tasks assessed/performed Overall Cognitive Status: Impaired/Different from baseline Area of Impairment: Safety/judgement;Attention;Following commands                   Current Attention Level: Selective   Following Commands: Follows one step commands with increased time;Follows one step commands consistently Safety/Judgement: Decreased awareness of safety Awareness: Intellectual;Emergent Problem Solving: Slow processing;Requires verbal cues General Comments: Pt  motivated to ambulate in hallway today. Pt demonstrated decreased awareness of safety, especially when walking with  RW, taking hand off of RW.      Exercises General Exercises - Lower Extremity Long Arc Quad: AROM;Both;15 reps;Seated Hip Flexion/Marching: AROM;Both;15 reps;Seated (Sharp pain in groin area) Toe Raises: AROM;Both;10 reps;Seated;Other (comment) (Sharp pain near L great toe)    General Comments General comments (skin integrity, edema, etc.): VSS      Pertinent Vitals/Pain Pain Assessment: Faces Faces Pain Scale: Hurts little more Pain Location: LLE Pain Descriptors / Indicators: Grimacing;Sharp;Shooting Pain Intervention(s): Monitored during session;Repositioned;Limited activity within patient's tolerance    Home Living                      Prior Function            PT Goals (current goals can now be found in the care plan section) Acute Rehab PT Goals Patient Stated Goal: to be less of a burden PT Goal Formulation: With patient Time For Goal Achievement: 05/03/21 Potential to Achieve Goals: Good Progress towards PT goals: Progressing toward goals    Frequency    Min 3X/week      PT Plan Discharge plan needs to be updated    Co-evaluation              AM-PAC PT "6 Clicks" Mobility   Outcome Measure  Help needed turning from your back to your side while in a flat bed without using bedrails?: A Little Help needed moving from lying on your back to sitting on the side of a flat bed without using bedrails?: A Little Help needed moving to and from a bed to a chair (including a wheelchair)?: A Little Help needed standing up from a chair using your arms (e.g., wheelchair or bedside chair)?: A Little Help needed to walk in hospital room?: A Lot Help needed climbing 3-5 steps with a railing? : Total 6 Click Score: 15    End of Session Equipment Utilized During Treatment: Gait belt Activity Tolerance: Patient tolerated treatment well Patient left: with bed alarm set;in bed;with call bell/phone within reach;with family/visitor present Nurse  Communication: Mobility status PT Visit Diagnosis: Unsteadiness on feet (R26.81);Muscle weakness (generalized) (M62.81);Other abnormalities of gait and mobility (R26.89);Difficulty in walking, not elsewhere classified (R26.2);Ataxic gait (R26.0);Other symptoms and signs involving the nervous system (R29.898)     Time: 1343-1410 PT Time Calculation (min) (ACUTE ONLY): 27 min  Charges:  $Gait Training: 8-22 mins $Therapeutic Exercise: 8-22 mins                     Velda Shell, SPT Acute Rehab: [phone]     Vance Gather 04/23/2021, 2:51 PM

## 2021-04-23 NOTE — Progress Notes (Signed)
Occupational Therapy Treatment Patient Details Name: Marcus [name] MRN: [id number] DOB: Oct 08, 1950 Today's Date: 04/23/2021    History of present illness 70 yo male presenting to ED on 8/10 with L sided weakness and ataxic gait. MRI showing acute R pons and L occipital infarcts. PMH including GERD, depression, CHF, HLD, HTN, CKD III, severe PAD s/p Fem/Pop bypass 2021, CAD s/p MI in 3/22 and CABG, PCI to right coronary artery 3/22, ankylosing spondylitis of spine, and subacute left pontine infarct in 2017   OT comments  Pt progressing towards established OT goals. Continues to present with motivation to participate in therapy and engage in ADLs. Pt with linens soiled with urine upon OT arrival. Pt performing LB bathing with Mod A and stand pivot to recliner with Min A and RW. Pt donning/doffing his gown and performing grooming sitting at sink with Supervision. Pt continues to report numbness throughout LUE/LLE and requiring increased cues for incorporating LUE into tasks. Continue to recommend dc to CIR for intensive OT and will continue to follow acutely as admitted.    Follow Up Recommendations  CIR    Equipment Recommendations  None recommended by OT    Recommendations for Other Services PT consult;Rehab consult;Speech consult    Precautions / Restrictions Precautions Precautions: Fall       Mobility Bed Mobility Overal bed mobility: Needs Assistance Bed Mobility: Supine to Sit     Supine to sit: Min guard     General bed mobility comments: Min Guard A for safety    Transfers Overall transfer level: Needs assistance Equipment used: Rolling walker (2 wheeled) Transfers: Sit to/from [employer] Sit to Stand: Min assist         General transfer comment: Min A for initiating power up and then steadying in standing    Balance Overall balance assessment: Needs assistance Sitting-balance support: No upper extremity supported;Feet supported Sitting  balance-Leahy Scale: Fair     Standing balance support: Bilateral upper extremity supported;No upper extremity supported;During functional activity Standing balance-Leahy Scale: Poor                             ADL either performed or assessed with clinical judgement   ADL Overall ADL's : Needs assistance/impaired     Grooming: Oral care;Set up;Supervision/safety;Sitting;Wash/dry face;Wash/dry hands;Cueing for sequencing Grooming Details (indicate cue type and reason): Cues for incorporating LUE into tasks     Lower Body Bathing: Moderate assistance;+2 for safety/equipment Lower Body Bathing Details (indicate cue type and reason): Pt requiring assistance for posterior peri care and bathing after soiling bed with urine. Upper Body Dressing : Set up;Supervision/safety;Sitting Upper Body Dressing Details (indicate cue type and reason): don new gown     Toilet Transfer: Minimal assistance;RW (simulated to recliner)           Functional mobility during ADLs: Minimal assistance;Rolling walker (short distance) General ADL Comments: Upon arrival, pt soiled in bed with urine. Eager to participate in therapy. Pt requiring assistance for LB bathing. Able to perform UB dressing and peri care while seated. Pt requesting to complete grooming. Cues throughout for incorporating LUE. Pt requesting back to bed at end of session, but providing education on benefits of sitting upright     Vision       Perception     Praxis      Cognition Arousal/Alertness: Awake/alert Behavior During Therapy: [facility] for tasks assessed/performed;Impulsive Overall Cognitive Status: Impaired/Different from baseline Area of Impairment:  Attention;Following commands;Safety/judgement;Awareness;Problem solving                   Current Attention Level: Sustained;Selective   Following Commands: Follows one step commands with increased time;Follows one step commands consistently Safety/Judgement:  Decreased awareness of safety Awareness: Intellectual;Emergent Problem Solving: Slow processing;Requires verbal cues General Comments: Pt agreeable to therapy. Reporting he had been asking for assistance after his sheeting being soiled with urine. Pt eager to change out of soiled gown. Following simple commands consistently and engaging in bathing and grooming. Requiring cues to incorporate LUE into tasks.        Exercises     Shoulder Instructions       General Comments      Pertinent Vitals/ Pain       Pain Assessment: Faces Faces Pain Scale: Hurts little more Pain Location: L LE Pain Descriptors / Indicators: Cramping;Aching;Grimacing Pain Intervention(s): Monitored during session;Repositioned  Home Living                                          Prior Functioning/Environment              Frequency  Min 2X/week        Progress Toward Goals  OT Goals(current goals can now be found in the care plan section)  Progress towards OT goals: Progressing toward goals  Acute Rehab OT Goals Patient Stated Goal: to be less of a burden OT Goal Formulation: With patient Time For Goal Achievement: 05/03/21 Potential to Achieve Goals: Good ADL Goals Pt Will Perform Grooming: with min guard assist;standing Pt Will Perform Upper Body Dressing: with set-up;with supervision;sitting Pt Will Perform Lower Body Dressing: with min assist;sit to/from stand Pt Will Transfer to Toilet: with min guard assist;bedside commode;ambulating  Plan Discharge plan remains appropriate    Co-evaluation                 AM-PAC OT "6 Clicks" Daily Activity     Outcome Measure   Help from another person eating meals?: A Little Help from another person taking care of personal grooming?: A Little Help from another person toileting, which includes using toliet, bedpan, or urinal?: A Lot Help from another person bathing (including washing, rinsing, drying)?: A Lot Help  from another person to put on and taking off regular upper body clothing?: A Little Help from another person to put on and taking off regular lower body clothing?: A Lot 6 Click Score: 15    End of Session Equipment Utilized During Treatment: Gait belt;Rolling walker  OT Visit Diagnosis: Unsteadiness on feet (R26.81);Other abnormalities of gait and mobility (R26.89);Muscle weakness (generalized) (M62.81);History of falling (Z91.81);Other symptoms and signs involving cognitive function;Pain Pain - Right/Left: Left Pain - part of body: Arm;Leg   Activity Tolerance Patient tolerated treatment well   Patient Left in chair;with call bell/phone within reach;with chair alarm set;with nursing/sitter in room   Nurse Communication Mobility status        Time: 2353-6144 OT Time Calculation (min): 29 min  Charges: OT General Charges $OT Visit: 1 Visit OT Treatments $Self Care/Home Management : 23-37 mins  Sky Primo MSOT, OTR/L Acute Rehab Pager: [phone] Office: [phone]   Theodoro Grist Pinchus Weckwerth 04/23/2021, 1:12 PM

## 2021-04-23 NOTE — Progress Notes (Signed)
Name: Marcus [name] DOB: 02/12/1951  Please be advised that the above-named patient will require a short-term nursing home stay -- anticipated 30 days or less for rehabilitation and strengthening. The plan is for return home.

## 2021-04-23 NOTE — Progress Notes (Signed)
Inpatient Rehab Admissions Coordinator:   Note therapy updated recommendations to SNF and pt now agreeable.  Will sign off for CIR at this time.  SNF workup already in progress.   Estill Dooms, PT, DPT Admissions Coordinator [phone] 04/23/21  11:14 AM

## 2021-04-24 ENCOUNTER — Inpatient Hospital Stay (HOSPITAL_COMMUNITY): Payer: Medicare HMO

## 2021-04-24 DIAGNOSIS — F331 Major depressive disorder, recurrent, moderate: Secondary | ICD-10-CM | POA: Diagnosis not present

## 2021-04-24 DIAGNOSIS — I639 Cerebral infarction, unspecified: Secondary | ICD-10-CM | POA: Diagnosis not present

## 2021-04-24 DIAGNOSIS — E7800 Pure hypercholesterolemia, unspecified: Secondary | ICD-10-CM

## 2021-04-24 LAB — BASIC METABOLIC PANEL
Anion gap: 6 (ref 5–15)
BUN: 15 mg/dL (ref 8–23)
CO2: 27 mmol/L (ref 22–32)
Calcium: 8.7 mg/dL — ABNORMAL LOW (ref 8.9–10.3)
Chloride: 103 mmol/L (ref 98–111)
Creatinine, Ser: 1.58 mg/dL — ABNORMAL HIGH (ref 0.61–1.24)
GFR, Estimated: 47 mL/min — ABNORMAL LOW (ref >60)
Glucose, Bld: 124 mg/dL — ABNORMAL HIGH (ref 70–99)
Potassium: 4 mmol/L (ref 3.5–5.1)
Sodium: 136 mmol/L (ref 135–145)

## 2021-04-24 MED ORDER — SENNOSIDES-DOCUSATE SODIUM 8.6-50 MG PO TABS
2.0000 | ORAL_TABLET | Freq: Two times a day (BID) | ORAL | Status: DC
  Administered 2021-04-24 – 2021-05-04 (×18): 2 via ORAL
  Filled 2021-04-24 (×19): qty 2

## 2021-04-24 NOTE — Progress Notes (Signed)
  Date: 04/24/2021  Patient name: Marcus [name]  Medical record number: [id number]  Date of birth: 05/13/1951        I have seen and evaluated this patient and I have discussed the plan of care with the house staff. Please see Dr. Ned Card and MS[facility]'s note for complete details. I concur with their findings and plan for this patient.   Inez Catalina, MD 04/24/2021, 1:29 PM

## 2021-04-24 NOTE — Progress Notes (Signed)
RN at bedside patient complaining of difficulty urinating had output all day, still no BM. Patient abd very distended and complaining of lower abd pain and pressure. MD notified

## 2021-04-24 NOTE — Plan of Care (Signed)
  Problem: Education: Goal: Knowledge of General Education information will improve Description: Including pain rating scale, medication(s)/side effects and non-pharmacologic comfort measures Outcome: Progressing   Problem: Health Behavior/Discharge Planning: Goal: Ability to manage health-related needs will improve Outcome: Progressing   Problem: Clinical Measurements: Goal: Ability to maintain clinical measurements within normal limits will improve Outcome: Progressing Goal: Will remain free from infection Outcome: Progressing Goal: Diagnostic test results will improve Outcome: Progressing Goal: Respiratory complications will improve Outcome: Progressing Goal: Cardiovascular complication will be avoided Outcome: Progressing   Problem: Activity: Goal: Risk for activity intolerance will decrease Outcome: Progressing   Problem: Nutrition: Goal: Adequate nutrition will be maintained Outcome: Progressing   Problem: Coping: Goal: Level of anxiety will decrease Outcome: Progressing   Problem: Pain Managment: Goal: General experience of comfort will improve Outcome: Progressing   Problem: Safety: Goal: Ability to remain free from injury will improve Outcome: Progressing   Problem: Skin Integrity: Goal: Risk for impaired skin integrity will decrease Outcome: Progressing   Problem: Education: Goal: Knowledge of secondary prevention will improve Outcome: Progressing Goal: Knowledge of patient specific risk factors addressed and post discharge goals established will improve Outcome: Progressing   Problem: Self-Care: Goal: Ability to participate in self-care as condition permits will improve Outcome: Progressing   Problem: Education: Goal: Knowledge of risk factors and measures for prevention of condition will improve Outcome: Progressing   Problem: Coping: Goal: Psychosocial and spiritual needs will be supported Outcome: Progressing   Problem: Respiratory: Goal:  Will maintain a patent airway Outcome: Progressing Goal: Complications related to the disease process, condition or treatment will be avoided or minimized Outcome: Progressing

## 2021-04-24 NOTE — Progress Notes (Signed)
Physical Therapy Treatment Patient Details Name: Marcus [name] MRN: [id number] DOB: [birth year] Today's Date: 04/24/2021    History of Present Illness 70 yo male presenting to ED on 8/10 with L sided weakness and ataxic gait. MRI showing acute R pons and L occipital infarcts. PMH including GERD, depression, CHF, HLD, HTN, CKD III, severe PAD s/p Fem/Pop bypass 2021, CAD s/p MI in 3/22 and CABG, PCI to right coronary artery 3/22, ankylosing spondylitis of spine, and subacute left pontine infarct in 2017    PT Comments    Pt received in chair, relays that he continues to feel depressed about his situation and that he currently needs help and cannot be helping his family like he wants to be. Worked on sit>stand, required mod A at first and progressed to min A with ability to complete 3 reps in 25 secs. Pt ambulated 50' then 20' with RW and min A with L knee instability last 20'. Pt c/o L groin pain with gait. PT will continue to follow.    Follow Up Recommendations  CIR;Supervision/Assistance - 24 hour     Equipment Recommendations  Rolling walker with 5" wheels    Recommendations for Other Services Rehab consult     Precautions / Restrictions Precautions Precautions: Fall Restrictions Weight Bearing Restrictions: No    Mobility  Bed Mobility Overal bed mobility: Needs Assistance             General bed mobility comments: pt received in chair    Transfers Overall transfer level: Needs assistance Equipment used: Rolling walker (2 wheeled) Transfers: Sit to/from Stand Sit to Stand: Mod assist;Min assist         General transfer comment: first stand pt needed mod A for power up and stabilization as he transferred hands chair to RW. Improved with practice and pt able to perform sit>stand 3x in 25 secs with min A  Ambulation/Gait Ambulation/Gait assistance: Min assist Gait Distance (Feet): 70 Feet (50', 20') Assistive device: Rolling walker (2 wheeled) Gait  Pattern/deviations: Decreased step length - left;Decreased stance time - left;Decreased stride length;Trunk flexed;Step-to pattern;Ataxic;Decreased weight shift to left Gait velocity: decreased Gait velocity interpretation: <1.31 ft/sec, indicative of household ambulator General Gait Details: pt with mild instability L knee which worsened last 20' after pt was fatigued. LOB x1 with min A to correct   Stairs             Wheelchair Mobility    Modified Rankin (Stroke Patients Only) Modified Rankin (Stroke Patients Only) Pre-Morbid Rankin Score: Slight disability Modified Rankin: Moderately severe disability     Balance Overall balance assessment: Needs assistance Sitting-balance support: Feet unsupported;No upper extremity supported Sitting balance-Leahy Scale: Fair     Standing balance support: Bilateral upper extremity supported;During functional activity Standing balance-Leahy Scale: Poor Standing balance comment: Requires BUE and external support                            Cognition Arousal/Alertness: Awake/alert Behavior During Therapy: WFL for tasks assessed/performed;Flat affect Overall Cognitive Status: Impaired/Different from baseline Area of Impairment: Safety/judgement;Attention;Following commands                   Current Attention Level: Alternating   Following Commands: Follows one step commands with increased time;Follows one step commands consistently Safety/Judgement: Decreased awareness of safety Awareness: Intellectual;Emergent Problem Solving: Slow processing;Requires verbal cues        Exercises General Exercises - Lower Extremity Long Arc Quad:  AROM;Seated;10 reps;Left (with manual resistance each way) Hip Flexion/Marching: AROM;Seated;Left;10 reps (with manual resistance)    General Comments General comments (skin integrity, edema, etc.): O2 sats 97% on RA, HR 76 bpm. Pt now covid +, has cough that he reports is new. Pt  reports that he feels like he needs to urinate but cannot when he tries      Pertinent Vitals/Pain Pain Assessment: Faces Faces Pain Scale: Hurts even more Pain Location: L groin from low back, around hip Pain Descriptors / Indicators: Grimacing;Sharp;Shooting Pain Intervention(s): Limited activity within patient's tolerance;Monitored during session    Home Living                      Prior Function            PT Goals (current goals can now be found in the care plan section) Acute Rehab PT Goals Patient Stated Goal: to be less of a burden PT Goal Formulation: With patient Time For Goal Achievement: 05/03/21 Potential to Achieve Goals: Good Progress towards PT goals: Progressing toward goals    Frequency    Min 3X/week      PT Plan Discharge plan needs to be updated    Co-evaluation              AM-PAC PT "6 Clicks" Mobility   Outcome Measure  Help needed turning from your back to your side while in a flat bed without using bedrails?: A Little Help needed moving from lying on your back to sitting on the side of a flat bed without using bedrails?: A Little Help needed moving to and from a bed to a chair (including a wheelchair)?: A Little Help needed standing up from a chair using your arms (e.g., wheelchair or bedside chair)?: A Little Help needed to walk in hospital room?: A Lot Help needed climbing 3-5 steps with a railing? : Total 6 Click Score: 15    End of Session Equipment Utilized During Treatment: Gait belt Activity Tolerance: Patient tolerated treatment well Patient left: with call bell/phone within reach;with family/visitor present;in chair;with chair alarm set Nurse Communication: Mobility status PT Visit Diagnosis: Unsteadiness on feet (R26.81);Muscle weakness (generalized) (M62.81);Other abnormalities of gait and mobility (R26.89);Difficulty in walking, not elsewhere classified (R26.2);Ataxic gait (R26.0);Other symptoms and signs  involving the nervous system (R29.898)     Time: 0258-5277 PT Time Calculation (min) (ACUTE ONLY): 34 min  Charges:  $Gait Training: 8-22 mins $Therapeutic Exercise: 8-22 mins                     Lyanne Co, PT  Acute Rehab Services  Pager [phone] Office [phone]    Lawana Chambers Aristide Waggle 04/24/2021, 4:16 PM

## 2021-04-24 NOTE — Progress Notes (Signed)
Subjective:  Marcus [name] is a 70 year old person with a history of multiple arterial clots including three prior myocardial infarctions, prior stroke in 2017, and peripheral arterial diseae who presented with acute L sided weakness and paresthesias and MRI demonstrated acute infarcts of the R pons and L occipital lobe.  Marcus [name] notes that he is doing overall well this morning. He was diagnosed with Covid-19 yesterday but notes that he is breathing comfortably, denies subjective fevers, and only endorses a mild cough.  He also notes that his left sided facial warmth/tingling has resolved, but he continues to feel numbness and intermittent pins/needles in his left arm and upper thigh. Lower left leg numbness is marginally improved. Adds that he used his fork to feed himself and "spilled stuff everywhere," as an endorsement of his L hand clumsiness.   He notes that his left heel pain has been less bothersome for him.  Objective:  Vital signs in last 24 hours: Vitals:   04/23/21 2127 04/23/21 2355 04/24/21 0424 04/24/21 0906  BP: 127/67 134/68 125/68 139/78  Pulse: 100 92 84 71  Resp: 18 18 18 18   Temp: 98.8 F (37.1 C) 99.2 F (37.3 C) 98.7 F (37.1 C) 98 F (36.7 C)  TempSrc: Oral Oral Oral Oral  SpO2: 96% 97% 95% 98%  Weight:      Height:       Weight change:   Intake/Output Summary (Last 24 hours) at 04/24/2021 1027 Last data filed at 04/23/2021 1700 Gross per 24 hour  Intake 660 ml  Output 650 ml  Net 10 ml   General: Pleasant gentleman sitting up comfortably in bed. No acute distress. CV: RRR. No murmurs, rubs, or gallops. No LE edema Pulmonary: Lungs CTAB. Normal effort. No wheezing or rales. Extremities: Palpable radial and 1+ DP pulses. Normal ROM. Skin: Warm and dry. No obvious rash or lesions. Neuro: A&Ox3. Moves all extremities. Paresthesias of LUE and LLE. Psych: Normal mood and affect   Assessment/Plan:  Principal Problem:   Acute cerebral infarction  [facility]) Active Problems:   Hypertension   HLD (hyperlipidemia)   CKD (chronic kidney disease) stage 3, GFR 30-59 ml/min (HCC)   Acute CVA (cerebrovascular accident) (HCC)   Moderate episode of recurrent major depressive disorder (HCC)  Acute infarcts of R pons and L Occipital lobe Presented with acute left sided weakness and numbness which evolved into warm/tingling paresthesias. 04/18/21 MRI demonstrated acute infarcts and chronic small vessel infarcts. Out of tPA window. 04/18/21 CTA Head and Neck showed 60% stenosis of R carotid bulb and focal high grade stenosis of proximal L inferior M2, also R PCA w mild stenosis. Pt with 45+ pack year history and not adherent to risk-reducing mdications including Plavix, ASA, and Lipitor. 04/19/21 Echo with EF 55-60% and grade 1 diastolic dysfunction and no intracardiac source of embolism detected. LDL at 140 and A1C at 6.2. Neurology felt strokes more likely to be thrombotic and recommended DAPT with 75mg  Plavix and 81mg  ASA for 3 weeks, followed by 81mg  ASA indefinitely as tolerated. -Continue 80mg  Lipitor as well -Discharge with 30 day cardiac event monitor to rule out Afib  Covid-19 Diagnosis confirmed with PCR on 04/23/21. Vitals stable and afebrile. Clinically appearing well with only symptom of mild cough. Will continue to monitor.  Depression Patient with history of depression which has acute worsened after this most recent stroke. Describes "not wanting to be a burden," to his son and family who he has been living with. No  active plan for suicide and was evaluated by Psych and increased to 20mg  Celexa and started on 100mg  Trazodone nightly.  Constipation Has not had a bowel movement in several days. Started Miralax and Senna S yesterday. -Increase to Senna S BID today  Left heel pain Left heel pain has been improving. Some mild non-blanching erythema though to be secondary to stage 1 pressure wound. 04/23/21 XR of L foot/ankle was negative for any  bony or soft tissue abnormality. (Pt was continued on home regimen of Norco for chronic neck pain which has been helpful for his heel pain) -Continue current pain management with Norco 10-325 q 6hrs prn -Continue to elevate L foot  Prior to admission living arrangement: son's home Anticipated discharge location: SNF for acute rehab Barriers to discharge: SNF placement. Medically stable. Dispo: 1-2 days    LOS: 5 days   , Medical Student 04/24/2021, 10:27 AM

## 2021-04-24 NOTE — Plan of Care (Signed)
  Problem: Clinical Measurements: Goal: Diagnostic test results will improve Outcome: Progressing   Problem: Activity: Goal: Risk for activity intolerance will decrease Outcome: Progressing   Problem: Safety: Goal: Ability to remain free from injury will improve Outcome: Progressing   Problem: Education: Goal: Knowledge of secondary prevention will improve Outcome: Progressing   Problem: Self-Care: Goal: Ability to participate in self-care as condition permits will improve Outcome: Progressing   Problem: Education: Goal: Knowledge of risk factors and measures for prevention of condition will improve Outcome: Progressing   Problem: Coping: Goal: Psychosocial and spiritual needs will be supported Outcome: Progressing   Problem: Respiratory: Goal: Will maintain a patent airway Outcome: Progressing Goal: Complications related to the disease process, condition or treatment will be avoided or minimized Outcome: Progressing

## 2021-04-24 NOTE — TOC Progression Note (Signed)
Transition of Care [facility]) - Progression Note    Patient Details  Name: Marcus [name] MRN: [id number] Date of Birth: 11-14-1950  [facility]) CM/SW Contact  [name], [place] Phone Number: 04/24/2021, 3:31 PM  Clinical Narrative:   CSW noting per chart review that patient is now COVID+. [name] is unable to accept the patient while he needs airborne isolation. CSW has contacted [facility] to ask about COVID beds and they do not have a bed available at this time. [name] has denied a bed for the patient. There are no beds available at SNF at this time for the patient. CSW to continue to follow.    Expected Discharge Plan: Skilled Nursing Facility Barriers to Discharge: Awaiting State Approval [name]), Active Substance Use - Placement, SNF Pending bed offer  Expected Discharge Plan and Services Expected Discharge Plan: Skilled Nursing Facility In-house Referral: Clinical Social Work Discharge Planning Services: CM Consult Post Acute Care Choice: IP Rehab Living arrangements for the past 2 months: Single Family Home                                       Social Determinants of Health (SDOH) Interventions    Readmission Risk Interventions Readmission Risk Prevention Plan 12/09/2019  Transportation Screening Complete  PCP or Specialist Appt within 5-7 Days Complete  Home Care Screening Complete  Medication Review (RN CM) Complete  Some recent data might be hidden

## 2021-04-24 NOTE — TOC Progression Note (Signed)
Transition of Care [facility]) - Progression Note    Patient Details  Name: TAYDEN NICHELSON MRN: [id number] Date of Birth: July 11, 1951  Transition of Care [facility]) CM/SW [place], [place] Phone Number: 04/24/2021, 3:29 PM  Clinical Narrative:   CSW met with patient to provide bed offers. CSW read through the list for the patient and he chose [name]. CSW asked patient about contacting his family to update them, and patient refused, said his family didn't need to know. CSW confirmed bed availability at [facility] and initiated insurance authorization process. PASRR is pending, CSW to submit PASRR documents for review. CSW to follow.    Expected Discharge Plan: Skilled Nursing Facility Barriers to Discharge: [place] [facility]), Active Substance Use - Placement, SNF Pending bed offer  Expected Discharge Plan and Services Expected Discharge Plan: [place] In-house Referral: Clinical Social Work Discharge Planning Services: CM Consult Post Acute Care Choice: IP Rehab Living arrangements for the past 2 months: Single Family Home                                       Social Determinants of Health (SDOH) Interventions    Readmission Risk Interventions Readmission Risk Prevention Plan 12/09/2019  Transportation Screening Complete  PCP or Specialist Appt within 5-7 Days Complete  Home Care Screening Complete  Medication Review (RN CM) Complete  Some recent data might be hidden

## 2021-04-24 NOTE — Progress Notes (Signed)
Paged MD because had not had a BM in 6 days and said feels miserable.  Did have senokot ordered and miralax 2 days ago but said he feels so full and uncomfortable.  I did discuss with him the high risk of constipation because of his limited mobility and not getting up and moving, along with his medications that he is taking as well.  MD said she would order something else for him to take later so that he can get some sleep now. Abd is not distended and he denies abdominal pain.

## 2021-04-24 NOTE — Care Management Important Message (Signed)
Important Message  Patient Details  Name: ANIKETH HUBERTY MRN: [id number] Date of Birth: 12/10/1950   Medicare Important Message Given:  Yes Patient has a contact  precaution order in place.  Will mail to the patient home address.     Sondra Blixt 04/24/2021, 4:05 PM

## 2021-04-25 DIAGNOSIS — I639 Cerebral infarction, unspecified: Secondary | ICD-10-CM | POA: Diagnosis not present

## 2021-04-25 LAB — BASIC METABOLIC PANEL
Anion gap: 7 (ref 5–15)
BUN: 16 mg/dL (ref 8–23)
CO2: 27 mmol/L (ref 22–32)
Calcium: 9 mg/dL (ref 8.9–10.3)
Chloride: 103 mmol/L (ref 98–111)
Creatinine, Ser: 1.36 mg/dL — ABNORMAL HIGH (ref 0.61–1.24)
GFR, Estimated: 56 mL/min — ABNORMAL LOW (ref >60)
Glucose, Bld: 125 mg/dL — ABNORMAL HIGH (ref 70–99)
Potassium: 4 mmol/L (ref 3.5–5.1)
Sodium: 137 mmol/L (ref 135–145)

## 2021-04-25 MED ORDER — COLCHICINE 0.6 MG PO TABS: 0.6000 mg | ORAL_TABLET | Freq: Two times a day (BID) | ORAL | Status: DC

## 2021-04-25 MED ORDER — COLCHICINE 0.6 MG PO TABS
1.2000 mg | ORAL_TABLET | Freq: Every day | ORAL | Status: AC
  Administered 2021-04-25: 1.2 mg via ORAL
  Filled 2021-04-25: qty 2

## 2021-04-25 MED ORDER — COLCHICINE 0.6 MG PO TABS
0.6000 mg | ORAL_TABLET | Freq: Two times a day (BID) | ORAL | Status: AC
  Administered 2021-04-26 – 2021-05-03 (×16): 0.6 mg via ORAL
  Filled 2021-04-25 (×16): qty 1

## 2021-04-25 NOTE — Progress Notes (Signed)
Patient asked for SW, SW called and left voicemail for patient on their personal cell phone, Swer Bernette from [facility] also called, could not reach patient, left her number at bedside w patient.   Patient A+Ox4, but will have some memory lapses, say they did not receive meals, but was given meals.

## 2021-04-25 NOTE — Progress Notes (Signed)
Occupational Therapy Treatment Patient Details Name: Marcus [name] MRN: [id number] DOB: Mar 19, 1951 Today's Date: 04/25/2021    History of present illness 70 yo male presenting to ED on 8/10 with L sided weakness and ataxic gait. MRI showing acute R pons and L occipital infarcts. PMH including GERD, depression, CHF, HLD, HTN, CKD III, severe PAD s/p Fem/Pop bypass 2021, CAD s/p MI in 3/22 and CABG, PCI to right coronary artery 3/22, ankylosing spondylitis of spine, and subacute left pontine infarct in 2017   OT comments  Patient progressing and showed improved ability to perform ADLs in standing with Min guard to Min Assist including sponge bathing upper and lower body, compared to previous session where pt required sitting for ADLs. Pt had one significant LOB when he closed his eyes for face hygiene and required Min-Mod As to avoid fall.  Pt educated to sit for safety when closing eyes, but pt continues to have decreased awareness of impairments and did not agree with this recommendations stating "I'm fine".  Patient remains limited by LT sided and generalized weakness, decreased awareness of safety and impairments, and decreased activity tolerance along with deficits noted below. Pt continues to demonstrate good rehab potential and would benefit from continued skilled OT to increase safety and independence with ADLs and functional transfers to allow pt to return home safely and reduce caregiver burden and fall risk.   Follow Up Recommendations  CIR    Equipment Recommendations  None recommended by OT    Recommendations for Other Services      Precautions / Restrictions Precautions Precaution Comments: Airborn/Contact       Mobility Bed Mobility Overal bed mobility: Needs Assistance       Supine to sit: Supervision;HOB elevated          Transfers Overall transfer level: Needs assistance Equipment used: Rolling walker (2 wheeled) Transfers: Sit to/from Stand;Stand Pivot  Transfers Sit to Stand: From elevated surface;Min guard Stand pivot transfers: Min guard       General transfer comment: Pt not following cues for hand placement and keep hands on RW per his preference. Min [place] to steady RW and pt for safety. Min Guard while pivoting to standard commode after ambulating in room with RW ~15' x 2 with Min As.    Balance Overall balance assessment: Needs assistance Sitting-balance support: Feet unsupported;No upper extremity supported Sitting balance-Leahy Scale: Fair     Standing balance support: Bilateral upper extremity supported;During functional activity Standing balance-Leahy Scale: Poor Standing balance comment: Requires BUE and external support. LOB at sink with eyes closed                           ADL either performed or assessed with clinical judgement   ADL Overall ADL's : Needs assistance/impaired     Grooming: Minimal assistance;Moderate assistance;Supervision/safety;Wash/dry face;Wash/dry hands;Applying deodorant Grooming Details (indicate cue type and reason): Pt cued to use LUE ad lib or to WB through LUE if not using functionally at sink. Pt intermittently follows instructions. Pt required only supervision and cues for sequencing when washing hands, but had significant loss of balance once pt closed his eyes for face washing with need of Min-Mod As to steady. Ed to sit for ADLs when having to close eyes. Upper Body Bathing: Standing;Minimal assistance Upper Body Bathing Details (indicate cue type and reason): Min As to steady as needed while standing at sink and to clean ears for thoroughness. Lower Body Bathing: Minimal assistance;Sit  to/from stand Lower Body Bathing Details (indicate cue type and reason): Pt widened base of support to perform hygiene to peri areas and lower body in standing with need of Min As for balance support and pt using unilateral UE support. Upper Body Dressing : Set  up;Supervision/safety;Sitting Upper Body Dressing Details (indicate cue type and reason): don new gown at EOB. Cues to dress LUE first for ease.     Toilet Transfer: Solicitor;Ambulation;RW;Cueing for sequencing;Cueing for safety;Grab bars Toilet Transfer Details (indicate cue type and reason): Pt able to ascend and descend to standard hospital toilet with heavy use of wall mounted grab bar and cues for safety/sequencing. Toileting- Clothing Manipulation and Hygiene: Minimal assistance;Sit to/from stand;Sitting/lateral lean Toileting - Clothing Manipulation Details (indicate cue type and reason): Pt performed partial hygiene while on commode. Pt used washclothes while standing at sink to complete hygiene with Min As and setup of warm clothes.     Functional mobility during ADLs: Minimal assistance;Rolling walker;Min guard;Moderate assistance       Vision Baseline Vision/History: Wears glasses Wears Glasses: At all times Patient Visual Report: No change from baseline     Perception     Praxis      Cognition Arousal/Alertness: Awake/alert Behavior During Therapy: WFL for tasks assessed/performed;Flat affect Overall Cognitive Status: Impaired/Different from baseline Area of Impairment: Safety/judgement;Attention;Following commands                   Current Attention Level: Alternating   Following Commands: Follows one step commands with increased time;Follows one step commands consistently Safety/Judgement: Decreased awareness of safety;Decreased awareness of deficits Awareness: Intellectual;Emergent Problem Solving: Slow processing;Requires verbal cues General Comments: Pt with clear loss of balance while closing his eyes to wash his face and need of Min-Mod As to steady. Pt educated to sit down whenever performing ADLs that require eyes closed. Pt laughed and denied any issues with balance.        Exercises     Shoulder Instructions       General  Comments      Pertinent Vitals/ Pain       Pain Assessment: Faces Faces Pain Scale: Hurts a little bit Pain Location: Abdominal area, however pt reporting partial relief with bowel movement.  Also RT foot prior to standing which pt reported as "gout" Pain Descriptors / Indicators: Grimacing Pain Intervention(s): Limited activity within patient's tolerance;Monitored during session  Home Living                                          Prior Functioning/Environment              Frequency  Min 2X/week        Progress Toward Goals  OT Goals(current goals can now be found in the care plan section)  Progress towards OT goals: Progressing toward goals  Acute Rehab OT Goals Patient Stated Goal: "To get back to normal" OT Goal Formulation: With patient Time For Goal Achievement: 05/03/21 Potential to Achieve Goals: Good  Plan Discharge plan remains appropriate    Co-evaluation                 AM-PAC OT "6 Clicks" Daily Activity     Outcome Measure   Help from another person eating meals?: A Little Help from another person taking care of personal grooming?: A Little Help from another person toileting, which includes  using toliet, bedpan, or urinal?: A Little Help from another person bathing (including washing, rinsing, drying)?: A Little Help from another person to put on and taking off regular upper body clothing?: A Little Help from another person to put on and taking off regular lower body clothing?: A Lot 6 Click Score: 17    End of Session Equipment Utilized During Treatment: Gait belt;Rolling walker  OT Visit Diagnosis: Unsteadiness on feet (R26.81);Other abnormalities of gait and mobility (R26.89);Muscle weakness (generalized) (M62.81);History of falling (Z91.81);Other symptoms and signs involving cognitive function;Pain Pain - Right/Left: Right Pain - part of body: Ankle and joints of foot   Activity Tolerance Patient tolerated treatment  well   Patient Left with nursing/sitter in room;in bed (Sitting EOB with RN in room.)   Nurse Communication  (Pt had BM but flushed it.)        Time: 6948-5462 OT Time Calculation (min): 25 min  Charges: OT General Charges $OT Visit: 1 Visit OT Treatments $Self Care/Home Management : 8-22 mins $Therapeutic Activity: 8-22 mins  Victorino Dike, OT Acute Rehab Services Office: [phone] 04/25/2021   Theodoro Clock 04/25/2021, 10:30 AM

## 2021-04-25 NOTE — Plan of Care (Signed)
  Problem: Education: Goal: Knowledge of General Education information will improve Description: Including pain rating scale, medication(s)/side effects and non-pharmacologic comfort measures Outcome: Progressing   Problem: Health Behavior/Discharge Planning: Goal: Ability to manage health-related needs will improve Outcome: Progressing   Problem: Clinical Measurements: Goal: Ability to maintain clinical measurements within normal limits will improve Outcome: Progressing Goal: Will remain free from infection Outcome: Progressing Goal: Diagnostic test results will improve Outcome: Progressing Goal: Respiratory complications will improve Outcome: Progressing Goal: Cardiovascular complication will be avoided Outcome: Progressing   Problem: Activity: Goal: Risk for activity intolerance will decrease Outcome: Progressing   Problem: Nutrition: Goal: Adequate nutrition will be maintained Outcome: Progressing   Problem: Coping: Goal: Level of anxiety will decrease Outcome: Progressing   Problem: Elimination: Goal: Will not experience complications related to bowel motility Outcome: Progressing Goal: Will not experience complications related to urinary retention Outcome: Progressing   Problem: Pain Managment: Goal: General experience of comfort will improve Outcome: Progressing   Problem: Safety: Goal: Ability to remain free from injury will improve Outcome: Progressing   Problem: Skin Integrity: Goal: Risk for impaired skin integrity will decrease Outcome: Progressing   Problem: Education: Goal: Knowledge of secondary prevention will improve Outcome: Progressing Goal: Knowledge of patient specific risk factors addressed and post discharge goals established will improve Outcome: Progressing   Problem: Self-Care: Goal: Ability to participate in self-care as condition permits will improve Outcome: Progressing   Problem: Education: Goal: Knowledge of risk factors and  measures for prevention of condition will improve Outcome: Progressing   Problem: Coping: Goal: Psychosocial and spiritual needs will be supported Outcome: Progressing   Problem: Respiratory: Goal: Will maintain a patent airway Outcome: Progressing Goal: Complications related to the disease process, condition or treatment will be avoided or minimized Outcome: Progressing   

## 2021-04-25 NOTE — Progress Notes (Signed)
HD#7 SUBJECTIVE:  Patient Summary: Marcus [name] is a 70 year old person multiple arterial clots history including 3 myocardial infarctions, stroke in 2017, an PAD who is admitted for acute strokes of R pons and L occipital lobes.   Overnight Events: No acute events overnight  Interim History: This is hospital day 7 for Marcus [name] who was seen and evaluated at the bedside this morning. He reports that he had a bowel movement this morning and his abdominal pain is significantly improved. He continues to have paresthesias in his left upper and lower extremity, but he continues to work with PT/OT while pending SNF placement. Also endorses some pain in his left big toe, consistent with how his previous gout flares have felt.  OBJECTIVE:  Vital Signs: Vitals:   04/24/21 1100 04/24/21 1530 04/24/21 2053 04/24/21 2352  BP: (!) 144/70 (!) 158/81 (!) 152/96 (!) 156/81  Pulse: 79 70 69 81  Resp: 18 18 17 19   Temp: 97.9 F (36.6 C) 98.2 F (36.8 C) 97.7 F (36.5 C) 98 F (36.7 C)  TempSrc: Oral Oral Oral Oral  SpO2: 94% 95% 95% 95%  Weight:      Height:       Supplemental O2: Room Air SpO2: 95 %  Filed Weights   04/18/21 1139  Weight: 81.6 kg     Intake/Output Summary (Last 24 hours) at 04/25/2021 04/27/2021 Last data filed at 04/24/2021 2100 Gross per 24 hour  Intake 540 ml  Output 750 ml  Net -210 ml   Net IO Since Admission: 78 mL [04/25/21 0714]  Physical Exam: General: Pleasant gentleman sitting up comfortably in bed. No acute distress. CV: RRR. No murmurs, rubs, or gallops. No LE edema Extremities: Palpable radial and 1+ DP pulses. Normal ROM. Limited ROM of left 1st metatarsal  Skin: Warm and dry. No obvious rash or lesions. Neuro: A&Ox3. Moves all extremities. Paresthesias in LUE. Strength 4+/5 in LUE.  Psych: Normal mood and affect       ASSESSMENT/PLAN:  Assessment: Principal Problem:   Acute cerebral infarction (HCC) Active Problems:   Hypertension   HLD  (hyperlipidemia)   CKD (chronic kidney disease) stage 3, GFR 30-59 ml/min (HCC)   Acute CVA (cerebrovascular accident) (HCC)   Moderate episode of recurrent major depressive disorder (HCC)   Plan: #Acute infarcts of R pons and L Occipital lobe Patient will continue on DAPT with 75mg  Plavix and 81mg  ASA for 3 weeks, followed by 81mg  ASA indefinitely as tolerated. Also will continue with 80mg  Lipitor daily. Recommended by neuro to have a 30 day cardiac event monitor on discharge to rule out Afib. - Continue to work with PT/OT - SNF placement pending  #Covid-19 Diagnosis confirmed with PCR on 04/23/21. Vitals stable and afebrile. Clinically appearing well with only symptom of mild productive cough. Will continue to monitor.  #Depression Patient with history of depression which has acute worsened after this most recent stroke. Describes "not wanting to be a burden," to his son and family who he has been living with. No active plan for suicide and was evaluated by Psych.  - Continue Celexa 20mg  and Trazodone 100mg  prn nightly.  #Constipation Had a bowel movement this morning after several days without one.  - Senna BID PRN and miralax PRN  #Left 1st metatarsal pain likely 2/2 gout flare Left big toe with no erythema, however, the patient is experiencing pain and has limited ROM of the joint. He has had gout flares in the past and  feels that his symptoms are similar to what he has experienced. - Started on 1.2mg  colchicine today, followed by 0.6mg  BID thereafter - Continue current pain management with Norco 10-325 q 6hrs prn   VTE Prophylaxis: Xarelto 10 mg Diet: Regular  Prior to admission living arrangement: son's home Anticipated discharge location: SNF for acute rehab Barriers to discharge: SNF placement. Medically stable. Dispo: 1-2 days   Signature: Elza Rafter, D.O.  Internal Medicine Resident, PGY-1 [facility]  Pager: [phone] 7:14 AM,  04/25/2021   Please contact the on call pager after 5 pm and on weekends at [phone].

## 2021-04-26 DIAGNOSIS — I639 Cerebral infarction, unspecified: Secondary | ICD-10-CM | POA: Diagnosis not present

## 2021-04-26 LAB — BASIC METABOLIC PANEL
Anion gap: 10 (ref 5–15)
BUN: 16 mg/dL (ref 8–23)
CO2: 27 mmol/L (ref 22–32)
Calcium: 9.4 mg/dL (ref 8.9–10.3)
Chloride: 101 mmol/L (ref 98–111)
Creatinine, Ser: 1.32 mg/dL — ABNORMAL HIGH (ref 0.61–1.24)
GFR, Estimated: 58 mL/min — ABNORMAL LOW (ref >60)
Glucose, Bld: 108 mg/dL — ABNORMAL HIGH (ref 70–99)
Potassium: 3.9 mmol/L (ref 3.5–5.1)
Sodium: 138 mmol/L (ref 135–145)

## 2021-04-26 NOTE — Progress Notes (Signed)
Physical Therapy Treatment Patient Details Name: Marcus [name] MRN: [id number] DOB: 09-19-1950 Today's Date: 04/26/2021    History of Present Illness 70 yo male presenting to ED on 8/10 with L sided weakness and ataxic gait. MRI showing acute R pons and L occipital infarcts. PMH including GERD, depression, CHF, HLD, HTN, CKD III, severe PAD s/p Fem/Pop bypass 2021, CAD s/p MI in 3/22 and CABG, PCI to right coronary artery 3/22, ankylosing spondylitis of spine, and subacute left pontine infarct in 2017    PT Comments    The pt was eager to participate in session with focus on progression of OOB mobility and dynamic stability. He was able to progress ambulation distance within the room at this time, but continues to demo instability and weakness with LLE resulting in staggering steps and need for physical assist at times with ambulation, especially when turning to the R. The pt was challenged by activities such as increased gait speed, high knees while walking, and focusing on posture. He was also challenged by performance of sit-stand with RLE advanced to increase wt shift to LLE. The pt requires increased time to power up and min-modA due to continued weakness in LLE. Continue to recommend CIR at d/c to address these deficits.     Follow Up Recommendations  CIR;Supervision/Assistance - 24 hour     Equipment Recommendations  Rolling walker with 5" wheels    Recommendations for Other Services       Precautions / Restrictions Precautions Precautions: Fall Precaution Comments: Airborne/Contact Restrictions Weight Bearing Restrictions: No    Mobility  Bed Mobility Overal bed mobility: Needs Assistance Bed Mobility: Sit to Supine     Supine to sit: Supervision;HOB elevated     General bed mobility comments: supervision, no assist needed    Transfers Overall transfer level: Needs assistance Equipment used: Rolling walker (2 wheeled) Transfers: Sit to/from Stand Sit to Stand:  Min assist         General transfer comment: minA to power up from low surface with minG, but needing increased assist to power up to standing from low surface or with RLE placed outstretched to shift wt to LLE. pt with no buckling, but needing increased time to power up to standing  Ambulation/Gait Ambulation/Gait assistance: Min assist;Mod assist Gait Distance (Feet): 30 Feet (+ 25 ft) Assistive device: Rolling walker (2 wheeled) Gait Pattern/deviations: Decreased step length - left;Decreased stance time - left;Decreased stride length;Trunk flexed;Step-to pattern;Ataxic;Decreased weight shift to left;Staggering left Gait velocity: decreased Gait velocity interpretation: <1.31 ft/sec, indicative of household ambulator General Gait Details: pt with no buckling L knee, at this time, but staggering L, especially with turning to the R. THe pt completed multiple loops of walking in the room with added challenge such as increasing speed, high knees, and with focus on posture    Modified Rankin (Stroke Patients Only) Modified Rankin (Stroke Patients Only) Pre-Morbid Rankin Score: Slight disability Modified Rankin: Moderately severe disability     Balance Overall balance assessment: Needs assistance Sitting-balance support: Feet unsupported;No upper extremity supported Sitting balance-Leahy Scale: Fair     Standing balance support: Bilateral upper extremity supported;During functional activity Standing balance-Leahy Scale: Poor Standing balance comment: Requires BUE and external support. LOB at sink with eyes closed                            Cognition Arousal/Alertness: Awake/alert Behavior During Therapy: WFL for tasks assessed/performed;Flat affect Overall Cognitive Status: Impaired/Different from  baseline Area of Impairment: Attention;Safety/judgement                   Current Attention Level: Alternating   Following Commands: Follows one step commands with  increased time;Follows multi-step commands inconsistently Safety/Judgement: Decreased awareness of safety;Decreased awareness of deficits   Problem Solving: Slow processing;Requires verbal cues General Comments: VC at times for safety and to improve safety or balance with navigation, pt unable to assess path/balance without cues.      Exercises      General Comments General comments (skin integrity, edema, etc.): VSS on RA, pt with difficulty urinating this sessio      Pertinent Vitals/Pain Pain Assessment: Faces Faces Pain Scale: Hurts whole lot Pain Location: L great toe, pt reports it feels like his gout that he has at baseline Pain Descriptors / Indicators: Grimacing Pain Intervention(s): Limited activity within patient's tolerance;Monitored during session;Repositioned     PT Goals (current goals can now be found in the care plan section) Acute Rehab PT Goals Patient Stated Goal: "To get back to normal" PT Goal Formulation: With patient Time For Goal Achievement: 05/03/21 Potential to Achieve Goals: Good Progress towards PT goals: Progressing toward goals    Frequency    Min 3X/week      PT Plan Current plan remains appropriate       AM-PAC PT "6 Clicks" Mobility   Outcome Measure  Help needed turning from your back to your side while in a flat bed without using bedrails?: A Little Help needed moving from lying on your back to sitting on the side of a flat bed without using bedrails?: A Little Help needed moving to and from a bed to a chair (including a wheelchair)?: A Little Help needed standing up from a chair using your arms (e.g., wheelchair or bedside chair)?: A Little Help needed to walk in hospital room?: A Lot Help needed climbing 3-5 steps with a railing? : Total 6 Click Score: 15    End of Session Equipment Utilized During Treatment: Gait belt Activity Tolerance: Patient tolerated treatment well Patient left: with call bell/phone within reach;with  family/visitor present;in chair;with chair alarm set Nurse Communication: Mobility status PT Visit Diagnosis: Unsteadiness on feet (R26.81);Muscle weakness (generalized) (M62.81);Other abnormalities of gait and mobility (R26.89);Difficulty in walking, not elsewhere classified (R26.2);Ataxic gait (R26.0);Other symptoms and signs involving the nervous system (R29.898)     Time: 9892-1194 PT Time Calculation (min) (ACUTE ONLY): 41 min  Charges:  $Gait Training: 8-22 mins $Therapeutic Activity: 8-22 mins $Neuromuscular Re-education: 8-22 mins                     Lazarus Gowda, PT, DPT   Acute Rehabilitation Department Pager #: [phone]   Ronnie Derby 04/26/2021, 5:51 PM

## 2021-04-26 NOTE — TOC Progression Note (Signed)
Transition of Care [facility]) - Progression Note    Patient Details  Name: Marcus [name] MRN: [id number] Date of Birth: [birth year]  Transition of [facility]) CM/SW Contact  [name], [place] Phone Number: 04/26/2021, 10:27 AM  Clinical Narrative:   CSW alerted by [facility] that they are no longer going to be taking COVID+ patients for SNF. There are no COVID SNFs that have offered a bed for the patient, he will not be able to DC to SNF until [name] can accept when out of airborne isolation. CSW attempted to contact patient to discuss, called room phone and his cell phone and left a voicemail. Unable to reach patient today to update. CSW to continue to follow.    Expected Discharge Plan: Skilled Nursing Facility Barriers to Discharge: Awaiting State Approval [facility]), Active Substance Use - Placement, SNF Pending bed offer  Expected Discharge Plan and Services Expected Discharge Plan: Skilled Nursing Facility In-house Referral: Clinical Social Work Discharge Planning Services: CM Consult Post Acute Care Choice: IP Rehab Living arrangements for the past 2 months: Single Family Home                                       Social Determinants of Health (SDOH) Interventions    Readmission Risk Interventions Readmission Risk Prevention Plan 12/09/2019  Transportation Screening Complete  PCP or Specialist Appt within 5-7 Days Complete  Home Care Screening Complete  Medication Review (RN CM) Complete  Some recent data might be hidden

## 2021-04-26 NOTE — Progress Notes (Signed)
HD#8 SUBJECTIVE:  Patient Summary: Marcus [name] is a 70 year old person multiple arterial clots history including 3 myocardial infarctions, stroke in 2017, an PAD who is admitted for acute strokes of R pons and L occipital lobes.   Overnight Events: No acute events overnight.  Interim History: This is hospital day 8 for [name] who was seen and evaluated at the bedside this morning. He reports that he is feeling "so-so" today. He continues to try to use his left hand more, but it causes him significant numbness and tingling. He is able to ambulate with the assistance of PT/OT, but he is still unable to ambulate on his own. He does still have some pain in his left toe, but he is able to move it more today.   OBJECTIVE:  Vital Signs: Vitals:   04/25/21 1508 04/25/21 2100 04/26/21 0038 04/26/21 0526  BP: (!) 163/68 (!) 156/77 122/71 137/75  Pulse: 74 67 61 64  Resp: 16  14 16   Temp: 98.5 F (36.9 C) 98.4 F (36.9 C) 97.7 F (36.5 C) 97.7 F (36.5 C)  TempSrc: Oral Oral Axillary Oral  SpO2: 98% 97% 96% 98%  Weight:   91.3 kg   Height:       Supplemental O2: Room Air SpO2: 98 %  Filed Weights   04/18/21 1139 04/26/21 0038  Weight: 81.6 kg 91.3 kg     Intake/Output Summary (Last 24 hours) at 04/26/2021 04/28/2021 Last data filed at 04/25/2021 2113 Gross per 24 hour  Intake 811 ml  Output 300 ml  Net 511 ml   Net IO Since Admission: 909 mL [04/26/21 0626]  Physical Exam: General: Pleasant gentleman sitting up comfortably in bed. No acute distress. CV: RRR. No murmurs, rubs, or gallops. No LE edema Extremities: Palpable radial and 1+ DP pulses. Normal ROM. Limited ROM of left 1st metatarsal  Skin: Warm and dry. No obvious rash or lesions. Neuro: A&Ox3. Moves all extremities. Paresthesias in LUE. Strength 4+/5 in LUE.  Psych: Normal mood and affect     ASSESSMENT/PLAN:  Assessment: Principal Problem:   Acute cerebral infarction (HCC) Active Problems:    Hypertension   HLD (hyperlipidemia)   CKD (chronic kidney disease) stage 3, GFR 30-59 ml/min (HCC)   Acute CVA (cerebrovascular accident) (HCC)   Moderate episode of recurrent major depressive disorder (HCC)   Plan: #Acute infarcts of R pons and L Occipital lobe Patient will continue on DAPT with 75mg  Plavix and 81mg  ASA for 3 weeks, followed by 81mg  ASA indefinitely as tolerated. Also will continue with 80mg  Lipitor daily. Recommended by neuro to have a 30 day cardiac event monitor on discharge to rule out Afib. [name] unable to accept the patient until he is out of COVID isolation.  - Continue to work with PT/OT - SNF placement pending  #Covid-19 Diagnosis confirmed with PCR on 04/23/21. Vitals stable and afebrile. Clinically appearing well with only symptom of mild productive cough. Will continue to monitor.  #Depression Patient with history of depression which has acutely worsened after this most recent stroke. Describes "not wanting to be a burden," to his son and family who he has been living with. No active plan for suicide and was evaluated by Psych.  - Continue Celexa 20mg  and Trazodone 100mg  prn nightly.  #Constipation No longer having abdominal pain, as he has had a bowel movement. - Senna BID PRN and miralax PRN  #Left 1st metatarsal pain likely 2/2 gout flare Left big toe with no  erythema, however, the patient is experiencing pain and has limited ROM of the joint. He has had gout flares in the past and feels that his symptoms are similar to what he has experienced. - Colchicine 0.6mg  BID  - Continue current pain management with Norco 10-325 q 6hrs prn   VTE Prophylaxis: Xarelto 10 mg Diet: Regular  Prior to admission living arrangement: son's home Anticipated discharge location: SNF for acute rehab Barriers to discharge: SNF placement. Medically stable, pending COVID airborne isolation to be discontinued   Signature: Kaiah Hosea, D.O.  Internal Medicine  Resident, PGY-1 [facility]  Pager: [phone] 6:26 AM, 04/26/2021   Please contact the on call pager after 5 pm and on weekends at [phone].

## 2021-04-27 DIAGNOSIS — I639 Cerebral infarction, unspecified: Secondary | ICD-10-CM | POA: Diagnosis not present

## 2021-04-27 LAB — CBC
HCT: 41.5 % (ref 39.0–52.0)
Hemoglobin: 13.1 g/dL (ref 13.0–17.0)
MCH: 26.3 pg (ref 26.0–34.0)
MCHC: 31.6 g/dL (ref 30.0–36.0)
MCV: 83.3 fL (ref 80.0–100.0)
Platelets: 190 10*3/uL (ref 150–400)
RBC: 4.98 MIL/uL (ref 4.22–5.81)
RDW: 14.1 % (ref 11.5–15.5)
WBC: 5.5 10*3/uL (ref 4.0–10.5)
nRBC: 0 % (ref 0.0–0.2)

## 2021-04-27 MED ORDER — LISINOPRIL 20 MG PO TABS
40.0000 mg | ORAL_TABLET | Freq: Every day | ORAL | Status: DC
  Administered 2021-04-27 – 2021-05-04 (×8): 40 mg via ORAL
  Filled 2021-04-27 (×8): qty 2

## 2021-04-27 MED ORDER — METOPROLOL TARTRATE 50 MG PO TABS
50.0000 mg | ORAL_TABLET | Freq: Two times a day (BID) | ORAL | Status: DC
  Administered 2021-04-27 (×2): 50 mg via ORAL
  Filled 2021-04-27 (×2): qty 1

## 2021-04-27 NOTE — Progress Notes (Addendum)
Pt noted to have BP 173/75 and 170/94 this morning, trending 150s-170s/70s-80s past two days, HR 70s-80s. No s/s or distress. No HTN meds on MAR. MD notified by secure chat. MD team member responded will resume home meds. Will continue to monitor.

## 2021-04-27 NOTE — Plan of Care (Signed)
  Problem: Clinical Measurements: Goal: Ability to maintain clinical measurements within normal limits will improve Outcome: Progressing Goal: Will remain free from infection Outcome: Progressing Goal: Diagnostic test results will improve Outcome: Progressing Goal: Respiratory complications will improve Outcome: Progressing Goal: Cardiovascular complication will be avoided Outcome: Progressing   Problem: Coping: Goal: Level of anxiety will decrease Outcome: Progressing   Problem: Elimination: Goal: Will not experience complications related to bowel motility Outcome: Progressing Goal: Will not experience complications related to urinary retention Outcome: Progressing   

## 2021-04-27 NOTE — Plan of Care (Signed)
  Problem: Education: Goal: Knowledge of General Education information will improve Description: Including pain rating scale, medication(s)/side effects and non-pharmacologic comfort measures Outcome: Progressing   Problem: Health Behavior/Discharge Planning: Goal: Ability to manage health-related needs will improve Outcome: Progressing   Problem: Clinical Measurements: Goal: Ability to maintain clinical measurements within normal limits will improve Outcome: Progressing Goal: Will remain free from infection Outcome: Progressing Goal: Diagnostic test results will improve Outcome: Progressing Goal: Respiratory complications will improve Outcome: Progressing Goal: Cardiovascular complication will be avoided Outcome: Progressing   Problem: Activity: Goal: Risk for activity intolerance will decrease Outcome: Progressing   Problem: Nutrition: Goal: Adequate nutrition will be maintained Outcome: Progressing   Problem: Coping: Goal: Level of anxiety will decrease Outcome: Progressing   Problem: Elimination: Goal: Will not experience complications related to bowel motility Outcome: Progressing Goal: Will not experience complications related to urinary retention Outcome: Progressing   Problem: Pain Managment: Goal: General experience of comfort will improve Outcome: Progressing   Problem: Safety: Goal: Ability to remain free from injury will improve Outcome: Progressing   Problem: Skin Integrity: Goal: Risk for impaired skin integrity will decrease Outcome: Progressing   Problem: Education: Goal: Knowledge of secondary prevention will improve Outcome: Progressing Goal: Knowledge of patient specific risk factors addressed and post discharge goals established will improve Outcome: Progressing   Problem: Self-Care: Goal: Ability to participate in self-care as condition permits will improve Outcome: Progressing   Problem: Education: Goal: Knowledge of risk factors and  measures for prevention of condition will improve Outcome: Progressing   Problem: Coping: Goal: Psychosocial and spiritual needs will be supported Outcome: Progressing   Problem: Respiratory: Goal: Will maintain a patent airway Outcome: Progressing Goal: Complications related to the disease process, condition or treatment will be avoided or minimized Outcome: Progressing   

## 2021-04-27 NOTE — Progress Notes (Signed)
HD#9 SUBJECTIVE:  Patient Summary: Marcus [name] is a 70 year old person multiple arterial clots history including 3 myocardial infarctions, stroke in 2017, an PAD who is admitted for acute strokes of R pons and L occipital lobes.   Overnight Events: No acute events overnight.  Interim History: This is hospital day 9 for Marcus [name] who was seen and evaluated at the bedside this morning. He reports that he feels the same as yesterday and is still having some trouble with using his left hand.   OBJECTIVE:  Vital Signs: Vitals:   04/26/21 1510 04/26/21 2023 04/26/21 2309 04/27/21 0430  BP: (!) 154/84 (!) 156/82 (!) 150/87 (!) 141/85  Pulse: 82 70 76 69  Resp: 18 18 18    Temp: 98.3 F (36.8 C) 98.8 F (37.1 C) 98.8 F (37.1 C) 98.6 F (37 C)  TempSrc: Oral Oral  Oral  SpO2: 98% 98% 96% 98%  Weight:      Height:       Supplemental O2: Room Air SpO2: 98 %  Filed Weights   04/18/21 1139 04/26/21 0038  Weight: 81.6 kg 91.3 kg     Intake/Output Summary (Last 24 hours) at 04/27/2021 0739 Last data filed at 04/26/2021 0800 Gross per 24 hour  Intake --  Output 600 ml  Net -600 ml   Net IO Since Admission: 309 mL [04/27/21 0739]  Physical Exam: General: Pleasant gentleman sitting up comfortably in bed. No acute distress. CV: RRR. No murmurs, rubs, or gallops. No LE edema Extremities: Palpable radial and 1+ DP pulses. Normal ROM. Limited ROM of left 1st metatarsal  Skin: Warm and dry. No obvious rash or lesions. Neuro: A&Ox3. Moves all extremities. Paresthesias in LUE. Strength 4+/5 in LUE.  Psych: Normal mood and affect     ASSESSMENT/PLAN:  Assessment: Principal Problem:   Acute cerebral infarction (HCC) Active Problems:   Hypertension   HLD (hyperlipidemia)   CKD (chronic kidney disease) stage 3, GFR 30-59 ml/min (HCC)   Acute CVA (cerebrovascular accident) (HCC)   Moderate episode of recurrent major depressive disorder (HCC)   Plan: #Acute infarcts of R  pons and L Occipital lobe Patient will continue on DAPT with 75mg  Plavix and 81mg  ASA for 3 weeks, followed by 81mg  ASA indefinitely as tolerated. Also will continue with 80mg  Lipitor daily. Recommended by neuro to have a 30 day cardiac event monitor on discharge to rule out Afib. [name] unable to accept the patient until he is out of COVID isolation, which will be 8/25.  - Continue to work with PT/OT - SNF placement pending   #Hypertension Blood pressures elevated today, up to SBP of 170s. Restarted on home metoprolol 50mg  and lisinopril 40mg .  - Continue home meds  #Covid-19 Diagnosis confirmed with PCR on 04/23/21. Vitals stable and afebrile. Clinically appearing well with only symptom of mild productive cough. Will continue to monitor.   #Depression Patient with history of depression which has acutely worsened after this most recent stroke. Describes "not wanting to be a burden," to his son and family who he has been living with. No active plan for suicide and was evaluated by Psych.  - Continue Celexa 20mg  and Trazodone 100mg  prn nightly.   #Constipation No longer having abdominal pain, as he has had a bowel movement. - Senna BID PRN and miralax PRN   #Left 1st metatarsal pain likely 2/2 gout flare Left big toe with no erythema, however, the patient is experiencing pain and has limited ROM of the  joint. He has had gout flares in the past and feels that his symptoms are similar to what he has experienced. His pain remains stable.  - Colchicine 0.6mg  BID  - Continue current pain management with Norco 10-325 q 6hrs prn  Best Practice: Diet: Regular diet IVF: Fluids: none VTE: rivaroxaban (XARELTO) tablet 10 mg Start: 04/20/21 1445 Code: DNR AB: None Therapy Recs: CIR vs SNF DISPO: Anticipated discharge to Skilled nursing facility pending COVID isolation precautions .  Signature: Marcus [name], D.O.  Internal Medicine Resident, PGY-1 [name]   Pager: [phone] 7:39 AM, 04/27/2021   Please contact the on call pager after 5 pm and on weekends at [phone].

## 2021-04-27 NOTE — Plan of Care (Signed)
Problem: Education: Goal: Knowledge of General Education information will improve Description: Including pain rating scale, medication(s)/side effects and non-pharmacologic comfort measures 04/27/2021 1703 by Drue Dun, RN Outcome: Progressing 04/27/2021 1453 by Drue Dun, RN Outcome: Progressing   Problem: Health Behavior/Discharge Planning: Goal: Ability to manage health-related needs will improve 04/27/2021 1703 by Drue Dun, RN Outcome: Progressing 04/27/2021 1453 by Drue Dun, RN Outcome: Progressing   Problem: Clinical Measurements: Goal: Ability to maintain clinical measurements within normal limits will improve 04/27/2021 1703 by Drue Dun, RN Outcome: Progressing 04/27/2021 1453 by Drue Dun, RN Outcome: Progressing Goal: Will remain free from infection 04/27/2021 1703 by Drue Dun, RN Outcome: Progressing 04/27/2021 1453 by Drue Dun, RN Outcome: Progressing Goal: Diagnostic test results will improve 04/27/2021 1703 by Drue Dun, RN Outcome: Progressing 04/27/2021 1453 by Drue Dun, RN Outcome: Progressing Goal: Respiratory complications will improve 04/27/2021 1703 by Drue Dun, RN Outcome: Progressing 04/27/2021 1453 by Drue Dun, RN Outcome: Progressing Goal: Cardiovascular complication will be avoided 04/27/2021 1703 by Drue Dun, RN Outcome: Progressing 04/27/2021 1453 by Drue Dun, RN Outcome: Progressing   Problem: Activity: Goal: Risk for activity intolerance will decrease 04/27/2021 1703 by Drue Dun, RN Outcome: Progressing 04/27/2021 1453 by Drue Dun, RN Outcome: Progressing   Problem: Nutrition: Goal: Adequate nutrition will be maintained 04/27/2021 1703 by Drue Dun, RN Outcome: Progressing 04/27/2021 1453 by Drue Dun, RN Outcome: Progressing   Problem: Coping: Goal: Level of anxiety will decrease 04/27/2021 1703 by Drue Dun, RN Outcome: Progressing 04/27/2021 1453 by Drue Dun, RN Outcome: Progressing   Problem: Elimination: Goal: Will not experience complications related to bowel motility 04/27/2021 1703 by Drue Dun, RN Outcome: Progressing 04/27/2021 1453 by Drue Dun, RN Outcome: Progressing Goal: Will not experience complications related to urinary retention 04/27/2021 1703 by Drue Dun, RN Outcome: Progressing 04/27/2021 1453 by Drue Dun, RN Outcome: Progressing   Problem: Pain Managment: Goal: General experience of comfort will improve 04/27/2021 1703 by Drue Dun, RN Outcome: Progressing 04/27/2021 1453 by Drue Dun, RN Outcome: Progressing   Problem: Safety: Goal: Ability to remain free from injury will improve 04/27/2021 1703 by Drue Dun, RN Outcome: Progressing 04/27/2021 1453 by Drue Dun, RN Outcome: Progressing   Problem: Skin Integrity: Goal: Risk for impaired skin integrity will decrease 04/27/2021 1703 by Drue Dun, RN Outcome: Progressing 04/27/2021 1453 by Drue Dun, RN Outcome: Progressing   Problem: Education: Goal: Knowledge of secondary prevention will improve 04/27/2021 1703 by Drue Dun, RN Outcome: Progressing 04/27/2021 1453 by Drue Dun, RN Outcome: Progressing Goal: Knowledge of patient specific risk factors addressed and post discharge goals established will improve 04/27/2021 1703 by Drue Dun, RN Outcome: Progressing 04/27/2021 1453 by Drue Dun, RN Outcome: Progressing   Problem: Self-Care: Goal: Ability to participate in self-care as condition permits will improve 04/27/2021 1703 by Drue Dun, RN Outcome: Progressing 04/27/2021 1453 by Drue Dun, RN Outcome: Progressing   Problem: Education: Goal: Knowledge of risk factors and measures for prevention of condition will improve 04/27/2021 1703 by Drue Dun, RN Outcome:  Progressing 04/27/2021 1453 by Drue Dun, RN Outcome: Progressing   Problem: Coping: Goal: Psychosocial and spiritual needs will be supported 04/27/2021 1703 by Drue Dun, RN Outcome: Progressing 04/27/2021 1453 by Drue Dun, RN Outcome: Progressing   Problem: Respiratory: Goal: Will maintain a patent airway 04/27/2021 1703 by Drue Dun, RN Outcome: Progressing 04/27/2021 1453 by Drue Dun, RN Outcome: Progressing Goal: Complications related to the disease process, condition or treatment will be avoided or minimized 04/27/2021 1703  by Drue Dun, RN Outcome: Progressing 04/27/2021 1453 by Drue Dun, RN Outcome: Progressing

## 2021-04-28 MED ORDER — METOPROLOL TARTRATE 25 MG PO TABS
25.0000 mg | ORAL_TABLET | Freq: Two times a day (BID) | ORAL | Status: DC
  Administered 2021-04-28 (×2): 25 mg via ORAL
  Filled 2021-04-28 (×2): qty 1

## 2021-04-28 NOTE — Progress Notes (Signed)
HD#9 SUBJECTIVE:  Patient Summary: Marcus [name] is a 70 year old person multiple arterial clots history including 3 myocardial infarctions, stroke in 2017, an PAD who is admitted for acute strokes of R pons and L occipital lobes.   Overnight Events: No acute overnight events.  Interim History:   Marcus [name] was seen and evaluated at bedside. States that he feels good, but still has some lingering numbness in left hand. However, he has been able to use it grab utensils when he is eating so does note some improvement.  States that the gout medicine has been allowing him to bend his foot better.  OBJECTIVE:  Vital Signs: Vitals:   04/27/21 2047 04/28/21 0039 04/28/21 0440 04/28/21 0828  BP: (!) 156/81 (!) 113/57 106/71 (!) 141/79  Pulse: (!) 55 (!) 55 (!) 58   Resp: 18  16 16   Temp: 98.1 F (36.7 C) 97.8 F (36.6 C) 97.7 F (36.5 C) 98 F (36.7 C)  TempSrc: Oral Oral Oral   SpO2: 100% 97% 99% 98%  Weight:      Height:       Supplemental O2: Room Air SpO2: 98 %  Filed Weights   04/18/21 1139 04/26/21 0038  Weight: 81.6 kg 91.3 kg    No intake or output data in the 24 hours ending 04/28/21 0924  Net IO Since Admission: 309 mL [04/28/21 0924]  Physical Exam: General: pleasant elderly male, sitting up in bed eating breakfast, NAD. CV: normal rate and regular rhythm, no m/r/g.  Pulm: normal work of breathing on room air, no respiratory distress noted. MSK: no peripheral edema noted. Skin: warm and dry. Neuro: AAOx4, strength 5/5 bilaterally. Paresthesias in LUE. Psych: normal mood and affect.   ASSESSMENT/PLAN:  Assessment: Principal Problem:   Acute cerebral infarction [facility]) Active Problems:   Hypertension   HLD (hyperlipidemia)   CKD (chronic kidney disease) stage 3, GFR 30-59 ml/min (HCC)   Acute CVA (cerebrovascular accident) (HCC)   Moderate episode of recurrent major depressive disorder (HCC)   Plan: #Acute infarcts of R pons and L Occipital  lobe Patient will continue on DAPT with 75mg  Plavix and 81mg  ASA for 3 weeks, followed by 81mg  ASA indefinitely as tolerated. Also will continue with 80mg  Lipitor daily. Recommended by neuro to have a 30 day cardiac event monitor on discharge to rule out Afib. [name] unable to accept the patient until he is out of COVID isolation, which will be 8/25.  - Continue to work with PT/OT - SNF placement pending   #Hypertension Blood pressures elevated today, up to SBP of 170s. Restarted on home metoprolol 50mg  BID and lisinopril 40mg . Systolic BP in 100s this AM along with asymptomatic sinus bradycardia (50s), so will decrease home metoprolol dose to 25mg  BID. - continue lisinopril - decreased metoprolol to 25mg  BID  #Covid-19 Diagnosis confirmed with PCR on 04/23/21. Vitals stable and afebrile. Clinically appearing well with only symptom of mild productive cough. Will continue to monitor.   #Depression Patient with history of depression which has acutely worsened after this most recent stroke. Describes "not wanting to be a burden," to his son and family who he has been living with. No active plan for suicide and was evaluated by Psych.  - Continue Celexa 20mg  and Trazodone 100mg  prn nightly.   #Constipation No longer having abdominal pain, as he has had a bowel movement. - Senna BID PRN and miralax PRN   #Left 1st metatarsal pain likely 2/2 gout flare He has  had gout flares in the past and feels that his symptoms are similar to what he has experienced. Reports improvement with colchicine. Pain is well controlled with current regimen. - Colchicine 0.6mg  BID  - Continue current pain management with Norco 10-325 q 6hrs prn  Best Practice: Diet: Regular diet IVF: Fluids: none VTE: rivaroxaban (XARELTO) tablet 10 mg Start: 04/20/21 1445 Code: DNR AB: None Therapy Recs: CIR vs SNF DISPO: Anticipated discharge to Skilled nursing facility pending COVID isolation precautions  .  Signature: Merrilyn Puma, MD Internal Medicine Resident, PGY-2 [facility]  Pager: [phone] 9:24 AM, 04/28/2021   Please contact the on call pager after 5 pm and on weekends at [phone].

## 2021-04-28 NOTE — Plan of Care (Signed)
  Problem: Education: Goal: Knowledge of General Education information will improve Description: Including pain rating scale, medication(s)/side effects and non-pharmacologic comfort measures Outcome: Progressing   Problem: Health Behavior/Discharge Planning: Goal: Ability to manage health-related needs will improve Outcome: Progressing   Problem: Clinical Measurements: Goal: Ability to maintain clinical measurements within normal limits will improve Outcome: Progressing Goal: Will remain free from infection Outcome: Progressing Goal: Diagnostic test results will improve Outcome: Progressing Goal: Respiratory complications will improve Outcome: Progressing Goal: Cardiovascular complication will be avoided Outcome: Progressing   Problem: Activity: Goal: Risk for activity intolerance will decrease Outcome: Progressing   Problem: Nutrition: Goal: Adequate nutrition will be maintained Outcome: Progressing   Problem: Coping: Goal: Level of anxiety will decrease Outcome: Progressing   Problem: Elimination: Goal: Will not experience complications related to bowel motility Outcome: Progressing Goal: Will not experience complications related to urinary retention Outcome: Progressing   Problem: Pain Managment: Goal: General experience of comfort will improve Outcome: Progressing   Problem: Safety: Goal: Ability to remain free from injury will improve Outcome: Progressing   Problem: Skin Integrity: Goal: Risk for impaired skin integrity will decrease Outcome: Progressing   Problem: Education: Goal: Knowledge of secondary prevention will improve Outcome: Progressing Goal: Knowledge of patient specific risk factors addressed and post discharge goals established will improve Outcome: Progressing   Problem: Self-Care: Goal: Ability to participate in self-care as condition permits will improve Outcome: Progressing   Problem: Education: Goal: Knowledge of risk factors and  measures for prevention of condition will improve Outcome: Progressing   Problem: Coping: Goal: Psychosocial and spiritual needs will be supported Outcome: Progressing   Problem: Respiratory: Goal: Will maintain a patent airway Outcome: Progressing Goal: Complications related to the disease process, condition or treatment will be avoided or minimized Outcome: Progressing   

## 2021-04-28 NOTE — Progress Notes (Signed)
Very upset and slightly confused about his meals.  Said he did not get breakfast or lunch.  Was reminded that this nurse did remove the trays from each meal and that he still had the receipts on his table from the meals.  He also complained that he did not get his supper meal either and that it was supposed to be here at 1830.  He was reminded that it was just 1835 at the time and I called the front desk to see if his tray was there and it was and they brought it to him immediately.

## 2021-04-29 DIAGNOSIS — I639 Cerebral infarction, unspecified: Secondary | ICD-10-CM | POA: Diagnosis not present

## 2021-04-29 MED ORDER — METOPROLOL TARTRATE 12.5 MG HALF TABLET
12.5000 mg | ORAL_TABLET | Freq: Two times a day (BID) | ORAL | Status: DC
  Administered 2021-04-29: 12.5 mg via ORAL
  Filled 2021-04-29: qty 1

## 2021-04-29 NOTE — Plan of Care (Signed)
  Problem: Education: Goal: Knowledge of General Education information will improve Description: Including pain rating scale, medication(s)/side effects and non-pharmacologic comfort measures Outcome: Progressing   Problem: Health Behavior/Discharge Planning: Goal: Ability to manage health-related needs will improve Outcome: Progressing   Problem: Clinical Measurements: Goal: Ability to maintain clinical measurements within normal limits will improve Outcome: Progressing Goal: Will remain free from infection Outcome: Progressing Goal: Diagnostic test results will improve Outcome: Progressing Goal: Respiratory complications will improve Outcome: Progressing Goal: Cardiovascular complication will be avoided Outcome: Progressing   Problem: Activity: Goal: Risk for activity intolerance will decrease Outcome: Progressing   Problem: Nutrition: Goal: Adequate nutrition will be maintained Outcome: Progressing   Problem: Coping: Goal: Level of anxiety will decrease Outcome: Progressing   Problem: Elimination: Goal: Will not experience complications related to bowel motility Outcome: Progressing Goal: Will not experience complications related to urinary retention Outcome: Progressing   Problem: Pain Managment: Goal: General experience of comfort will improve Outcome: Progressing   Problem: Safety: Goal: Ability to remain free from injury will improve Outcome: Progressing   Problem: Skin Integrity: Goal: Risk for impaired skin integrity will decrease Outcome: Progressing   Problem: Education: Goal: Knowledge of secondary prevention will improve Outcome: Progressing Goal: Knowledge of patient specific risk factors addressed and post discharge goals established will improve Outcome: Progressing   Problem: Self-Care: Goal: Ability to participate in self-care as condition permits will improve Outcome: Progressing   Problem: Education: Goal: Knowledge of risk factors and  measures for prevention of condition will improve Outcome: Progressing   Problem: Coping: Goal: Psychosocial and spiritual needs will be supported Outcome: Progressing   Problem: Respiratory: Goal: Will maintain a patent airway Outcome: Progressing Goal: Complications related to the disease process, condition or treatment will be avoided or minimized Outcome: Progressing

## 2021-04-29 NOTE — Progress Notes (Signed)
HD#9 SUBJECTIVE:  Patient Summary: Marcus [name] is a 70 year old person multiple arterial clots history including 3 myocardial infarctions, stroke in 2017, an PAD who is admitted for acute strokes of R pons and L occipital lobes.   Overnight Events: No acute overnight events.  Interim History:   Marcus [name] states that he was not able to sleep well last night as he was thinking about his wife. Otherwise, he feels fine this morning. Still having some numbness in left hand and is not able to put too much weight on left leg. He realizes that he needs more rehabilitation for strength training so he can get back to how he used to be.   OBJECTIVE:  Vital Signs: Vitals:   04/28/21 2100 04/28/21 2300 04/29/21 0504 04/29/21 0919  BP: (!) 154/74 138/73 119/69 (!) 145/66  Pulse: (!) 58 (!) 58 (!) 45 (!) 55  Resp: 18 18 16    Temp: 98 F (36.7 C) 98.1 F (36.7 C) (!) 97.5 F (36.4 C) 98.2 F (36.8 C)  TempSrc:  Oral Oral Oral  SpO2: 98% 96% 98% 99%  Weight:      Height:       Supplemental O2: Room Air SpO2: 99 %  Filed Weights   04/18/21 1139 04/26/21 0038  Weight: 81.6 kg 91.3 kg     Intake/Output Summary (Last 24 hours) at 04/29/2021 1053 Last data filed at 04/29/2021 0900 Gross per 24 hour  Intake --  Output 2176 ml  Net -2176 ml    Net IO Since Admission: -1,867 mL [04/29/21 1053]  Physical Exam: General: pleasant elderly male, sitting up in chair, NAD. CV: normal rate and regular rhythm, no m/r/g. Pulm: normal work of breathing on RA, no respiratory distress noted. Skin: warm and dry. Neuro: AAOx4, strength intact bilaterally but having paresthesias with movement of left arm and left left. Psych: calm and cooperative.   ASSESSMENT/PLAN:  Assessment: Principal Problem:   Acute cerebral infarction [facility]) Active Problems:   Hypertension   HLD (hyperlipidemia)   CKD (chronic kidney disease) stage 3, GFR 30-59 ml/min (HCC)   Acute CVA (cerebrovascular accident)  (HCC)   Moderate episode of recurrent major depressive disorder (HCC)   Plan: #Acute infarcts of R pons and L Occipital lobe Patient will continue on DAPT with 75mg  Plavix and 81mg  ASA for 3 weeks, followed by 81mg  ASA indefinitely as tolerated. Also will continue with 80mg  Lipitor daily. Recommended by neuro to have a 30 day cardiac event monitor on discharge to rule out Afib. [name] unable to accept the patient until he is out of COVID isolation, which will be 8/25. However, patient may be candidate for CIR so will place consult to CIR. - Continue to work with PT/OT - placed consult to CIR per PT/OT recs   #Hypertension Restarted home metoprolol 50mg  BID and lisinopril 40mg . Continuing to have asymptomatic sinus bradycardia in 50s. Will stop metoprolol at this time. - continue lisinopril - discontinued metoprolol  #Covid-19 Diagnosis confirmed with PCR on 04/23/21. Vitals stable and afebrile. Clinically appearing well with only symptom of mild productive cough. Will continue to monitor.   #Depression Patient with history of depression which has acutely worsened after this most recent stroke. Describes "not wanting to be a burden," to his son and family who he has been living with. No active plan for suicide and was evaluated by Psych.  - Continue Celexa 20mg  and Trazodone 100mg  prn nightly.   #Constipation No longer having abdominal pain. -  Senna BID PRN and miralax PRN   #Left 1st metatarsal pain likely 2/2 gout flare He has had gout flares in the past and feels that his symptoms are similar to what he has experienced. Reports improvement with colchicine. Pain is well controlled with current regimen. - Colchicine 0.6mg  BID  - Continue current pain management with Norco 10-325 q 6hrs prn  Best Practice: Diet: Regular diet VTE: rivaroxaban (XARELTO) tablet 10 mg Start: 04/20/21 1445 Code: DNR Therapy Recs: CIR vs SNF DISPO: Anticipated discharge pending COVID isolation  precautions .  Signature: Merrilyn Puma, MD Internal Medicine Resident, PGY-2 [facility]  Pager: [phone] 10:53 AM, 04/29/2021   Please contact the on call pager after 5 pm and on weekends at [phone].

## 2021-04-30 DIAGNOSIS — I639 Cerebral infarction, unspecified: Secondary | ICD-10-CM | POA: Diagnosis not present

## 2021-04-30 NOTE — Plan of Care (Signed)
  Problem: Education: Goal: Knowledge of General Education information will improve Description: Including pain rating scale, medication(s)/side effects and non-pharmacologic comfort measures Outcome: Progressing   Problem: Health Behavior/Discharge Planning: Goal: Ability to manage health-related needs will improve Outcome: Progressing   Problem: Clinical Measurements: Goal: Ability to maintain clinical measurements within normal limits will improve Outcome: Progressing Goal: Will remain free from infection Outcome: Progressing Goal: Diagnostic test results will improve Outcome: Progressing Goal: Respiratory complications will improve Outcome: Progressing Goal: Cardiovascular complication will be avoided Outcome: Progressing   Problem: Activity: Goal: Risk for activity intolerance will decrease Outcome: Progressing   Problem: Nutrition: Goal: Adequate nutrition will be maintained Outcome: Progressing   Problem: Coping: Goal: Level of anxiety will decrease Outcome: Progressing   Problem: Elimination: Goal: Will not experience complications related to bowel motility Outcome: Progressing Goal: Will not experience complications related to urinary retention Outcome: Progressing   Problem: Pain Managment: Goal: General experience of comfort will improve Outcome: Progressing   Problem: Safety: Goal: Ability to remain free from injury will improve Outcome: Progressing   Problem: Skin Integrity: Goal: Risk for impaired skin integrity will decrease Outcome: Progressing   Problem: Education: Goal: Knowledge of secondary prevention will improve Outcome: Progressing Goal: Knowledge of patient specific risk factors addressed and post discharge goals established will improve Outcome: Progressing   Problem: Self-Care: Goal: Ability to participate in self-care as condition permits will improve Outcome: Progressing   Problem: Education: Goal: Knowledge of risk factors and  measures for prevention of condition will improve Outcome: Progressing   Problem: Coping: Goal: Psychosocial and spiritual needs will be supported Outcome: Progressing   Problem: Respiratory: Goal: Will maintain a patent airway Outcome: Progressing Goal: Complications related to the disease process, condition or treatment will be avoided or minimized Outcome: Progressing   

## 2021-04-30 NOTE — Progress Notes (Signed)
HD#12 SUBJECTIVE:  Patient Summary: Marcus [name] is a 70 year old person multiple arterial clots history including 3 myocardial infarctions, stroke in 2017, an PAD who is admitted for acute strokes of R pons and L occipital lobes.   Overnight Events: No acute events overnight   Interim History: This is hospital day 12 for Marcus [name] who was seen and evaluated at the bedside this morning. He reports that is he feeling well today and he is ready to get to a SNF for rehab. His pain in his left toe is well controlled and his ROM has improved. He continues to endorse numbness in tingling in his left upper extremity, although this has not changed compared to previous days.   OBJECTIVE:  Vital Signs: Vitals:   04/29/21 1500 04/29/21 2000 04/30/21 0010 04/30/21 0423  BP: 119/79 125/70 126/71 (!) 143/70  Pulse: 62 67 (!) 54 69  Resp:  15 13 16   Temp: 98 F (36.7 C) 97.9 F (36.6 C) 97.9 F (36.6 C) 98.2 F (36.8 C)  TempSrc: Oral Oral Oral Oral  SpO2: 99% 99% 97% 100%  Weight:      Height:       Supplemental O2: Room Air SpO2: 100 %  Filed Weights   04/18/21 1139 04/26/21 0038  Weight: 81.6 kg 91.3 kg     Intake/Output Summary (Last 24 hours) at 04/30/2021 0604 Last data filed at 04/30/2021 0400 Gross per 24 hour  Intake 480 ml  Output 1400 ml  Net -920 ml   Net IO Since Admission: -2,186 mL [04/30/21 0604]  Physical Exam: General: pleasant elderly male, laying in bed, NAD. CV: normal rate and regular rhythm, no m/r/g. Pulm: normal work of breathing on RA, no respiratory distress noted. Skin: warm and dry. Neuro: AAOx4, strength intact bilaterally but having paresthesias with movement of left arm and left leg. Psych: calm and cooperative.    ASSESSMENT/PLAN:  Assessment: Principal Problem:   Acute cerebral infarction [facility]) Active Problems:   Hypertension   HLD (hyperlipidemia)   CKD (chronic kidney disease) stage 3, GFR 30-59 ml/min (HCC)   Acute CVA  (cerebrovascular accident) (HCC)   Moderate episode of recurrent major depressive disorder (HCC)  Plan: #Acute infarcts of R pons and L Occipital lobe Patient will continue on DAPT with 75mg  Plavix and 81mg  ASA for 3 weeks, followed by 81mg  ASA indefinitely as tolerated. Also will continue with 80mg  Lipitor daily. Recommended by neuro to have a 30 day cardiac event monitor on discharge to rule out Afib. [name] unable to accept the patient until he is out of COVID isolation, which will be 8/25. CIR consult placed, although patient's home situation after discharge is up in the air, as he does not want to go back to his son's home.  - Continue to work with PT/OT - CIR vs SNF pending   #Hypertension Restarted home lisinopril 40mg . Home dose of metoprolol discontinued on 8/21 due to bradycardia in the 50s.  - Continue lisinopril  #Covid-19 Diagnosis confirmed with PCR on 04/23/21. Vitals stable and afebrile. Clinically appearing well with only symptom of mild productive cough. Will continue to monitor and airborne precautions will be lifted on 8/25.    #Depression Patient with history of depression which has acutely worsened after this most recent stroke. Describes "not wanting to be a burden," to his son and family who he has been living with. No active plan for suicide and was evaluated by Psych.  - Continue Celexa 20mg  and  Trazodone 100mg  prn nightly.   #Gout in left great toe Patient has had gout flares in the past and feels that his symptoms are similar to what he has experienced. Reports improvement with colchicine and his pain is well controlled with current regimen. - Colchicine 0.6mg  BID, will continue until pain free x48 hours - Continue current pain management with Norco 10-325 q 6hrs prn  Best Practice: Diet: Regular diet IVF: Fluids: none VTE: rivaroxaban (XARELTO) tablet 10 mg Start: 04/20/21 1445 Code: DNR AB: none Therapy Recs: SNF vs CIR, DME: walker DISPO: Anticipated  discharge in 3-4 days to Skilled nursing facility pending  COVID airborne precautions .  Signature: 06/20/21, D.O.  Internal Medicine Resident, PGY-1 [facility]  Pager: [phone] 6:04 AM, 04/30/2021   Please contact the on call pager after 5 pm and on weekends at [phone].

## 2021-04-30 NOTE — Progress Notes (Signed)
Physical Therapy Treatment Patient Details Name: Marcus [name] MRN: [id number] DOB: 1950/10/12 Today's Date: 04/30/2021    History of Present Illness 70 yo male presenting to ED on 8/10 with L sided weakness and ataxic gait. MRI showing acute R pons and L occipital infarcts. PMH including GERD, depression, CHF, HLD, HTN, CKD III, severe PAD s/p Fem/Pop bypass 2021, CAD s/p MI in 3/22 and CABG, PCI to right coronary artery 3/22, ankylosing spondylitis of spine, and subacute left pontine infarct in 2017    PT Comments    Today's skilled session continued to focus on mobility progression. Pt continues to need cues for safety, however less overall assistance this session required with mobility. Acute PT to continue during pt's hospital stay.    Follow Up Recommendations  CIR;Supervision/Assistance - 24 hour (CIR vs SNF, looking more like SNF due to decreased support at home)     Equipment Recommendations  Rolling walker with 5" wheels    Precautions / Restrictions Precautions Precautions: Fall Precaution Comments: Airborne/Contact Restrictions Weight Bearing Restrictions: No    Mobility  Bed Mobility Overal bed mobility: Needs Assistance Bed Mobility: Supine to Sit     Supine to sit: Supervision;HOB elevated     General bed mobility comments: supervision, no assist needed with HOB 30 degrees    Transfers Overall transfer level: Needs assistance Equipment used: None Transfers: Sit to/from Stand Sit to Stand: Min assist         General transfer comment: from bed in lowest position pt able to stand up with min assist, able to stabilize in standing with no UE support, min guard to min assist. once pt engaged in activity needed UE support on PTA standing in from of him.  Ambulation/Gait Ambulation/Gait assistance: Min assist Gait Distance (Feet): 30 Feet Assistive device: 1 person hand held assist Gait Pattern/deviations: Decreased step length - left;Decreased stance  time - left;Decreased stride length;Trunk flexed;Step-to pattern;Ataxic;Decreased weight shift to left;Staggering left Gait velocity: decreased   General Gait Details: PTA in front of pt with pt's holding PTA's shoulders. able to provide facilation through pelvis for weight shifting onto left LE, no buckling noted. cues to "step to my foot" to work on step length and step placement due to ataxia with improved base of support and balance noted with gait.       Modified Rankin (Stroke Patients Only) Modified Rankin (Stroke Patients Only) Pre-Morbid Rankin Score: Slight disability Modified Rankin: Moderately severe disability     Balance Overall balance assessment: Needs assistance Sitting-balance support: Feet unsupported;No upper extremity supported Sitting balance-Leahy Scale: Good Sitting balance - Comments: no balance loss at EOB to don second gown, have RN remove telemetry box/wires with intermittent periods of no UE support   Standing balance support: Bilateral upper extremity supported;During functional activity Standing balance-Leahy Scale: Poor Standing balance comment: standing with bil UE support- alternating slow marching in place with staggering stepping strategy for balance recovery. cues to slow down and faclitation provided for weight shifting with improved techniqe/balance noted. mini squats x 6 reps. Attempted to have pt perform heel raises with pt unable to return demo in standing with support and cues provided.                            Cognition Arousal/Alertness: Awake/alert Behavior During Therapy: WFL for tasks assessed/performed;Flat affect Overall Cognitive Status: Impaired/Different from baseline Area of Impairment: Attention;Safety/judgement  Current Attention Level: Alternating   Following Commands: Follows multi-step commands inconsistently;Follows one step commands consistently Safety/Judgement: Decreased awareness  of safety;Decreased awareness of deficits Awareness: Emergent Problem Solving: Slow processing;Requires verbal cues General Comments: cues needed to task and for safety thoughout session       Pertinent Vitals/Pain Pain Assessment: No/denies pain Pain Score: 0-No pain     PT Goals (current goals can now be found in the care plan section) Acute Rehab PT Goals Patient Stated Goal: "To get back to normal" PT Goal Formulation: With patient Time For Goal Achievement: 05/03/21 Potential to Achieve Goals: Good Progress towards PT goals: Progressing toward goals    Frequency    Min 3X/week      PT Plan Current plan remains appropriate    AM-PAC PT "6 Clicks" Mobility   Outcome Measure  Help needed turning from your back to your side while in a flat bed without using bedrails?: A Little Help needed moving from lying on your back to sitting on the side of a flat bed without using bedrails?: A Little Help needed moving to and from a bed to a chair (including a wheelchair)?: A Little Help needed standing up from a chair using your arms (e.g., wheelchair or bedside chair)?: A Little Help needed to walk in hospital room?: A Lot Help needed climbing 3-5 steps with a railing? : Total 6 Click Score: 15    End of Session Equipment Utilized During Treatment: Gait belt Activity Tolerance: Patient tolerated treatment well Patient left: in chair;with call bell/phone within reach;with chair alarm set Nurse Communication: Mobility status PT Visit Diagnosis: Unsteadiness on feet (R26.81);Muscle weakness (generalized) (M62.81);Other abnormalities of gait and mobility (R26.89);Difficulty in walking, not elsewhere classified (R26.2);Ataxic gait (R26.0);Other symptoms and signs involving the nervous system (R29.898)     Time: 9629-5284 PT Time Calculation (min) (ACUTE ONLY): 21 min  Charges:  $Neuromuscular Re-education: 8-22 mins                    Sallyanne Kuster, PTA, [facility] Acute Rehab  Services [phone] 04/30/21, 12:17 PM    Sallyanne Kuster 04/30/2021, 12:16 PM

## 2021-04-30 NOTE — Progress Notes (Signed)
Inpatient Rehab Admissions Coordinator:   Physician requests re-consult stating that Pt. Is making progress and therapy has updated recs to CIR. SNFs are unable to take him due to COVID status. Patients are eligible to be considered for admit to the [facility] when cleared from airborne precautions by Acute MD, or Infectious Disease MD.  Otherwise, they will need to be >20 days from their positive test with recovery/improvement in symptoms or 2 negative tests.     Megan Salon, MS, CCC-SLP Rehab Admissions Coordinator  [phone] (celll) [phone] (office)

## 2021-05-01 DIAGNOSIS — I639 Cerebral infarction, unspecified: Secondary | ICD-10-CM | POA: Diagnosis not present

## 2021-05-01 NOTE — Progress Notes (Signed)
HD#13 SUBJECTIVE:  Patient Summary: Marcus [name] is a 70 year old person multiple arterial clots history including 3 myocardial infarctions, stroke in 2017, an PAD who is admitted for acute strokes of R pons and L occipital lobes and is pending SNF placement.   Overnight Events: No acute events overnight.  Interim History: This is hospital day 32 for Marcus [name] who was seen and evaluated at the bedside this morning. He remains clinically stable and reports that he continues to work with PT/OT. His pain in his left great toe is improved with gout treatment. Also updated the patient that his SNF is currently unable to accept him, as they have no beds available this whole week. Will work with TOC to find new placement.  OBJECTIVE:  Vital Signs: Vitals:   04/30/21 1540 04/30/21 1947 04/30/21 2323 05/01/21 0428  BP: 139/70 (!) 156/67 137/79 (!) 153/66  Pulse: 66 (!) 55 65 (!) 58  Resp: 18 19 18 17   Temp: 97.8 F (36.6 C) (!) 97.4 F (36.3 C) 98.4 F (36.9 C) 98.9 F (37.2 C)  TempSrc: Oral Oral    SpO2: 98% 97% 97% 98%  Weight:      Height:       Supplemental O2: Room Air SpO2: 98 %  Filed Weights   04/18/21 1139 04/26/21 0038  Weight: 81.6 kg 91.3 kg     Intake/Output Summary (Last 24 hours) at 05/01/2021 0556 Last data filed at 04/30/2021 1812 Gross per 24 hour  Intake 474 ml  Output 325 ml  Net 149 ml   Net IO Since Admission: -2,037 mL [05/01/21 0556]  Physical Exam: General: pleasant elderly male, laying in bed, NAD. CV: normal rate and regular rhythm, no m/r/g. Pulm: normal work of breathing on RA, no respiratory distress noted. Skin: warm and dry. Neuro: AAOx4, strength intact bilaterally but having paresthesias with movement of left arm and left leg. Psych: calm and cooperative.    ASSESSMENT/PLAN:  Assessment: Principal Problem:   Acute cerebral infarction [facility]) Active Problems:   Hypertension   HLD (hyperlipidemia)   CKD (chronic kidney disease)  stage 3, GFR 30-59 ml/min (HCC)   Acute CVA (cerebrovascular accident) (HCC)   Moderate episode of recurrent major depressive disorder (HCC)   Plan: #Acute infarcts of R pons and L Occipital lobe Patient will continue on DAPT with 75mg  Plavix and 81mg  ASA for 3 weeks (until 9/1), followed by 81mg  ASA indefinitely as tolerated. Also will continue with 80mg  Lipitor daily. Recommended by neuro to have a 30 day cardiac event monitor on discharge to rule out Afib. [name] unable to accept the patient until he is out of COVID isolation, which will be 8/25, however, they do not have any beds available this week. CIR evaluated the patient and he would need to be >20 days out of COVID diagnosis to go to CIR, thus will pursue SNF on discharge. Will work with TOC to find new SNF placement for the patient.  - Continue to work with PT/OT - SNF placement pending   #Hypertension Restarted home lisinopril 40mg . Home dose of metoprolol discontinued on 8/21 due to bradycardia in the 50s.  - Continue lisinopril   #Covid-19 Diagnosis confirmed with PCR on 04/23/21. Vitals stable and afebrile. Clinically appearing well with only symptom of mild productive cough. Will continue to monitor and airborne precautions will be lifted on 8/25.    #Depression Patient with history of depression which has acutely worsened after this most recent stroke. Describes "not  wanting to be a burden," to his son and family who he has been living with. No active plan for suicide and was evaluated by Psych.  - Continue Celexa 20mg  and Trazodone 100mg  prn nightly.   #Gout in left great toe Patient reports improvement with colchicine and his pain is well controlled with current regimen.  - Colchicine 0.6mg  BID, will continue until pain free x48 hours - Continue current pain management with Norco 10-325 q 6hrs prn   Best Practice: Diet: Regular diet IVF: Fluids: none VTE: rivaroxaban (XARELTO) tablet 10 mg Start: 04/20/21  1445 Code: DNR AB: none Therapy Recs: SNF vs CIR, DME: walker DISPO: Anticipated discharge in 2-3 days to Skilled nursing facility pending  COVID airborne precautions .  Signature: , D.O.  Internal Medicine Resident, PGY-1 06/20/21 Internal Medicine Residency  Pager: [phone] 5:56 AM, 05/01/2021   Please contact the on call pager after 5 pm and on weekends at [phone].

## 2021-05-01 NOTE — Progress Notes (Signed)
Inpatient Rehabilitation Admissions Coordinator   Previous assessment for Cir admit per Estill Dooms, Admissions Coordinator, on 8/15. Pateint could benefit from CIR admit, but does not have the projected 24/7 supervision that he will need. SNF is recommended at this time. We will not pursue CIR admit.  Ottie Glazier, RN, MSN Rehab Admissions Coordinator [phone] 05/01/2021 3:34 PM

## 2021-05-01 NOTE — Plan of Care (Signed)

## 2021-05-02 DIAGNOSIS — I10 Essential (primary) hypertension: Secondary | ICD-10-CM

## 2021-05-02 DIAGNOSIS — I639 Cerebral infarction, unspecified: Secondary | ICD-10-CM | POA: Diagnosis not present

## 2021-05-02 DIAGNOSIS — E7800 Pure hypercholesterolemia, unspecified: Secondary | ICD-10-CM | POA: Diagnosis not present

## 2021-05-02 NOTE — TOC Progression Note (Addendum)
Transition of Care [facility]) - Progression Note    Patient Details  Name: Marcus [name] MRN: [id number] Date of Birth: 10/12/1950  Transition of [facility]) CM/SW Contact  Erin Sons, [place] Phone Number: 05/02/2021, 10:23 AM  Clinical Narrative:     [name] does not have beds available. CSW called pt on room phone to get alternative SNF choice. Pt chooses [facility]. CSW contacted [facility] to confirm offer; awaiting response.   1139: Spoke with Kitty at [facility]; since pt is not covid boosted he will require a private room. She will check if any private rooms available and contact CSW back.   1235: Kitty confirmed they can accept pt tomorrow. Auth to be started.   Expected Discharge Plan: Skilled Nursing Facility Barriers to Discharge: Awaiting State Approval [facility]), Active Substance Use - Placement, SNF Pending bed offer  Expected Discharge Plan and Services Expected Discharge Plan: Skilled Nursing Facility In-house Referral: Clinical Social Work Discharge Planning Services: CM Consult Post Acute Care Choice: IP Rehab Living arrangements for the past 2 months: Single Family Home                                       Social Determinants of Health (SDOH) Interventions    Readmission Risk Interventions Readmission Risk Prevention Plan 12/09/2019  Transportation Screening Complete  PCP or Specialist Appt within 5-7 Days Complete  Home Care Screening Complete  Medication Review (RN CM) Complete  Some recent data might be hidden

## 2021-05-02 NOTE — Progress Notes (Signed)
Occupational Therapy Treatment Patient Details Name: Marcus [name] MRN: [id number] DOB: 12/04/1950 Today's Date: 05/02/2021    History of present illness 70 yo male presenting to ED on 8/10 with L sided weakness and ataxic gait. MRI showing acute R pons and L occipital infarcts. PMH including GERD, depression, CHF, HLD, HTN, CKD III, severe PAD s/p Fem/Pop bypass 2021, CAD s/p MI in 3/22 and CABG, PCI to right coronary artery 3/22, ankylosing spondylitis of spine, and subacute left pontine infarct in 2017.   OT comments  Patient with fair progress toward patient focused goals.  Overall, he is incorporating his left hand/upper extremity with all functional tasks.  He is needing up to Min A for ADL completion from a sit/stand level, primarily safety cueing and balance support.  Deficits to independence are listed below.  OT will continue to follow in the acute setting to maximize functional status, however, SNF is needed post acute.  He is too unsteady to return home alone.    Follow Up Recommendations  SNF    Equipment Recommendations       Recommendations for Other Services      Precautions / Restrictions Precautions Precautions: Fall Precaution Comments: Airborne/Contact Restrictions Weight Bearing Restrictions: No       Mobility Bed Mobility Overal bed mobility: Needs Assistance Bed Mobility: Supine to Sit     Supine to sit: Min assist       Patient Response: Cooperative  Transfers Overall transfer level: Needs assistance Equipment used: Rolling walker (2 wheeled) Transfers: Sit to/from Stand Sit to Stand: Min guard              Balance Overall balance assessment: Needs assistance Sitting-balance support: Feet unsupported;No upper extremity supported Sitting balance-Leahy Scale: Fair     Standing balance support: Bilateral upper extremity supported;During functional activity Standing balance-Leahy Scale: Poor                             ADL  either performed or assessed with clinical judgement   ADL Overall ADL's : Needs assistance/impaired     Grooming: Min guard;Minimal assistance;Standing;Wash/dry hands;Wash/dry face;Oral care Grooming Details (indicate cue type and reason): balance assist - shaving     Lower Body Bathing: Minimal assistance;Sit to/from stand       Lower Body Dressing: Minimal assistance;Sitting/lateral leans Lower Body Dressing Details (indicate cue type and reason): donning socks Toilet Transfer: Min guard;Regular Toilet;RW;Ambulation           Functional mobility during ADLs: Min guard;Rolling walker                         Cognition Arousal/Alertness: Awake/alert Behavior During Therapy: WFL for tasks assessed/performed Overall Cognitive Status: Impaired/Different from baseline                           Safety/Judgement: Decreased awareness of safety;Decreased awareness of deficits     General Comments: cues needed to task and for safety thoughout session        Exercises     Shoulder Instructions       General Comments      Pertinent Vitals/ Pain       Pain Assessment: Faces Faces Pain Scale: Hurts little more Pain Location: L calf Pain Descriptors / Indicators: Stabbing Pain Intervention(s): Monitored during session  Frequency  Min 2X/week        Progress Toward Goals  OT Goals(current goals can now be found in the care plan section)  Progress towards OT goals: Progressing toward goals  Acute Rehab OT Goals Patient Stated Goal: I want to be back home by my son's birthday OT Goal Formulation: With patient Time For Goal Achievement: 05/16/21 Potential to Achieve Goals: Good ADL Goals Pt Will Perform Grooming: with supervision;standing Pt Will Perform Upper Body Dressing: with set-up;sitting Pt Will Perform Lower Body Dressing: with min guard assist;sit  to/from stand Pt Will Transfer to Toilet: with supervision;ambulating;regular height toilet  Plan Discharge plan needs to be updated    Co-evaluation                 AM-PAC OT "6 Clicks" Daily Activity     Outcome Measure   Help from another person eating meals?: A Little Help from another person taking care of personal grooming?: A Little Help from another person toileting, which includes using toliet, bedpan, or urinal?: A Little Help from another person bathing (including washing, rinsing, drying)?: A Little Help from another person to put on and taking off regular upper body clothing?: A Little Help from another person to put on and taking off regular lower body clothing?: A Little 6 Click Score: 18    End of Session Equipment Utilized During Treatment: Gait belt;Rolling walker  OT Visit Diagnosis: Unsteadiness on feet (R26.81);Other abnormalities of gait and mobility (R26.89);Muscle weakness (generalized) (M62.81);History of falling (Z91.81);Other symptoms and signs involving cognitive function;Pain Pain - Right/Left: Right Pain - part of body: Leg   Activity Tolerance Patient tolerated treatment well   Patient Left in bed;with call bell/phone within reach;with bed alarm set   Nurse Communication Mobility status        Time: 8242-3536 OT Time Calculation (min): 29 min  Charges: OT General Charges $OT Visit: 1 Visit OT Evaluation $OT Eval Moderate Complexity: 1 Mod OT Treatments $Self Care/Home Management : 23-37 mins  05/02/2021  RP, OTR/L  Acute Rehabilitation Services  Office:  [phone]    Suzanna Obey 05/02/2021, 11:45 AM

## 2021-05-02 NOTE — Progress Notes (Signed)
HD#14 SUBJECTIVE:  Patient Summary: Marcus [name] is a 70 year old person multiple arterial clots history including 3 myocardial infarctions, stroke in 2017, an PAD who is admitted for acute strokes of R pons and L occipital lobes and is pending SNF placement.   Overnight Events: No acute events overnight.  Interim History: This is hospital day 14 for Marcus [name] who was seen and evaluated at the bedside this morning. He is still reporting numbness and tingling, mostly in his lower extremity at this point. He also feels like he is constipated, and notes that his last bowel movement was yesterday. Pain in his left great toe remains stable and he reports no other issues at this time. Still pending SNF placement at this point, airborne precautions will be lifted tomorrow.    OBJECTIVE:  Vital Signs: Vitals:   05/01/21 1951 05/01/21 2045 05/02/21 0056 05/02/21 0413  BP: (!) 151/65 (!) 145/73 (!) 106/47 136/74  Pulse: 79 90 (!) 106 75  Resp: 19 15 18 18   Temp: 97.9 F (36.6 C) 98.2 F (36.8 C) 98.1 F (36.7 C) 97.8 F (36.6 C)  TempSrc: Oral Oral  Oral  SpO2: 97% 96% 98% 98%  Weight:      Height:       Supplemental O2: Room Air SpO2: 98 %  Filed Weights   04/18/21 1139 04/26/21 0038  Weight: 81.6 kg 91.3 kg     Intake/Output Summary (Last 24 hours) at 05/02/2021 0640 Last data filed at 05/01/2021 1315 Gross per 24 hour  Intake 177 ml  Output 600 ml  Net -423 ml   Net IO Since Admission: -2,460 mL [05/02/21 0640]  Physical Exam: General: pleasant appearing elderly male, laying in bed, NAD. CV: normal rate and regular rhythm, no m/r/g. Pulm: normal work of breathing on RA, no respiratory distress noted. Skin: warm and dry. Neuro: AAOx4, strength intact bilaterally but having paresthesias with movement of left arm and left leg. Psych: calm and cooperative, but frustrated by length of hospital stay.    ASSESSMENT/PLAN:  Assessment: Principal Problem:   Acute  cerebral infarction [facility]) Active Problems:   Hypertension   HLD (hyperlipidemia)   CKD (chronic kidney disease) stage 3, GFR 30-59 ml/min (HCC)   Acute CVA (cerebrovascular accident) (HCC)   Moderate episode of recurrent major depressive disorder (HCC)   Plan: #Acute infarcts of R pons and L Occipital lobe Patient will continue on DAPT with 75mg  Plavix and 81mg  ASA for 3 weeks (until 9/1), followed by 81mg  ASA indefinitely as tolerated. Also will continue with 80mg  Lipitor daily. Recommended by neuro to have a 30 day cardiac event monitor on discharge to rule out Afib. Airborne precautions from COVID will be lifted tomorrow and we continue to work with TOC to find SNF placement for the patient. - Continue to work with PT/OT - SNF placement pending   #Hypertension Restarted home lisinopril 40mg . Home dose of metoprolol discontinued on 8/21 due to bradycardia in the 50s. HR remains stable in the 70s, and BP is at goal.  - Continue lisinopril   #Covid-19 Diagnosis confirmed with PCR on 04/23/21. Vitals stable and afebrile. Clinically appearing well with no symptoms. Airborne precautions will be lifted on 8/25.    #Depression Patient with history of depression which worsened after this most recent stroke. Describes "not wanting to be a burden," to his son and family who he has been living with. No active plan for suicide and was evaluated by Psych. Patient is working  with TOC to secure housing on discharge. - Continue Celexa 20mg  and Trazodone 100mg  prn nightly.   #Gout in left great toe Patient reports improvement with colchicine and his pain is well controlled with current regimen.  - Colchicine 0.6mg  BID, last dose tomorrow - Continue current pain management with Norco 10-325 q 6hrs prn   Best Practice: Diet: Regular diet IVF: Fluids: none VTE: rivaroxaban (XARELTO) tablet 10 mg Start: 04/20/21 1445 Code: DNR Therapy Recs: SNF vs CIR, DME: walker DISPO: Anticipated discharge in 1-2  days to Skilled nursing facility pending  COVID airborne precautions .  Signature: , D.O.  Internal Medicine Resident, PGY-1 06/20/21 Internal Medicine Residency  Pager: [phone] 6:40 AM, 05/02/2021   Please contact the on call pager after 5 pm and on weekends at [phone].

## 2021-05-02 NOTE — Progress Notes (Signed)
Physical Therapy Treatment Patient Details Name: Marcus [name] MRN: [id number] DOB: Jan 08, 1951 Today's Date: 05/02/2021    History of Present Illness 70 yo male presenting to ED on 8/10 with L sided weakness and ataxic gait. MRI showing acute R pons and L occipital infarcts. PMH including GERD, depression, CHF, HLD, HTN, CKD III, severe PAD s/p Fem/Pop bypass 2021, CAD s/p MI in 3/22 and CABG, PCI to right coronary artery 3/22, ankylosing spondylitis of spine, and subacute left pontine infarct in 2017.    PT Comments    Pt progressing well but continues to require assist for safe ambulation and ADLs. Pt limited today by sharp cramping pain in L calf, RN notified who is to notify MD. Pt does need 24/7 assist upon d/c at this time. Acute PT to cont to follow.    Follow Up Recommendations  CIR;Supervision/Assistance - 24 hour (CIR vs SNF, looking more like SNF due to decreased support at home)     Equipment Recommendations  Rolling walker with 5" wheels    Recommendations for Other Services Rehab consult     Precautions / Restrictions Precautions Precautions: Fall Precaution Comments: Airborne/Contact Restrictions Weight Bearing Restrictions: No    Mobility  Bed Mobility Overal bed mobility: Needs Assistance Bed Mobility: Supine to Sit     Supine to sit: Min guard     General bed mobility comments: labored effort, HOB at 30 deg, increased time    Transfers Overall transfer level: Needs assistance Equipment used: Rolling walker (2 wheeled) Transfers: Sit to/from Stand Sit to Stand: Min guard         General transfer comment: verbal cues for safe hand placement, min guard to steady during transition of hands due to posterior bias  Ambulation/Gait Ambulation/Gait assistance: Min assist Gait Distance (Feet): 60 Feet (x1, 40'x1) Assistive device: 1 person hand held assist;Rolling walker (2 wheeled) Gait Pattern/deviations: Decreased step length - left;Decreased  stance time - left;Decreased stride length;Trunk flexed;Step-to pattern;Ataxic;Decreased weight shift to left;Staggering left Gait velocity: decreased Gait velocity interpretation: <1.31 ft/sec, indicative of household ambulator General Gait Details: first trialed RW,  minA for safe walker management, pt becomes off balance when turning to quickly....Marland Kitchentrialed R HHA, pt with decresaed step height and length and incresaed instability requiring min/modA to maintain balance,  pt with LOB during turns multiple times. pt then with onset of sharp shooting pain in L calf requiring pt to sit in chair, RN alerted   Stairs             Wheelchair Mobility    Modified Rankin (Stroke Patients Only) Modified Rankin (Stroke Patients Only) Pre-Morbid Rankin Score: Slight disability Modified Rankin: Moderately severe disability     Balance Overall balance assessment: Needs assistance Sitting-balance support: Feet unsupported;No upper extremity supported Sitting balance-Leahy Scale: Fair Sitting balance - Comments: no balance loss at EOB to don second gown, have RN remove telemetry box/wires with intermittent periods of no UE support   Standing balance support: Bilateral upper extremity supported;During functional activity Standing balance-Leahy Scale: Poor Standing balance comment: standing with bil UE support- alternating slow marching in place with staggering stepping strategy for balance recovery. cues to slow down and faclitation provided for weight shifting with improved techniqe/balance noted. mini squats x 6 reps. Attempted to have pt perform heel raises with pt unable to return demo in standing with support and cues provided.  Cognition Arousal/Alertness: Awake/alert Behavior During Therapy: WFL for tasks assessed/performed Overall Cognitive Status: Impaired/Different from baseline Area of Impairment: Attention;Safety/judgement                    Current Attention Level: Alternating   Following Commands: Follows multi-step commands inconsistently;Follows one step commands consistently Safety/Judgement: Decreased awareness of safety;Decreased awareness of deficits Awareness: Emergent Problem Solving: Slow processing;Requires verbal cues General Comments: verbal cues to maintain safety ie. pt attempting to stand on R foot and pull up left sock loosing balance requiring minA to regain      Exercises General Exercises - Lower Extremity Mini-Sqauts:  (attempted however pt unable to tolerate due to L calf pain) Other Exercises Other Exercises: pt worked on trying to sign his name with his L hand as he is L handed which was affected by stroke. Pt has to sign his housing agreement    General Comments General comments (skin integrity, edema, etc.): VSS on RA      Pertinent Vitals/Pain Pain Assessment: Faces Faces Pain Scale: Hurts whole lot Pain Location: L calf Pain Descriptors / Indicators: Stabbing Pain Intervention(s):  (told RN who is to contact MD)    Home Living                      Prior Function            PT Goals (current goals can now be found in the care plan section) Acute Rehab PT Goals Patient Stated Goal: I want to be back home by my son's birthday PT Goal Formulation: With patient Time For Goal Achievement: 05/16/21 Potential to Achieve Goals: Good Progress towards PT goals: Progressing toward goals    Frequency    Min 3X/week      PT Plan Current plan remains appropriate    Co-evaluation              AM-PAC PT "6 Clicks" Mobility   Outcome Measure  Help needed turning from your back to your side while in a flat bed without using bedrails?: A Little Help needed moving from lying on your back to sitting on the side of a flat bed without using bedrails?: A Little Help needed moving to and from a bed to a chair (including a wheelchair)?: A Little Help needed standing up from  a chair using your arms (e.g., wheelchair or bedside chair)?: A Little Help needed to walk in hospital room?: A Lot Help needed climbing 3-5 steps with a railing? : Total 6 Click Score: 15    End of Session Equipment Utilized During Treatment: Gait belt Activity Tolerance: Patient limited by pain (in L calf) Patient left: in chair;with call bell/phone within reach;with chair alarm set Nurse Communication: Mobility status PT Visit Diagnosis: Unsteadiness on feet (R26.81);Muscle weakness (generalized) (M62.81);Other abnormalities of gait and mobility (R26.89);Difficulty in walking, not elsewhere classified (R26.2);Ataxic gait (R26.0);Other symptoms and signs involving the nervous system (R29.898)     Time: 1856-3149 PT Time Calculation (min) (ACUTE ONLY): 24 min  Charges:  $Gait Training: 8-22 mins $Therapeutic Activity: 8-22 mins                     Lewis Shock, PT, DPT Acute Rehabilitation Services Pager #: [phone] Office #: [phone]    Iona Hansen 05/02/2021, 2:19 PM

## 2021-05-02 NOTE — Plan of Care (Signed)
  Problem: Education: Goal: Knowledge of General Education information will improve Description: Including pain rating scale, medication(s)/side effects and non-pharmacologic comfort measures Outcome: Progressing   Problem: Health Behavior/Discharge Planning: Goal: Ability to manage health-related needs will improve Outcome: Progressing   Problem: Clinical Measurements: Goal: Ability to maintain clinical measurements within normal limits will improve Outcome: Progressing Goal: Will remain free from infection Outcome: Progressing Goal: Diagnostic test results will improve Outcome: Progressing Goal: Respiratory complications will improve Outcome: Progressing Goal: Cardiovascular complication will be avoided Outcome: Progressing   Problem: Activity: Goal: Risk for activity intolerance will decrease Outcome: Progressing   Problem: Nutrition: Goal: Adequate nutrition will be maintained Outcome: Progressing   Problem: Coping: Goal: Level of anxiety will decrease Outcome: Progressing   Problem: Elimination: Goal: Will not experience complications related to bowel motility Outcome: Progressing Goal: Will not experience complications related to urinary retention Outcome: Progressing   Problem: Pain Managment: Goal: General experience of comfort will improve Outcome: Progressing   Problem: Safety: Goal: Ability to remain free from injury will improve Outcome: Progressing   Problem: Skin Integrity: Goal: Risk for impaired skin integrity will decrease Outcome: Progressing   Problem: Education: Goal: Knowledge of secondary prevention will improve Outcome: Progressing Goal: Knowledge of patient specific risk factors addressed and post discharge goals established will improve Outcome: Progressing   Problem: Self-Care: Goal: Ability to participate in self-care as condition permits will improve Outcome: Progressing   Problem: Education: Goal: Knowledge of risk factors and  measures for prevention of condition will improve Outcome: Progressing   Problem: Coping: Goal: Psychosocial and spiritual needs will be supported Outcome: Progressing   Problem: Respiratory: Goal: Will maintain a patent airway Outcome: Progressing Goal: Complications related to the disease process, condition or treatment will be avoided or minimized Outcome: Progressing   

## 2021-05-03 ENCOUNTER — Other Ambulatory Visit: Payer: Self-pay | Admitting: Cardiology

## 2021-05-03 DIAGNOSIS — I639 Cerebral infarction, unspecified: Secondary | ICD-10-CM | POA: Diagnosis not present

## 2021-05-03 MED ORDER — PREDNISONE 5 MG PO TABS: 5.0000 mg | ORAL_TABLET | Freq: Every day | ORAL | 0 refills | Status: DC

## 2021-05-03 MED ORDER — DICLOFENAC SODIUM 1 % EX GEL: 2.0000 g | Freq: Four times a day (QID) | CUTANEOUS | 0 refills | Status: DC | PRN

## 2021-05-03 MED ORDER — TRAZODONE HCL 100 MG PO TABS: 100.0000 mg | ORAL_TABLET | Freq: Every evening | ORAL | 0 refills | Status: DC | PRN

## 2021-05-03 MED ORDER — PREDNISONE 5 MG PO TABS
5.0000 mg | ORAL_TABLET | Freq: Every day | ORAL | Status: DC
  Administered 2021-05-04: 5 mg via ORAL
  Filled 2021-05-03: qty 1

## 2021-05-03 MED ORDER — CLOPIDOGREL BISULFATE 75 MG PO TABS: 75.0000 mg | ORAL_TABLET | Freq: Every day | ORAL | 0 refills | Status: AC

## 2021-05-03 MED ORDER — ALLOPURINOL 100 MG PO TABS: 50.0000 mg | ORAL_TABLET | Freq: Every day | ORAL | 0 refills | Status: DC

## 2021-05-03 MED ORDER — ALLOPURINOL 100 MG PO TABS
50.0000 mg | ORAL_TABLET | Freq: Every day | ORAL | Status: DC
  Administered 2021-05-04: 50 mg via ORAL
  Filled 2021-05-03: qty 1

## 2021-05-03 MED ORDER — CITALOPRAM HYDROBROMIDE 20 MG PO TABS: 20.0000 mg | ORAL_TABLET | Freq: Every day | ORAL | 0 refills | Status: DC

## 2021-05-03 MED ORDER — HYDROCODONE-ACETAMINOPHEN 10-325 MG PO TABS: 1.0000 | ORAL_TABLET | Freq: Four times a day (QID) | ORAL | 0 refills | Status: AC | PRN

## 2021-05-03 NOTE — Progress Notes (Signed)
HD#14 SUBJECTIVE:  Patient Summary: Marcus [name] is a 70 year old person multiple arterial clots history including 3 myocardial infarctions, stroke in 2017, an PAD who is admitted for acute strokes of R pons and L occipital lobes and is pending SNF placement.   Overnight Events: No acute events overnight.  Interim History: This is hospital day 14 for Marcus [name] who was seen and evaluated at the bedside this morning. He states he feels ready to leave the hospital. His L toe pain is improved but he is still having L heel pain that improves with pain medication. He denies cough, SOB, abdominal pain. Having BMs per chart review. He is continuing to work on strength. Plan for discharge to low intensity rehab facility today.  SNF will no longer accept patient given cocaine abuse hx and positive for cocaine on admission UDS.  OBJECTIVE:  Vital Signs: Vitals:   05/03/21 0027 05/03/21 0438 05/03/21 0838 05/03/21 1137  BP: 123/63 (!) 144/76 126/63 127/63  Pulse: 66 75 74 66  Resp: 19 18 16 16   Temp: 98.6 F (37 C) 97.8 F (36.6 C) 98.2 F (36.8 C) 98 F (36.7 C)  TempSrc: Oral  Oral Oral  SpO2: 97% 97% 98% 97%  Weight:      Height:       Supplemental O2: Room Air SpO2: 97 %  Filed Weights   04/18/21 1139 04/26/21 0038  Weight: 81.6 kg 91.3 kg     Intake/Output Summary (Last 24 hours) at 05/03/2021 1450 Last data filed at 05/03/2021 1045 Gross per 24 hour  Intake 240 ml  Output 1300 ml  Net -1060 ml   Net IO Since Admission: -3,580 mL [05/03/21 1450]  Physical Exam: General: pleasant appearing elderly male, laying in bed, NAD. CV: normal rate and regular rhythm, no m/r/g. Pulm: normal work of breathing on RA, no respiratory distress noted. Skin: warm and dry. Neuro: AAOx4, strength intact bilaterally but having paresthesias with movement of left arm and left leg. Psych: calm and cooperative, but frustrated by length of hospital stay.    ASSESSMENT/PLAN:   Assessment: Principal Problem:   Acute cerebral infarction [facility]) Active Problems:   Hypertension   HLD (hyperlipidemia)   CKD (chronic kidney disease) stage 3, GFR 30-59 ml/min (HCC)   Acute CVA (cerebrovascular accident) (HCC)   Moderate episode of recurrent major depressive disorder (HCC)   Plan: #Acute infarcts of R pons and L Occipital lobe Patient will continue on DAPT with 75mg  Plavix and 81mg  ASA for 3 weeks (until 9/1), followed by 81mg  ASA indefinitely as tolerated. Also will continue with 80mg  Lipitor daily. Recommended by neuro to have a 30 day cardiac event monitor on discharge to rule out Afib. [facility] denied placement given cocaine abuse. Continue to work with [facility] to find SNF placement for the patient. - Continue to work with PT/OT - SNF placement pending -Follow up with Neurology, Cardiology (30d cardiac event monitor will be sent to SNF: will need nursing order at discharge for monitor placement at SNF)   #Hypertension Restarted home lisinopril 40mg . Home dose of metoprolol discontinued on 8/21 due to bradycardia in the 50s. HR remains stable in the 70s, and BP is at goal.  - Continue lisinopril   #Covid-19 Diagnosis confirmed with PCR on 04/23/21. Vitals stable and afebrile. Clinically appearing well with no symptoms. Airborne precautions discontinued.     #Depression Patient with history of depression which worsened after this most recent stroke. Describes "not wanting to be a  burden," to his son and family who he has been living with. No active plan for suicide and was evaluated by Psych. Will need to follow up with PCP after SNF stay. - Continue Celexa 20mg  and Trazodone 100mg  prn nightly.   #Gout in left great toe Patient reports improvement with colchicine and his pain is well controlled with current regimen.  - Colchicine completed - Continue current pain management with Norco 10-325 q 6hrs prn - Allopurinol 50mg  daily, prednisone 5mg  for 5 days to prevent  future gout flare   #CAD s/p 3 vessel CABG 2 stents placed #Hx STEMI #CHF #HLD Patient has extensive cardiac history. EKG on arrival NSR with LVH. Patient was continued on the following home medications: Aspirin, Lipitor. Patient euvolemic. No Lasix given this admission. No further medical management was required.    #Cocaine Use Disorder History of cocaine use disorder. UDS positive for cocaine. CSW  suggested healthier alternative coping mechanism and encouraged him to utilize resources provided.    #GERD Patient has history of GERD. Patient was continued on the following home medications: Protonix. No further medical management was required. Protonix will interact less with Plavix than omeprazole, will continue Protonix at discharge.  Best Practice: Diet: Regular diet IVF: Fluids: none VTE: rivaroxaban (XARELTO) tablet 10 mg  Code: DNR Therapy Recs: SNF, DME: walker DISPO: Anticipated discharge in 1-2 days to SNF  Signature: , MD 05/03/21, 2:56 PM Pager: [phone] Internal Medicine Resident, PGY-1 Internal Medicine    Please contact the on call pager after 5 pm and on weekends at [phone].

## 2021-05-03 NOTE — Plan of Care (Signed)
  Problem: Education: Goal: Knowledge of General Education information will improve Description: Including pain rating scale, medication(s)/side effects and non-pharmacologic comfort measures Outcome: Progressing   Problem: Health Behavior/Discharge Planning: Goal: Ability to manage health-related needs will improve Outcome: Progressing   Problem: Clinical Measurements: Goal: Ability to maintain clinical measurements within normal limits will improve Outcome: Progressing Goal: Will remain free from infection Outcome: Progressing Goal: Diagnostic test results will improve Outcome: Progressing Goal: Respiratory complications will improve Outcome: Progressing Goal: Cardiovascular complication will be avoided Outcome: Progressing   Problem: Activity: Goal: Risk for activity intolerance will decrease Outcome: Progressing   Problem: Nutrition: Goal: Adequate nutrition will be maintained Outcome: Progressing   Problem: Coping: Goal: Level of anxiety will decrease Outcome: Progressing   Problem: Elimination: Goal: Will not experience complications related to bowel motility Outcome: Progressing Goal: Will not experience complications related to urinary retention Outcome: Progressing   Problem: Pain Managment: Goal: General experience of comfort will improve Outcome: Progressing   Problem: Safety: Goal: Ability to remain free from injury will improve Outcome: Progressing   Problem: Skin Integrity: Goal: Risk for impaired skin integrity will decrease Outcome: Progressing   Problem: Education: Goal: Knowledge of secondary prevention will improve Outcome: Progressing Goal: Knowledge of patient specific risk factors addressed and post discharge goals established will improve Outcome: Progressing   Problem: Self-Care: Goal: Ability to participate in self-care as condition permits will improve Outcome: Progressing   Problem: Education: Goal: Knowledge of risk factors and  measures for prevention of condition will improve Outcome: Progressing   Problem: Coping: Goal: Psychosocial and spiritual needs will be supported Outcome: Progressing   Problem: Respiratory: Goal: Will maintain a patent airway Outcome: Progressing Goal: Complications related to the disease process, condition or treatment will be avoided or minimized Outcome: Progressing   

## 2021-05-03 NOTE — Discharge Summary (Signed)
Name: ZYMEIR SALMINEN MRN: [id number] DOB: [birth year] 70 y.o. 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 lobe Depression with suicidal thoughts Gout in left great toe Left heel pain 2/2 stage 1 pressure injury vs gout flare of heel B12 deficiency Covid-19 Cocaine Use Disorder  Discharge Medications: Allergies as of 05/03/2021       Reactions   Other Other (See Comments)   Tomato- hives   Penicillins Hives, Other (See Comments)   Has patient had a PCN reaction causing immediate rash, facial/tongue/throat swelling, SOB or lightheadedness with hypotension: No Has patient had a PCN reaction causing severe rash involving mucus membranes or skin necrosis: No Has patient had a PCN reaction that required hospitalization No Has patient had a PCN reaction occurring within the last 10 years: No If all of the above answers are "NO", then may proceed with Cephalosporin use.        Medication List     STOP taking these medications    metoprolol tartrate 50 MG tablet Commonly known as: LOPRESSOR   omeprazole 20 MG capsule Commonly known as: PRILOSEC       TAKE these medications    allopurinol 100 MG tablet Commonly known as: Zyloprim Take 0.5 tablets (50 mg total) by mouth daily.   aspirin 81 MG EC tablet Take 1 tablet (81 mg total) by mouth daily at 6 (six) AM.   atorvastatin 80 MG tablet Commonly known as: LIPITOR Take 80 mg by mouth daily.   cholecalciferol 25 MCG (1000 UNIT) tablet Commonly known as: VITAMIN D3 Take 1,000 Units by mouth daily. What changed: Another medication with the same name was removed. Continue taking this medication, and follow the directions you see here.   citalopram 20 MG tablet Commonly known as: CELEXA Take 1 tablet (20 mg total) by mouth daily.   clopidogrel 75 MG  tablet Commonly known as: PLAVIX Take 1 tablet (75 mg total) by mouth daily with breakfast for 8 days.   diclofenac Sodium 1 % Gel Commonly known as: Voltaren Apply 2 g topically 4 (four) times daily as needed (As needed for L heel pain).   furosemide 40 MG tablet Commonly known as: LASIX Take 40 mg by mouth daily.   gabapentin 300 MG capsule Commonly known as: NEURONTIN Take 300 mg by mouth 3 (three) times daily.   HYDROcodone-acetaminophen 10-325 MG tablet Commonly known as: NORCO Take 1 tablet by mouth every 6 (six) hours as needed for moderate pain.   isosorbide mononitrate 30 MG 24 hr tablet Commonly known as: IMDUR Take 0.5 tablets (15 mg total) by mouth daily.   lisinopril 40 MG tablet Commonly known as: ZESTRIL Take 40 mg by mouth daily.   nitroGLYCERIN 0.4 MG/SPRAY spray Commonly known as: NITROLINGUAL Place 1 spray under the tongue every 5 (five) minutes as needed for chest pain.   pantoprazole 40 MG tablet Commonly known as: PROTONIX Take 1 tablet (40 mg total) by mouth daily.   predniSONE 5 MG tablet Commonly known as: DELTASONE Take 1 tablet (5 mg total) by mouth daily with breakfast for 5 days.   traZODone 100 MG tablet Commonly known as: DESYREL Take 1 tablet (100 mg total) by mouth at bedtime as needed for sleep.               Durable Medical Equipment  (From  admission, onward)           Start     Ordered   05/03/21 1229  DME Walker  Once       Question Answer Comment  Walker: With 5 Inch Wheels   Patient needs a walker to treat with the following condition Stroke [facility](HCC)      05/03/21 1228            Disposition and follow-up:   Mr.Roshaun J Su HiltRoberts [facility] St[facility].  At the hospital follow up visit please address:  1. Acute cryptogenic ischemic stroke of R pons and L occipital lobe: Patient started on DAPT for cryptogenic stroke. Plavix end date is 9/1. Etiology ischemic vs  cardio embolic, 30d event monitor will ship to SNF to evaluate for afib. Patient will need to follow up with Neurology and Cardiology. 2. Depression with suicidal thoughts: SNF will help arrange follow up with PCP after discharge from SNF 3. 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 Start with dose 100mg  daily, will need dose titration by 100 mg every two to four weeks to reach and maintain the urate-lowering goal range <6 mg/dL  4. Left heel pain 2/2 stage 1 pressure injury vs gout flare of heel: Patient will continue on home dose NORCO. Steroids may help with symptoms is related to gout flare of heel.  5. B12 deficiency: B12 on arrival was mildly low 176, was given single dose IM B12 1000u. Will need to follow up B12.  2.  Labs / imaging needed at time of follow-up: B12, serum uric acid  3.  Pending labs/ test needing follow-up: none  Follow-up Appointments:  Follow-up Information     [facility]. Schedule an appointment as soon as possible for a visit in 1 month(s).   Specialty: Neurology Why: stroke clinic Contact information: [address] [place] [id number] [phone]        Orpah CobbKadakia, Ajay, MD. Call in 1 month(s).   Specialty: Cardiology Why: Call your cardiologist for a follow up appointment in 1 month Contact information: [address] [place] [address] [phone]                 Hospital Course by problem list:  Raliegh ScarletJames J Cardinal is a [age] year old african [place] male with PMHx of multiple arterial clots, including 3 myocardial infarctions, stroke in 2017, and PAD who is admitted for two small acute cerebral infarctions at the right pons and left occipital lobe.  #Acute ischemic strokes of R pons and L occipital lobe #Hx prior stroke 2017 L pons Presented with acute left sided weakness and numbness which evolved into warm/tingling  paresthesias. 04/18/21 MRI revealed small acute infarcts of R pons and L occipital lobe in addition to chronic small vessel infarcts. Patient was out of the window for tPA. 04/18/21 CTA Head and Neck showing 60% stenosis of R carotid bulb and focal high grade stenosis of proximal L inferior M2, also R PCA with mild stenosis. Pt with 45+ pack year history and not regularly adherent to risk reducing medications including Plavix, ASA, and Lipitor. LDL at 140 and A1C at 6.2. Permissive HTN allowed for first 48 hours and home 81mg  ASA, 75mg  Plavix, and 80mg  Lipitor were all restarted. Echo on 8/11 showed EF of 55-60% with no intracardiac source of embolism detected. LE duplex US without  evidence of DVT. Neurology recommended DAPT,  Plavix and  ASA for 3 weeks, followed by  ASA. Plavix therapy will be completed on 9/1. Etiology of stroke is cryptogenic at this time. To further evaluate for embolic stroke given multifocal ischemia, patient is recommended to have a 30 day cardiac event monitor to rule out Afib which will be mailed to SNF. Patient stable for discharge to SNF on 8/25. Patient will follow up with Neurology and Cardiology.  #Depression with suicidal thoughts Patient with history of depression, and acutely worsened after this most recent stroke. Describes "not wanting to be a burden," to his son and family who he has been living with. No active plan for suicide. Was evaluated by Psychiatry who increased his  Celexa and started him on  Trazodone nightly. Will be discharged with these medications. Patient will need outpatient follow up with PCP.  #Acute gout flare in left great toe Patient has had gout flares in the past and feels that his symptoms are similar to what he has experienced. Reports improvement with colchicine and his pain is well controlled with current regimen. Colchicine therapy finished on the day of discharge. Patient will start on Allopurinol for prevention of future  gout flares. We will start this medication at discharge with 5 day course of steroids to prevent acute gout flare upon starting allopurinol. Start with dose  daily, will need dose titration by 100 mg every two to four weeks to reach and maintain the urate-lowering goal range <6 mg/dL   #Hypertension Patient has history of hypertension. Restarted on home lisinopril  after stroke. Was previously also on metoprolol but this was discontinued on 8/21 due to bradycardia in the 50s. Patient blood pressures have been stable this admission. Will be discharged to SNF on home blood pressure medication.   #Left heel pain 2/2 stage 1 pressure injury vs acute gout of heel Patient started to have left heel pain on 8/13, thought to be secondary to stage 1 pressure wound vs acute gout flare of heel. 8/15 XR of L foot/ankle was negative for any bony or soft tissue abnormality. Pain improved with medical management and with propping foot on soft pillow to prevent pressure injury. Patient will continue home dose NORCO and gabapentin for foot pain. If pain is related to acute gout flare then steroids will improve symptoms.   #B12 deficiency #hx of vitamin D deficiency 04/18/21 B12 low at 176 and patient was given B12 IM on 8/11 while admitted. Patient was continued on home vitamin D supplement. No further medical management required.   #Peripheral Neuropathy Patient has history of peripheral neuropathy. Patient was continued on the following home medications: gabapentin 300 TID. No further medical management was required.   #Covid-19 Incidental Finding Diagnosis confirmed with PCR on 04/23/21. Vitals stable and afebrile. Clinically appearing well with no symptoms. Airborne precautions will be lifted on 8/25. Patient without COVID19 symptoms at discharge  #CAD s/p 3 vessel CABG 2 stents placed #Hx STEMI #CHF #HLD Patient has extensive cardiac history. EKG on arrival NSR with LVH. Patient was continued  on the following home medications: Aspirin, Lipitor. Patient euvolemic. No Lasix given this admission. No further medical management was required.   #Cocaine Use Disorder History of cocaine use disorder. UDS positive for cocaine. CSW  suggested healthier alternative coping mechanism and encouraged him to utilize resources provided.   #GERD Patient has history of GERD. Patient was continued on the following home medications: Protonix. No further medical management was required.  Subjective on Day of Discharge: Patient seen and examined on rounds this morning. He states he feels ready to leave the hospital. His L toe pain is improved but he is still having L heel pain that improves with pain medication. He denies cough SOB, abdominal pain. Having BMs per chart review. He is continuing to work on strength. Plan for discharge to low intensity rehab facility today.   Discharge Exam:   BP 127/63 (BP Location: Left Arm)   Pulse 66   Temp 98 F (36.7 C) (Oral)   Resp 16   Ht 5\' 7"  (1.702 m)   Wt 91.3 kg   SpO2 97%   BMI 31.52 kg/m  Physical Exam: General: Well appearing african male, NAD HENT: normocephalic, atraumatic EYES: conjunctiva non-erythematous, no scleral icterus CV: regular rate, normal rhythm, no murmurs, rubs, gallops. Pulmonary: sating well on RA, lung clear to auscultation, no rales, wheezes, rhonchi Abdominal: non-distended, soft, non-tender to palpation, normal BS Skin: Warm and dry, no rashes or lesions Neurological: MS: awake, alert and oriented x3, baseline speech and fund of knowledge Motor: moves all extremities antigravity Psych: normal affect   Pertinent Labs, Studies, and Procedures:  CBC Latest Ref Rng & Units 04/27/2021 04/18/2021 05/27/2020  WBC 4.0 - 10.5 K/uL 5.5 5.2 6.6  Hemoglobin 13.0 - 17.0 g/dL 05/29/2020 45.4 11.7(L)  Hematocrit 39.0 - 52.0 % 41.5 45.8 38.9(L)  Platelets 150 - 400 K/uL 190 157 126(L)   BMP Latest Ref Rng & Units 04/26/2021  04/25/2021 04/24/2021  Glucose 70 - 99 mg/dL 04/26/2021) 119(J) 478(G)  BUN 8 - 23 mg/dL 16 16 15   Creatinine 0.61 - 1.24 mg/dL 956(O) ) 1.30(Q)  Sodium 135 - 145 mmol/L 138 137 136  Potassium 3.5 - 5.1 mmol/L 3.9 4.0 4.0  Chloride 98 - 111 mmol/L 101 103 103  CO2 22 - 32 mmol/L 27 27 27   Calcium 8.9 - 10.3 mg/dL 9.4 9.0 6.57(Q)   Troponin I (High Sensitivity) <18 ng/L 9    Troponin I (High Sensitivity) <18 ng/L 11    Hgb A1c MFr Bld 4.8 - 5.6 % 6.2 High    HIV Screen 4th Generation wRfx Non Reactive Non Reactive    Component Ref Range & Units 2 wk ago  (04/19/21) 11 mo ago  (05/26/20) 1 yr ago  (12/22/19) 1 yr ago  (12/08/19) 5 yr ago  (09/22/15) 6 yr ago  (07/07/14) 8 yr ago  (02/13/13)  Cholesterol 0 - 200 mg/dL 09/24/15 High    07/09/14  04/15/13  284  184  187   Triglycerides <150 mg/dL 132 High   440 CM  102  115  155 High   149  70   HDL >40 mg/dL 38 Low    47  45  38 Low   52 R  63 R   Total CHOL/HDL Ratio RATIO 5.9   3.3  3.2  4.6  3.5  3.0   VLDL 0 - 40 mg/dL 47 High    28  23  31  30  14    LDL Cholesterol 0 - 99 mg/dL 725 High    82        TSH 0.350 - 4.500 uIU/mL 2.545    Vitamin B-12 180 - 914 pg/mL 176 Low    Opiates NONE DETECTED NONE DETECTED  NONE DETECTED  NONE DETECTED   Cocaine NONE DETECTED POSITIVE Abnormal   NONE DETECTED  POSITIVE Abnormal    Benzodiazepines NONE DETECTED NONE DETECTED  NONE  DETECTED  NONE DETECTED   Amphetamines NONE DETECTED NONE DETECTED  NONE DETECTED  NONE DETECTED   Tetrahydrocannabinol NONE DETECTED POSITIVE Abnormal   POSITIVE Abnormal   NONE DETECTED   Barbiturates NONE DETECTED NONE DETECTED  NONE DETECTED     SARS Coronavirus 2 NEGATIVE POSITIVE Abnormal    CT Angio Head W or Wo Contrast  Result Date: 04/18/2021 CLINICAL DATA:  Left-sided numbness and weakness EXAM: CT ANGIOGRAPHY HEAD AND NECK TECHNIQUE: Multidetector CT imaging of the head and neck was performed using the standard protocol during bolus administration of intravenous  contrast. Multiplanar CT image reconstructions and MIPs were obtained to evaluate the vascular anatomy. Carotid stenosis measurements (when applicable) are obtained utilizing NASCET criteria, using the distal internal carotid diameter as the denominator. CONTRAST:  50mL OMNIPAQUE IOHEXOL 350 MG/ML SOLN COMPARISON:  Same-day brain MRI FINDINGS: CT HEAD FINDINGS Brain: There is hypodensity in the right pons corresponding to the evolving acute to early subacute infarct seen on the same day brain MRI. The small infarcts in the left occipital lobe are not well seen. Small remote infarcts in the bilateral basal ganglia, frontal lobes, and pons are also again seen. There is no evidence of new territorial infarct. There is no acute intracranial hemorrhage. The ventricles are stable in size. There is no mass lesion. There is no midline shift. Skull: Normal. Negative for fracture or focal lesion. Sinuses: The paranasal sinuses and mastoid air cells are clear. Orbits: The globes and orbits are unremarkable. Review of the MIP images confirms the above findings CTA NECK FINDINGS Aortic arch: A single focus of calcification is seen in the proximal descending thoracic aorta. The aortic arch is otherwise unremarkable. Surgical material is noted along the anterior margin of the ascending aorta. Right carotid system: There is soft and calcified atherosclerotic plaque of the proximal right internal carotid artery resulting in approximately 60% stenosis. There is no evidence of dissection. Left carotid system: There is now atherosclerotic plaque of the left carotid system. There is no significant stenosis or occlusion. There is no evidence of dissection. Vertebral arteries: There is mild stenosis of the proximal left vertebral artery just after the origin. The remainder of the vertebral arteries are patent bilaterally. There is no evidence of dissection. Skeleton: There is multilevel degenerative change of the cervical spine with bulky  anterior osteophytes from C4 through C6. There is no acute osseous abnormality or aggressive osseous lesion. Other neck: The soft tissues are unremarkable. Upper chest: The lung apices are clear. Median sternotomy wires are partially imaged. Review of the MIP images confirms the above findings CTA HEAD FINDINGS Anterior circulation: There is calcification of the bilateral cavernous ICAs resulting in mild to moderate stenosis on the right. There is no hemodynamically significant stenosis on the left. The right A1 segment is hypoplastic. The anterior cerebral arteries are otherwise patent. There is focal high-grade stenosis of the proximal left inferior M2 division (12-96, 11-101. The distal left MCA branches are patent. The right MCA and distal branches are patent. Posterior circulation: There is multifocal irregularity of the right knee PCA likely reflecting atherosclerotic disease. There is no evidence of high-grade stenosis or occlusion. Venous sinuses: As permitted by contrast timing, patent. Anatomic variants: Hypoplastic right A1. Review of the MIP images confirms the above findings IMPRESSION: 1. Calcified atherosclerotic plaque at the right carotid bulb resulting in approximately 60% stenosis. 2. Focal high-grade stenosis of the proximal left inferior M2 division. 3. Mild irregularity of the right posterior cerebral artery likely  due to atherosclerotic disease and mild to moderate stenosis of the right cavernous ICA. No other significant stenosis or occlusion. No evidence of dissection. 4. The small right pontine and left occipital infarcts are better seen on the MRI obtained earlier the same day. Electronically Signed   By: Lesia Hausen MD   On: 04/18/2021 16:27   CT Angio Neck W and/or Wo Contrast  Result Date: 04/18/2021 CLINICAL DATA:  Left-sided numbness and weakness EXAM: CT ANGIOGRAPHY HEAD AND NECK TECHNIQUE: Multidetector CT imaging of the head and neck was performed using the standard protocol  during bolus administration of intravenous contrast. Multiplanar CT image reconstructions and MIPs were obtained to evaluate the vascular anatomy. Carotid stenosis measurements (when applicable) are obtained utilizing NASCET criteria, using the distal internal carotid diameter as the denominator. CONTRAST:  50mL OMNIPAQUE IOHEXOL 350 MG/ML SOLN COMPARISON:  Same-day brain MRI FINDINGS: CT HEAD FINDINGS Brain: There is hypodensity in the right pons corresponding to the evolving acute to early subacute infarct seen on the same day brain MRI. The small infarcts in the left occipital lobe are not well seen. Small remote infarcts in the bilateral basal ganglia, frontal lobes, and pons are also again seen. There is no evidence of new territorial infarct. There is no acute intracranial hemorrhage. The ventricles are stable in size. There is no mass lesion. There is no midline shift. Skull: Normal. Negative for fracture or focal lesion. Sinuses: The paranasal sinuses and mastoid air cells are clear. Orbits: The globes and orbits are unremarkable. Review of the MIP images confirms the above findings CTA NECK FINDINGS Aortic arch: A single focus of calcification is seen in the proximal descending thoracic aorta. The aortic arch is otherwise unremarkable. Surgical material is noted along the anterior margin of the ascending aorta. Right carotid system: There is soft and calcified atherosclerotic plaque of the proximal right internal carotid artery resulting in approximately 60% stenosis. There is no evidence of dissection. Left carotid system: There is now atherosclerotic plaque of the left carotid system. There is no significant stenosis or occlusion. There is no evidence of dissection. Vertebral arteries: There is mild stenosis of the proximal left vertebral artery just after the origin. The remainder of the vertebral arteries are patent bilaterally. There is no evidence of dissection. Skeleton: There is multilevel  degenerative change of the cervical spine with bulky anterior osteophytes from C4 through C6. There is no acute osseous abnormality or aggressive osseous lesion. Other neck: The soft tissues are unremarkable. Upper chest: The lung apices are clear. Median sternotomy wires are partially imaged. Review of the MIP images confirms the above findings CTA HEAD FINDINGS Anterior circulation: There is calcification of the bilateral cavernous ICAs resulting in mild to moderate stenosis on the right. There is no hemodynamically significant stenosis on the left. The right A1 segment is hypoplastic. The anterior cerebral arteries are otherwise patent. There is focal high-grade stenosis of the proximal left inferior M2 division (12-96, 11-101. The distal left MCA branches are patent. The right MCA and distal branches are patent. Posterior circulation: There is multifocal irregularity of the right knee PCA likely reflecting atherosclerotic disease. There is no evidence of high-grade stenosis or occlusion. Venous sinuses: As permitted by contrast timing, patent. Anatomic variants: Hypoplastic right A1. Review of the MIP images confirms the above findings IMPRESSION: 1. Calcified atherosclerotic plaque at the right carotid bulb resulting in approximately 60% stenosis. 2. Focal high-grade stenosis of the proximal left inferior M2 division. 3. Mild irregularity of the  right posterior cerebral artery likely due to atherosclerotic disease and mild to moderate stenosis of the right cavernous ICA. No other significant stenosis or occlusion. No evidence of dissection. 4. The small right pontine and left occipital infarcts are better seen on the MRI obtained earlier the same day. Electronically Signed   By: Lesia Hausen MD   On: 04/18/2021 16:27   MR BRAIN WO CONTRAST  Result Date: 04/18/2021 CLINICAL DATA:  Dizziness, nonspecific; left-sided numbness EXAM: MRI HEAD WITHOUT CONTRAST TECHNIQUE: Multiplanar, multiecho pulse sequences of  the brain and surrounding structures were obtained without intravenous contrast. COMPARISON:  November 2017 FINDINGS: Brain: Foci of mildly reduced diffusion are present at the right lateral aspect of the pons and parasagittal left occipital lobe. There are multiple chronic small vessel infarcts including involvement of the corona radiata, ventral corpus callosum, basal ganglia and thalami, pons, and cerebellum. Additional patchy foci of T2 hyperintensity in the supratentorial white matter are nonspecific but may reflect minor chronic microvascular ischemic changes. Ventricles and sulci are within normal limits in size and configuration. Foci of susceptibility reflecting chronic microhemorrhage are present in the left thalamus. There is no intracranial mass or mass effect. There is no hydrocephalus or extra-axial fluid collection. Vascular: Major vessel flow voids at the skull base are preserved. Skull and upper cervical spine: Normal marrow signal is preserved. Sinuses/Orbits: Paranasal sinuses are aerated. Orbits are unremarkable. Other: Sella is unremarkable.  Mastoid air cells are clear. IMPRESSION: Small acute infarcts of the right pons and left occipital lobe. Several chronic small vessel infarcts. Left thalamic chronic microhemorrhages. Electronically Signed   By: Guadlupe Spanish M.D.   On: 04/18/2021 13:58   DG Chest Port 1 View  Result Date: 04/18/2021 CLINICAL DATA:  LEFT-sided chest pain EXAM: PORTABLE CHEST 1 VIEW COMPARISON:  12/21/2019 FINDINGS: Sternotomy wires overlie normal cardiac silhouette. Normal pulmonary vasculature. No effusion, infiltrate, or pneumothorax. No acute osseous abnormality. IMPRESSION: No acute cardiopulmonary process. Electronically Signed   By: Genevive Bi M.D.   On: 04/18/2021 13:20   ECHOCARDIOGRAM COMPLETE  Result Date: 04/19/2021    ECHOCARDIOGRAM REPORT   Patient Name:   KALON ERHARDT Date of Exam: 04/19/2021 Medical Rec #:  [id number]       Height:       67.0  in Accession #:    [id number]      Weight:       180.0 lb Date of Birth:  04-05-1951       BSA:          1.934 m Patient Age:    69 years        BP:           156/85 mmHg Patient Gender: M               HR:           60 bpm. Exam Location:  Inpatient Procedure: 2D Echo, Cardiac Doppler and Color Doppler Indications:    Stroke I63.9  History:        Patient has prior history of Echocardiogram examinations, most                 recent 12/22/2019. CHF, Previous Myocardial Infarction, Stroke;                 Risk Factors:Hypertension, Dyslipidemia and Former Smoker. PVD.  Sonographer:    Renella Cunas RDCS Referring Phys: [id number] Leda Gauze IMPRESSIONS  1. Left ventricular ejection fraction, by estimation, is  55 to 60%. The left ventricle has normal function. The left ventricle has no regional wall motion abnormalities. Left ventricular diastolic parameters are consistent with Grade I diastolic dysfunction (impaired relaxation).  2. Right ventricular systolic function is normal. The right ventricular size is normal. Tricuspid regurgitation signal is inadequate for assessing PA pressure.  3. The mitral valve is grossly normal. Trivial mitral valve regurgitation.  4. The aortic valve is tricuspid. Aortic valve regurgitation is not visualized. Comparison(s): Changes from prior study are noted. 12/22/2019: LVEF 60-65%. Conclusion(s)/Recommendation(s): No intracardiac source of embolism detected on this transthoracic study. A transesophageal echocardiogram is recommended to exclude cardiac source of embolism if clinically indicated. FINDINGS  Left Ventricle: Left ventricular ejection fraction, by estimation, is 55 to 60%. The left ventricle has normal function. The left ventricle has no regional wall motion abnormalities. The left ventricular internal cavity size was normal in size. There is  no left ventricular hypertrophy. Left ventricular diastolic parameters are consistent with Grade I diastolic dysfunction (impaired  relaxation). Indeterminate filling pressures. Right Ventricle: The right ventricular size is normal. No increase in right ventricular wall thickness. Right ventricular systolic function is normal. Tricuspid regurgitation signal is inadequate for assessing PA pressure. Left Atrium: Left atrial size was normal in size. Right Atrium: Right atrial size was normal in size. Pericardium: There is no evidence of pericardial effusion. Mitral Valve: The mitral valve is grossly normal. Trivial mitral valve regurgitation. Tricuspid Valve: The tricuspid valve is grossly normal. Tricuspid valve regurgitation is trivial. Aortic Valve: The aortic valve is tricuspid. Aortic valve regurgitation is not visualized. Pulmonic Valve: The pulmonic valve was normal in structure. Pulmonic valve regurgitation is not visualized. Aorta: The aortic root and ascending aorta are structurally normal, with no evidence of dilitation. IAS/Shunts: No atrial level shunt detected by color flow Doppler.  LEFT VENTRICLE PLAX 2D LVIDd:         5.20 cm      Diastology LVIDs:         4.00 cm      LV e' medial:    6.09 cm/s LV PW:         0.90 cm      LV E/e' medial:  13.1 LV IVS:        0.90 cm      LV e' lateral:   10.20 cm/s LVOT diam:     2.00 cm      LV E/e' lateral: 7.8 LV SV:         67 LV SV Index:   34 LVOT Area:     3.14 cm  LV Volumes (MOD) LV vol d, MOD A2C: 112.0 ml LV vol d, MOD A4C: 121.0 ml LV vol s, MOD A2C: 55.9 ml LV vol s, MOD A4C: 53.3 ml LV SV MOD A2C:     56.1 ml LV SV MOD A4C:     121.0 ml LV SV MOD BP:      63.3 ml RIGHT VENTRICLE RV S prime:     11.80 cm/s TAPSE (M-mode): 1.8 cm LEFT ATRIUM             Index       RIGHT ATRIUM           Index LA diam:        3.90 cm 2.02 cm/m  RA Area:     12.20 cm LA Vol (A2C):   30.4 ml 15.72 ml/m RA Volume:   33.20 ml  17.17 ml/m LA Vol (A4C):  17.2 ml 8.89 ml/m LA Biplane Vol: 23.2 ml 12.00 ml/m  AORTIC VALVE LVOT Vmax:   112.50 cm/s LVOT Vmean:  74.600 cm/s LVOT VTI:    0.212 m  AORTA Ao  Root diam: 3.30 cm Ao Asc diam:  3.30 cm MITRAL VALVE MV Area (PHT): 2.46 cm    SHUNTS MV Decel Time: 308 msec    Systemic VTI:  0.21 m MV E velocity: 79.80 cm/s  Systemic Diam: 2.00 cm MV A velocity: 87.10 cm/s MV E/A ratio:  0.92 Zoila Shutter MD Electronically signed by Zoila Shutter MD Signature Date/Time: 04/19/2021/10:36:08 AM    Final    VAS [place] LOWER EXTREMITY VENOUS (DVT)  Result Date: 04/19/2021  Lower Venous DVT Study Patient Name:  STEPHANIE MCGLONE  Date of Exam:   04/19/2021 Medical Rec #: [id number]        Accession #:    [id number] Date of Birth: 16-Feb-1951        Patient Gender: M Patient Age:   23 years Exam Location:  [facility] Procedure:      VAS [place] LOWER EXTREMITY VENOUS (DVT) Referring Phys: Garner Gavel --------------------------------------------------------------------------------  Indications: Stroke.  Comparison Study: No prior venous studies. Performing Technologist: Jean Rosenthal  Examination Guidelines: A complete evaluation includes B-mode imaging, spectral Doppler, color Doppler, and power Doppler as needed of all accessible portions of each vessel. Bilateral testing is considered an integral part of a complete examination. Limited examinations for reoccurring indications may be performed as noted. The reflux portion of the exam is performed with the patient in reverse Trendelenburg.  +---------+---------------+---------+-----------+----------+--------------+ RIGHT    CompressibilityPhasicitySpontaneityPropertiesThrombus Aging +---------+---------------+---------+-----------+----------+--------------+ CFV      Full           Yes      Yes                                 +---------+---------------+---------+-----------+----------+--------------+ SFJ      Full                                                        +---------+---------------+---------+-----------+----------+--------------+ FV Prox  Full                                                         +---------+---------------+---------+-----------+----------+--------------+ FV Mid   Full                                                        +---------+---------------+---------+-----------+----------+--------------+ FV DistalFull                                                        +---------+---------------+---------+-----------+----------+--------------+ PFV      Full                                                        +---------+---------------+---------+-----------+----------+--------------+  POP      Full           Yes      Yes                                 +---------+---------------+---------+-----------+----------+--------------+ PTV      Full                                                        +---------+---------------+---------+-----------+----------+--------------+ PERO     Full                                                        +---------+---------------+---------+-----------+----------+--------------+   +---------+---------------+---------+-----------+----------+--------------+ LEFT     CompressibilityPhasicitySpontaneityPropertiesThrombus Aging +---------+---------------+---------+-----------+----------+--------------+ CFV      Full           Yes      Yes                                 +---------+---------------+---------+-----------+----------+--------------+ SFJ      Full                                                        +---------+---------------+---------+-----------+----------+--------------+ FV Prox  Full                                                        +---------+---------------+---------+-----------+----------+--------------+ FV Mid   Full                                                        +---------+---------------+---------+-----------+----------+--------------+ FV DistalFull                                                         +---------+---------------+---------+-----------+----------+--------------+ PFV      Full                                                        +---------+---------------+---------+-----------+----------+--------------+ POP      Full           Yes      Yes                                 +---------+---------------+---------+-----------+----------+--------------+  PTV      Full                                                        +---------+---------------+---------+-----------+----------+--------------+ PERO     Full                                                        +---------+---------------+---------+-----------+----------+--------------+     Summary: RIGHT: - There is no evidence of deep vein thrombosis in the lower extremity.  - No cystic structure found in the popliteal fossa.  LEFT: - There is no evidence of deep vein thrombosis in the lower extremity.  - No cystic structure found in the popliteal fossa.  *See table(s) above for measurements and observations. Electronically signed by Sherald Hess MD on 04/19/2021 at 8:02:24 PM.    Final       Discharge Instructions: Discharge Instructions     Ambulatory referral to Neurology   Complete by: As directed    Follow up with stroke clinic NP (Jessica Vanschaick or Darrol Angel, if both not available, consider Manson Allan, or Ahern) at [facility] in about 4 weeks. Thanks.   Diet - low sodium heart healthy   Complete by: As directed    Diet - low sodium heart healthy   Complete by: As directed    Increase activity slowly   Complete by: As directed    Increase activity slowly   Complete by: As directed        Signed: Ellison Carwin, MD 05/03/2021, 12:29 PM   Pager: [phone]

## 2021-05-03 NOTE — Plan of Care (Signed)
  Problem: Education: Goal: Knowledge of General Education information will improve Description Including pain rating scale, medication(s)/side effects and non-pharmacologic comfort measures Outcome: Progressing   Problem: Health Behavior/Discharge Planning: Goal: Ability to manage health-related needs will improve Outcome: Progressing   

## 2021-05-03 NOTE — Progress Notes (Signed)
Nutrition Brief Note  Patient identified on the Malnutrition Screening Tool (MST) Report.  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) (HCC) [I61.9] Stroke, acute, thrombotic (HCC) [I63.9] Acute CVA (cerebrovascular accident) (HCC) [I63.9] PMH:  Past Medical History:  Diagnosis Date   Arthritis    Cervical spine disease    CHF (congestive heart failure) (HCC)    Depression    GERD (gastroesophageal reflux disease)    Headache    migraines in the "80's"   Hyperlipidemia    Hypertension    Myocardial infarction (HCC)    Peripheral vascular disease (HCC)    Stroke (HCC)    x 2   Medications:  Scheduled Meds:  aspirin EC  81 mg Oral Daily   atorvastatin  80 mg Oral Daily   cholecalciferol  1,000 Units Oral Daily   citalopram  20 mg Oral Daily   clopidogrel  75 mg Oral Daily   colchicine  0.6 mg Oral BID   gabapentin  300 mg Oral TID   lisinopril  40 mg Oral Daily   pantoprazole  40 mg Oral Daily   polyethylene glycol  17 g Oral Daily   rivaroxaban  10 mg Oral Daily   senna-docusate  2 tablet Oral BID  Labs: No results for input(s): NA, K, CL, CO2, BUN, CREATININE, CALCIUM, MG, PHOS, GLUCOSE in the last 168 hours.  Wt Readings from Last 15 Encounters:  04/26/21 91.3 kg  11/15/20 87.5 kg  10/05/20 83.5 kg  05/26/20 83 kg  05/24/20 82.9 kg  05/16/20 81.6 kg  04/10/20 83.9 kg  03/30/20 82.1 kg  03/02/20 83.6 kg  12/30/19 78.5 kg  12/22/19 80.1 kg  12/07/19 80.3 kg  11/26/19 80.3 kg  11/25/19 80.7 kg  03/15/19 77.6 kg  Body mass index is 31.52 kg/m. Patient meets criteria for obesity based on current BMI.   Current diet order is regular, patient is consuming approximately 100% of meals at this time. Per MD, pt is stable for discharge; orders have been signed. No nutrition interventions warranted at this time. If nutrition issues arise, please consult RD.   Eugene Gavia, MS, RD, LDN (she/her/hers) RD pager number and  weekend/on-call pager number located in Amion.

## 2021-05-03 NOTE — Plan of Care (Signed)
Problem: Education: Goal: Knowledge of General Education information will improve Description: Including pain rating scale, medication(s)/side effects and non-pharmacologic comfort measures 05/03/2021 1329 by Geofrey Silliman, Quitman Livings, RN Outcome: Adequate for Discharge 05/03/2021 1110 by Wells Mabe, Quitman Livings, RN Outcome: Progressing   Problem: Health Behavior/Discharge Planning: Goal: Ability to manage health-related needs will improve 05/03/2021 1329 by Glendale Wherry, Quitman Livings, RN Outcome: Adequate for Discharge 05/03/2021 1110 by Mostyn Varnell, Quitman Livings, RN Outcome: Progressing   Problem: Clinical Measurements: Goal: Ability to maintain clinical measurements within normal limits will improve 05/03/2021 1329 by Grettell Ransdell, Quitman Livings, RN Outcome: Adequate for Discharge 05/03/2021 1110 by Jean Alejos, Quitman Livings, RN Outcome: Progressing Goal: Will remain free from infection 05/03/2021 1329 by Deetta Siegmann, Quitman Livings, RN Outcome: Adequate for Discharge 05/03/2021 1110 by Izaac Reisig, Quitman Livings, RN Outcome: Progressing Goal: Diagnostic test results will improve 05/03/2021 1329 by Brady Plant, Quitman Livings, RN Outcome: Adequate for Discharge 05/03/2021 1110 by Kenechukwu Eckstein, Quitman Livings, RN Outcome: Progressing Goal: Respiratory complications will improve 05/03/2021 1329 by Kolbee Stallman, Quitman Livings, RN Outcome: Adequate for Discharge 05/03/2021 1110 by Minyon Billiter, Quitman Livings, RN Outcome: Progressing Goal: Cardiovascular complication will be avoided 05/03/2021 1329 by Canaan Holzer, Quitman Livings, RN Outcome: Adequate for Discharge 05/03/2021 1110 by Danique Hartsough, Quitman Livings, RN Outcome: Progressing   Problem: Activity: Goal: Risk for activity intolerance will decrease 05/03/2021 1329 by Chandon Lazcano, Quitman Livings, RN Outcome: Adequate for Discharge 05/03/2021 1110 by Yvaine Jankowiak, Quitman Livings, RN Outcome: Progressing   Problem: Nutrition: Goal: Adequate nutrition will be maintained 05/03/2021 1329 by Ruperto Kiernan, Quitman Livings, RN Outcome: Adequate for Discharge 05/03/2021 1110 by  Daci Stubbe, Quitman Livings, RN Outcome: Progressing   Problem: Coping: Goal: Level of anxiety will decrease 05/03/2021 1329 by Dmari Schubring, Quitman Livings, RN Outcome: Adequate for Discharge 05/03/2021 1110 by Presley Summerlin, Quitman Livings, RN Outcome: Progressing   Problem: Elimination: Goal: Will not experience complications related to bowel motility 05/03/2021 1329 by Chaselyn Nanney, Quitman Livings, RN Outcome: Adequate for Discharge 05/03/2021 1110 by Cary Wilford, Quitman Livings, RN Outcome: Progressing Goal: Will not experience complications related to urinary retention 05/03/2021 1329 by Glyn Zendejas, Quitman Livings, RN Outcome: Adequate for Discharge 05/03/2021 1110 by Caterina Racine, Quitman Livings, RN Outcome: Progressing   Problem: Pain Managment: Goal: General experience of comfort will improve 05/03/2021 1329 by Darrio Bade, Quitman Livings, RN Outcome: Adequate for Discharge 05/03/2021 1110 by Shonteria Abeln, Quitman Livings, RN Outcome: Progressing   Problem: Safety: Goal: Ability to remain free from injury will improve 05/03/2021 1329 by Kwana Ringel, Quitman Livings, RN Outcome: Adequate for Discharge 05/03/2021 1110 by Kaycie Pegues, Quitman Livings, RN Outcome: Progressing   Problem: Skin Integrity: Goal: Risk for impaired skin integrity will decrease 05/03/2021 1329 by Elvenia Godden, Quitman Livings, RN Outcome: Adequate for Discharge 05/03/2021 1110 by Isham Smitherman, Quitman Livings, RN Outcome: Progressing   Problem: Education: Goal: Knowledge of secondary prevention will improve 05/03/2021 1329 by Chabely Norby, Quitman Livings, RN Outcome: Adequate for Discharge 05/03/2021 1110 by Riccardo Holeman, Quitman Livings, RN Outcome: Progressing Goal: Knowledge of patient specific risk factors addressed and post discharge goals established will improve 05/03/2021 1329 by Lizbet Cirrincione, Quitman Livings, RN Outcome: Adequate for Discharge 05/03/2021 1110 by Jacere Pangborn, Quitman Livings, RN Outcome: Progressing   Problem: Self-Care: Goal: Ability to participate in self-care as condition permits will improve 05/03/2021 1329 by Khyler Urda, Quitman Livings,  RN Outcome: Adequate for Discharge 05/03/2021 1110 by Javeah Loeza, Quitman Livings, RN Outcome: Progressing   Problem: Education: Goal: Knowledge of risk factors and measures for prevention of condition will improve 05/03/2021 1329 by Braxtyn Dorff, Quitman Livings, RN Outcome: Adequate for Discharge 05/03/2021 1110 by Faydra Korman,  Clarann Helvey Elbert Ewings, RN Outcome: Progressing   Problem: Coping: Goal: Psychosocial and spiritual needs will be supported 05/03/2021 1329 by Layana Konkel, Quitman Livings, RN Outcome: Adequate for Discharge 05/03/2021 1110 by Marwan Lipe, Quitman Livings, RN Outcome: Progressing   Problem: Respiratory: Goal: Will maintain a patent airway 05/03/2021 1329 by Monroe Qin, Quitman Livings, RN Outcome: Adequate for Discharge 05/03/2021 1110 by Devyon Keator, Quitman Livings, RN Outcome: Progressing Goal: Complications related to the disease process, condition or treatment will be avoided or minimized 05/03/2021 1329 by Ciela Mahajan, Quitman Livings, RN Outcome: Adequate for Discharge 05/03/2021 1110 by Finnian Husted, Quitman Livings, RN Outcome: Progressing

## 2021-05-04 DIAGNOSIS — I639 Cerebral infarction, unspecified: Secondary | ICD-10-CM | POA: Diagnosis not present

## 2021-05-04 LAB — CBC
HCT: 39.7 % (ref 39.0–52.0)
Hemoglobin: 12.4 g/dL — ABNORMAL LOW (ref 13.0–17.0)
MCH: 26.3 pg (ref 26.0–34.0)
MCHC: 31.2 g/dL (ref 30.0–36.0)
MCV: 84.3 fL (ref 80.0–100.0)
Platelets: 160 10*3/uL (ref 150–400)
RBC: 4.71 MIL/uL (ref 4.22–5.81)
RDW: 13.9 % (ref 11.5–15.5)
WBC: 5.3 10*3/uL (ref 4.0–10.5)
nRBC: 0 % (ref 0.0–0.2)

## 2021-05-04 MED ORDER — DICLOFENAC SODIUM 1 % EX GEL: 2.0000 g | Freq: Four times a day (QID) | CUTANEOUS | 0 refills | Status: AC | PRN

## 2021-05-04 MED ORDER — TRAZODONE HCL 100 MG PO TABS: 100.0000 mg | ORAL_TABLET | Freq: Every evening | ORAL | 0 refills | Status: AC | PRN

## 2021-05-04 MED ORDER — ALLOPURINOL 100 MG PO TABS: 50.0000 mg | ORAL_TABLET | Freq: Every day | ORAL | 0 refills | Status: AC

## 2021-05-04 MED ORDER — CITALOPRAM HYDROBROMIDE 20 MG PO TABS: 20.0000 mg | ORAL_TABLET | Freq: Every day | ORAL | 0 refills | Status: AC

## 2021-05-04 MED ORDER — PREDNISONE 5 MG PO TABS: 5.0000 mg | ORAL_TABLET | Freq: Every day | ORAL | 0 refills | Status: AC

## 2021-05-04 NOTE — Progress Notes (Signed)
Occupational Therapy Treatment Patient Details Name: Marcus [name] MRN: [id number] DOB: [birth year] Today's Date: 05/04/2021    History of present illness 70 yo male presenting to ED on 8/10 with L sided weakness and ataxic gait. MRI showing acute R pons and L occipital infarcts. PMH including GERD, depression, CHF, HLD, HTN, CKD III, severe PAD s/p Fem/Pop bypass 2021, CAD s/p MI in 3/22 and CABG, PCI to right coronary artery 3/22, ankylosing spondylitis of spine, and subacute left pontine infarct in 2017.   OT comments  Patient with continued progress toward goals.  Continues to need safety cues and balance support, but is needing less assist for lower body ADL, Min A this date.  Patient is being discharged to SNF today, OT will follow if discharge is canceled.     Follow Up Recommendations  SNF    Equipment Recommendations  None recommended by OT    Recommendations for Other Services      Precautions / Restrictions Precautions Precautions: Fall Restrictions Weight Bearing Restrictions: No       Mobility Bed Mobility Overal bed mobility: Needs Assistance Bed Mobility: Supine to Sit     Supine to sit: Supervision       Patient Response: Cooperative  Transfers Overall transfer level: Needs assistance Equipment used: Rolling walker (2 wheeled) Transfers: Sit to/from Stand Sit to Stand: Min guard              Balance Overall balance assessment: Needs assistance Sitting-balance support: Feet unsupported;No upper extremity supported Sitting balance-Leahy Scale: Fair     Standing balance support: Bilateral upper extremity supported;During functional activity Standing balance-Leahy Scale: Poor                             ADL either performed or assessed with clinical judgement   ADL       Grooming: Min guard;Minimal assistance;Standing;Wash/dry hands;Wash/dry face;Oral care Grooming Details (indicate cue type and reason): balance assist -  shaving Upper Body Bathing: Standing;Minimal assistance       Upper Body Dressing : Set up;Supervision/safety;Sitting   Lower Body Dressing: Minimal assistance;Sit to/from stand   Toilet Transfer: Min guard;Regular Toilet;RW;Ambulation   Toileting- Architect and Hygiene: Sit to/from stand;Sitting/lateral lean;Supervision/safety                                 Cognition Arousal/Alertness: Awake/alert Behavior During Therapy: WFL for tasks assessed/performed Overall Cognitive Status: Impaired/Different from baseline Area of Impairment: Safety/judgement                         Safety/Judgement: Decreased awareness of safety;Decreased awareness of deficits   Problem Solving: Slow processing;Requires verbal cues            Progress Toward Goals  OT Goals(current goals can now be found in the care plan section)  Progress towards OT goals: Progressing toward goals  Acute Rehab OT Goals Patient Stated Goal: I want to be back home by my son's birthday OT Goal Formulation: With patient Time For Goal Achievement: 05/16/21 Potential to Achieve Goals: Good  Plan Discharge plan remains appropriate    Co-evaluation                 AM-PAC OT "6 Clicks" Daily Activity     Outcome Measure   Help from another person eating meals?: None Help from another person  taking care of personal grooming?: None Help from another person toileting, which includes using toliet, bedpan, or urinal?: A Little Help from another person bathing (including washing, rinsing, drying)?: A Little Help from another person to put on and taking off regular upper body clothing?: None Help from another person to put on and taking off regular lower body clothing?: A Little 6 Click Score: 21    End of Session Equipment Utilized During Treatment: Gait belt;Rolling walker  OT Visit Diagnosis: Unsteadiness on feet (R26.81);Other abnormalities of gait and mobility  (R26.89);Muscle weakness (generalized) (M62.81);History of falling (Z91.81);Other symptoms and signs involving cognitive function;Pain   Activity Tolerance Patient tolerated treatment well   Patient Left in chair;with call bell/phone within reach;with chair alarm set   Nurse Communication          Time: 1000-1020 OT Time Calculation (min): 20 min  Charges: OT General Charges $OT Visit: 1 Visit OT Treatments $Self Care/Home Management : 8-22 mins  05/04/2021  RP, OTR/L  Acute Rehabilitation Services  Office:  [phone]    Suzanna Obey 05/04/2021, 10:47 AM

## 2021-05-04 NOTE — TOC Transition Note (Signed)
Transition of Care [facility]) - CM/SW Discharge Note   Patient Details  Name: [name] MRN: [id number] Date of Birth: 1951-03-02  [facility] CM/SW Contact:  Kermit Balo, RN Phone Number: 05/04/2021, 11:01 AM   Clinical Narrative:    Pt is discharging to [facility] today. [facility] to provide transport. Bedside RN updated and d/c packet at the desk.  Room: 203P Number for report: [id number]   Final next level of care: Skilled Nursing Facility Barriers to Discharge: No Barriers Identified   Patient Goals and CMS Choice   CMS Medicare.gov Compare Post Acute Care list provided to:: Patient Choice offered to / list presented to : Patient  Discharge Placement              [facility] bed at[facility] Patient to be transferred to facility by: [facility] Name of family member notified: son--[name] Patient and family notified of of transfer: 05/04/21  Discharge Plan and Services In-house Referral: Clinical Social Work Discharge Planning Services: CM Consult Post Acute Care Choice: IP Rehab                               Social Determinants of Health (SDOH) Interventions     Readmission Risk Interventions Readmission Risk Prevention Plan 12/09/2019  Transportation Screening Complete  PCP or Specialist Appt within 5-7 Days Complete  Home Care Screening Complete  Medication Review (RN CM) Complete  Some recent data might be hidden

## 2021-05-04 NOTE — Discharge Summary (Signed)
Name: Marcus [name] MRN: [id number] DOB: 11/22/1950 70 y.o. PCP: Pcp, No  Date of Admission: 04/18/2021 11:32 AM Date of Discharge:  05/04/2021 Attending Physician: Dr. 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 Left heel pain 2/2 stage 1 pressure injury vs gout flare of heel B12 deficiency Covid-19 Cocaine Use Disorder    DISCHARGE MEDICATIONS:   Allergies as of 05/04/2021       Reactions   Other Other (See Comments)   Tomato- hives   Penicillins Hives, Other (See Comments)   Has patient had a PCN reaction causing immediate rash, facial/tongue/throat swelling, SOB or lightheadedness with hypotension: No Has patient had a PCN reaction causing severe rash involving mucus membranes or skin necrosis: No Has patient had a PCN reaction that required hospitalization No Has patient had a PCN reaction occurring within the last 10 years: No If all of the above answers are "NO", then may proceed with Cephalosporin use.        Medication List     STOP taking these medications    metoprolol tartrate 50 MG tablet Commonly known as: LOPRESSOR   omeprazole 20 MG capsule Commonly known as: PRILOSEC       TAKE these medications    allopurinol 100 MG tablet Commonly known as: Zyloprim Take 0.5 tablets (50 mg total) by mouth daily.   aspirin 81 MG EC tablet Take 1 tablet (81 mg total) by mouth daily at 6 (six) AM.   atorvastatin 80 MG tablet Commonly known as: LIPITOR Take 80 mg by  mouth daily.   cholecalciferol 25 MCG (1000 UNIT) tablet Commonly known as: VITAMIN D3 Take 1,000 Units by mouth daily. What changed: Another medication with the same name was removed. Continue taking this medication, and follow the directions you see here.   citalopram 20 MG tablet Commonly known as: CELEXA Take 1 tablet (20 mg total) by mouth daily.   clopidogrel 75 MG tablet Commonly known as: PLAVIX Take 1 tablet (75 mg total) by mouth daily with breakfast for 8 days.   diclofenac Sodium 1 % Gel Commonly known as: Voltaren Apply 2 g topically 4 (four) times daily as needed (As needed for L heel pain).   furosemide 40 MG tablet Commonly known as: LASIX Take 40 mg by mouth daily.   gabapentin 300 MG capsule Commonly known as: NEURONTIN Take 300 mg by mouth 3 (three) times daily.   HYDROcodone-acetaminophen 10-325 MG tablet Commonly known as: NORCO Take 1 tablet by mouth every 6 (six) hours as needed for moderate pain.   isosorbide mononitrate 30 MG 24 hr tablet Commonly known as: IMDUR Take 0.5 tablets (15 mg total) by mouth daily.   lisinopril 40 MG tablet Commonly known as: ZESTRIL Take 40 mg by mouth daily.   nitroGLYCERIN 0.4 MG/SPRAY spray Commonly known as: NITROLINGUAL Place 1 spray under the tongue every 5 (five) minutes as needed for chest pain.   pantoprazole 40 MG tablet Commonly known as: PROTONIX Take 1 tablet (40 mg total) by mouth daily.   predniSONE 5  MG tablet Commonly known as: DELTASONE Take 1 tablet (5 mg total) by mouth daily with breakfast for 5 days.   traZODone 100 MG tablet Commonly known as: DESYREL Take 1 tablet (100 mg total) by mouth at bedtime as needed for sleep.               Durable Medical Equipment  (From admission, onward)           Start     Ordered   05/03/21 1229  DME Walker  Once       Question Answer Comment  Walker: With 5 Inch Wheels   Patient needs a walker to treat with the following condition  Stroke [facility])      05/03/21 1228            DISPOSITION AND FOLLOW-UP:  Marcus [name] was discharged from [facility] in Stable condition. At the hospital follow up visit please address:  Follow-up Recommendations: Consults: Cardiology, Neurology Labs:  none Medications: Plavix end date 9/1  1. Acute cryptogenic ischemic stroke of R pons and L occipital lobe: Patient started on DAPT for cryptogenic stroke. Plavix end date is 9/1. Etiology ischemic vs cardio embolic, 30d event monitor will ship to SNF to evaluate for afib. Patient will need to follow up with Neurology and Cardiology. 2. Depression with suicidal thoughts: SNF will help arrange follow up with PCP after discharge from SNF 3. 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 Start with dose  daily, will need dose titration by 100 mg every two to four weeks to reach and maintain the urate-lowering goal range <6 mg/dL  4. Left heel pain 2/2 stage 1 pressure injury vs gout flare of heel: Patient will continue on home dose NORCO. Steroids may help with symptoms is related to gout flare of heel.  5. B12 deficiency: B12 on arrival was mildly low 176, was given single dose IM B12 1000u. Will need to follow up B12.  Follow-up Appointments:  Follow-up Information     [facility]. Schedule an appointment as soon as possible for a visit in 1 month(s).   Specialty: Neurology Why: stroke clinic Contact information: [address] [place] [address] [phone]        Orpah Cobb, MD. Call in 1 month(s).   Specialty: Cardiology Why: Call your cardiologist for a follow up appointment in 1 month Contact information: [address] [place] [address] [phone]                 HOSPITAL COURSE:  Patient Summary: Marcus [name] is a 70 year old person multiple arterial  clots history including 3 myocardial infarctions, stroke in 2017, an PAD who is admitted for two small acute cerebral infarctions at the right pons and left occipital lobe.  #Acute ischemic strokes of R pons and L occipital lobe Presented with acute left sided weakness and numbness which evolved into warm/tingling paresthesias. 04/18/21 MRI revealed small acute infarcts of R pons and L occipital lobe in addition to chronic small vessel infarcts. Patient was out of the window for tPA. 04/18/21 CTA Head and Neck showing 60% stenosis of R carotid bulb and focal high grade stenosis of proximal L inferior M2, also R PCA with mild stenosis. Pt with 45+ pack year history and not regularly adherent to risk reducing medications including Plavix, ASA, and Lipitor. LDL at 140 and  A1C at 6.2. Permissive HTN allowed for first 48 hours and home 81mg  ASA, 75mg  Plavix, and 80mg  Lipitor were all restarted. Echo on 8/11 showed EF of 55-60% with no intracardiac source of embolism detected. Neurology recommended DAPT with 75mg  Plavix and 81mg  ASA for 3 weeks, followed by 81mg  ASA. Plavix therapy will be completed on 9/1. Patient also recommended to have a 30 day cardiac event monitor upon discharge to rule out Afib. Patient stable to be discharged to SNF. Patient will follow up with Neurology and Cardiology.  #Depression with suicidal thoughts Patient with history of depression, and acutely worsened after this most recent stroke. Describes "not wanting to be a burden," to his son and family who he has been living with. No active plan for suicide. Was evaluated by Pscyh and increased to 20mg  Celexa and started on 100mg  Trazodone nightly. Will be discharged with these medications and will require outpatient follow up with PCP.   #Gout in left great toe Patient has had gout flares in the past and feels that his symptoms are similar to what he has experienced. Reports improvement with colchicine and his pain is well controlled with  current regimen. Colchicine therapy finished on the day of discharge. Patient will start on Allopurinol for prevention of future gout flares. We will start this medication at discharge with 5 day course of steroids to prevent acute gout flare upon starting allopurinol. Start with dose 100mg  daily, will need dose titration by 100 mg every two to four weeks to reach and maintain the urate-lowering goal range <6 mg/dL   #Hypertension Restarted on home lisinopril 40mg  after stroke. Home dose of metoprolol discontinued on 8/21 due to bradycardia in the 50s. Will be discharged to SNF on all of his home meds.   #Left heel pain 2/2 stage p1 pressure injury vs acute gout of heel  Patient started to have left heel pain on 8/13, thought to be secondary to stage 1 pressure wound. 8/15 XR of L foot/ankle was negative for any bony or soft tissue abnormality. Pain improved with medical management and with propping foot up on soft pillow. Continued on home dose of Norco and gabapentin for foot pain. Started on short course of steroids as pain could also be 2/2 to gout flare.  #B12 deficiency 04/18/21 B12 low at 176 and patient was given B12 IM. Patient was continued on home vitamin D supplement and no further medical management requirement.    DISCHARGE INSTRUCTIONS:   Discharge Instructions     Ambulatory referral to Neurology   Complete by: As directed    Follow up with stroke clinic NP (Jessica Vanschaick or 11/1, if both not available, consider , or [place]) at [facility] in about 4 weeks. Thanks.   Care order/instruction   Complete by: As directed    Order to place 30 day cardiac event monitor at SNF, duration one month, to start as soon as monitor arrives to facility via mail.   Diet - low sodium heart healthy   Complete by: As directed    Diet - low sodium heart healthy   Complete by: As directed    Increase activity slowly   Complete by: As directed    Increase activity  slowly   Complete by: As directed        SUBJECTIVE:  Mr. Mellin was seen and evaluated on the day of discharge. He is feeling well overall and his pain in his left foot/toe is improved. He continues to  endorse numbness/tingling in his left upper and lower extremity, but this is also improved from his initial presentation.   Discharge Vitals:   BP 127/80 (BP Location: Left Arm)   Pulse 75   Temp 97.7 F (36.5 C) (Oral)   Resp 17   Ht 5\' 7"  (1.702 m)   Wt 91.3 kg   SpO2 95%   BMI 31.52 kg/m   OBJECTIVE:  General: Well appearing african male, NAD CV: regular rate, normal rhythm, no murmurs, rubs, gallops. Pulmonary: sating well on RA, lung clear to auscultation, no rales, wheezes, rhonchi Abdominal: non-distended, soft, non-tender to palpation, normal BS Skin: Warm and dry, no rashes or lesions Neurological: MS: awake, alert and oriented x3, baseline speech and fund of knowledge Motor: moves all extremities antigravity Psych: normal affect  Pertinent Labs, Studies, and Procedures:  CBC Latest Ref Rng & Units 05/04/2021 04/27/2021 04/18/2021  WBC 4.0 - 10.5 K/uL 5.3 5.5 5.2  Hemoglobin 13.0 - 17.0 g/dL 12.4(L) 13.1 14.3  Hematocrit 39.0 - 52.0 % 39.7 41.5 45.8  Platelets 150 - 400 K/uL 160 190 157    CMP Latest Ref Rng & Units 04/26/2021 04/25/2021 04/24/2021  Glucose 70 - 99 mg/dL 04/26/2021) 258(N) 277(O)  BUN 8 - 23 mg/dL 16 16 15   Creatinine 0.61 - 1.24 mg/dL 242(P) ) 5.36(R)  Sodium 135 - 145 mmol/L 138 137 136  Potassium 3.5 - 5.1 mmol/L 3.9 4.0 4.0  Chloride 98 - 111 mmol/L 101 103 103  CO2 22 - 32 mmol/L 27 27 27   Calcium 8.9 - 10.3 mg/dL 9.4 9.0 4.43(X)  Total Protein 6.5 - 8.1 g/dL - - -  Total Bilirubin 0.3 - 1.2 mg/dL - - -  Alkaline Phos 38 - 126 U/L - - -  AST 15 - 41 U/L - - -  ALT 0 - 44 U/L - - -    FOLLOW-UP INSTRUCTIONS:  Thank you for allowing 5.40(G to be part of your care. You were hospitalized for Acute cerebral infarction  [facility]).  Please follow up with the following providers: A. [facility] in 1 month; call [phone] to make an appt  B. Dr. [place] (Cardiology) in 1 month: Please call [phone] to make an appt  Please note these changes made to your medications:   A. Medications to continue: Current Meds  Medication Sig   cholecalciferol (VITAMIN D3) 25 MCG (1000 UNIT) tablet Take 1,000 Units by mouth daily.   lisinopril (ZESTRIL) 40 MG tablet Take 40 mg by mouth daily.   metoprolol tartrate (LOPRESSOR) 50 MG tablet Take 50 mg by mouth 2 (two) times daily.   omeprazole (PRILOSEC) 20 MG capsule Take 20 mg by mouth daily.   allopurinol (ZYLOPRIM) 100 MG tablet Take 0.5 tablets (50 mg total) by mouth daily.   diclofenac Sodium (VOLTAREN) 1 % GEL Apply 2 g topically 4 (four) times daily as needed (As needed for L heel pain).   predniSONE (DELTASONE) 5 MG tablet Take 1 tablet (5 mg total) by mouth daily with breakfast for 5 days.      B. Medications to start:  Current Facility-Administered Medications:    allopurinol (ZYLOPRIM) tablet 50 mg, 50 mg, Oral, Daily   aspirin EC tablet 81 mg, 81 mg, Oral, Daily   atorvastatin (LIPITOR) tablet 80 mg, 80 mg, Oral, Daily   cholecalciferol (VITAMIN D3) tablet 1,000 Units, 1,000 Units, Oral, Daily   citalopram (CELEXA) tablet 20 mg, 20 mg, Oral, Daily   clopidogrel (PLAVIX) tablet 75 mg,  75 mg, Oral, Daily   gabapentin (NEURONTIN) capsule 300 mg, 300 mg, Oral, TID   HYDROcodone-acetaminophen (NORCO) 10-325 MG per tablet 1 tablet, 1 tablet, Oral, Q6H PRN   lisinopril (ZESTRIL) tablet 40 mg, 40 mg, Oral, Daily   pantoprazole (PROTONIX) EC tablet 40 mg, 40 mg, Oral, Daily   polyethylene glycol (MIRALAX / GLYCOLAX) packet 17 g, 17 g, Oral, Daily   predniSONE (DELTASONE) tablet 5 mg, 5 mg, Oral, Q breakfast   rivaroxaban (XARELTO) tablet 10 mg, 10 mg, Oral, Daily   senna-docusate (Senokot-S) tablet 2 tablet, 2 tablet, Oral, BID   traZODone  (DESYREL) tablet 100 mg, 100 mg, Oral, QHS PRN  C. Medications to discontinue:  Metoprolol    Please make sure to follow up with neurology and cardiology in 1 month. Please continue to take Plavix 75mg  until 9/1.   Please call our clinic if you have any questions or concerns, we may be able to help and keep you from a long and expensive emergency room wait. Our clinic and after hours phone number is [phone], the best time to call is Monday through Friday 9 am to 4 pm but there is always someone available 24/7 if you have an emergency. If you need medication refills please notify your pharmacy one week in advance and they will send Tuesday a request.         Signed: [place], D.O.  Internal Medicine Resident, PGY-1 [facility]  Pager: [phone] 7:40 AM, 05/04/2021

## 2021-05-04 NOTE — Discharge Instructions (Signed)
FOLLOW-UP INSTRUCTIONS:  Thank you for allowing [place] to be part of your care. You were hospitalized for Acute cerebral infarction [facility]).  Please follow up with the following providers: A. [facility] in 1 month; call [phone] to make an appt  B. Dr. Viviano Simas (Cardiology) in 1 month: Please call [phone] to make an appt  Please note these changes made to your medications:   A. Medications to continue: Current Meds  Medication Sig   cholecalciferol (VITAMIN D3) 25 MCG (1000 UNIT) tablet Take 1,000 Units by mouth daily.   lisinopril (ZESTRIL) 40 MG tablet Take 40 mg by mouth daily.   metoprolol tartrate (LOPRESSOR) 50 MG tablet Take 50 mg by mouth 2 (two) times daily.   omeprazole (PRILOSEC) 20 MG capsule Take 20 mg by mouth daily.   allopurinol (ZYLOPRIM) 100 MG tablet Take 0.5 tablets (50 mg total) by mouth daily.   diclofenac Sodium (VOLTAREN) 1 % GEL Apply 2 g topically 4 (four) times daily as needed (As needed for L heel pain).   predniSONE (DELTASONE) 5 MG tablet Take 1 tablet (5 mg total) by mouth daily with breakfast for 5 days.      B. Medications to start:  Current Facility-Administered Medications:    allopurinol (ZYLOPRIM) tablet 50 mg, 50 mg, Oral, Daily   aspirin EC tablet 81 mg, 81 mg, Oral, Daily   atorvastatin (LIPITOR) tablet 80 mg, 80 mg, Oral, Daily   cholecalciferol (VITAMIN D3) tablet 1,000 Units, 1,000 Units, Oral, Daily   citalopram (CELEXA) tablet 20 mg, 20 mg, Oral, Daily   clopidogrel (PLAVIX) tablet 75 mg, 75 mg, Oral, Daily   gabapentin (NEURONTIN) capsule 300 mg, 300 mg, Oral, TID   HYDROcodone-acetaminophen (NORCO) 10-325 MG per tablet 1 tablet, 1 tablet, Oral, Q6H PRN   lisinopril (ZESTRIL) tablet 40 mg, 40 mg, Oral, Daily   pantoprazole (PROTONIX) EC tablet 40 mg, 40 mg, Oral, Daily   polyethylene glycol (MIRALAX / GLYCOLAX) packet 17 g, 17 g, Oral, Daily   predniSONE (DELTASONE) tablet 5 mg, 5 mg, Oral, Q breakfast    rivaroxaban (XARELTO) tablet 10 mg, 10 mg, Oral, Daily   senna-docusate (Senokot-S) tablet 2 tablet, 2 tablet, Oral, BID   traZODone (DESYREL) tablet 100 mg, 100 mg, Oral, QHS PRN  C. Medications to discontinue:  Metoprolol    Please make sure to follow up with neurology and cardiology in 1 month. Please continue to take Plavix 75mg  until 9/1.   Please call our clinic if you have any questions or concerns, we may be able to help and keep you from a long and expensive emergency room wait. Our clinic and after hours phone number is [phone], the best time to call is Monday through Friday 9 am to 4 pm but there is always someone available 24/7 if you have an emergency. If you need medication refills please notify your pharmacy one week in advance and they will send Saturday a request.

## 2021-05-04 NOTE — TOC Progression Note (Signed)
Transition of Care [facility]) - Progression Note    Patient Details  Name: KELLIS MCADAM MRN: [id number] Date of Birth: [birth year]  Transition of Care [facility]) CM/SW [place], [place] Phone Number: 05/04/2021, 11:18 AM  Clinical Narrative:   CSW worked throughout the day on discharge plan. Patient was supposed to discharge to [facility], but after sending discharge summary to them they did not realize that the patient was positive for cocaine on admission and they are unable to offer. CSW contacted other bed offers for the patient: [facility] all unable to offer. [facility] is able to offer. CSW met with patient and he is agreeable. [facility] is unable to accept until tomorrow. CSW contacted [employer] to request change of SNF, awaiting approval. CSW to follow.    Expected Discharge Plan: Skilled Nursing Facility Barriers to Discharge: Insurance Authorization  Expected Discharge Plan and Services Expected Discharge Plan: [place] In-house Referral: Clinical Social Work Discharge Planning Services: CM Consult Post Acute Care Choice: IP Rehab Living arrangements for the past 2 months: Single Family Home Expected Discharge Date: 05/04/21                                     Social Determinants of Health (SDOH) Interventions    Readmission Risk Interventions Readmission Risk Prevention Plan 12/09/2019  Transportation Screening Complete  PCP or Specialist Appt within 5-7 Days Complete  Home Care Screening Complete  Medication Review (RN CM) Complete  Some recent data might be hidden

## 2021-05-04 NOTE — Plan of Care (Signed)
  Problem: Education: Goal: Knowledge of General Education information will improve Description: Including pain rating scale, medication(s)/side effects and non-pharmacologic comfort measures Outcome: Adequate for Discharge   Problem: Health Behavior/Discharge Planning: Goal: Ability to manage health-related needs will improve Outcome: Adequate for Discharge   Problem: Clinical Measurements: Goal: Ability to maintain clinical measurements within normal limits will improve Outcome: Adequate for Discharge Goal: Will remain free from infection Outcome: Adequate for Discharge Goal: Diagnostic test results will improve Outcome: Adequate for Discharge Goal: Respiratory complications will improve Outcome: Adequate for Discharge Goal: Cardiovascular complication will be avoided Outcome: Adequate for Discharge   Problem: Activity: Goal: Risk for activity intolerance will decrease Outcome: Adequate for Discharge   Problem: Nutrition: Goal: Adequate nutrition will be maintained Outcome: Adequate for Discharge   Problem: Coping: Goal: Level of anxiety will decrease Outcome: Adequate for Discharge   Problem: Elimination: Goal: Will not experience complications related to bowel motility Outcome: Adequate for Discharge Goal: Will not experience complications related to urinary retention Outcome: Adequate for Discharge   Problem: Pain Managment: Goal: General experience of comfort will improve Outcome: Adequate for Discharge   Problem: Safety: Goal: Ability to remain free from injury will improve Outcome: Adequate for Discharge   Problem: Skin Integrity: Goal: Risk for impaired skin integrity will decrease Outcome: Adequate for Discharge   Problem: Education: Goal: Knowledge of secondary prevention will improve Outcome: Adequate for Discharge Goal: Knowledge of patient specific risk factors addressed and post discharge goals established will improve Outcome: Adequate for  Discharge   Problem: Self-Care: Goal: Ability to participate in self-care as condition permits will improve Outcome: Adequate for Discharge   Problem: Education: Goal: Knowledge of risk factors and measures for prevention of condition will improve Outcome: Adequate for Discharge   Problem: Coping: Goal: Psychosocial and spiritual needs will be supported Outcome: Adequate for Discharge   Problem: Respiratory: Goal: Will maintain a patent airway Outcome: Adequate for Discharge Goal: Complications related to the disease process, condition or treatment will be avoided or minimized Outcome: Adequate for Discharge

## 2021-05-04 NOTE — Progress Notes (Signed)
Order to discharge pt Marcus [name] SNF.  Discharge instructions/AVS placed in pt packet.  Attempted to call report to facility.  Pt transported via [facility]AR.

## 2021-05-09 ENCOUNTER — Encounter: Payer: Self-pay | Admitting: *Deleted

## 2021-05-09 NOTE — Progress Notes (Signed)
Patient ID: Marcus [name], male   DOB: August 28, 1951, 70 y.o.   MRN: [id number] Patient enrolled for [employer] to ship a 30 day cardiac event monitor to [facility] in care of Sher Hellinger [address] [place], [place]  [address] Attn: Director of Nursing, [facility] [phone] Letter with instructions mailed to same address.

## 2021-05-15 ENCOUNTER — Ambulatory Visit (INDEPENDENT_AMBULATORY_CARE_PROVIDER_SITE_OTHER): Payer: Medicare HMO

## 2021-05-15 DIAGNOSIS — I639 Cerebral infarction, unspecified: Secondary | ICD-10-CM | POA: Diagnosis not present

## 2021-05-15 DIAGNOSIS — I4891 Unspecified atrial fibrillation: Secondary | ICD-10-CM

## 2021-06-19 ENCOUNTER — Other Ambulatory Visit: Payer: Self-pay | Admitting: Cardiology

## 2021-06-19 DIAGNOSIS — I639 Cerebral infarction, unspecified: Secondary | ICD-10-CM

## 2021-06-19 DIAGNOSIS — I4891 Unspecified atrial fibrillation: Secondary | ICD-10-CM

## 2021-06-20 ENCOUNTER — Telehealth: Payer: Self-pay | Admitting: Student

## 2021-06-20 ENCOUNTER — Inpatient Hospital Stay: Payer: Medicare HMO | Admitting: Adult Health

## 2021-06-20 NOTE — Telephone Encounter (Signed)
Marcus [name] is returning Ivy's call for his results.

## 2021-06-20 NOTE — Telephone Encounter (Signed)
Loa Socks, LPN  43/83/8184 10:36 AM EDT     Pts monitor results were sent to Dr. Algie Coffer via epic fax function.         The patient has been notified of the result and verbalized understanding.  All questions (if any) were answered.     Pt aware I will forward this result to Dr. Algie Coffer for his review.  Pt verbalized understanding and agrees with this plan.

## 2021-06-20 NOTE — Telephone Encounter (Signed)
-----   Message from Ellsworth Lennox, [place] sent at 06/20/2021  7:56 AM EDT ----- Covering for Nada Boozer - Please let the patient know his event monitor showed normal sinus rhythm with no significant arrhythmias to account for his recent CVA. Appears the monitor was ordered by [facility] at the request of Neurology but the patient is followed by Dr. Algie Coffer. Please forward results to him.

## 2021-08-18 ENCOUNTER — Emergency Department (HOSPITAL_COMMUNITY): Payer: Medicare HMO

## 2021-08-18 ENCOUNTER — Encounter (HOSPITAL_COMMUNITY): Payer: Self-pay | Admitting: Emergency Medicine

## 2021-08-18 ENCOUNTER — Other Ambulatory Visit: Payer: Self-pay

## 2021-08-18 ENCOUNTER — Emergency Department (HOSPITAL_COMMUNITY)
Admission: EM | Admit: 2021-08-18 | Discharge: 2021-08-19 | Disposition: A | Discharge status: Home / Self Care | Payer: Medicare HMO | Attending: Emergency Medicine | Admitting: Emergency Medicine

## 2021-08-18 DIAGNOSIS — M25562 Pain in left knee: Secondary | ICD-10-CM | POA: Diagnosis present

## 2021-08-18 DIAGNOSIS — N183 Chronic kidney disease, stage 3 unspecified: Secondary | ICD-10-CM | POA: Insufficient documentation

## 2021-08-18 DIAGNOSIS — I2510 Atherosclerotic heart disease of native coronary artery without angina pectoris: Secondary | ICD-10-CM | POA: Diagnosis not present

## 2021-08-18 DIAGNOSIS — Z79899 Other long term (current) drug therapy: Secondary | ICD-10-CM | POA: Diagnosis not present

## 2021-08-18 DIAGNOSIS — Z87891 Personal history of nicotine dependence: Secondary | ICD-10-CM | POA: Diagnosis not present

## 2021-08-18 DIAGNOSIS — I130 Hypertensive heart and chronic kidney disease with heart failure and stage 1 through stage 4 chronic kidney disease, or unspecified chronic kidney disease: Secondary | ICD-10-CM | POA: Diagnosis not present

## 2021-08-18 DIAGNOSIS — Z7982 Long term (current) use of aspirin: Secondary | ICD-10-CM | POA: Diagnosis not present

## 2021-08-18 DIAGNOSIS — Z951 Presence of aortocoronary bypass graft: Secondary | ICD-10-CM | POA: Diagnosis not present

## 2021-08-18 DIAGNOSIS — I5041 Acute combined systolic (congestive) and diastolic (congestive) heart failure: Secondary | ICD-10-CM | POA: Insufficient documentation

## 2021-08-18 LAB — BASIC METABOLIC PANEL
Anion gap: 10 (ref 5–15)
BUN: 12 mg/dL (ref 8–23)
CO2: 24 mmol/L (ref 22–32)
Calcium: 9.2 mg/dL (ref 8.9–10.3)
Chloride: 103 mmol/L (ref 98–111)
Creatinine, Ser: 1.3 mg/dL — ABNORMAL HIGH (ref 0.61–1.24)
GFR, Estimated: 59 mL/min — ABNORMAL LOW (ref >60)
Glucose, Bld: 98 mg/dL (ref 70–99)
Potassium: 5.3 mmol/L — ABNORMAL HIGH (ref 3.5–5.1)
Sodium: 137 mmol/L (ref 135–145)

## 2021-08-18 LAB — CBC WITH DIFFERENTIAL/PLATELET
Abs Immature Granulocytes: 0.02 10*3/uL (ref 0.00–0.07)
Basophils Absolute: 0 10*3/uL (ref 0.0–0.1)
Basophils Relative: 0 %
Eosinophils Absolute: 0.1 10*3/uL (ref 0.0–0.5)
Eosinophils Relative: 1 %
HCT: 49.9 % (ref 39.0–52.0)
Hemoglobin: 15.3 g/dL (ref 13.0–17.0)
Immature Granulocytes: 0 %
Lymphocytes Relative: 38 %
Lymphs Abs: 2.9 10*3/uL (ref 0.7–4.0)
MCH: 26.5 pg (ref 26.0–34.0)
MCHC: 30.7 g/dL (ref 30.0–36.0)
MCV: 86.3 fL (ref 80.0–100.0)
Monocytes Absolute: 0.6 10*3/uL (ref 0.1–1.0)
Monocytes Relative: 8 %
Neutro Abs: 3.9 10*3/uL (ref 1.7–7.7)
Neutrophils Relative %: 53 %
Platelets: 184 10*3/uL (ref 150–400)
RBC: 5.78 MIL/uL (ref 4.22–5.81)
RDW: 14.3 % (ref 11.5–15.5)
WBC: 7.5 10*3/uL (ref 4.0–10.5)
nRBC: 0 % (ref 0.0–0.2)

## 2021-08-18 LAB — SYNOVIAL CELL COUNT + DIFF, W/ CRYSTALS
Crystals, Fluid: NONE SEEN
Eosinophils-Synovial: 2 % — ABNORMAL HIGH (ref 0–1)
Lymphocytes-Synovial Fld: 31 % — ABNORMAL HIGH (ref 0–20)
Monocyte-Macrophage-Synovial Fluid: 13 % — ABNORMAL LOW (ref 50–90)
Neutrophil, Synovial: 54 % — ABNORMAL HIGH (ref 0–25)
WBC, Synovial: 128 /mm3 (ref 0–200)

## 2021-08-18 LAB — SEDIMENTATION RATE: Sed Rate: 1 mm/hr (ref 0–16)

## 2021-08-18 LAB — URIC ACID: Uric Acid, Serum: 8.7 mg/dL — ABNORMAL HIGH (ref 3.7–8.6)

## 2021-08-18 MED ORDER — LIDOCAINE HCL (PF) 1 % IJ SOLN
10.0000 mL | Freq: Once | INTRAMUSCULAR | Status: AC
  Administered 2021-08-18: 10 mL via INTRADERMAL
  Filled 2021-08-18: qty 10

## 2021-08-18 MED ORDER — ACETAMINOPHEN 500 MG PO TABS
1000.0000 mg | ORAL_TABLET | Freq: Once | ORAL | Status: AC
Start: 2021-08-18 — End: 2021-08-18
  Administered 2021-08-18: 1000 mg via ORAL

## 2021-08-18 MED ORDER — HYDROCODONE-ACETAMINOPHEN 5-325 MG PO TABS
1.0000 | ORAL_TABLET | Freq: Once | ORAL | Status: AC
  Administered 2021-08-18: 1 via ORAL
  Filled 2021-08-18: qty 1

## 2021-08-18 NOTE — ED Provider Notes (Signed)
Emergency Medicine Provider Triage Evaluation Note  Marcus [name] , a 70 y.o. male  was evaluated in triage.  Pt complains of Pain in the left knee.  Pain has been ongoing for under a week.  No trauma.  He denies any fevers.  He states that the pain keeps him from ambulating at home.   Review of Systems  Positive: Left knee pain Negative: Numbness  Physical Exam  BP (!) 175/94   Pulse 78   Temp 98.7 F (37.1 C) (Oral)   Resp 18   SpO2 96%  Gen:   Awake, no distress   Resp:  Normal effort  MSK:   Left knee pain with any movement.   Other:  Able to doppler pulses DP/PT in the left foot.  Able to move toes on left foot. Sensation intact to left foot.   Medical Decision Making  Medically screening exam initiated at 4:48 PM.  Appropriate orders placed.  [name] was informed that the remainder of the evaluation will be completed by another provider, this initial triage assessment does not replace that evaluation, and the importance of remaining in the ED until their evaluation is complete.     [name] 08/18/21 1659    Cheryll Cockayne, MD 08/20/21 2342

## 2021-08-18 NOTE — ED Provider Notes (Signed)
[facility] EMERGENCY DEPARTMENT Provider Note   CSN: [id number] Arrival date & time: 08/18/21  1416     History Chief Complaint  Patient presents with   Knee Pain    Marcus [name] is a 70 y.o. male.  The history is provided by the patient and medical records.  Knee Pain Location:  Knee Time since incident:  1 week Injury: no   Knee location:  L knee Pain details:    Quality:  Aching   Radiates to:  Does not radiate   Duration:  1 week   Timing:  Constant   Progression:  Unchanged Chronicity:  New Prior injury to area:  No Relieved by:  Nothing Ineffective treatments:  None tried Associated symptoms: no back pain and no fever       Past Medical History:  Diagnosis Date   Arthritis    Cervical spine disease    CHF (congestive heart failure) (HCC)    Depression    GERD (gastroesophageal reflux disease)    Headache    migraines in the "80's"   Hyperlipidemia    Hypertension    Myocardial infarction [facility])    Peripheral vascular disease ([place])    Stroke (HCC)    x 2    Patient Active Problem List   Diagnosis Date Noted   Moderate episode of recurrent major depressive disorder ([place])    Acute CVA (cerebrovascular accident) ([place]) 04/19/2021   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   PAD (peripheral artery disease) ([place]) 12/07/2019   Peripheral vascular disease ([place]) 12/07/2019   Acute combined systolic and diastolic CHF, NYHA class 3 ([place]) 07/16/2016   Acute cerebral infarction ([place]) 07/16/2016   Benign essential HTN 07/16/2016   HLD (hyperlipidemia) 07/16/2016   Non compliance w medication regimen 07/16/2016   CKD (chronic kidney disease) stage 3, GFR 30-59 ml/min ([place]) 07/16/2016   Hypertension 03/11/2011   CAD (coronary artery disease) 03/11/2011    Past Surgical History:  Procedure Laterality Date   ABDOMINAL AORTOGRAM W/LOWER EXTREMITY Bilateral 09/15/2018   Procedure: ABDOMINAL AORTOGRAM W/LOWER EXTREMITY;  Surgeon: Serafina Mitchell, MD;   Location: [facility];  Service: Cardiovascular;  Laterality: Bilateral;   ABDOMINAL AORTOGRAM W/LOWER EXTREMITY N/A 11/26/2019   Procedure: ABDOMINAL AORTOGRAM W/LOWER EXTREMITY;  Surgeon: Elam Dutch, MD;  Location: [place] [facility];  Service: Cardiovascular;  Laterality: N/A;   breast lump removal     BYPASS GRAFT  12/07/2019   RIGHT FEMORAL-ABOVE KNEE POPLITEAL ARTERY BYPASS GRAFT USING PROPATEN GRAFT (Right Leg Upper   CORONARY ARTERY BYPASS GRAFT     FEMORAL-POPLITEAL BYPASS GRAFT Right 12/07/2019   Procedure: RIGHT FEMORAL-ABOVE KNEE POPLITEAL ARTERY BYPASS GRAFT USING PROPATEN GRAFT;  Surgeon: Elam Dutch, MD;  Location: [place];  Service: Vascular;  Laterality: Right;   INTRAOPERATIVE ARTERIOGRAM Right 12/07/2019   Procedure: Intra Operative Arteriogram;  Surgeon: Elam Dutch, MD;  Location: [place];  Service: Vascular;  Laterality: Right;   LEFT HEART CATHETERIZATION WITH CORONARY/GRAFT ANGIOGRAM Right 05/01/2012   Procedure: LEFT HEART CATHETERIZATION WITH Beatrix Fetters;  Surgeon: Birdie Riddle, MD;  Location: [facility];  Service: Cardiovascular;  Laterality: Right;   LOWER EXTREMITY ANGIOGRAPHY Right 05/25/2020   Procedure: LOWER EXTREMITY ANGIOGRAPHY;  Surgeon: Marty Heck, MD;  Location: [place] [facility];  Service: Cardiovascular;  Laterality: Right;   LOWER EXTREMITY ANGIOGRAPHY  05/26/2020   Procedure: Lower Extremity Angiography;  Surgeon: Waynetta Sandy, MD;  Location: [place];  Service: Cardiovascular;;   PERIPHERAL VASCULAR INTERVENTION Right 05/26/2020   Procedure: PERIPHERAL VASCULAR INTERVENTION;  Surgeon: Maeola Harman, MD;  Location: [facility] INVASIVE CV [facility];  Service: Cardiovascular;  Laterality: Right;  PERONEAL FEM-POP BYPASS GRAFT   triple bypass         Family History  Problem Relation Age of Onset   Kidney disease Mother    Asthma Mother    Hypertension Father     Social History    Tobacco Use   Smoking status: Former    Years: 45.00    Types: Cigarettes   Smokeless tobacco: Never  Vaping Use   Vaping Use: Never used  Substance Use Topics   Alcohol use: Yes    Comment: occ   Drug use: Yes    Types: Marijuana    Comment: last time 12/05/2019    Home Medications Prior to Admission medications   Medication Sig Start Date End Date Taking? Authorizing Provider  allopurinol (ZYLOPRIM) 100 MG tablet Take 0.5 tablets (50 mg total) by mouth daily. 05/04/21 06/03/21  Chauncey Mann, DO  aspirin EC 81 MG EC tablet Take 1 tablet (81 mg total) by mouth daily at 6 (six) AM. Patient not taking: Reported on 04/18/2021 12/10/19   Dara Lords, PA-C  atorvastatin (LIPITOR) 80 MG tablet Take 80 mg by mouth daily. 11/30/20   [provider]  cholecalciferol (VITAMIN D3) 25 MCG (1000 UNIT) tablet Take 1,000 Units by mouth daily.    [provider]  citalopram (CELEXA) 20 MG tablet Take 1 tablet (20 mg total) by mouth daily. 05/04/21 06/03/21  Atway, Derwood Kaplan, DO  diclofenac Sodium (VOLTAREN) 1 % GEL Apply 2 g topically 4 (four) times daily as needed (As needed for L heel pain). 05/04/21   Atway, Rayann N, DO  furosemide (LASIX) 40 MG tablet Take 40 mg by mouth daily. 02/06/21   [provider]  gabapentin (NEURONTIN) 300 MG capsule Take 300 mg by mouth 3 (three) times daily.    [provider]  HYDROcodone-acetaminophen (NORCO) 10-325 MG tablet Take 1 tablet by mouth every 6 (six) hours as needed for moderate pain. 05/03/21   Ellison Carwin, MD  isosorbide mononitrate (IMDUR) 30 MG 24 hr tablet Take 0.5 tablets (15 mg total) by mouth daily. Patient not taking: Reported on 04/18/2021 12/23/19   Orpah [facility], MD  lisinopril (ZESTRIL) 40 MG tablet Take 40 mg by mouth daily. 04/09/21   [provider]  nitroGLYCERIN (NITROLINGUAL) 0.4 MG/SPRAY spray Place 1 spray under the tongue every 5 (five) minutes as needed for chest pain. Patient not taking:  Reported on 04/18/2021 12/22/19   Orpah [facility], MD  pantoprazole (PROTONIX) 40 MG tablet Take 1 tablet (40 mg total) by mouth daily. Patient not taking: Reported on 04/18/2021 12/10/19   Sherren Kerns, MD  traZODone (DESYREL) 100 MG tablet Take 1 tablet (100 mg total) by mouth at bedtime as needed for sleep. 05/04/21 06/03/21  Chauncey Mann, DO    Allergies    Other and Penicillins  Review of Systems   Review of Systems  Constitutional:  Negative for chills and fever.  HENT:  Negative for ear pain and sore throat.   Eyes:  Negative for pain and visual disturbance.  Respiratory:  Negative for cough and shortness of breath.   Cardiovascular:  Negative for chest pain and palpitations.  Gastrointestinal:  Negative for abdominal pain and vomiting.  Genitourinary:  Negative for dysuria and hematuria.  Musculoskeletal:  Negative  for arthralgias and back pain.  Skin:  Negative for color change and rash.  Neurological:  Negative for seizures and syncope.  All other systems reviewed and are negative.  Physical Exam Updated Vital Signs BP (!) 164/84 (BP Location: Right Arm)   Pulse 72   Temp 98.7 F (37.1 C) (Oral)   Resp 19   SpO2 100%   Physical Exam Vitals and nursing note reviewed.  Constitutional:      General: He is not in acute distress.    Appearance: Normal appearance. He is well-developed.  HENT:     Head: Normocephalic and atraumatic.     Right Ear: External ear normal.     Left Ear: External ear normal.     Nose: Nose normal. No congestion.     Mouth/Throat:     Mouth: Mucous membranes are moist.     Pharynx: Oropharynx is clear. No posterior oropharyngeal erythema.  Eyes:     Extraocular Movements: Extraocular movements intact.     Conjunctiva/sclera: Conjunctivae normal.     Pupils: Pupils are equal, round, and reactive to light.  Cardiovascular:     Rate and Rhythm: Normal rate and regular rhythm.     Pulses: Normal pulses.     Heart sounds: No murmur  heard. Pulmonary:     Effort: Pulmonary effort is normal. No respiratory distress.     Breath sounds: Normal breath sounds. No wheezing, rhonchi or rales.  Abdominal:     General: Abdomen is flat. Bowel sounds are normal.     Palpations: Abdomen is soft.     Tenderness: There is no abdominal tenderness. There is no guarding or rebound.  Musculoskeletal:        General: Swelling and tenderness (Moderate tenderness to palpation of left anterior knee with some mild overlying erythema.  Mild associated swelling.  Limited range of motion secondary to pain.  Neurovascularly intact distally.) present. No deformity. Normal range of motion.     Cervical back: Normal range of motion and neck supple. No rigidity.  Skin:    General: Skin is warm and dry.     Capillary Refill: Capillary refill takes less than 2 seconds.     Findings: No rash.  Neurological:     General: No focal deficit present.     Mental Status: He is alert and oriented to person, place, and time.  Psychiatric:        Mood and Affect: Mood normal.    ED Results / Procedures / Treatments   Labs (all labs ordered are listed, but only abnormal results are displayed) Labs Reviewed  SYNOVIAL CELL COUNT + DIFF, W/ CRYSTALS - Abnormal; Notable for the following components:      Result Value   Color, Synovial RED (*)    Appearance-Synovial CLOUDY (*)    Neutrophil, Synovial 54 (*)    Lymphocytes-Synovial Fld 31 (*)    Monocyte-Macrophage-Synovial Fluid 13 (*)    Eosinophils-Synovial 2 (*)    All other components within normal limits  BASIC METABOLIC PANEL - Abnormal; Notable for the following components:   Potassium 5.3 (*)    Creatinine, Ser 1.30 (*)    GFR, Estimated 59 (*)    All other components within normal limits  URIC ACID - Abnormal; Notable for the following components:   Uric Acid, Serum 8.7 (*)    All other components within normal limits  CBC WITH DIFFERENTIAL/PLATELET  SEDIMENTATION RATE  C-REACTIVE PROTEIN     EKG None  Radiology DG  Knee Complete 4 Views Left  Result Date: 08/18/2021 CLINICAL DATA:  Left knee pain for 1 week, difficulty ambulating EXAM: LEFT KNEE - COMPLETE 4+ VIEW COMPARISON:  None. FINDINGS: Frontal, bilateral oblique, lateral views of the left knee are obtained. No fracture, subluxation, or dislocation. Joint spaces are well preserved. No joint effusion. IMPRESSION: 1. Unremarkable left knee. Electronically Signed   By: Randa Ngo M.D.   On: 08/18/2021 17:48    Procedures .Joint Aspiration/Arthrocentesis  Date/Time: 08/18/2021 11:26 PM Performed by: Idamae Lusher, MD Authorized by: Lucrezia Starch, MD   Consent:    Consent obtained:  Verbal   Consent given by:  Patient   Risks, benefits, and alternatives were discussed: yes     Risks discussed:  Bleeding, infection, pain, incomplete drainage and nerve damage   Alternatives discussed:  No treatment Universal protocol:    Procedure explained and questions answered to patient or proxy's satisfaction: yes     Relevant documents present and verified: yes     Immediately prior to procedure, a time out was called: yes     Patient identity confirmed:  Verbally with patient, hospital-assigned identification number and arm band Location:    Location:  Knee   Knee:  L knee Anesthesia:    Anesthesia method:  Local infiltration Procedure details:    Preparation: Patient was prepped and draped in usual sterile fashion     Needle gauge:  18 G   Ultrasound guidance: no     Approach:  Lateral   Aspirate amount:  2   Aspirate characteristics:  Clear   Steroid injected: no   Post-procedure details:    Dressing:  Adhesive bandage   Procedure completion:  Tolerated well, no immediate complications   Medications Ordered in ED Medications  lidocaine (PF) (XYLOCAINE) 1 % injection 10 mL (10 mLs Intradermal Given 08/18/21 1957)  HYDROcodone-acetaminophen (NORCO/VICODIN) 5-325 MG per tablet 1 tablet (1 tablet Oral  Given 08/18/21 1956)    ED Course  I have reviewed the triage vital signs and the nursing notes.  Pertinent labs & imaging results that were available during my care of the patient were reviewed by me and considered in my medical decision making (see chart for details).    MDM Rules/Calculators/A&P                          70 year old male with prior CVA and residual left-sided deficits and history of gout who is presenting with atraumatic left knee pain.  Exam as above.  He is afebrile and hemodynamically stable.  Nontoxic-appearing.  X-ray showed no acute fracture dislocations.  CBC revealed normal white count.  His inflammatory markers are normal.  His uric acid is mildly elevated, but his synovial fluid showed no crystals and a normal WBC count.  Low concern for septic arthritis or gout.  Will place in Ace wrap and have follow-up with PCP for reassessment.  Symptomatic management discussed with Tylenol Motrin, RICE therapy.  Strict return ED precautions provided.  Final Clinical Impression(s) / ED Diagnoses Final diagnoses:  Acute pain of left knee    Rx / DC Orders ED Discharge Orders     None        Idamae Lusher, MD 08/18/21 [id number]    Lucrezia Starch, MD 08/19/21 [phone]

## 2021-08-18 NOTE — ED Triage Notes (Signed)
Pt reports stroke in August with L sided deficits.  Reports L knee pain x 1 week with difficulty ambulating to bathroom.  No known injury.

## 2021-08-18 NOTE — Discharge Instructions (Addendum)
Please follow-up with your PCP to discuss your ongoing knee pain.  Your lab work today it shows no signs of gout or any infection.  Recommend rest, ice, compression, elevation.  May take Tylenol Motrin as needed at home for pain.  Return to the ED with any worsening pain, redness, swelling, fevers.

## 2021-08-20 ENCOUNTER — Encounter (HOSPITAL_COMMUNITY): Payer: Self-pay | Admitting: Emergency Medicine

## 2021-08-20 ENCOUNTER — Emergency Department (HOSPITAL_COMMUNITY)
Admission: EM | Admit: 2021-08-20 | Discharge: 2021-08-21 | Disposition: A | Discharge status: Other Acute Inpatient Hospital | Payer: Medicare HMO | Attending: Emergency Medicine | Admitting: Emergency Medicine

## 2021-08-20 ENCOUNTER — Other Ambulatory Visit: Payer: Self-pay

## 2021-08-20 DIAGNOSIS — Z79899 Other long term (current) drug therapy: Secondary | ICD-10-CM | POA: Diagnosis not present

## 2021-08-20 DIAGNOSIS — Y900 Blood alcohol level of less than 20 mg/100 ml: Secondary | ICD-10-CM | POA: Insufficient documentation

## 2021-08-20 DIAGNOSIS — N183 Chronic kidney disease, stage 3 unspecified: Secondary | ICD-10-CM | POA: Diagnosis not present

## 2021-08-20 DIAGNOSIS — F332 Major depressive disorder, recurrent severe without psychotic features: Secondary | ICD-10-CM | POA: Insufficient documentation

## 2021-08-20 DIAGNOSIS — I5041 Acute combined systolic (congestive) and diastolic (congestive) heart failure: Secondary | ICD-10-CM | POA: Diagnosis not present

## 2021-08-20 DIAGNOSIS — R45851 Suicidal ideations: Secondary | ICD-10-CM | POA: Diagnosis not present

## 2021-08-20 DIAGNOSIS — Z951 Presence of aortocoronary bypass graft: Secondary | ICD-10-CM | POA: Insufficient documentation

## 2021-08-20 DIAGNOSIS — Z20822 Contact with and (suspected) exposure to covid-19: Secondary | ICD-10-CM | POA: Insufficient documentation

## 2021-08-20 DIAGNOSIS — I130 Hypertensive heart and chronic kidney disease with heart failure and stage 1 through stage 4 chronic kidney disease, or unspecified chronic kidney disease: Secondary | ICD-10-CM | POA: Insufficient documentation

## 2021-08-20 DIAGNOSIS — I2510 Atherosclerotic heart disease of native coronary artery without angina pectoris: Secondary | ICD-10-CM | POA: Diagnosis not present

## 2021-08-20 DIAGNOSIS — Z87891 Personal history of nicotine dependence: Secondary | ICD-10-CM | POA: Insufficient documentation

## 2021-08-20 DIAGNOSIS — Z046 Encounter for general psychiatric examination, requested by authority: Secondary | ICD-10-CM | POA: Diagnosis present

## 2021-08-20 LAB — CBC WITH DIFFERENTIAL/PLATELET
Abs Immature Granulocytes: 0.03 10*3/uL (ref 0.00–0.07)
Basophils Absolute: 0 10*3/uL (ref 0.0–0.1)
Basophils Relative: 0 %
Eosinophils Absolute: 0 10*3/uL (ref 0.0–0.5)
Eosinophils Relative: 1 %
HCT: 44.2 % (ref 39.0–52.0)
Hemoglobin: 13.8 g/dL (ref 13.0–17.0)
Immature Granulocytes: 1 %
Lymphocytes Relative: 37 %
Lymphs Abs: 2.2 10*3/uL (ref 0.7–4.0)
MCH: 26.8 pg (ref 26.0–34.0)
MCHC: 31.2 g/dL (ref 30.0–36.0)
MCV: 85.8 fL (ref 80.0–100.0)
Monocytes Absolute: 0.7 10*3/uL (ref 0.1–1.0)
Monocytes Relative: 11 %
Neutro Abs: 3 10*3/uL (ref 1.7–7.7)
Neutrophils Relative %: 50 %
Platelets: 178 10*3/uL (ref 150–400)
RBC: 5.15 MIL/uL (ref 4.22–5.81)
RDW: 14.2 % (ref 11.5–15.5)
WBC: 6 10*3/uL (ref 4.0–10.5)
nRBC: 0 % (ref 0.0–0.2)

## 2021-08-20 LAB — ETHANOL: Alcohol, Ethyl (B): <10 mg/dL (ref <10)

## 2021-08-20 LAB — COMPREHENSIVE METABOLIC PANEL
ALT: 32 U/L (ref 0–44)
AST: 26 U/L (ref 15–41)
Albumin: 3.7 g/dL (ref 3.5–5.0)
Alkaline Phosphatase: 129 U/L — ABNORMAL HIGH (ref 38–126)
Anion gap: 8 (ref 5–15)
BUN: 19 mg/dL (ref 8–23)
CO2: 24 mmol/L (ref 22–32)
Calcium: 8.9 mg/dL (ref 8.9–10.3)
Chloride: 106 mmol/L (ref 98–111)
Creatinine, Ser: 1.34 mg/dL — ABNORMAL HIGH (ref 0.61–1.24)
GFR, Estimated: 57 mL/min — ABNORMAL LOW (ref >60)
Glucose, Bld: 100 mg/dL — ABNORMAL HIGH (ref 70–99)
Potassium: 3.8 mmol/L (ref 3.5–5.1)
Sodium: 138 mmol/L (ref 135–145)
Total Bilirubin: 0.4 mg/dL (ref 0.3–1.2)
Total Protein: 6.7 g/dL (ref 6.5–8.1)

## 2021-08-20 LAB — RAPID URINE DRUG SCREEN, HOSP PERFORMED
Amphetamines: NOT DETECTED
Barbiturates: NOT DETECTED
Benzodiazepines: NOT DETECTED
Cocaine: POSITIVE — AB
Opiates: NOT DETECTED
Tetrahydrocannabinol: NOT DETECTED

## 2021-08-20 LAB — SALICYLATE LEVEL: Salicylate Lvl: <7 mg/dL — ABNORMAL LOW (ref 7.0–30.0)

## 2021-08-20 LAB — ACETAMINOPHEN LEVEL: Acetaminophen (Tylenol), Serum: <10 ug/mL — ABNORMAL LOW (ref 10–30)

## 2021-08-20 LAB — RESP PANEL BY RT-PCR (FLU A&B, COVID) ARPGX2
Influenza A by PCR: NEGATIVE
Influenza B by PCR: NEGATIVE
SARS Coronavirus 2 by RT PCR: NEGATIVE

## 2021-08-20 MED ORDER — ATORVASTATIN CALCIUM 40 MG PO TABS
80.0000 mg | ORAL_TABLET | Freq: Every day | ORAL | Status: DC
  Administered 2021-08-20 – 2021-08-21 (×2): 80 mg via ORAL
  Filled 2021-08-20 (×2): qty 2

## 2021-08-20 MED ORDER — ASPIRIN 81 MG PO TBEC: 81.0000 mg | DELAYED_RELEASE_TABLET | Freq: Every day | ORAL | Status: DC

## 2021-08-20 MED ORDER — CALCIUM CARBONATE ANTACID 500 MG PO CHEW
1.0000 | CHEWABLE_TABLET | Freq: Four times a day (QID) | ORAL | Status: DC | PRN
  Administered 2021-08-20 – 2021-08-21 (×3): 200 mg via ORAL
  Filled 2021-08-20 (×3): qty 1

## 2021-08-20 MED ORDER — ASPIRIN EC 81 MG PO TBEC
81.0000 mg | DELAYED_RELEASE_TABLET | Freq: Every day | ORAL | Status: DC
  Administered 2021-08-21: 81 mg via ORAL
  Filled 2021-08-20: qty 1

## 2021-08-20 MED ORDER — GABAPENTIN 300 MG PO CAPS
300.0000 mg | ORAL_CAPSULE | Freq: Three times a day (TID) | ORAL | Status: DC
  Administered 2021-08-20 – 2021-08-21 (×2): 300 mg via ORAL
  Filled 2021-08-20 (×2): qty 1

## 2021-08-20 MED ORDER — LISINOPRIL 10 MG PO TABS
40.0000 mg | ORAL_TABLET | Freq: Every day | ORAL | Status: DC
  Administered 2021-08-20 – 2021-08-21 (×2): 40 mg via ORAL
  Filled 2021-08-20 (×2): qty 4

## 2021-08-20 MED ORDER — FUROSEMIDE 40 MG PO TABS
40.0000 mg | ORAL_TABLET | Freq: Every day | ORAL | Status: DC
  Administered 2021-08-20 – 2021-08-21 (×2): 40 mg via ORAL
  Filled 2021-08-20 (×2): qty 1

## 2021-08-20 NOTE — BH Assessment (Signed)
Clinician reviewed pt's chart in preparation to complete his MH Assessmet. However, pt is not yet medically cleared (as of 2050). TTS to attempt assessment at a later time.

## 2021-08-20 NOTE — ED Notes (Signed)
Pt changed into burgundy scrubs. 2 pt bags in cabinet labeled 16-18   RESA

## 2021-08-20 NOTE — ED Provider Notes (Signed)
[facility]-EMERGENCY DEPT Provider Note   CSN: [id number] Arrival date & time: 08/20/21  1728     History Chief Complaint  Patient presents with   Suicidal    Marcus [name] is a 70 y.o. male who presents emergency department with a chief complaint of feeling suicidal.  Patient states that he has had suicidal ideation on and off "for a while."  Patient states that he began feeling suicidal when he "could not get no help for my leg pain."  Patient states that he does not have any pain medications.  He states that he has been using cocaine to try the take care of the pain.  He states that he was hoping to find fentanyl on the street so he could just overdose and die.  He denies use of other drugs.  He denies homicidal ideation or audiovisual hallucinations.  HPI     Past Medical History:  Diagnosis Date   Arthritis    Cervical spine disease    CHF (congestive heart failure) (HCC)    Depression    GERD (gastroesophageal reflux disease)    Headache    migraines in the "80's"   Hyperlipidemia    Hypertension    Myocardial infarction [facility])    Peripheral vascular disease (HCC)    Stroke (HCC)    x 2    Patient Active Problem List   Diagnosis Date Noted   Moderate episode of recurrent major depressive disorder (HCC)    Acute CVA (cerebrovascular accident) (HCC) 04/19/2021   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   PAD (peripheral artery disease) (HCC) 12/07/2019   Peripheral vascular disease (HCC) 12/07/2019   Acute combined systolic and diastolic CHF, NYHA class 3 (HCC) 07/16/2016   Acute cerebral infarction (HCC) 07/16/2016   Benign essential HTN 07/16/2016   HLD (hyperlipidemia) 07/16/2016   Non compliance w medication regimen 07/16/2016   CKD (chronic kidney disease) stage 3, GFR 30-59 ml/min (HCC) 07/16/2016   Hypertension 03/11/2011   CAD (coronary artery disease) 03/11/2011    Past Surgical History:  Procedure Laterality Date   ABDOMINAL AORTOGRAM W/LOWER  EXTREMITY Bilateral 09/15/2018   Procedure: ABDOMINAL AORTOGRAM W/LOWER EXTREMITY;  Surgeon: Nada Libman, MD;  Location: [facility] INVASIVE CV LAB;  Service: Cardiovascular;  Laterality: Bilateral;   ABDOMINAL AORTOGRAM W/LOWER EXTREMITY N/A 11/26/2019   Procedure: ABDOMINAL AORTOGRAM W/LOWER EXTREMITY;  Surgeon: Sherren Kerns, MD;  Location: [facility] INVASIVE CV LAB;  Service: Cardiovascular;  Laterality: N/A;   breast lump removal     BYPASS GRAFT  12/07/2019   RIGHT FEMORAL-ABOVE KNEE POPLITEAL ARTERY BYPASS GRAFT USING PROPATEN GRAFT (Right Leg Upper   CORONARY ARTERY BYPASS GRAFT     FEMORAL-POPLITEAL BYPASS GRAFT Right 12/07/2019   Procedure: RIGHT FEMORAL-ABOVE KNEE POPLITEAL ARTERY BYPASS GRAFT USING PROPATEN GRAFT;  Surgeon: Sherren Kerns, MD;  Location: [facility] OR;  Service: Vascular;  Laterality: Right;   INTRAOPERATIVE ARTERIOGRAM Right 12/07/2019   Procedure: Intra Operative Arteriogram;  Surgeon: Sherren Kerns, MD;  Location: [facility] OR;  Service: Vascular;  Laterality: Right;   LEFT HEART CATHETERIZATION WITH CORONARY/GRAFT ANGIOGRAM Right 05/01/2012   Procedure: LEFT HEART CATHETERIZATION WITH Isabel Caprice;  Surgeon: Ricki Rodriguez, MD;  Location: [facility] CATH LAB;  Service: Cardiovascular;  Laterality: Right;   LOWER EXTREMITY ANGIOGRAPHY Right 05/25/2020   Procedure: LOWER EXTREMITY ANGIOGRAPHY;  Surgeon: Cephus Shelling, MD;  Location: [facility] INVASIVE CV LAB;  Service: Cardiovascular;  Laterality: Right;   LOWER EXTREMITY ANGIOGRAPHY  05/26/2020  Procedure: Lower Extremity Angiography;  Surgeon: Maeola Harman, MD;  Location: [facility] INVASIVE CV LAB;  Service: Cardiovascular;;   PERIPHERAL VASCULAR INTERVENTION Right 05/26/2020   Procedure: PERIPHERAL VASCULAR INTERVENTION;  Surgeon: Maeola Harman, MD;  Location: [facility] INVASIVE CV LAB;  Service: Cardiovascular;  Laterality: Right;  PERONEAL FEM-POP BYPASS GRAFT   triple bypass         Family History  Problem  Relation Age of Onset   Kidney disease Mother    Asthma Mother    Hypertension Father     Social History   Tobacco Use   Smoking status: Former    Years: 45.00    Types: Cigarettes   Smokeless tobacco: Never  Vaping Use   Vaping Use: Never used  Substance Use Topics   Alcohol use: Yes    Comment: occ   Drug use: Yes    Types: Marijuana    Comment: last time 12/05/2019    Home Medications Prior to Admission medications   Medication Sig Start Date End Date Taking? Authorizing Provider  cholecalciferol (VITAMIN D3) 25 MCG (1000 UNIT) tablet Take 1,000 Units by mouth daily.   Yes [provider]  gabapentin (NEURONTIN) 300 MG capsule Take 300 mg by mouth 3 (three) times daily as needed (pain).   Yes [provider]  lisinopril (ZESTRIL) 40 MG tablet Take 40 mg by mouth daily. 04/09/21  Yes [provider]  simvastatin (ZOCOR) 40 MG tablet Take 40 mg by mouth at bedtime. 07/27/21  Yes [provider]  allopurinol (ZYLOPRIM) 100 MG tablet Take 0.5 tablets (50 mg total) by mouth daily. Patient not taking: Reported on 08/20/2021 05/04/21 06/03/21  Chauncey Mann, DO  aspirin EC 81 MG EC tablet Take 1 tablet (81 mg total) by mouth daily at 6 (six) AM. Patient not taking: Reported on 04/18/2021 12/10/19   Dara Lords, PA-C  atorvastatin (LIPITOR) 80 MG tablet Take 80 mg by mouth daily. Patient not taking: Reported on 08/20/2021 11/30/20   [provider]  citalopram (CELEXA) 20 MG tablet Take 1 tablet (20 mg total) by mouth daily. 05/04/21 06/03/21  Atway, Derwood Kaplan, DO  diclofenac Sodium (VOLTAREN) 1 % GEL Apply 2 g topically 4 (four) times daily as needed (As needed for L heel pain). Patient not taking: Reported on 08/20/2021 05/04/21   Atway, Rayann N, DO  furosemide (LASIX) 40 MG tablet Take 40 mg by mouth daily. Patient not taking: Reported on 08/20/2021 02/06/21   [provider]  HYDROcodone-acetaminophen (NORCO) 10-325 MG tablet  Take 1 tablet by mouth every 6 (six) hours as needed for moderate pain. Patient not taking: Reported on 08/20/2021 05/03/21   Ellison Carwin, MD  isosorbide mononitrate (IMDUR) 30 MG 24 hr tablet Take 0.5 tablets (15 mg total) by mouth daily. Patient not taking: Reported on 08/20/2021 12/23/19   Orpah Cobb, MD  nitroGLYCERIN (NITROLINGUAL) 0.4 MG/SPRAY spray Place 1 spray under the tongue every 5 (five) minutes as needed for chest pain. Patient not taking: Reported on 08/20/2021 12/22/19   Orpah Cobb, MD  omeprazole (PRILOSEC) 20 MG capsule Take 20 mg by mouth daily. Patient not taking: Reported on 08/20/2021 07/09/21   [provider]  pantoprazole (PROTONIX) 40 MG tablet Take 1 tablet (40 mg total) by mouth daily. Patient not taking: Reported on 08/20/2021 12/10/19   Sherren Kerns, MD  traZODone (DESYREL) 100 MG tablet Take 1 tablet (100 mg total) by mouth at bedtime as needed for sleep. Patient not  taking: Reported on 08/20/2021 05/04/21 06/03/21  Dorethea Clan, DO    Allergies    Other and Penicillins  Review of Systems   Review of Systems Ten systems reviewed and are negative for acute change, except as noted in the HPI.   Physical Exam Updated Vital Signs BP (!) 165/99 (BP Location: Left Arm)   Pulse 82   Temp 98 F (36.7 C) (Oral)   Resp 19   SpO2 95%   Physical Exam Vitals and nursing note reviewed.  Constitutional:      General: He is not in acute distress.    Appearance: He is well-developed. He is not diaphoretic.  HENT:     Head: Normocephalic and atraumatic.  Eyes:     General: No scleral icterus.    Conjunctiva/sclera: Conjunctivae normal.  Cardiovascular:     Rate and Rhythm: Normal rate and regular rhythm.     Heart sounds: Normal heart sounds.  Pulmonary:     Effort: Pulmonary effort is normal. No respiratory distress.     Breath sounds: Normal breath sounds.  Abdominal:     Palpations: Abdomen is soft.     Tenderness: There is no  abdominal tenderness.  Musculoskeletal:     Cervical back: Normal range of motion and neck supple.  Skin:    General: Skin is warm and dry.  Neurological:     Mental Status: He is alert.  Psychiatric:        Behavior: Behavior normal.    ED Results / Procedures / Treatments   Labs (all labs ordered are listed, but only abnormal results are displayed) Labs Reviewed  COMPREHENSIVE METABOLIC PANEL - Abnormal; Notable for the following components:      Result Value   Glucose, Bld 100 (*)    Creatinine, Ser 1.34 (*)    Alkaline Phosphatase 129 (*)    GFR, Estimated 57 (*)    All other components within normal limits  RAPID URINE DRUG SCREEN, HOSP PERFORMED - Abnormal; Notable for the following components:   Cocaine POSITIVE (*)    All other components within normal limits  ACETAMINOPHEN LEVEL - Abnormal; Notable for the following components:   Acetaminophen (Tylenol), Serum <10 (*)    All other components within normal limits  SALICYLATE LEVEL - Abnormal; Notable for the following components:   Salicylate Lvl [id number] (*)    All other components within normal limits  RESP PANEL BY RT-PCR (FLU A&B, COVID) ARPGX2  ETHANOL  CBC WITH DIFFERENTIAL/PLATELET    EKG None  Radiology No results found.  Procedures Procedures   Medications Ordered in ED Medications  atorvastatin (LIPITOR) tablet 80 mg (80 mg Oral Given 08/20/21 1849)  furosemide (LASIX) tablet 40 mg (40 mg Oral Given 08/20/21 1849)  gabapentin (NEURONTIN) capsule 300 mg (300 mg Oral Given 08/20/21 2152)  lisinopril (ZESTRIL) tablet 40 mg (40 mg Oral Given 08/20/21 1849)  aspirin EC tablet 81 mg (has no administration in time range)  calcium carbonate (TUMS - dosed in mg elemental calcium) chewable tablet 200 mg of elemental calcium (200 mg of elemental calcium Oral Given 08/20/21 2023)    ED Course  I have reviewed the triage vital signs and the nursing notes.  Pertinent labs & imaging results that were available  during my care of the patient were reviewed by me and considered in my medical decision making (see chart for details).    MDM Rules/Calculators/A&P  Patient here with suicidal ideation with plan to overdose.  This seems situational however may also be due to substance induced mood disorder given his use of cocaine.  Patient medically clear Final Clinical Impression(s) / ED Diagnoses Final diagnoses:  Suicidal ideation    Rx / DC Orders ED Discharge Orders     None        Margarita Mail, PA-C 08/20/21 2259    Varney Biles, MD 08/21/21 2109

## 2021-08-20 NOTE — ED Triage Notes (Signed)
BIB Police. Per patient he is suicidal with a plan. Pt states he did cocaine and thought it was fentanyl. Pt reports having a lot of issues this year and feeling depressed.

## 2021-08-21 DIAGNOSIS — F332 Major depressive disorder, recurrent severe without psychotic features: Secondary | ICD-10-CM | POA: Diagnosis not present

## 2021-08-21 LAB — URINALYSIS, ROUTINE W REFLEX MICROSCOPIC
Bilirubin Urine: NEGATIVE
Glucose, UA: NEGATIVE mg/dL
Hgb urine dipstick: NEGATIVE
Ketones, ur: NEGATIVE mg/dL
Leukocytes,Ua: NEGATIVE
Nitrite: NEGATIVE
Protein, ur: NEGATIVE mg/dL
Specific Gravity, Urine: 1.025 (ref 1.005–1.030)
pH: 5 (ref 5.0–8.0)

## 2021-08-21 MED ORDER — ACETAMINOPHEN 325 MG PO TABS
650.0000 mg | ORAL_TABLET | Freq: Four times a day (QID) | ORAL | Status: DC | PRN
  Administered 2021-08-21: 650 mg via ORAL
  Filled 2021-08-21: qty 2

## 2021-08-21 NOTE — BH Assessment (Signed)
BHH Assessment Progress Note   Per Melbourne Abts, PA, this pt requires psychiatric hospitalization at this time.  At 13:19 Baird Lyons calls from [facility].  Pt has been accepted to their facility by Claris Che, PA to Rm 637.  Caryn Bee, NP, concurs with this disposition, as does the pt who is currently under voluntary status.  EDP Glendora Score, MD and pt's nurse, Ladona Ridgel, have been notified, and Ladona Ridgel agrees to call report to [phone].  Pt is to be transported via [employer].  Doylene Canning, [place] [facility] Coordinator [phone]

## 2021-08-21 NOTE — BH Assessment (Addendum)
BHH Assessment Progress Note   Per Melbourne Abts, PA, this voluntary pt requires psychiatric hospitalization at a facility providing specialty care for geriatric patients at this time.  [facility] will not have a suitable bed for this pt today.  At the direction of Nelly Rout, MD this writer has sought placement for this patient at facilities outside of the [facility] system.  The following facilities have been contacted to seek placement for this pt, with results as noted:  Beds available, information sent, decision pending: [name]  Unable to reach: [facility] (left message at 11:09)  At capacity: [facility]  If this voluntary pt is accepted to a facility, please discuss disposition with pt to be sure that he agrees to the plan.  If a facility agrees to accept pt and the plan changes in any way please call the facility to inform them of the change.  Final disposition is pending as of this writing.  Doylene Canning, [place] [facility] Coordinator [phone]

## 2021-08-21 NOTE — ED Notes (Signed)
[facility] contacted for transport to facility, ride arranged.

## 2021-08-21 NOTE — ED Notes (Addendum)
Report called to Uvaldo Bristle, RN at [facility].
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
## 2021-08-21 NOTE — ED Notes (Signed)
Attempted to call [employer] for transportation, no response.
